# Patient Record
Sex: Female | Born: 1950
Health system: Southern US, Community
[De-identification: ages and names within clinical notes are randomized; demographics above are authoritative.]

## PROBLEM LIST (undated history)

## (undated) DIAGNOSIS — M1990 Unspecified osteoarthritis, unspecified site: Secondary | ICD-10-CM

## (undated) DIAGNOSIS — F32A Depression, unspecified: Secondary | ICD-10-CM

## (undated) DIAGNOSIS — I2510 Atherosclerotic heart disease of native coronary artery without angina pectoris: Secondary | ICD-10-CM

## (undated) DIAGNOSIS — H269 Unspecified cataract: Secondary | ICD-10-CM

## (undated) DIAGNOSIS — E785 Hyperlipidemia, unspecified: Secondary | ICD-10-CM

## (undated) DIAGNOSIS — Z5189 Encounter for other specified aftercare: Secondary | ICD-10-CM

## (undated) DIAGNOSIS — F329 Major depressive disorder, single episode, unspecified: Secondary | ICD-10-CM

## (undated) DIAGNOSIS — D509 Iron deficiency anemia, unspecified: Secondary | ICD-10-CM

## (undated) DIAGNOSIS — I10 Essential (primary) hypertension: Secondary | ICD-10-CM

## (undated) DIAGNOSIS — I341 Nonrheumatic mitral (valve) prolapse: Secondary | ICD-10-CM

## (undated) HISTORY — DX: Nonrheumatic mitral (valve) prolapse: I34.1

## (undated) HISTORY — DX: Iron deficiency anemia, unspecified: D50.9

## (undated) HISTORY — PX: PARTIAL HYSTERECTOMY: SHX80

## (undated) HISTORY — DX: Unspecified osteoarthritis, unspecified site: M19.90

## (undated) HISTORY — DX: Unspecified cataract: H26.9

## (undated) HISTORY — DX: Hyperlipidemia, unspecified: E78.5

## (undated) HISTORY — PX: ABDOMINAL HYSTERECTOMY: SHX81

## (undated) HISTORY — PX: COLONOSCOPY: SHX174

## (undated) HISTORY — DX: Essential (primary) hypertension: I10

## (undated) HISTORY — PX: CARDIAC CATHETERIZATION: SHX172

## (undated) HISTORY — DX: Encounter for other specified aftercare: Z51.89

## (undated) HISTORY — DX: Depression, unspecified: F32.A

## (undated) HISTORY — DX: Major depressive disorder, single episode, unspecified: F32.9

## (undated) HISTORY — PX: CATARACT EXTRACTION, BILATERAL: SHX1313

---

## 1998-03-08 ENCOUNTER — Ambulatory Visit (HOSPITAL_BASED_OUTPATIENT_CLINIC_OR_DEPARTMENT_OTHER): Admission: RE | Admit: 1998-03-08 | Discharge: 1998-03-08 | Payer: Self-pay | Admitting: Ophthalmology

## 1999-08-05 ENCOUNTER — Emergency Department (HOSPITAL_COMMUNITY): Admission: EM | Admit: 1999-08-05 | Discharge: 1999-08-05 | Payer: Self-pay | Admitting: Emergency Medicine

## 1999-08-06 ENCOUNTER — Encounter: Payer: Self-pay | Admitting: Emergency Medicine

## 1999-08-07 ENCOUNTER — Encounter: Admission: RE | Admit: 1999-08-07 | Discharge: 1999-08-07 | Payer: Self-pay | Admitting: Family Medicine

## 1999-10-29 ENCOUNTER — Encounter (INDEPENDENT_AMBULATORY_CARE_PROVIDER_SITE_OTHER): Payer: Self-pay | Admitting: *Deleted

## 1999-10-29 LAB — CONVERTED CEMR LAB

## 1999-11-13 ENCOUNTER — Encounter: Admission: RE | Admit: 1999-11-13 | Discharge: 1999-11-13 | Payer: Self-pay | Admitting: Family Medicine

## 1999-11-26 ENCOUNTER — Encounter: Admission: RE | Admit: 1999-11-26 | Discharge: 1999-11-26 | Payer: Self-pay | Admitting: *Deleted

## 1999-11-26 ENCOUNTER — Encounter: Payer: Self-pay | Admitting: *Deleted

## 2000-01-09 ENCOUNTER — Encounter: Admission: RE | Admit: 2000-01-09 | Discharge: 2000-01-09 | Payer: Self-pay | Admitting: *Deleted

## 2000-01-09 ENCOUNTER — Encounter: Payer: Self-pay | Admitting: *Deleted

## 2000-06-01 ENCOUNTER — Encounter: Admission: RE | Admit: 2000-06-01 | Discharge: 2000-06-01 | Payer: Self-pay | Admitting: Family Medicine

## 2000-06-24 ENCOUNTER — Encounter: Admission: RE | Admit: 2000-06-24 | Discharge: 2000-06-24 | Payer: Self-pay | Admitting: Family Medicine

## 2000-08-06 ENCOUNTER — Encounter: Admission: RE | Admit: 2000-08-06 | Discharge: 2000-08-06 | Payer: Self-pay | Admitting: Family Medicine

## 2002-08-18 ENCOUNTER — Encounter: Admission: RE | Admit: 2002-08-18 | Discharge: 2002-08-18 | Payer: Self-pay | Admitting: Family Medicine

## 2002-11-24 ENCOUNTER — Encounter: Admission: RE | Admit: 2002-11-24 | Discharge: 2002-11-24 | Payer: Self-pay | Admitting: Family Medicine

## 2003-08-08 ENCOUNTER — Encounter: Admission: RE | Admit: 2003-08-08 | Discharge: 2003-08-08 | Payer: Self-pay | Admitting: Family Medicine

## 2003-08-20 ENCOUNTER — Encounter: Admission: RE | Admit: 2003-08-20 | Discharge: 2003-08-20 | Payer: Self-pay | Admitting: Family Medicine

## 2004-04-14 ENCOUNTER — Ambulatory Visit: Payer: Self-pay | Admitting: Family Medicine

## 2004-05-21 ENCOUNTER — Ambulatory Visit: Payer: Self-pay | Admitting: Sports Medicine

## 2004-07-02 ENCOUNTER — Ambulatory Visit: Payer: Self-pay | Admitting: Family Medicine

## 2004-07-14 ENCOUNTER — Ambulatory Visit: Payer: Self-pay | Admitting: Sports Medicine

## 2004-10-02 ENCOUNTER — Ambulatory Visit (HOSPITAL_COMMUNITY): Admission: RE | Admit: 2004-10-02 | Discharge: 2004-10-02 | Payer: Self-pay | Admitting: Family Medicine

## 2004-12-23 ENCOUNTER — Ambulatory Visit (HOSPITAL_COMMUNITY): Admission: RE | Admit: 2004-12-23 | Discharge: 2004-12-23 | Payer: Self-pay | Admitting: Gastroenterology

## 2005-03-05 ENCOUNTER — Emergency Department (HOSPITAL_COMMUNITY): Admission: EM | Admit: 2005-03-05 | Discharge: 2005-03-05 | Payer: Self-pay | Admitting: Emergency Medicine

## 2005-03-09 ENCOUNTER — Ambulatory Visit: Payer: Self-pay | Admitting: Sports Medicine

## 2005-03-13 ENCOUNTER — Encounter: Admission: RE | Admit: 2005-03-13 | Discharge: 2005-06-11 | Payer: Self-pay | Admitting: Sports Medicine

## 2005-03-20 ENCOUNTER — Ambulatory Visit: Payer: Self-pay | Admitting: Sports Medicine

## 2005-04-08 ENCOUNTER — Ambulatory Visit: Payer: Self-pay | Admitting: Family Medicine

## 2005-06-05 ENCOUNTER — Ambulatory Visit: Payer: Self-pay | Admitting: Family Medicine

## 2005-06-06 ENCOUNTER — Ambulatory Visit (HOSPITAL_COMMUNITY): Admission: RE | Admit: 2005-06-06 | Discharge: 2005-06-06 | Payer: Self-pay | Admitting: Sports Medicine

## 2005-06-12 ENCOUNTER — Ambulatory Visit: Payer: Self-pay | Admitting: Sports Medicine

## 2005-08-04 ENCOUNTER — Ambulatory Visit: Payer: Self-pay | Admitting: Family Medicine

## 2005-11-17 ENCOUNTER — Ambulatory Visit: Payer: Self-pay | Admitting: Family Medicine

## 2006-01-14 ENCOUNTER — Ambulatory Visit: Payer: Self-pay | Admitting: Family Medicine

## 2006-01-15 ENCOUNTER — Encounter: Admission: RE | Admit: 2006-01-15 | Discharge: 2006-01-15 | Payer: Self-pay | Admitting: Sports Medicine

## 2006-01-20 ENCOUNTER — Ambulatory Visit: Payer: Self-pay | Admitting: Sports Medicine

## 2006-02-10 ENCOUNTER — Ambulatory Visit: Payer: Self-pay | Admitting: Sports Medicine

## 2006-02-24 ENCOUNTER — Ambulatory Visit: Payer: Self-pay | Admitting: Sports Medicine

## 2006-04-02 ENCOUNTER — Ambulatory Visit: Payer: Self-pay | Admitting: Family Medicine

## 2006-04-13 ENCOUNTER — Ambulatory Visit: Payer: Self-pay | Admitting: Family Medicine

## 2006-04-28 ENCOUNTER — Ambulatory Visit: Payer: Self-pay | Admitting: Family Medicine

## 2006-04-28 ENCOUNTER — Encounter (INDEPENDENT_AMBULATORY_CARE_PROVIDER_SITE_OTHER): Payer: Self-pay | Admitting: Family Medicine

## 2006-04-28 LAB — CONVERTED CEMR LAB
BUN: 14 mg/dL (ref 6–23)
Calcium: 9.5 mg/dL (ref 8.4–10.5)
Chloride: 103 meq/L (ref 96–112)
Creatinine, Ser: 0.95 mg/dL (ref 0.40–1.20)
HDL: 56 mg/dL (ref >39)
Potassium: 4.6 meq/L (ref 3.5–5.3)
Total CHOL/HDL Ratio: 4.4
Total CK: 105 units/L (ref 7–177)
Triglycerides: 122 mg/dL (ref <150)

## 2006-04-30 ENCOUNTER — Ambulatory Visit (HOSPITAL_COMMUNITY): Admission: RE | Admit: 2006-04-30 | Discharge: 2006-04-30 | Payer: Self-pay | Admitting: Sports Medicine

## 2006-05-28 ENCOUNTER — Encounter (INDEPENDENT_AMBULATORY_CARE_PROVIDER_SITE_OTHER): Payer: Self-pay | Admitting: *Deleted

## 2006-06-10 ENCOUNTER — Encounter: Admission: RE | Admit: 2006-06-10 | Discharge: 2006-06-10 | Payer: Self-pay | Admitting: Sports Medicine

## 2006-06-16 ENCOUNTER — Encounter (INDEPENDENT_AMBULATORY_CARE_PROVIDER_SITE_OTHER): Payer: Self-pay | Admitting: Family Medicine

## 2006-06-24 ENCOUNTER — Ambulatory Visit: Payer: Self-pay | Admitting: Sports Medicine

## 2006-06-24 DIAGNOSIS — E7800 Pure hypercholesterolemia, unspecified: Secondary | ICD-10-CM

## 2006-06-24 DIAGNOSIS — I10 Essential (primary) hypertension: Secondary | ICD-10-CM | POA: Insufficient documentation

## 2006-07-13 ENCOUNTER — Telehealth: Payer: Self-pay | Admitting: *Deleted

## 2006-07-15 ENCOUNTER — Encounter (INDEPENDENT_AMBULATORY_CARE_PROVIDER_SITE_OTHER): Payer: Self-pay | Admitting: Family Medicine

## 2006-07-20 ENCOUNTER — Telehealth: Payer: Self-pay | Admitting: *Deleted

## 2006-07-20 ENCOUNTER — Encounter (INDEPENDENT_AMBULATORY_CARE_PROVIDER_SITE_OTHER): Payer: Self-pay | Admitting: Family Medicine

## 2006-07-22 ENCOUNTER — Telehealth (INDEPENDENT_AMBULATORY_CARE_PROVIDER_SITE_OTHER): Payer: Self-pay | Admitting: *Deleted

## 2006-08-04 ENCOUNTER — Encounter (INDEPENDENT_AMBULATORY_CARE_PROVIDER_SITE_OTHER): Payer: Self-pay | Admitting: Family Medicine

## 2006-08-24 ENCOUNTER — Telehealth (INDEPENDENT_AMBULATORY_CARE_PROVIDER_SITE_OTHER): Payer: Self-pay | Admitting: *Deleted

## 2006-08-30 ENCOUNTER — Ambulatory Visit (HOSPITAL_COMMUNITY): Admission: RE | Admit: 2006-08-30 | Discharge: 2006-08-30 | Payer: Self-pay | Admitting: Family Medicine

## 2006-08-30 ENCOUNTER — Encounter (INDEPENDENT_AMBULATORY_CARE_PROVIDER_SITE_OTHER): Payer: Self-pay | Admitting: Family Medicine

## 2006-08-30 ENCOUNTER — Ambulatory Visit: Payer: Self-pay | Admitting: Sports Medicine

## 2006-08-30 DIAGNOSIS — K219 Gastro-esophageal reflux disease without esophagitis: Secondary | ICD-10-CM

## 2006-08-30 LAB — CONVERTED CEMR LAB
Direct LDL: 188 mg/dL — ABNORMAL HIGH
Ferritin: 108 ng/mL (ref 10–291)

## 2006-08-31 ENCOUNTER — Encounter (INDEPENDENT_AMBULATORY_CARE_PROVIDER_SITE_OTHER): Payer: Self-pay | Admitting: Family Medicine

## 2006-09-02 ENCOUNTER — Encounter (INDEPENDENT_AMBULATORY_CARE_PROVIDER_SITE_OTHER): Payer: Self-pay | Admitting: Family Medicine

## 2006-12-27 ENCOUNTER — Encounter (INDEPENDENT_AMBULATORY_CARE_PROVIDER_SITE_OTHER): Payer: Self-pay | Admitting: Family Medicine

## 2006-12-27 ENCOUNTER — Ambulatory Visit: Payer: Self-pay | Admitting: Family Medicine

## 2006-12-27 ENCOUNTER — Telehealth: Payer: Self-pay | Admitting: *Deleted

## 2006-12-27 LAB — CONVERTED CEMR LAB
ALT: 13 units/L (ref 0–35)
AST: 11 units/L (ref 0–37)
Albumin: 4 g/dL (ref 3.5–5.2)
Alkaline Phosphatase: 69 units/L (ref 39–117)
BUN: 16 mg/dL (ref 6–23)
CO2: 23 meq/L (ref 19–32)
Calcium: 9.1 mg/dL (ref 8.4–10.5)
Chloride: 106 meq/L (ref 96–112)
Creatinine, Ser: 0.89 mg/dL (ref 0.40–1.20)
Glucose, Bld: 98 mg/dL (ref 70–99)
HCT: 36.7 % (ref 36.0–46.0)
Hemoglobin: 11.6 g/dL — ABNORMAL LOW (ref 12.0–15.0)
MCHC: 31.6 g/dL (ref 30.0–36.0)
MCV: 69.1 fL — ABNORMAL LOW (ref 78.0–100.0)
Platelets: 444 10*3/uL — ABNORMAL HIGH (ref 150–400)
Potassium: 4.1 meq/L (ref 3.5–5.3)
RBC: 5.31 M/uL — ABNORMAL HIGH (ref 3.87–5.11)
RDW: 16.3 % — ABNORMAL HIGH (ref 11.5–14.0)
Sodium: 140 meq/L (ref 135–145)
Total Bilirubin: 0.4 mg/dL (ref 0.3–1.2)
Total Protein: 6.9 g/dL (ref 6.0–8.3)
WBC: 9.8 10*3/uL (ref 4.0–10.5)

## 2007-01-06 ENCOUNTER — Telehealth: Payer: Self-pay | Admitting: *Deleted

## 2007-01-06 ENCOUNTER — Encounter (INDEPENDENT_AMBULATORY_CARE_PROVIDER_SITE_OTHER): Payer: Self-pay | Admitting: Family Medicine

## 2007-01-17 ENCOUNTER — Telehealth: Payer: Self-pay | Admitting: *Deleted

## 2007-01-18 ENCOUNTER — Ambulatory Visit: Payer: Self-pay | Admitting: Family Medicine

## 2007-01-18 DIAGNOSIS — M549 Dorsalgia, unspecified: Secondary | ICD-10-CM | POA: Insufficient documentation

## 2007-01-20 ENCOUNTER — Telehealth (INDEPENDENT_AMBULATORY_CARE_PROVIDER_SITE_OTHER): Payer: Self-pay | Admitting: Family Medicine

## 2007-02-16 ENCOUNTER — Ambulatory Visit (HOSPITAL_COMMUNITY): Admission: RE | Admit: 2007-02-16 | Discharge: 2007-02-16 | Payer: Self-pay | Admitting: Gastroenterology

## 2007-03-08 ENCOUNTER — Ambulatory Visit: Payer: Self-pay | Admitting: Family Medicine

## 2007-03-08 LAB — CONVERTED CEMR LAB
Glucose, Urine, Semiquant: NEGATIVE
Nitrite: NEGATIVE
Specific Gravity, Urine: 1.025
Urobilinogen, UA: 0.2
pH: 5.5

## 2007-04-06 ENCOUNTER — Encounter (INDEPENDENT_AMBULATORY_CARE_PROVIDER_SITE_OTHER): Payer: Self-pay | Admitting: Family Medicine

## 2007-04-15 ENCOUNTER — Telehealth (INDEPENDENT_AMBULATORY_CARE_PROVIDER_SITE_OTHER): Payer: Self-pay | Admitting: Family Medicine

## 2007-07-26 ENCOUNTER — Ambulatory Visit: Payer: Self-pay | Admitting: Family Medicine

## 2007-08-10 ENCOUNTER — Emergency Department (HOSPITAL_COMMUNITY): Admission: EM | Admit: 2007-08-10 | Discharge: 2007-08-10 | Payer: Self-pay | Admitting: Family Medicine

## 2007-08-10 ENCOUNTER — Telehealth (INDEPENDENT_AMBULATORY_CARE_PROVIDER_SITE_OTHER): Payer: Self-pay | Admitting: *Deleted

## 2007-08-16 ENCOUNTER — Telehealth: Payer: Self-pay | Admitting: *Deleted

## 2007-08-17 ENCOUNTER — Ambulatory Visit: Payer: Self-pay | Admitting: Family Medicine

## 2007-08-24 ENCOUNTER — Encounter: Payer: Self-pay | Admitting: *Deleted

## 2007-08-25 ENCOUNTER — Encounter (INDEPENDENT_AMBULATORY_CARE_PROVIDER_SITE_OTHER): Payer: Self-pay | Admitting: Family Medicine

## 2007-08-25 ENCOUNTER — Ambulatory Visit: Payer: Self-pay | Admitting: Family Medicine

## 2007-08-25 LAB — CONVERTED CEMR LAB
Glucose, Urine, Semiquant: NEGATIVE
Nitrite: NEGATIVE
OCCULT 1: NEGATIVE
OCCULT 3: NEGATIVE
pH: 5

## 2007-10-17 ENCOUNTER — Telehealth: Payer: Self-pay | Admitting: *Deleted

## 2007-10-18 ENCOUNTER — Ambulatory Visit: Payer: Self-pay | Admitting: Family Medicine

## 2007-10-18 ENCOUNTER — Encounter: Payer: Self-pay | Admitting: Family Medicine

## 2007-12-08 ENCOUNTER — Telehealth: Payer: Self-pay | Admitting: *Deleted

## 2007-12-09 ENCOUNTER — Ambulatory Visit: Payer: Self-pay | Admitting: Family Medicine

## 2008-01-04 ENCOUNTER — Encounter: Payer: Self-pay | Admitting: Family Medicine

## 2008-01-04 ENCOUNTER — Ambulatory Visit: Payer: Self-pay | Admitting: Family Medicine

## 2008-01-04 DIAGNOSIS — E669 Obesity, unspecified: Secondary | ICD-10-CM

## 2008-01-04 LAB — CONVERTED CEMR LAB
HDL goal, serum: 40 mg/dL
Pap Smear: NORMAL
VLDL: 19 mg/dL (ref 0–40)

## 2008-01-06 ENCOUNTER — Telehealth: Payer: Self-pay | Admitting: Family Medicine

## 2008-01-17 ENCOUNTER — Telehealth (INDEPENDENT_AMBULATORY_CARE_PROVIDER_SITE_OTHER): Payer: Self-pay | Admitting: *Deleted

## 2008-01-24 ENCOUNTER — Encounter: Admission: RE | Admit: 2008-01-24 | Discharge: 2008-01-24 | Payer: Self-pay | Admitting: Family Medicine

## 2008-01-25 ENCOUNTER — Ambulatory Visit: Payer: Self-pay | Admitting: Family Medicine

## 2008-01-25 DIAGNOSIS — F329 Major depressive disorder, single episode, unspecified: Secondary | ICD-10-CM | POA: Insufficient documentation

## 2008-01-26 ENCOUNTER — Ambulatory Visit: Payer: Self-pay | Admitting: Sports Medicine

## 2008-01-26 DIAGNOSIS — G5600 Carpal tunnel syndrome, unspecified upper limb: Secondary | ICD-10-CM | POA: Insufficient documentation

## 2008-04-23 ENCOUNTER — Telehealth: Payer: Self-pay | Admitting: Family Medicine

## 2008-04-24 ENCOUNTER — Ambulatory Visit (HOSPITAL_COMMUNITY): Admission: RE | Admit: 2008-04-24 | Discharge: 2008-04-24 | Payer: Self-pay | Admitting: Family Medicine

## 2008-04-24 ENCOUNTER — Ambulatory Visit: Payer: Self-pay | Admitting: Family Medicine

## 2008-04-24 ENCOUNTER — Encounter (INDEPENDENT_AMBULATORY_CARE_PROVIDER_SITE_OTHER): Payer: Self-pay | Admitting: Family Medicine

## 2008-04-24 LAB — CONVERTED CEMR LAB
ALT: 15 units/L (ref 0–35)
AST: 12 units/L (ref 0–37)
CO2: 21 meq/L (ref 19–32)
Chloride: 106 meq/L (ref 96–112)
Creatinine, Ser: 0.96 mg/dL (ref 0.40–1.20)
Hemoglobin: 11.9 g/dL — ABNORMAL LOW (ref 12.0–15.0)
MCHC: 30.5 g/dL (ref 30.0–36.0)
Platelets: 423 10*3/uL — ABNORMAL HIGH (ref 150–400)
Potassium: 4.4 meq/L (ref 3.5–5.3)
RBC: 5.6 M/uL — ABNORMAL HIGH (ref 3.87–5.11)
Total Bilirubin: 0.4 mg/dL (ref 0.3–1.2)

## 2008-04-27 ENCOUNTER — Encounter: Payer: Self-pay | Admitting: *Deleted

## 2008-04-30 ENCOUNTER — Encounter (INDEPENDENT_AMBULATORY_CARE_PROVIDER_SITE_OTHER): Payer: Self-pay | Admitting: Family Medicine

## 2008-05-03 ENCOUNTER — Ambulatory Visit (HOSPITAL_COMMUNITY): Admission: RE | Admit: 2008-05-03 | Discharge: 2008-05-03 | Payer: Self-pay | Admitting: Family Medicine

## 2008-05-03 ENCOUNTER — Ambulatory Visit: Payer: Self-pay | Admitting: *Deleted

## 2008-05-03 ENCOUNTER — Encounter (INDEPENDENT_AMBULATORY_CARE_PROVIDER_SITE_OTHER): Payer: Self-pay | Admitting: Family Medicine

## 2008-06-05 ENCOUNTER — Ambulatory Visit: Payer: Self-pay | Admitting: Family Medicine

## 2008-09-12 ENCOUNTER — Ambulatory Visit: Payer: Self-pay | Admitting: Family Medicine

## 2008-09-12 ENCOUNTER — Ambulatory Visit: Payer: Self-pay | Admitting: Sports Medicine

## 2008-09-14 ENCOUNTER — Telehealth: Payer: Self-pay | Admitting: Family Medicine

## 2008-09-19 ENCOUNTER — Ambulatory Visit: Payer: Self-pay | Admitting: Family Medicine

## 2008-11-15 ENCOUNTER — Ambulatory Visit: Payer: Self-pay | Admitting: Family Medicine

## 2008-11-15 ENCOUNTER — Encounter: Payer: Self-pay | Admitting: Family Medicine

## 2009-01-24 ENCOUNTER — Telehealth: Payer: Self-pay | Admitting: Family Medicine

## 2009-01-24 ENCOUNTER — Ambulatory Visit: Payer: Self-pay | Admitting: Family Medicine

## 2009-02-07 ENCOUNTER — Telehealth: Payer: Self-pay | Admitting: Family Medicine

## 2009-02-08 ENCOUNTER — Ambulatory Visit: Payer: Self-pay | Admitting: Family Medicine

## 2009-02-08 ENCOUNTER — Encounter: Payer: Self-pay | Admitting: Family Medicine

## 2009-02-08 LAB — CONVERTED CEMR LAB: Chlamydia, DNA Probe: NEGATIVE

## 2009-02-11 ENCOUNTER — Encounter: Payer: Self-pay | Admitting: Family Medicine

## 2009-02-20 ENCOUNTER — Ambulatory Visit: Payer: Self-pay | Admitting: Family Medicine

## 2009-03-07 ENCOUNTER — Telehealth: Payer: Self-pay | Admitting: Family Medicine

## 2009-03-08 ENCOUNTER — Ambulatory Visit: Payer: Self-pay | Admitting: Family Medicine

## 2009-03-08 ENCOUNTER — Ambulatory Visit (HOSPITAL_COMMUNITY): Admission: RE | Admit: 2009-03-08 | Discharge: 2009-03-08 | Payer: Self-pay | Admitting: Family Medicine

## 2009-03-12 ENCOUNTER — Encounter: Payer: Self-pay | Admitting: Family Medicine

## 2009-03-19 ENCOUNTER — Telehealth (INDEPENDENT_AMBULATORY_CARE_PROVIDER_SITE_OTHER): Payer: Self-pay | Admitting: Family Medicine

## 2009-04-11 ENCOUNTER — Ambulatory Visit (HOSPITAL_COMMUNITY): Admission: RE | Admit: 2009-04-11 | Discharge: 2009-04-11 | Payer: Self-pay | Admitting: Family Medicine

## 2009-04-11 ENCOUNTER — Encounter: Payer: Self-pay | Admitting: Family Medicine

## 2009-04-11 ENCOUNTER — Ambulatory Visit: Payer: Self-pay | Admitting: Family Medicine

## 2009-04-15 ENCOUNTER — Ambulatory Visit: Payer: Self-pay | Admitting: Cardiology

## 2009-04-15 DIAGNOSIS — I059 Rheumatic mitral valve disease, unspecified: Secondary | ICD-10-CM | POA: Insufficient documentation

## 2009-04-25 ENCOUNTER — Ambulatory Visit: Payer: Self-pay | Admitting: Family Medicine

## 2009-05-03 ENCOUNTER — Ambulatory Visit (HOSPITAL_COMMUNITY): Admission: RE | Admit: 2009-05-03 | Discharge: 2009-05-03 | Payer: Self-pay | Admitting: Cardiology

## 2009-05-03 ENCOUNTER — Encounter: Payer: Self-pay | Admitting: Cardiology

## 2009-05-03 ENCOUNTER — Ambulatory Visit: Payer: Self-pay | Admitting: Cardiovascular Disease

## 2009-05-03 ENCOUNTER — Ambulatory Visit: Payer: Self-pay

## 2009-05-08 ENCOUNTER — Encounter: Payer: Self-pay | Admitting: Family Medicine

## 2009-05-24 ENCOUNTER — Telehealth: Payer: Self-pay | Admitting: Family Medicine

## 2009-05-31 ENCOUNTER — Ambulatory Visit: Payer: Self-pay | Admitting: Cardiology

## 2009-05-31 ENCOUNTER — Encounter: Payer: Self-pay | Admitting: Family Medicine

## 2009-05-31 ENCOUNTER — Ambulatory Visit: Payer: Self-pay | Admitting: Family Medicine

## 2009-05-31 DIAGNOSIS — F329 Major depressive disorder, single episode, unspecified: Secondary | ICD-10-CM

## 2009-05-31 DIAGNOSIS — F419 Anxiety disorder, unspecified: Secondary | ICD-10-CM

## 2009-06-20 ENCOUNTER — Encounter: Payer: Self-pay | Admitting: Family Medicine

## 2009-06-26 ENCOUNTER — Telehealth (INDEPENDENT_AMBULATORY_CARE_PROVIDER_SITE_OTHER): Payer: Self-pay | Admitting: Family Medicine

## 2009-07-12 ENCOUNTER — Ambulatory Visit: Payer: Self-pay | Admitting: Family Medicine

## 2009-07-12 ENCOUNTER — Encounter: Payer: Self-pay | Admitting: Family Medicine

## 2009-07-15 LAB — CONVERTED CEMR LAB
ALT: 15 units/L (ref 0–35)
Alkaline Phosphatase: 55 units/L (ref 39–117)
BUN: 17 mg/dL (ref 6–23)
Creatinine, Ser: 1.02 mg/dL (ref 0.40–1.20)
Ferritin: 183 ng/mL (ref 10–291)
Glucose, Bld: 111 mg/dL — ABNORMAL HIGH (ref 70–99)
HCT: 38.6 % (ref 36.0–46.0)
HDL: 57 mg/dL (ref >39)
Hemoglobin: 12.2 g/dL (ref 12.0–15.0)
Iron: 81 ug/dL (ref 42–145)
LDL Cholesterol: 181 mg/dL — ABNORMAL HIGH (ref 0–99)
Platelets: 378 10*3/uL (ref 150–400)
Saturation Ratios: 23 % (ref 20–55)
Total CHOL/HDL Ratio: 4.6
Total Protein: 7.1 g/dL (ref 6.0–8.3)
UIBC: 277 ug/dL
WBC: 7.3 10*3/uL (ref 4.0–10.5)

## 2009-07-23 ENCOUNTER — Encounter: Payer: Self-pay | Admitting: Family Medicine

## 2009-08-14 ENCOUNTER — Ambulatory Visit: Payer: Self-pay | Admitting: Family Medicine

## 2009-09-09 ENCOUNTER — Ambulatory Visit: Payer: Self-pay | Admitting: Family Medicine

## 2009-09-23 ENCOUNTER — Telehealth: Payer: Self-pay | Admitting: *Deleted

## 2009-09-23 ENCOUNTER — Telehealth: Payer: Self-pay | Admitting: Cardiology

## 2009-10-07 ENCOUNTER — Ambulatory Visit: Payer: Self-pay | Admitting: Family Medicine

## 2009-10-07 ENCOUNTER — Encounter: Payer: Self-pay | Admitting: Family Medicine

## 2009-10-07 LAB — CONVERTED CEMR LAB
ALT: 12 units/L (ref 0–35)
AST: 11 units/L (ref 0–37)
Albumin: 4.3 g/dL (ref 3.5–5.2)
Alkaline Phosphatase: 58 units/L (ref 39–117)
Direct LDL: 128 mg/dL — ABNORMAL HIGH
Indirect Bilirubin: 0.5 mg/dL (ref 0.0–0.9)
Total Bilirubin: 0.6 mg/dL (ref 0.3–1.2)
Total Protein: 7.2 g/dL (ref 6.0–8.3)

## 2009-11-05 ENCOUNTER — Encounter: Payer: Self-pay | Admitting: Family Medicine

## 2009-12-03 ENCOUNTER — Telehealth (INDEPENDENT_AMBULATORY_CARE_PROVIDER_SITE_OTHER): Payer: Self-pay | Admitting: *Deleted

## 2010-01-01 ENCOUNTER — Ambulatory Visit: Payer: Self-pay | Admitting: Family Medicine

## 2010-01-01 ENCOUNTER — Encounter: Payer: Self-pay | Admitting: Family Medicine

## 2010-01-02 ENCOUNTER — Encounter: Payer: Self-pay | Admitting: Family Medicine

## 2010-01-02 LAB — CONVERTED CEMR LAB
ALT: 13 units/L (ref 0–35)
Alkaline Phosphatase: 49 units/L (ref 39–117)
Calcium: 9.5 mg/dL (ref 8.4–10.5)
Creatinine, Ser: 0.99 mg/dL (ref 0.40–1.20)

## 2010-01-22 ENCOUNTER — Telehealth: Payer: Self-pay | Admitting: Family Medicine

## 2010-02-05 ENCOUNTER — Ambulatory Visit: Payer: Self-pay | Admitting: Family Medicine

## 2010-02-10 ENCOUNTER — Ambulatory Visit: Payer: Self-pay | Admitting: Sports Medicine

## 2010-02-10 ENCOUNTER — Encounter: Admission: RE | Admit: 2010-02-10 | Discharge: 2010-02-10 | Payer: Self-pay | Admitting: *Deleted

## 2010-02-10 DIAGNOSIS — M79609 Pain in unspecified limb: Secondary | ICD-10-CM

## 2010-02-17 ENCOUNTER — Ambulatory Visit: Payer: Self-pay | Admitting: Family Medicine

## 2010-02-18 ENCOUNTER — Telehealth: Payer: Self-pay | Admitting: Family Medicine

## 2010-02-27 ENCOUNTER — Encounter: Payer: Self-pay | Admitting: Family Medicine

## 2010-03-18 ENCOUNTER — Telehealth: Payer: Self-pay | Admitting: Family Medicine

## 2010-05-01 NOTE — Assessment & Plan Note (Signed)
Summary: ETT at hospital on 04/11/09 /ls   Allergies: No Known Drug Allergies   Complete Medication List: 1)  Ferrous Sulfate 325 (65 Fe) Mg Tabs (Ferrous sulfate) .... Take 1 tablet by mouth bid 2)  Fluticasone Propionate 50 Mcg/act Susp (Fluticasone propionate) .... Spray 1-2 spray into both nostrils once a day 3)  Omeprazole 20 Mg Cpdr (Omeprazole) .... One tab by mouth daily 4)  Ultram 50 Mg Tabs (Tramadol hcl) .... One tab by mouth q4-q6 as needed pain 5)  Simvastatin 80 Mg Tabs (Simvastatin) .[name].. 1 tab by mouth once daily 6)  Sertraline Hcl 100 Mg Tabs (Sertraline hcl) .... One tab by mouth daily 7)  Hydrochlorothiazide 12.5 Mg Caps (Hydrochlorothiazide) .... One half tab by mouth three times a week. 8)  Hydroxyzine Hcl 50 Mg Tabs (Hydroxyzine hcl) .... One half tab to one tab by mouth at bedtime as needed trouble sleeping 9)  Aspirin 81 Mg Tbec (Aspirin) .... One tab by mouth daily 10)  Nitrostat 0.4 Mg Subl (Nitroglycerin) .... Place one tablet under your tongue if you develop chest pain. you can repeat every 5 minutes for a total of 3 tablets.  Other Orders: ETT w/ interpretation and report- [facility] [phone])

## 2010-05-01 NOTE — Progress Notes (Signed)
Summary: c/o pressure in chest dry mouth  Phone Note Call from Patient Call back at Home Phone [phone] Call back at [phone]   Caller: Patient Reason for Call: Talk to Nurse Summary of Call: per pt calling c/o dry mouth, pressure in chest. b/p today 155/93 @ 10a.m. @ 6:40a.m.144/95. pt had about 6 flutter epsidoe since this am. no nitro was not taken. Initial call taken by: Lorne Skeens,  September 23, 2009 12:04 PM  Follow-up for Phone Call        spoke with pt, she had one episode yesterday and has had seven episodes today of a tightness in her chest that goes up into her throat. it happens at rest or with exerction and last for about 10sec. she denies any other symptoms but c/o dry mouth and hot flashes. she is unable to reproduce the pain with movement or palpation. she took a muscle relaxer yesterday  and that provided relief. she is pain free at present. she also states her bp is up during these episodes will discuss with dr Jens Som Deliah Goody, RN  September 23, 2009 12:26 PM   Additional Follow-up for Phone Call Additional follow up Details #1::        pain atypical; if persists, can be seen in ER or F/U with her primary care Ferman Hamming, MD, [facility]  September 23, 2009 1:41 PM  spoke with pt, she has had no further episodes of tightness today. she took her anxiety pill and feels like that has helped. she will go to the ER or primary md if occurs again Deliah Goody, RN  September 23, 2009 3:05 PM

## 2010-05-01 NOTE — Assessment & Plan Note (Signed)
Summary: refill meds,df   Vital Signs:  Patient profile:   60 year old female Weight:      172.9 pounds Temp:     98.3 degrees F oral Pulse rate:   69 / minute Pulse rhythm:   regular BP sitting:   125 / 73  (left arm) Cuff size:   regular  Vitals Entered By: Loralee Pacas CMA (January 01, 2010 2:44 PM) CC: med refills   Primary Care Ellanore Vanhook:  Edd Arbour  CC:  med refills.  History of Present Illness: 60 yo here to follow-up   states she missed appointment with her PCP and is here for med refills until she can see him.  She inquires about getting refilled on clonazepam and flexeril specifically.     Symtpoms she is concerned with today are anxiety, and hot flashes, both chronic issues.  Detailed med rec performed.   Habits & Providers  Alcohol-Tobacco-Diet     Alcohol drinks/day: <1     Alcohol type: beer     Tobacco Status: quit > 6 months     Tobacco Counseling: to remain off tobacco products     Year Quit: 2005  Current Medications (verified): 1)  Ferrous Sulfate 325 (65 Fe) Mg Tabs (Ferrous Sulfate) .... Take 1 Tablet By Mouth Two Times A Day For Anemia/low Blood Count 2)  Fluticasone Propionate 50 Mcg/act Susp (Fluticasone Propionate) .... Spray 1-2 Spray Into Both Nostrils Once A Day For Allergies 3)  Omeprazole 20 Mg Cpdr (Omeprazole) .... One Tab By Mouth Daily For Reflux/heartburn 4)  Simvastatin 40 Mg Tabs (Simvastatin) .... One Q Hs 5)  Citalopram Hydrobromide 40 Mg Tabs (Citalopram Hydrobromide) .... One Half Tablet Daily For 1 Week, Then One Tablet Daily 6)  Aspirin 81 Mg Tbec (Aspirin) .... One Tab By Mouth Daily To Help Protect Your Heart 7)  Hydrochlorothiazide 12.5 Mg  Tabs (Hydrochlorothiazide) .... Take 1 Tab  By Mouth Every Morning 8)  Hydroxyzine Hcl 25 Mg Tabs (Hydroxyzine Hcl) .... Take One To Two Tabs At Bedtime As Needed Sleep  Allergies: No Known Drug Allergies PMH-FH-SH reviewed for relevance  Review of Systems      See  HPI  Physical Exam  General:  alert and well-developed.   Lungs:  normal respiratory effort.  Unlabored breathing. Heart:  Normal rate and regular rhythm. S1 and S2 normal without gallop, murmur, click, rub or other extra sounds. Extremities:  no LE edema   Impression & Recommendations:  Problem # 1:  ESSENTIAL HYPERTENSION (ICD-401.9)  She states she is taking HCTZ daily.  Was not a diagnosis on her problem list but I have added it.  BP good today. COntinue to monitor  Her updated medication list for this problem includes:    Hydrochlorothiazide 12.5 Mg Tabs (Hydrochlorothiazide) .[name]... Take 1 tab  by mouth every morning  BP today: 125/73 Prior BP: 140/75 (10/07/2009)  Prior 10 Yr Risk Heart Disease: 13 % (01/04/2008)  Labs Reviewed: K+: 3.8 (07/12/2009) Creat: : 1.02 (07/12/2009)   Chol: 263 (07/12/2009)   HDL: 57 (07/12/2009)   LDL: 181 (07/12/2009)   TG: 127 (07/12/2009)  Orders: [facility]- Est  Level 4 (29562)  Problem # 2:  HYPERCHOLESTEROLEMIA (ICD-272.0) Patient has been taking 40 mg of simvastatin for 3 months now due to patient concern over 80mg  doseing.  Will recheck Direct LDL today and patient to discuss further changes with PCP.  Her updated medication list for this problem includes:    Simvastatin 40 Mg Tabs (  Simvastatin) ..... One q hs  Orders: Direct LDL-[facility] ([address]41-32440) Comp Met-[facility] (10272-53664) [facility]- Est  Level 4 (40347)  Problem # 3:  ANXIETY (ICD-300.00)  Has previously been taking for paxil and citalop[ram at a previous office visit.  Now nto even taking citalopram.  Advised to restart citalopram as this will help with anxiety and hot flashes.  told her i did not recommend restarting clonazepam.  The following medications were removed from the medication list:    Clonazepam 1 Mg Tabs (Clonazepam) ..... One tab by mouth daily as needed for anxiety Her updated medication list for this problem includes:    Citalopram Hydrobromide 40 Mg Tabs (Citalopram  hydrobromide) ..... One half tablet daily for 1 week, then one tablet daily    Hydroxyzine Hcl 25 Mg Tabs (Hydroxyzine hcl) .[name]... Take one to two tabs at bedtime as needed sleep  Orders: [facility]- Est  Level 4 (42595)  Problem # 4:  ANEMIA, MICROCYTIC (ICD-281.9) normal at last visit.Lovenia Shuck iron once a day intermittantly  Her updated medication list for this problem includes:    Ferrous Sulfate 325 (65 Fe) Mg Tabs (Ferrous sulfate) .[name]... Take 1 tablet by mouth two times a day for anemia/low blood count  Complete Medication List: 1)  Ferrous Sulfate 325 (65 Fe) Mg Tabs (Ferrous sulfate) .... Take 1 tablet by mouth two times a day for anemia/low blood count 2)  Fluticasone Propionate 50 Mcg/act Susp (Fluticasone propionate) .... Spray 1-2 spray into both nostrils once a day for allergies 3)  Omeprazole 20 Mg Cpdr (Omeprazole) .... One tab by mouth daily for reflux/heartburn 4)  Simvastatin 40 Mg Tabs (Simvastatin) .... One q hs 5)  Citalopram Hydrobromide 40 Mg Tabs (Citalopram hydrobromide) .... One half tablet daily for 1 week, then one tablet daily 6)  Aspirin 81 Mg Tbec (Aspirin) .... One tab by mouth daily to help protect your heart 7)  Hydrochlorothiazide 12.5 Mg Tabs (Hydrochlorothiazide) .... Take 1 tab  by mouth every morning 8)  Hydroxyzine Hcl 25 Mg Tabs (Hydroxyzine hcl) .... Take one to two tabs at bedtime as needed sleep  Patient Instructions: 1)  I have refilled your citalopram- do not run out of this medicine- it will help with your anxiety and may have some effect on your hot flashes. 2)  I do not recommend clonazepam or flexeril for regular use. 3)  We will recheck your cholesterol today and you can discuss this with yoru PCP at your visit next month. Prescriptions: CITALOPRAM HYDROBROMIDE 40 MG TABS (CITALOPRAM HYDROBROMIDE) one half tablet daily for 1 week, then one tablet daily  #30 x 3   Entered and Authorized by:   Delbert Harness MD   Signed by:   Delbert Harness MD on  01/01/2010   Method used:   [facility]  [address] [phone]* (retail)       [address]       [place], [place]  [address]       Ph: [phone] or [phone]       Fax: [phone]   RxID:   [id number]

## 2010-05-01 NOTE — Miscellaneous (Signed)
Summary: Exercise Treadmill testing  Clinical Lists Changes Exercise Treadmill Test today at [facility] Assessment: Adequate test       Positive test                           Excessive BP rise with exertion Plan: Recommend CAD confirmatory testing with Cardiology.  Consider cardiology consult. Consider intensification of antihypertensive regiment.  Cyrilla Durkin MD  April 11, 2009 1:29 PM    Orders: Added new Test order of ETT (ETT) - Signed Observations: Added new observation of ETTFINDING: INDICATIONS FOR  EST: Chest discomfort Results: Adequate test ST depression 2 to 2.5 mm in V4, V5, V6 No ischemic symptoms during test ST depression resolved within 1 minute of recovery Excess diastolic BP response to exercise c/w inadequate antihypertensive therapy. (04/11/2009 13:26)      Exercise Stress Test  Procedure date:  04/11/2009  Findings:      INDICATIONS FOR  EST: Chest discomfort Results: Adequate test ST depression 2 to 2.5 mm in V4, V5, V6 No ischemic symptoms during test ST depression resolved within 1 minute of recovery Excess diastolic BP response to exercise c/w inadequate antihypertensive therapy.   Exercise Stress Test  Procedure date:  04/11/2009  Findings:      INDICATIONS FOR  EST: Chest discomfort Results: Adequate test ST depression 2 to 2.5 mm in V4, V5, V6 No ischemic symptoms during test ST depression resolved within 1 minute of recovery Excess diastolic BP response to exercise c/w inadequate antihypertensive therapy.
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
## 2010-05-01 NOTE — Miscellaneous (Signed)
Summary: Hydroxyzine for sleep  Clinical Lists Changes  Medications: Added new medication of HYDROXYZINE HCL 25 MG TABS (HYDROXYZINE HCL) Take one to two tabs at bedtime as needed sleep - Signed Rx of HYDROXYZINE HCL 25 MG TABS (HYDROXYZINE HCL) Take one to two tabs at bedtime as needed sleep;  #60 x 2;  Signed;  Entered by: Zachery Dauer MD;  Authorized by: Zachery Dauer MD;  Method used: [facility]  [address] [phone]*, [address] [place], [place], [place]  [address], Ph: [phone] or [phone], Fax: [phone] Will prescribe, but side effects should be discussed as she gets older   Prescriptions: HYDROXYZINE HCL 25 MG TABS (HYDROXYZINE HCL) Take one to two tabs at bedtime as needed sleep  #60 x 2   Entered and Authorized by:   Zachery Dauer MD   Signed by:   Zachery Dauer MD on 11/05/2009   Method used:   Electronically to        [facility]  [address] [phone]* (retail)       [address]       [place], [place]  [address]       Ph: [phone] or [phone]       Fax: [phone]   RxID:   [id number]

## 2010-05-01 NOTE — Assessment & Plan Note (Signed)
Summary: anxiety attack,tcb   Vital Signs:  Patient profile:   60 year old female Height:      62 inches Weight:      173.6 pounds BMI:     31.87 Temp:     98.5 degrees F oral Pulse rate:   77 / minute BP sitting:   118 / 65  (left arm) Cuff size:   regular  Vitals Entered By: Garen Grams LPN (Aug 14, 2009 2:04 PM) CC: frequent anxiety attacks Is Patient Diabetic? Yes Did you bring your meter with you today? No Pain Assessment Patient in pain? yes     Location: rt elbow   Primary Care Provider:  Lequita Asal  MD  CC:  frequent anxiety attacks.  History of Present Illness: anxiety: patient reports that on day of clinic visit that she woke up with a "funny feeling" that started in chest and moves to her neck.  the feeling was stifling and happened a few times during the day.  the frequency is what concerned the patient.  the symptoms are described as being similar to her normal anxiety attacks.  patient without irritation or feelings of dread on day of presentation.  patient also without suicidal or homicidal ideation.  patient reports feelings of as though she could faint.  also reports a floating sensation as well.  patient felt sluggish and clumsy after episode.  patient denies chest pain or dizziness.  patient reports that she possibly hadn't taken her citalopram.  benzo use has decreased.  Habits & Providers  Alcohol-Tobacco-Diet     Tobacco Status: never  Allergies: No Known Drug Allergies  Social History: Smoking Status:  never  Physical Exam  General:  mildly anxious, well-developed.  vital signs noted and wnl  Psych:  memory intact for recent and remote, normally interactive, not depressed appearing, not suicidal, and not homicidal.     Impression & Recommendations:  Problem # 1:  ANXIETY (ICD-300.00) Assessment Deteriorated  Patient likely experiencing increased anxiety due to confusion over medication as noted in hpi and upon discussing drugs with  patient during encounter.  Wrote clearer patient instructions for patient.  Pt will follow up with PCP.  Refilled Citalopram.  Will fax clonazepam refill to pharmacy. Her updated medication list for this problem includes:    Citalopram Hydrobromide 40 Mg Tabs (Citalopram hydrobromide) ..... One tab by mouth daily for mood swings    Clonazepam 1 Mg Tabs (Clonazepam) ..... One tab by mouth daily as needed for anxiety  Orders: [facility]- Est Level  3 ([address])  Complete Medication List: 1)  Ferrous Sulfate 325 (65 Fe) Mg Tabs (Ferrous sulfate) .... Take 1 tablet by mouth two times a day for anemia/low blood count 2)  Fluticasone Propionate 50 Mcg/act Susp (Fluticasone propionate) .... Spray 1-2 spray into both nostrils once a day for allergies 3)  Omeprazole 20 Mg Cpdr (Omeprazole) .... One tab by mouth daily for reflux/heartburn 4)  Simvastatin 80 Mg Tabs (Simvastatin) .[name].. 1 tab by mouth once daily for cholesterol 5)  Citalopram Hydrobromide 40 Mg Tabs (Citalopram hydrobromide) .... One tab by mouth daily for mood swings 6)  Hydrochlorothiazide 12.5 Mg Caps (Hydrochlorothiazide) .... One half tab by mouth daily for blood pressure 7)  Aspirin 81 Mg Tbec (Aspirin) .... One tab by mouth daily to help protect your heart 8)  Nitrostat 0.4 Mg Subl (Nitroglycerin) .... Place one tablet under your tongue if you develop chest pain. you can repeat every 5 minutes for a total of  3 tablets. 9)  Flexeril 10 Mg Tabs (Cyclobenzaprine hcl) .... One tab by mouth three times a day as needed back pain/spasm 10)  Clonazepam 1 Mg Tabs (Clonazepam) .... One tab by mouth daily as needed for anxiety  Patient Instructions: 1)  Mrs. Viall, it was good to meet you.   2)  I'm sorry that you were having some anxiety this morning.  From your story, it looks like we need to clarify what you need to take. 3)  CITALOPRAM is to be used daily for help with your mood and only to be discontinued if instructed by Dr. Lanier Prude. 4)   CLONAZEPAM is to be used when you need it for any anxiety. 5)  Please come see Dr. Lanier Prude in 2 weeks-1 month to ensure that you are doing well. 6)  Thank you and be blessed! Prescriptions: CITALOPRAM HYDROBROMIDE 40 MG TABS (CITALOPRAM HYDROBROMIDE) one tab by mouth daily for mood swings  #30 x 1   Entered and Authorized by:   Magnus Ivan MD   Signed by:   Magnus Ivan MD on 08/14/2009   Method used:   Electronically to        [facility] [address] (retail)       [address]       [place], [place]  [address]       Ph: [phone]       Fax: [phone]   RxID:   [id number]

## 2010-05-01 NOTE — Progress Notes (Signed)
Summary: triage  Phone Note Call from Patient Call back at [phone]   Summary of Call: Pt having spells. Initial call taken by: Clydell Hakim,  September 23, 2009 10:58 AM  Follow-up for Phone Call        feels heart race & gets SOB. has had 6 episodes this am.  they last a few seconds.  denies presyncope feelings.   bp was 144/95 this am. fluctuated lower & higher.  explaied fluctuations are normal depending on what you are doing..  has cardiologist & has had a stress test. states they told her she had a good heart.  due to these symptoms, I urged her to call her cardiologist now & get in to be seen. she agreed with this & will call Follow-up by: Golden Circle RN,  September 23, 2009 11:21 AM

## 2010-05-01 NOTE — Miscellaneous (Signed)
  Clinical Lists Changes  Medications: Removed medication of HYDROXYZINE HCL 50 MG TABS (HYDROXYZINE HCL) one half tab to one tab by mouth at bedtime as needed trouble sleeping

## 2010-05-01 NOTE — Progress Notes (Signed)
Summary: FYI  Phone Note Call from Patient   Caller: Patient Summary of Call: pt called to say she was going back out to [facility] to do another PPD test before xray is done Initial call taken by: De Nurse,  February 18, 2010 4:39 PM  Follow-up for Phone Call        ok thank you Follow-up by: Edd Arbour,  February 19, 2010 10:50 AM

## 2010-05-01 NOTE — Letter (Signed)
Summary: Treadmill Report  Treadmill Report   Imported By: Bradly Bienenstock 04/12/2009 15:31:43  _____________________________________________________________________  External Attachment:    Type:   Image     Comment:   External Document

## 2010-05-01 NOTE — Assessment & Plan Note (Signed)
Summary: SUMMARY   Vital Signs:  Patient profile:   60 year old female Weight:      171 pounds Temp:     98.5 degrees F oral Pulse rate:   65 / minute Pulse rhythm:   regular BP sitting:   109 / 66  (left arm) Cuff size:   regular  Vitals Entered By: [name] [name] CMA (September 09, 2009 4:18 PM)  Primary Care Provider:  Lequita Asal  MD  CC:  f/u decreased libido.  History of Present Illness: decreased libido- patient seen for same in April. was noted to be taking two SSRIs. was given instructions to stop, but has been inadvertently still taking citalopram and sertraline.   ?HTN- given dx by cards. patient stopped HCTZ which she was taking only twice a week. denies chest pain, shortness of breath, headaches, peripheral edema, blurred vision.   Habits & Providers  Alcohol-Tobacco-Diet     Tobacco Status: quit     Tobacco Counseling: to remain off tobacco products  Current Medications (verified): 1)  Ferrous Sulfate 325 (65 Fe) Mg Tabs (Ferrous Sulfate) .... Take 1 Tablet By Mouth Two Times A Day For Anemia/low Blood Count 2)  Fluticasone Propionate 50 Mcg/act Susp (Fluticasone Propionate) .... Spray 1-2 Spray Into Both Nostrils Once A Day For Allergies 3)  Omeprazole 20 Mg Cpdr (Omeprazole) .... One Tab By Mouth Daily For Reflux/heartburn 4)  Simvastatin 80 Mg Tabs (Simvastatin) .[name].. 1 Tab By Mouth Once Daily For Cholesterol 5)  Citalopram Hydrobromide 40 Mg Tabs (Citalopram Hydrobromide) .... One Tab By Mouth Daily For Mood Swings 6)  Aspirin 81 Mg Tbec (Aspirin) .... One Tab By Mouth Daily To Help Protect Your Heart 7)  Clonazepam 1 Mg Tabs (Clonazepam) .... One Tab By Mouth Daily As Needed For Anxiety  Allergies (verified): No Known Drug Allergies  Social History: Smoking Status:  quit  Physical Exam  General:  NAD. well-developed.  vital signs noted and wnl  Mouth:  MMM Neck:  No deformities, masses, or tenderness noted. Lungs:  Normal respiratory effort, chest  expands symmetrically. Lungs are clear to auscultation, no crackles or wheezes. Heart:  Normal rate and regular rhythm. S1 and S2 normal without gallop, murmur, click, rub or other extra sounds. Extremities:  No clubbing, cyanosis, edema, or deformity noted with normal full range of motion of all joints.     Impression & Recommendations:  Problem # 1:  DECREASED LIBIDO (ICD-799.81) Assessment Unchanged  possible 2/2 to too much SSRI. patient given medication list and told to discard all meds not on list. f/u in 4 weeks. if not improved off sertraline, may need further investigation.   Orders: [facility]- Est Level  3 (99213)  Problem # 2:  ELEVATED BP READING WITHOUT DX HYPERTENSION (ICD-796.2) Assessment: Improved  BP normal without HCTZ. will d/c in med list. on eval patient without sustained elevated BPs. will remove HTN from problem list.   The following medications were removed from the medication list:    Hydrochlorothiazide 12.5 Mg Caps (Hydrochlorothiazide) ..... One half tab by mouth daily for blood pressure  Orders: [facility]- Est Level  3 (99213)  BP today: 109/66 Prior BP: 118/65 (08/14/2009)  Prior 10 Yr Risk Heart Disease: 13 % (01/04/2008)  Labs Reviewed: Creat: 1.02 (07/12/2009) Chol: 263 (07/12/2009)   HDL: 57 (07/12/2009)   LDL: 181 (07/12/2009)   TG: 127 (07/12/2009)  Instructed in low sodium diet (DASH Handout) and behavior modification.    Problem # 3:  ANXIETY (ICD-300.00) Assessment:  Comment Only on clonazepam and SSRI. have discussed weaning clonazepam.   Her updated medication list for this problem includes:    Citalopram Hydrobromide 40 Mg Tabs (Citalopram hydrobromide) ..... One tab by mouth daily for mood swings    Clonazepam 1 Mg Tabs (Clonazepam) ..... One tab by mouth daily as needed for anxiety  Problem # 4:  DEPRESSIVE DISORDER (ICD-311) Assessment: Comment Only improved on citalopram.   Her updated medication list for this problem includes:     Citalopram Hydrobromide 40 Mg Tabs (Citalopram hydrobromide) ..... One tab by mouth daily for mood swings    Clonazepam 1 Mg Tabs (Clonazepam) ..... One tab by mouth daily as needed for anxiety  Problem # 5:  GERD (ICD-530.81) Assessment: Comment Only  on omeprazole.   Her updated medication list for this problem includes:    Omeprazole 20 Mg Cpdr (Omeprazole) ..... One tab by mouth daily for reflux/heartburn  Diagnostics Reviewed:  Discussed lifestyle modifications, diet, antacids/medications, and preventive measures. Handout provided.   Problem # 6:  ANEMIA, MICROCYTIC (ICD-281.9) Assessment: Comment Only takes iron intermittently. last CBC normal.  Her updated medication list for this problem includes:    Ferrous Sulfate 325 (65 Fe) Mg Tabs (Ferrous sulfate) .[name]... Take 1 tablet by mouth two times a day for anemia/low blood count  Problem # 7:  HYPERCHOLESTEROLEMIA (ICD-272.0) Assessment: Comment Only changed from pravastatin to simvastatin when last LDL was still elevated. would recheck at next visit.   Her updated medication list for this problem includes:    Simvastatin 80 Mg Tabs (Simvastatin) .[name]... 1 tab by mouth once daily for cholesterol  Labs Reviewed: SGOT: 12 (07/12/2009)   SGPT: 15 (07/12/2009)  Lipid Goals: Chol Goal: 200 (01/04/2008)   HDL Goal: 40 (01/04/2008)   LDL Goal: 160 (01/04/2008)   TG Goal: 150 (01/04/2008)  Prior 10 Yr Risk Heart Disease: 13 % (01/04/2008)   HDL:57 (07/12/2009), 53 (01/04/2008)  LDL:181 (07/12/2009), 152 (01/04/2008)  Chol:263 (07/12/2009), 224 (01/04/2008)  Trig:127 (07/12/2009), 94 (01/04/2008)  Patient Instructions: 1)  MAKE SURE TO THROW AWAY ANY MEDICINES NOT ON THIS LIST 2)  FOLLOW UP IN A MONTH to see if "issues" any better.

## 2010-05-01 NOTE — Assessment & Plan Note (Signed)
Summary: f/u,df   Vital Signs:  Patient profile:   60 year old female Weight:      173.4 pounds Temp:     98.3 degrees F oral Pulse rate:   69 / minute Pulse rhythm:   regular BP sitting:   115 / 73  (right arm) Cuff size:   large  Vitals Entered By: Loralee Pacas CMA (May 31, 2009 8:36 AM)  Primary Care Provider:  Lequita Asal  MD  CC:  f/u anxiety and depression.  History of Present Illness: 60 y/o female here for f/u stress/anxiety. on sertraline and clonazepam. multiple stressors at home with ill elderly mother and financial issues. has been requiring clonazepam pretty much daily. denies SI/HI. does report some improvement using sertraline 100 mg. patient endorses 2-3 episodes this week which sound like panic attacks (sweats, palpitations).   Habits & Providers  Alcohol-Tobacco-Diet     Tobacco Status: quit > 6 months     Tobacco Counseling: to remain off tobacco products  Current Medications (verified): 1)  Ferrous Sulfate 325 (65 Fe) Mg Tabs (Ferrous Sulfate) .... Take 1 Tablet By Mouth Bid 2)  Fluticasone Propionate 50 Mcg/act Susp (Fluticasone Propionate) .... Spray 1-2 Spray Into Both Nostrils Once A Day 3)  Omeprazole 20 Mg Cpdr (Omeprazole) .... One Tab By Mouth Daily 4)  Ultram 50 Mg  Tabs (Tramadol Hcl) .... One Tab By Mouth Q4-Q6 As Needed Pain 5)  Simvastatin 80 Mg Tabs (Simvastatin) .[name].. 1 Tab By Mouth Once Daily 6)  Citalopram Hydrobromide 40 Mg Tabs (Citalopram Hydrobromide) .... One Tab By Mouth Daily 7)  Hydrochlorothiazide 12.5 Mg Caps (Hydrochlorothiazide) .... One Half Tab By Mouth Three Times A Week. 8)  Aspirin 81 Mg Tbec (Aspirin) .... One Tab By Mouth Daily 9)  Nitrostat 0.4 Mg Subl (Nitroglycerin) .... Place One Tablet Under Your Tongue If You Develop Chest Pain. You Can Repeat Every 5 Minutes For A Total of 3 Tablets. 10)  Flexeril 10 Mg Tabs (Cyclobenzaprine Hcl) .... One Tab By Mouth Three Times A Day As Needed Back Pain/spasm 11)   Clonazepam 1 Mg Tabs (Clonazepam) .... One Tab By Mouth Daily As Needed For Anxiety  Allergies (verified): No Known Drug Allergies  Social History: Smoking Status:  quit > 6 months  Physical Exam  General:  alert, well-developed, NAD, vitals reviewed.  Psych:  Oriented X3, memory intact for recent and remote, normally interactive, not anxious appearing, and not depressed appearing.     Impression & Recommendations:  Problem # 1:  DEPRESSIVE DISORDER (ICD-311) Assessment Unchanged  will change to citalopram to see if patient can get some additional benefit.   Her updated medication list for this problem includes:    Citalopram Hydrobromide 40 Mg Tabs (Citalopram hydrobromide) ..... One tab by mouth daily    Clonazepam 1 Mg Tabs (Clonazepam) ..... One tab by mouth daily as needed for anxiety  Orders: [facility]- Est Level  3 (78295)  Problem # 2:  ANXIETY (ICD-300.00) Assessment: New  will refill clonazepam.   Her updated medication list for this problem includes:    Citalopram Hydrobromide 40 Mg Tabs (Citalopram hydrobromide) ..... One tab by mouth daily    Clonazepam 1 Mg Tabs (Clonazepam) ..... One tab by mouth daily as needed for anxiety  Orders: [facility]- Est Level  3 (62130)  Complete Medication List: 1)  Ferrous Sulfate 325 (65 Fe) Mg Tabs (Ferrous sulfate) .... Take 1 tablet by mouth bid 2)  Fluticasone Propionate 50 Mcg/act Susp (Fluticasone  propionate) .... Spray 1-2 spray into both nostrils once a day 3)  Omeprazole 20 Mg Cpdr (Omeprazole) .... One tab by mouth daily 4)  Ultram 50 Mg Tabs (Tramadol hcl) .... One tab by mouth q4-q6 as needed pain 5)  Simvastatin 80 Mg Tabs (Simvastatin) .[name].. 1 tab by mouth once daily 6)  Citalopram Hydrobromide 40 Mg Tabs (Citalopram hydrobromide) .... One tab by mouth daily 7)  Hydrochlorothiazide 12.5 Mg Caps (Hydrochlorothiazide) .... One half tab by mouth three times a week. 8)  Aspirin 81 Mg Tbec (Aspirin) .... One tab by mouth  daily 9)  Nitrostat 0.4 Mg Subl (Nitroglycerin) .... Place one tablet under your tongue if you develop chest pain. you can repeat every 5 minutes for a total of 3 tablets. 10)  Flexeril 10 Mg Tabs (Cyclobenzaprine hcl) .... One tab by mouth three times a day as needed back pain/spasm 11)  Clonazepam 1 Mg Tabs (Clonazepam) .... One tab by mouth daily as needed for anxiety  Patient Instructions: 1)  Follow up in 4 months with Dr. Lanier Prude  Prescriptions: CITALOPRAM HYDROBROMIDE 40 MG TABS (CITALOPRAM HYDROBROMIDE) one tab by mouth daily  #90 x 1   Entered and Authorized by:   Lequita Asal  MD   Signed by:   Lequita Asal  MD on 05/31/2009   Method used:   Electronically to        [facility] (retail)       [address]       [place], [place]  [address]       Ph: [phone]       Fax: [phone]   RxID:   [id number]

## 2010-05-01 NOTE — Assessment & Plan Note (Signed)
Summary: 6wk f/u sl   Primary Provider:  Lequita Asal  MD  CC:  follow up echo pt also complains of sob.  History of Present Illness: Pleasant female I saw recently for evaluation of abnormal stress test. The patient states that she has had a history of mitral valve prolapse since the 80s. She apparently has had occasional chest pain for many years.  She had a treadmill recently for the above symptoms and apparently had ST depression laterally. We scheduled an echocardiogram which was performed in February of 2011. This showed normal LV function and no mitral valve prolapse. A stress echocardiogram showed electrocardiographic changes but there were no wall motion abnormalities. There was reduced sensitivity as the stress images were obtained at lower heart rates.  Note a previous MRA in February of 2010 was normal. Since I saw her previously she has dyspnea with more extreme activities but not with routine activities. It is relieved with rest. There is no associated chest pain. Note she has not had exertional chest pain. There is no orthopnea, PND or pedal edema. She's had 2 episodes of feeling a smothering sensation for approximately 1 minute. There is no associated chest pain, palpitations or syncope. It resolved spontaneously.  Current Medications (verified): 1)  Ferrous Sulfate 325 (65 Fe) Mg Tabs (Ferrous Sulfate) .... Take 1 Tablet By Mouth Bid 2)  Fluticasone Propionate 50 Mcg/act Susp (Fluticasone Propionate) .... Spray 1-2 Spray Into Both Nostrils Once A Day 3)  Omeprazole 20 Mg Cpdr (Omeprazole) .... One Tab By Mouth Daily 4)  Ultram 50 Mg  Tabs (Tramadol Hcl) .... One Tab By Mouth Q4-Q6 As Needed Pain 5)  Simvastatin 80 Mg Tabs (Simvastatin) .[name].. 1 Tab By Mouth Once Daily 6)  Citalopram Hydrobromide 40 Mg Tabs (Citalopram Hydrobromide) .... One Tab By Mouth Daily 7)  Hydrochlorothiazide 12.5 Mg Caps (Hydrochlorothiazide) .... One Half Tab By Mouth Three Times A Week. 8)  Aspirin 81  Mg Tbec (Aspirin) .... One Tab By Mouth Daily 9)  Nitrostat 0.4 Mg Subl (Nitroglycerin) .... Place One Tablet Under Your Tongue If You Develop Chest Pain. You Can Repeat Every 5 Minutes For A Total of 3 Tablets. 10)  Flexeril 10 Mg Tabs (Cyclobenzaprine Hcl) .... One Tab By Mouth Three Times A Day As Needed Back Pain/spasm 11)  Clonazepam 1 Mg Tabs (Clonazepam) .... One Tab By Mouth Daily As Needed For Anxiety  Allergies: No Known Drug Allergies  Past History:  Past Medical History: Reviewed history from 04/15/2009 and no changes required. Hypertension H/O Mitral valve prolapse gout POSTMENOPAUSAL STATUS (ICD-V49.81) CARPAL TUNNEL SYNDROME (ICD-354.0) DEPRESSIVE DISORDER (ICD-311) OBESITY (ICD-278.00) BACK PAIN, CHRONIC (ICD-724.5) GERD (ICD-530.81) ANEMIA, MICROCYTIC (ICD-281.9) HYPERCHOLESTEROLEMIA (ICD-27  Past Surgical History: Reviewed history from 04/15/2009 and no changes required. hysterectomy - 11/13/1999  Tubal ligation - 11/13/1999  Social History: Reviewed history from 09/19/2008 and no changes required. Married. Lives with husband, daughter, niece in [place]. Does in home health care. Smoking > 20 pack years. Quit 2005.  2-3beers/wk. No drugs.  Another daughter lives out of home.  Niece has fetal alcohol syndrome. 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 Remaining systems are negative.   Vital Signs:  Patient profile:   60 year old female Height:      62 inches Weight:      173 pounds BMI:  31.76 Pulse rate:   67 / minute Resp:     14 per minute BP sitting:   117 / 73  (left arm)  Vitals Entered By: Kem Parkinson (May 31, 2009 9:10 AM)  Physical Exam  General:  Well-developed well-nourished in no acute distress.    Skin is warm and dry.  HEENT is normal.  Neck is supple. No thyromegaly.  Chest is clear to auscultation with normal expansion.  Cardiovascular exam is regular rate and rhythm.  Abdominal exam nontender or distended. No masses palpated. Extremities show no edema. neuro grossly intact    Impression & Recommendations:  Problem # 1:  CHEST DISCOMFORT (ICD-786.59) No further symptoms. Stress echocardiogram unremarkable. No further workup at this time. Her updated medication list for this problem includes:    Aspirin 81 Mg Tbec (Aspirin) ..... One tab by mouth daily    Nitrostat 0.4 Mg Subl (Nitroglycerin) .[name]... Place one tablet under your tongue if you develop chest pain. you can repeat every 5 minutes for a total of 3 tablets.  Problem # 2:  ESSENTIAL HYPERTENSION, BENIGN (ICD-401.1) Blood pressure controlled on present medications. Will continue. Her updated medication list for this problem includes:    Hydrochlorothiazide 12.5 Mg Caps (Hydrochlorothiazide) ..... One half tab by mouth three times a week.    Aspirin 81 Mg Tbec (Aspirin) ..... One tab by mouth daily  Problem # 3:  MITRAL VALVE PROLAPSE (ICD-424.0) Recent echocardiogram showed no evidence of this. Her updated medication list for this problem includes:    Hydrochlorothiazide 12.5 Mg Caps (Hydrochlorothiazide) ..... One half tab by mouth three times a week.    Nitrostat 0.4 Mg Subl (Nitroglycerin) .[name]... Place one tablet under your tongue if you develop chest pain. you can repeat every 5 minutes for a total of 3 tablets.  Problem # 4:  GERD (ICD-530.81)  Her updated medication list for this problem includes:    Omeprazole 20 Mg Cpdr (Omeprazole) ..... One tab by mouth daily  Problem # 5:  HYPERCHOLESTEROLEMIA (ICD-272.0) Continue statin. Lipids and liver followed by primary care. Her updated medication list for this problem includes:    Simvastatin 80 Mg Tabs (Simvastatin) .[name]... 1 tab by mouth once daily  Patient  Instructions: 1)  Your physician recommends that you schedule a follow-up appointment in: as needed

## 2010-05-01 NOTE — Miscellaneous (Signed)
Summary: [facility] Cont[facility]stance Contract  [facility] Cont[facility]stance [facility]   Imported By: Clydell Hakim 05/31/2009 16:06:33  _____________________________________________________________________  External Attachment:    Type:   Image     Comment:   External Document

## 2010-05-01 NOTE — Assessment & Plan Note (Signed)
Summary: pressure in back,df   Vital Signs:  Patient profile:   60 year old female Weight:      168 pounds Pulse rate:   70 / minute BP sitting:   140 / 75  (left arm) Cuff size:   regular  Vitals Entered By: Renato Battles slade,cma CC: left shoulder pain x 2 weeks. burning sensation radiates into left arm x 1 week Is Patient Diabetic? No Pain Assessment Patient in pain? no        Primary Care Provider:  Lequita Asal  MD  CC:  left shoulder pain x 2 weeks. burning sensation radiates into left arm x 1 week.  History of Present Illness: Pain under left scapula, for several days.  Has been exercising 3 times per week and does upper body as well.  Worries about every little ache and pain and that it may be a sign of something serious with her health.  She recently saw cardiology for some atypical chest pain.  She is concerned that she is on sinvastatin 80 mg after receiving the 'alert' from her pharmacy.  She had been changed from pravochol 20 to simvastatin 80 this past April.   Habits & Providers  Alcohol-Tobacco-Diet     Tobacco Status: quit > 6 months  Current Medications (verified): 1)  Ferrous Sulfate 325 (65 Fe) Mg Tabs (Ferrous Sulfate) .... Take 1 Tablet By Mouth Two Times A Day For Anemia/low Blood Count 2)  Fluticasone Propionate 50 Mcg/act Susp (Fluticasone Propionate) .... Spray 1-2 Spray Into Both Nostrils Once A Day For Allergies 3)  Omeprazole 20 Mg Cpdr (Omeprazole) .... One Tab By Mouth Daily For Reflux/heartburn 4)  Simvastatin 40 Mg Tabs (Simvastatin) .... One Q Hs 5)  Citalopram Hydrobromide 40 Mg Tabs (Citalopram Hydrobromide) .... One Tab By Mouth Daily For Mood Swings 6)  Aspirin 81 Mg Tbec (Aspirin) .... One Tab By Mouth Daily To Help Protect Your Heart 7)  Clonazepam 1 Mg Tabs (Clonazepam) .... One Tab By Mouth Daily As Needed For Anxiety 8)  Hydrochlorothiazide 12.5 Mg  Tabs (Hydrochlorothiazide) .... Take 1 Tab  By Mouth Every Morning  Allergies  (verified): No Known Drug Allergies  Social History: Smoking Status:  quit > 6 months  Review of Systems General:  Denies fatigue and malaise. CV:  Denies chest pain or discomfort. MS:  Complains of mid back pain.  Physical Exam  General:  alert and well-developed.   Msk:  Tenderness over left Rhomboid muscle group and under left scapula.  Excellent ROM and muscle strenth of UE   Impression & Recommendations:  Problem # 1:  MUSCLE STRAIN (ICD-848.9)  involving rhomboid muscle group; taught and demonstrated exercises, ICE, massage, reasurrance  Orders: [facility]- Est Level  3 (40981)  Problem # 2:  HYPERCHOLESTEROLEMIA (ICD-272.0)  reduced statin to 40 mg, check lipids today and hepatic.  Patient very concerned about the warning around simvastatin 80 mg and since she was on a weaker statin at low dose prior to this change went ahead and reduced her to 40 mg daily. Her updated medication list for this problem includes:    Simvastatin 40 Mg Tabs (Simvastatin) ..... One q hs  Orders: Direct LDL-[facility] ([address]-82956) Hepatic-[facility] ([address]08-65784) [facility]- Est Level  3 (69629)  Problem # 3:  ANXIETY (ICD-300.00)  Worries about bad health coming about Her updated medication list for this problem includes:    Citalopram Hydrobromide 40 Mg Tabs (Citalopram hydrobromide) ..... One tab by mouth daily for mood swings  Clonazepam 1 Mg Tabs (Clonazepam) ..... One tab by mouth daily as needed for anxiety  Orders: [facility]- Est Level  3 ([address])  Complete Medication List: 1)  Ferrous Sulfate 325 (65 Fe) Mg Tabs (Ferrous sulfate) .... Take 1 tablet by mouth two times a day for anemia/low blood count 2)  Fluticasone Propionate 50 Mcg/act Susp (Fluticasone propionate) .... Spray 1-2 spray into both nostrils once a day for allergies 3)  Omeprazole 20 Mg Cpdr (Omeprazole) .... One tab by mouth daily for reflux/heartburn 4)  Simvastatin 40 Mg Tabs (Simvastatin) .... One q hs 5)  Citalopram Hydrobromide 40 Mg  Tabs (Citalopram hydrobromide) .... One tab by mouth daily for mood swings 6)  Aspirin 81 Mg Tbec (Aspirin) .... One tab by mouth daily to help protect your heart 7)  Clonazepam 1 Mg Tabs (Clonazepam) .... One tab by mouth daily as needed for anxiety 8)  Hydrochlorothiazide 12.5 Mg Tabs (Hydrochlorothiazide) .... Take 1 tab  by mouth every morning  Patient Instructions: 1)  You have Rhomboid muscle strain-please do exercises 2)  Cut Simvastain 80 mg in 1/2 and then new script will be for 40 mg 3)  Apt to see primary MD in 2 months. Prescriptions: HYDROCHLOROTHIAZIDE 12.5 MG  TABS (HYDROCHLOROTHIAZIDE) Take 1 tab  by mouth every morning  #90 x 3   Entered and Authorized by:   Luretha Murphy NP   Signed by:   Luretha Murphy NP on 10/07/2009   Method used:   Historical   RxID:   [id number] SIMVASTATIN 40 MG TABS (SIMVASTATIN) one q hs  #30 x 6   Entered and Authorized by:   Luretha Murphy NP   Signed by:   Luretha Murphy NP on 10/07/2009   Method used:   Electronically to        [facility] (retail)       [address]       [place], [place]  [address]       Ph: [phone]       Fax: [phone]   RxID:   [id number]

## 2010-05-01 NOTE — Miscellaneous (Signed)
  Clinical Lists Changes  Medications: Removed medication of ULTRAM 50 MG  TABS (TRAMADOL HCL) one tab by mouth Q4-Q6 as needed pain

## 2010-05-01 NOTE — Assessment & Plan Note (Signed)
Summary: THUMB BONE SPUR/MJD   Vital Signs:  Patient profile:   60 year old female Height:      62 inches Weight:      176 pounds Pulse rate:   59 / minute BP sitting:   121 / 75  (right arm)  Vitals Entered By: Rochele Pages RN (February 10, 2010 8:36 AM) CC: rt thumb pain    Primary Provider:  Edd Arbour  CC:  rt thumb pain .  History of Present Illness: Pt returns to clinic for f/u of rt thumb thumb pain that continues intermittently.  Injected thumb June 2010. U/s found calcific change and nodule. Denies locking of thumb, swelling of hand or wrist.  since injection has not triggered again no longer has any carpal tunnel sxs wore wrist brace several years back  comes for advice  Preventive Screening-Counseling & Management  Alcohol-Tobacco     Smoking Status: quit > 6 months     Year Quit: 2006  Allergies: No Known Drug Allergies  Physical Exam  General:  Well-developed,well-nourished,in no acute distress; alert,appropriate and cooperative throughout examination Msk:  Negative tenel's and phalen's Nodule at base of rt thumb at MCP. Has full ROM of rt wrist No DJD at MCPs or CMC joint, no swelling noted.    Impression & Recommendations:  Problem # 1:  THUMB PAIN, RIGHT (ICD-729.5) This is definitely better than last year since injection no longer has trigger thumb  will try on topical ketoprofen gel 20% three times a day to qid when pain is less cut down frequency  would save reinjection until/ unless locking or more severe pain  Complete Medication List: 1)  Ferrous Sulfate 325 (65 Fe) Mg Tabs (Ferrous sulfate) .... Take 1 tablet by mouth two times a day for anemia/low blood count 2)  Fluticasone Propionate 50 Mcg/act Susp (Fluticasone propionate) .... Spray 1-2 spray into both nostrils once a day for allergies 3)  Omeprazole 20 Mg Cpdr (Omeprazole) .... One tab by mouth daily for reflux/heartburn 4)  Simvastatin 40 Mg Tabs (Simvastatin) .... One q  hs 5)  Citalopram Hydrobromide 40 Mg Tabs (Citalopram hydrobromide) .... Take two tablets daily 6)  Aspirin 81 Mg Tbec (Aspirin) .... One tab by mouth daily to help protect your heart 7)  Hydrochlorothiazide 12.5 Mg Tabs (Hydrochlorothiazide) .... Take 1 tab  by mouth every morning 8)  Flexeril 10 Mg Tabs (Cyclobenzaprine hcl) .... Take one tablet at night for back pain and as a sleep aid 9)  Ketoprofen Gel 20%  .... Apply to affected area four times per day Prescriptions: KETOPROFEN GEL 20% Apply to affected area four times per day  #60 grams x 2   Entered by:   Rochele Pages RN   Authorized by:   Enid Baas MD   Signed by:   Rochele Pages RN on 02/10/2010   Method used:   Print then Give to Patient   RxID:   [id number]    Orders Added: 1)  Est. Patient Level III [56213]

## 2010-05-01 NOTE — Progress Notes (Signed)
Summary: Rx Req  Phone Note Call from Patient Call back at [phone]   Caller: Patient Summary of Call: Pt asking for her anxiety medication to be filled due to having so much stress in her life.  Initial call taken by: Clydell Hakim,  May 24, 2009 11:34 AM  Follow-up for Phone Call        the clonazepam was for acute episode. if she is having chronic problem, alternative medication would be better. needs appt to discuss.  Follow-up by: Lequita Asal  MD,  May 24, 2009 11:40 AM  Additional Follow-up for Phone Call Additional follow up Details #1::        Pt notified, will call back to set up apt. Additional Follow-up by: Gladstone Pih,  May 24, 2009 12:00 PM

## 2010-05-01 NOTE — Letter (Signed)
Summary: [facility]: TB results  [facility]: TB results   Imported By: Knox Royalty 02/27/2010 09:41:41  _____________________________________________________________________  External Attachment:    Type:   Image     Comment:   External Document

## 2010-05-01 NOTE — Progress Notes (Signed)
Summary: Rx Req  Phone Note Refill Request Call back at [phone] Message from:  Patient  Refills Requested: Medication #1:  HYDROCHLOROTHIAZIDE 12.5 MG  TABS Take 1 tab  by mouth every morning [facility] [address]  Initial call taken by: Clydell Hakim,  December 03, 2009 2:04 PM  Follow-up for Phone Call        patient notified that Rx had been sent in 10/07/2009 by Luretha Murphy to [facility] [address]. she will call and have [facility]. Follow-up by: Theresia Lo RN,  December 03, 2009 2:35 PM

## 2010-05-01 NOTE — Assessment & Plan Note (Signed)
Summary: np3/abn stress echo   Primary Provider:  Lequita Asal  MD  CC:  chest pains under breast.  History of Present Illness: 60 yo female for evaluation of abnormal stress test. The patient states that she has had a history of mitral valve prolapse since the 80s. She apparently has had occasional chest pain for many years. This can occur with exertion but also occurs at rest. It can be a tightness or a sharp pain under the left breast. There is no associated symptoms. She had a treadmill recently for the above symptoms and apparently had ST depression laterally. I do not have the tracings available. She did not have chest pain during the study. Because of  the above we were asked to further evaluate. Note she does have some dyspnea on exertion as well but there is no orthopnea or PND. She's also had some recent dizzy spells that are unrelated to her chest pain. There's been no syncope. Note a previous MRA in February of 2010 was normal.  Current Medications (verified): 1)  Ferrous Sulfate 325 (65 Fe) Mg Tabs (Ferrous Sulfate) .... Take 1 Tablet By Mouth Bid 2)  Fluticasone Propionate 50 Mcg/act Susp (Fluticasone Propionate) .... Spray 1-2 Spray Into Both Nostrils Once A Day 3)  Omeprazole 20 Mg Cpdr (Omeprazole) .... One Tab By Mouth Daily 4)  Ultram 50 Mg  Tabs (Tramadol Hcl) .... One Tab By Mouth Q4-Q6 As Needed Pain 5)  Simvastatin 80 Mg Tabs (Simvastatin) .[name].. 1 Tab By Mouth Once Daily 6)  Sertraline Hcl 100 Mg Tabs (Sertraline Hcl) .... One Tab By Mouth Daily 7)  Hydrochlorothiazide 12.5 Mg Caps (Hydrochlorothiazide) .... One Half Tab By Mouth Three Times A Week. 8)  Hydroxyzine Hcl 50 Mg Tabs (Hydroxyzine Hcl) .... One Half Tab To One Tab By Mouth At Bedtime As Needed Trouble Sleeping 9)  Aspirin 81 Mg Tbec (Aspirin) .... One Tab By Mouth Daily 10)  Nitrostat 0.4 Mg Subl (Nitroglycerin) .... Place One Tablet Under Your Tongue If You Develop Chest Pain. You Can Repeat Every 5 Minutes  For A Total of 3 Tablets.  Allergies: No Known Drug Allergies  Past History:  Past Medical History: Hypertension H/O Mitral valve prolapse gout POSTMENOPAUSAL STATUS (ICD-V49.81) CARPAL TUNNEL SYNDROME (ICD-354.0) DEPRESSIVE DISORDER (ICD-311) OBESITY (ICD-278.00) BACK PAIN, CHRONIC (ICD-724.5) GERD (ICD-530.81) ANEMIA, MICROCYTIC (ICD-281.9) HYPERCHOLESTEROLEMIA (ICD-27  Past Surgical History: hysterectomy - 11/13/1999  Tubal ligation - 11/13/1999  Family History: Reviewed history from 05/27/2006 and no changes required. Brother--sz d/o, Father--lung CA, HTN, Maternal aunt--breast CA, DM, Mother--DM, CAD, MI, HTN  Social History: Reviewed history from 09/19/2008 and no changes required. Married. Lives with husband, daughter, niece in [place]. Does in home health care. Smoking > 20 pack years. Quit 2005.  2-3beers/wk. No drugs.  Another daughter lives out of home.  Niece has fetal alcohol syndrome. 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 Remaining systems are negative.   Vital Signs:  Patient profile:   60 year old female Height:      62 inches Weight:      173 pounds BMI:     31.76 Pulse rate:   65 / minute Resp:     14 per minute BP sitting:   136 / 85  (left arm)  Vitals Entered By: Kem Parkinson (  April 15, 2009 10:40 AM)  Physical Exam  General:  Well developed/well nourished in NAD Skin warm/dry Patient not depressed No peripheral clubbing Back-normal HEENT-normal/normal eyelids Neck supple/normal carotid upstroke bilaterally; left carotid bruit; no JVD; no thyromegaly chest - CTA/ normal expansion CV - RRR/normal S1 and S2; 1/6 systolic murmur at the apex; PMI nondisplaced Abdomen -NT/ND, no  HSM, no mass, + bowel sounds, no bruit 2+ femoral pulses, no bruits Ext-no edema, chords, 2+ DP Neuro-grossly nonfocal     EKG  Procedure date:  04/15/2009  Findings:      Sinus rhythm at a rate of 66. Nonspecific ST changes.  Impression & Recommendations:  Problem # 1:  ABNORMAL STRESS ELECTROCARDIOGRAM (ICD-794.31) Patient's symptoms are somewhat atypical and apparently chronic based on her description. I will schedule a stress echocardiogram to more fully assess. Proceed with cardiac catheterization if abnormal. Her updated medication list for this problem includes:    Aspirin 81 Mg Tbec (Aspirin) ..... One tab by mouth daily    Nitrostat 0.4 Mg Subl (Nitroglycerin) .[name]... Place one tablet under your tongue if you develop chest pain. you can repeat every 5 minutes for a total of 3 tablets.  Orders: Echocardiogram (Echo)  Problem # 2:  CAROTID BRUIT, LEFT (ICD-785.9) Previous MRA normal. No further evaluation.  Problem # 3:  CHEST DISCOMFORT (ICD-786.59) Schedule stress echocardiogram as described above. Her updated medication list for this problem includes:    Aspirin 81 Mg Tbec (Aspirin) ..... One tab by mouth daily    Nitrostat 0.4 Mg Subl (Nitroglycerin) .[name]... Place one tablet under your tongue if you develop chest pain. you can repeat every 5 minutes for a total of 3 tablets.  Orders: Stress Echo (Stress Echo)  Problem # 4:  HYPERCHOLESTEROLEMIA (ICD-272.0) Continue statin. Lipids and liver monitored by primary care. Her updated medication list for this problem includes:    Simvastatin 80 Mg Tabs (Simvastatin) .[name]... 1 tab by mouth once daily  Problem # 5:  ESSENTIAL HYPERTENSION, BENIGN (ICD-401.1) Blood pressure controlled. Continue present medications. Her updated medication list for this problem includes:    Hydrochlorothiazide 12.5 Mg Caps (Hydrochlorothiazide) ..... One half tab by mouth three times a week.    Aspirin 81 Mg Tbec (Aspirin) ..... One tab by  mouth daily  Problem # 6:  MITRAL VALVE PROLAPSE (ICD-424.0) Schedule echocardiogram. Her updated medication list for this problem includes:    Hydrochlorothiazide 12.5 Mg Caps (Hydrochlorothiazide) ..... One half tab by mouth three times a week.    Nitrostat 0.4 Mg Subl (Nitroglycerin) .[name]... Place one tablet under your tongue if you develop chest pain. you can repeat every 5 minutes for a total of 3 tablets.  Patient Instructions: 1)  Your physician recommends that you schedule a follow-up appointment in: 6 WEEKS 2)  Your physician has requested that you have a carotid duplex. This test is an ultrasound of the carotid arteries in your neck. It looks at blood flow through these arteries that supply the brain with blood. Allow one hour for this exam. There are no restrictions or special instructions. 3)  Your physician has requested that you have a stress echocardiogram. For further information please visit [email or web address]  Please follow instruction sheet as given. 4)  Your physician has requested that you have an echocardiogram.  Echocardiography is a painless test that uses sound waves to create images of your heart. It provides your doctor with information about the size and shape of your heart and how well your heart's  chambers and valves are working.  This procedure takes approximately one hour. There are no restrictions for this procedure.

## 2010-05-01 NOTE — Miscellaneous (Signed)
Summary: clonazepam refill requested  Clinical Lists Changes refill request rec'd this am for her clonazepam. to pcp.Golden Circle RN  January 02, 2010 9:21 AM

## 2010-05-01 NOTE — Progress Notes (Signed)
Summary: phn msg  Phone Note From Other Clinic Call back at [phone]   Caller: [facility] Summary of Call: needs to speak with someone about drug interaction with Cytalapram and Omeprosole Ref# [id number] Initial call taken by: De Nurse,  March 18, 2010 9:35 AM  Follow-up for Phone Call        Pt has been on these meds since March/April.  Will not change anything now.  Dr. Rivka Safer will be back from vacation next Tuesday. Follow-up by: Sarah Swaziland MD,  March 19, 2010 11:01 AM

## 2010-05-01 NOTE — Assessment & Plan Note (Signed)
Summary: pos PPD,df   Vital Signs:  Patient profile:   60 year old female Weight:      174.8 pounds Temp:     99 degrees F oral Pulse rate:   70 / minute Pulse rhythm:   regular BP sitting:   117 / 72  (left arm) Cuff size:   regular  Vitals Entered By: Loralee Pacas CMA (February 17, 2010 2:16 PM) CC: positive ppd Is Patient Diabetic? No Comments pt stated that she tested positive from a TB test that was done at her job(pt brought paperwork). nurse wanted to know if a CXR can be ordered.  pt has hx of brother testing positive appx 12 years ago with TB, tx and precautions given to family.   Primary Provider:  Edd Arbour  CC:  positive ppd.  History of Present Illness: 1. 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 she has no history of contact with people from other countries. She has no fevers, chills, night sweats, no cough, no hemoptysis, no weight loss, no lymphadenopathy, no viral syndrome, no rashes, no diarrhea, no SOB.   Habits & Providers  Alcohol-Tobacco-Diet     Alcohol drinks/day: <1     Alcohol type: beer     Tobacco Status: quit > 6 months     Tobacco Counseling: to remain off tobacco products     Year Quit: 2006  Exercise-Depression-Behavior     Have you felt down or hopeless? no     Have you felt little pleasure in things? no     Depression Counseling: not indicated; screening negative for depression  Allergies: No Known Drug Allergies  Review of Systems       reviewed, negative or as above.  Physical Exam  General:  Well-developed,well-nourished,in no acute distress; alert,appropriate and cooperative throughout examination Head:  Normocephalic and atraumatic without obvious abnormalities. No apparent alopecia or balding. Lungs:  Normal respiratory effort, chest expands symmetrically. Lungs are clear to auscultation, no crackles or wheezes. Heart:  Normal rate and regular rhythm. S1 and S2 normal without gallop, murmur,  click, rub or other extra sounds. Skin:  Intact without suspicious lesions or rashes Cervical Nodes:  No lymphadenopathy noted Axillary Nodes:  No palpable lymphadenopathy Inguinal Nodes:  No significant adenopathy   Impression & Recommendations:  Problem # 1:  CONTACT WITH OR EXPOSURE TO TUBERCULOSIS (ICD-V01.1) Referred patient to the Health Department active disease unlikely. Most likely false positive result.  Orders: [facility]- Est Level  3 (28413)  Complete Medication List: 1)  Ferrous Sulfate 325 (65 Fe) Mg Tabs (Ferrous sulfate) .... Take 1 tablet by mouth two times a day for anemia/low blood count 2)  Fluticasone Propionate 50 Mcg/act Susp (Fluticasone propionate) .... Spray 1-2 spray into both nostrils once a day for allergies 3)  Omeprazole 20 Mg Cpdr (Omeprazole) .... One tab by mouth daily for reflux/heartburn 4)  Simvastatin 40 Mg Tabs (Simvastatin) .... One q hs 5)  Citalopram Hydrobromide 40 Mg Tabs (Citalopram hydrobromide) .... Take two tablets daily 6)  Aspirin 81 Mg Tbec (Aspirin) .... One tab by mouth daily to help protect your heart 7)  Hydrochlorothiazide 12.5 Mg Tabs (Hydrochlorothiazide) .... Take 1 tab  by mouth every morning 8)  Flexeril 10 Mg Tabs (Cyclobenzaprine hcl) .... Take one tablet at night for back pain and as a sleep aid 9)  Ketoprofen Gel 20%  .... Apply to affected area four times per day  Patient Instructions: 1)  please go  to the health department immediately. 2)  Please schedule a follow-up appointment as needed.   Orders Added: 1)  [facility]- Est Level  3 [16109]

## 2010-05-01 NOTE — Miscellaneous (Signed)
Summary: cards referral  Clinical Lists Changes  Problems: Added new problem of ABNORMAL STRESS ELECTROCARDIOGRAM (ICD-794.31) Added new problem of TOBACCO USE, QUIT (ICD-V15.82) Orders: Added new Referral order of Cardiology Referral (Cardiology) - Signed

## 2010-05-01 NOTE — Progress Notes (Signed)
Summary: meds prob  Phone Note Call from Patient Call back at [phone]   Caller: Patient Summary of Call: states that pharmacy is telling her that she should not be taking Citolapram & [facility]epraz[facility] the [facility] Initial call taken by: De Nurse,  January 22, 2010 1:43 PM  Follow-up for Phone Call        This doseage is fine.  You can tell her I know she is taking both medications and i feel this is safe for her.  Please have her reschedule her cancelled appt to meet her PCP and she can disucss treatment further with him. Follow-up by: Delbert Harness MD,  January 22, 2010 2:24 PM  Additional Follow-up for Phone Call Additional follow up Details #1::        patient notified and pharmacy notified. Additional Follow-up by: Theresia Lo RN,  January 24, 2010 10:20 AM

## 2010-05-01 NOTE — Assessment & Plan Note (Signed)
Summary: f/up,tcb   Vital Signs:  Patient profile:   60 year old female Weight:      172.5 pounds Temp:     98.7 degrees F oral Pulse rate:   73 / minute BP sitting:   126 / 76  (left arm) Cuff size:   large  Vitals Entered By: Loralee Pacas CMA (July 12, 2009 8:42 AM) CC: f/u HTN, HLD, decreased libido Comments bloodwork, sex drive is low   Primary Care Provider:  Lequita Asal  MD  CC:  f/u HTN, HLD, and decreased libido.  History of Present Illness: 60 y/o female here for f/u  HTN- on 6.25 of HCTZ daily. no issues. no shortness of breath, peripheral edema, headaches, blurred vision.   Family h/o DM- mother with DM. patient concerned. denies polyuria, weight loss. some increased thirst. dryness of skin around mouth.   HLD- due for lipid panel. no simvastatin. no issues  decreased libido- problem over last several weeks. no interest in sex and during intercourse unable to climax. has been taking citalopram but never stopped sertraline as previously directed.   Habits & Providers  Alcohol-Tobacco-Diet     Tobacco Status: quit     Tobacco Counseling: to remain off tobacco products  Current Medications (verified): 1)  Ferrous Sulfate 325 (65 Fe) Mg Tabs (Ferrous Sulfate) .... Take 1 Tablet By Mouth Two Times A Day For Anemia/low Blood Count 2)  Fluticasone Propionate 50 Mcg/act Susp (Fluticasone Propionate) .... Spray 1-2 Spray Into Both Nostrils Once A Day For Allergies 3)  Omeprazole 20 Mg Cpdr (Omeprazole) .... One Tab By Mouth Daily For Reflux/heartburn 4)  Simvastatin 80 Mg Tabs (Simvastatin) .[name].. 1 Tab By Mouth Once Daily For Cholesterol 5)  Citalopram Hydrobromide 40 Mg Tabs (Citalopram Hydrobromide) .... One Tab By Mouth Daily For Mood Swings 6)  Hydrochlorothiazide 12.5 Mg Caps (Hydrochlorothiazide) .... One Half Tab By Mouth Daily For Blood Pressure 7)  Aspirin 81 Mg Tbec (Aspirin) .... One Tab By Mouth Daily To Help Protect Your Heart 8)  Nitrostat 0.4 Mg  Subl (Nitroglycerin) .... Place One Tablet Under Your Tongue If You Develop Chest Pain. You Can Repeat Every 5 Minutes For A Total of 3 Tablets. 9)  Flexeril 10 Mg Tabs (Cyclobenzaprine Hcl) .... One Tab By Mouth Three Times A Day As Needed Back Pain/spasm 10)  Clonazepam 1 Mg Tabs (Clonazepam) .... One Tab By Mouth Daily As Needed For Anxiety  Allergies (verified): No Known Drug Allergies  Social History: Smoking Status:  quit  Physical Exam  General:  alert, well-developed, NAD, vitals reviewed.  Mouth:  Oral mucosa and oropharynx without lesions or exudates.  Teeth in good repair. Lungs:  Normal respiratory effort, chest expands symmetrically. Lungs are clear to auscultation, no crackles or wheezes. Heart:  Normal rate and regular rhythm. S1 and S2 normal without gallop, murmur, click, rub or other extra sounds. Abdomen:  Bowel sounds positive,abdomen soft and non-tender without masses, organomegaly or hernias noted. Extremities:  No clubbing, cyanosis, edema, or deformity noted with normal full range of motion of all joints.     Impression & Recommendations:  Problem # 1:  ESSENTIAL HYPERTENSION, BENIGN (ICD-401.1) Assessment Unchanged no changes.   Her updated medication list for this problem includes:    Hydrochlorothiazide 12.5 Mg Caps (Hydrochlorothiazide) ..... One half tab by mouth daily for blood pressure  Orders: Comp Met-[facility] ([address]-60454) [facility]- Est  Level 4 (09811)  Problem # 2:  HYPERCHOLESTEROLEMIA (ICD-272.0) Assessment: Unchanged check FLP.  Her updated  medication list for this problem includes:    Simvastatin 80 Mg Tabs (Simvastatin) .[name]... 1 tab by mouth once daily for cholesterol  Orders: Lipid-[facility] ([address]-60454) [facility]- Est  Level 4 (09811)  Problem # 3:  DECREASED LIBIDO (ICD-799.81) Assessment: New  possibly 2/2 medications given use of two SSRIs. reinforced to patient to stop sertraline and observe. if persists, will adjust citalopram.    Orders: [facility]- Est  Level 4 (91478)  Problem # 4:  DIABETES MELLITUS, TYPE II, FAMILY HX (ICD-V18.0) Assessment: New check fasting CBG on CMP  Other Orders: CBC-[facility] (29562) Iron -[facility] [phone]) Iron Binding Cap (TIBC)-[facility] ([address]) Ferritin-[facility] [phone])  Patient Instructions: 1)  Follow up Dr. Lanier Prude in mid-June.    Prevention & Chronic Care Immunizations   Influenza vaccine: given  (01/04/2008)   Influenza vaccine deferral: Not available  (11/15/2008)   Influenza vaccine due: 01/03/2009    Tetanus booster: 06/28/2004: Done.   Tetanus booster due: 06/29/2014    Pneumococcal vaccine: Not documented    H. zoster vaccine: Not documented  Colorectal Screening   Hemoccult: negative x 3  (08/26/2007)   Hemoccult action/deferral: Not indicated  (11/15/2008)   Hemoccult due: 08/25/2008    Colonoscopy: Done.  (11/28/2004)   Colonoscopy due: 11/29/2014  Other Screening   Pap smear: normal  (01/04/2008)   Pap smear action/deferral: Not indicated S/P hysterectomy  (11/15/2008)   Pap smear due: Not Indicated    Mammogram: normal  (01/24/2008)   Mammogram due: 01/23/2009    DXA bone density scan: Not documented   Smoking status: quit  (07/12/2009)  Lipids   Total Cholesterol: 224  (01/04/2008)   LDL: 152  (01/04/2008)   LDL Direct: 140  (04/24/2008)   HDL: 53  (01/04/2008)   Triglycerides: 94  (01/04/2008)    SGOT (AST): 12  (04/24/2008)   SGPT (ALT): 15  (04/24/2008) CMP ordered    Alkaline phosphatase: 63  (04/24/2008)   Total bilirubin: 0.4  (04/24/2008)    Lipid flowsheet reviewed?: Yes   Progress toward LDL goal: Improved  Hypertension   Last Blood Pressure: 126 / 76  (07/12/2009)   Serum creatinine: 0.96  (04/24/2008)   Serum potassium 4.4  (04/24/2008) CMP ordered     Hypertension flowsheet reviewed?: Yes   Progress toward BP goal: At goal  Self-Management Support :   Personal Goals (by the next clinic visit) :      Personal blood  pressure goal: 140/90  (07/12/2009)     Personal LDL goal: 160  (11/15/2008)    Hypertension self-management support: Not documented    Hypertension self-management support not done because: Good outcomes  (07/12/2009)    Lipid self-management support: Written self-care plan  (07/12/2009)   Lipid self-care plan printed.

## 2010-05-01 NOTE — Letter (Signed)
Summary: Generic Letter  [facility]  [address]   [place], [place] [address]   Phone: [phone]  Fax: [phone]    07/23/2009  [facility] [place] [address] [place], [place]  [address]  Dear Ms. Torosyan,  It has been my pleasure to have been your physician over the last 3 years. I truly have enjoyed getting to know you and helping you with your healthcare needs. Over the next several weeks, you will be assigned a new provider at [facility]. He/She will have access to all of your records and continue to provide you with excellent care. If you have any questions, please feel free to contact our office. Sincerely,   Lequita Asal  MD  Appended Document: Generic Letter mailed

## 2010-05-01 NOTE — Progress Notes (Signed)
Summary: phn msg  Phone Note Call from Patient Call back at [phone]   Caller: Patient Summary of Call: Pt would like to be checked for diabetes.  Does Dr. Lanier Prude need to see her for this? Initial call taken by: Clydell Hakim,  June 26, 2009 11:45 AM  Follow-up for Phone Call        per Dr Lanier Prude, pt notified to sched apt with PCP, voiced understanding Follow-up by: Gladstone Pih,  June 26, 2009 12:06 PM

## 2010-07-30 ENCOUNTER — Ambulatory Visit: Payer: Self-pay | Admitting: Family Medicine

## 2010-07-31 ENCOUNTER — Encounter: Payer: Self-pay | Admitting: Family Medicine

## 2010-07-31 ENCOUNTER — Ambulatory Visit (INDEPENDENT_AMBULATORY_CARE_PROVIDER_SITE_OTHER): Payer: 59 | Admitting: Family Medicine

## 2010-07-31 VITALS — BP 118/70 | HR 81 | Temp 98.1°F | Ht 62.5 in | Wt 177.4 lb

## 2010-07-31 DIAGNOSIS — K219 Gastro-esophageal reflux disease without esophagitis: Secondary | ICD-10-CM

## 2010-07-31 DIAGNOSIS — J029 Acute pharyngitis, unspecified: Secondary | ICD-10-CM

## 2010-07-31 DIAGNOSIS — J309 Allergic rhinitis, unspecified: Secondary | ICD-10-CM | POA: Insufficient documentation

## 2010-07-31 NOTE — Progress Notes (Signed)
  Subjective:     [name] [name] is a 60 y.o. female who presents for evaluation of sore throat. Associated symptoms include dry cough, itching in eyes, nasal blockage, post nasal drip, sinus and nasal congestion and sore throat. Onset of symptoms was 3 day ago, and have been stable since that time. She is drinking plenty of fluids. She has not had a recent close exposure to someone with proven streptococcal pharyngitis. Symptoms are worse at night and worse with dry air. Reports history of GERD - takes Prilosec occasionally - and reports some heartburn symptoms, worse with lying flat. Also has history of allergic rhinitis - has been using her Flonase, just started Claritin OTC yesterday - this has helped somewhat   The following portions of the patient's history were reviewed and updated as appropriate: allergies, current medications, past family history, past medical history, past social history, past surgical history and problem list.  Review of Systems Pertinent items are noted in HPI.    Objective:    BP 118/70  Pulse 81  Temp(Src) 98.1 F (36.7 C) (Oral)  Ht 5' 2.5" (1.588 m)  Wt 177 lb 6.4 oz (80.468 kg)  BMI 31.93 kg/m2 General:  alert, cooperative and no distress  Mouth:  lips, mucosa, and tongue normal; teeth and gums normal  Neck: no adenopathy, no carotid bruit, no JVD, supple, symmetrical, trachea midline and thyroid not enlarged, symmetric, no tenderness/mass/nodules.   Laboratory Strep test done. Results:negative    Assessment:

## 2010-07-31 NOTE — Assessment & Plan Note (Addendum)
Likely contributing to symptoms of pharyngitis. Advised regarding restarting Prilosec OTC and observing for symptom improvement. Follow up with PCP as needed. Advised to avoid triggers.

## 2010-07-31 NOTE — Assessment & Plan Note (Signed)
Likely main cause of symptoms (with contribution from GERD as well). Advised to continue Flonase, continue Claritin. Follow up with PCP as needed. Advised to avoid triggers.

## 2010-08-12 NOTE — Op Note (Signed)
NAMEBRONWYN, [name] [name]            ACCOUNT NO.:  [id number]   MEDICAL RECORD NO.:  [id number]          PATIENT TYPE:  AMB   LOCATION:  ENDO                         FACILITY:  [facility]   PHYSICIAN:  Petra Kuba, M.D.    DATE OF BIRTH:  1951-03-13   DATE OF PROCEDURE:  02/16/2007  DATE OF DISCHARGE:                               OPERATIVE REPORT   PROCEDURE:  Esophagogastroduodenoscopy.   INDICATION:  Microcytic anemia, Guaiac positive activity, nondiagnostic  colon 2 years ago.  I want to make sure there was no upper tract issue.  Consent was signed after risks, benefits, methods, options thoroughly  discussed multiple times in the past and before any sedation.   MEDICINES USED:  Fentanyl 75 mcg, Versed 7 mg.   PROCEDURE:  The video endoscope was inserted by direct vision.  The  esophagus was normal.  She did have a tiny hiatal hernia.  The scope was  passed into the stomach and advanced through a normal antrum, normal  pylorus and into an normal duodenal bulb and around the celiac to a  normal second portion of the duodenum.  The scope was withdrawn back to  the bulb and a good look there ruled out abnormalities in that location.  The scope was withdrawn back into the stomach and retroflexed.  Angularis, cardia, fundus, lesser and greater curve were normal on  retroflexed visualization.  Straight visualization of the stomach did  not reveal any additional findings.  The air was suctioned.  The scope  was slowly withdrawn.  Again, a good look at the esophagus was normal.  A quick look at the vocal cords were normal.  The scope was removed.  The patient tolerated the procedure well.  There was no obvious  immediate complication.   ENDOSCOPIC DIAGNOSES:  1. Tiny hiatal hernia.  2. Otherwise normal esophagogastroduodenoscopy.   PLAN:  Followup p.r.n. or in 1 month to recheck guaiac, CBCs and decide  any further workup and plans needed.           ______________________________  Petra Kuba, M.D.     MEM/MEDQ  D:  02/16/2007  T:  02/16/2007  Job:  [id number]   cc:   Petra Kuba, M.D.  Alanson Puls, M.D.

## 2010-08-15 NOTE — Op Note (Signed)
NAMECHRISTIN, MOLINE            ACCOUNT NO.:  [id number]   MEDICAL RECORD NO.:  [id number]          PATIENT TYPE:  AMB   LOCATION:  ENDO                         FACILITY:  [facility]   PHYSICIAN:  Petra Kuba, M.D.    DATE OF BIRTH:  11/11/1950   DATE OF PROCEDURE:  12/23/2004  DATE OF DISCHARGE:                                 OPERATIVE REPORT   PROCEDURE:  Colonoscopy.   ENDOSCOPIST:  Petra Kuba, M.D.   INDICATION:  Episodic constipation, patient due for colonic screening.   Consent was signed after risks, benefits, methods and options were  thoroughly discussed in the office.   MEDICATIONS USED:  Demerol 80, Versed ___________.   PROCEDURE:  Rectal inspection was pertinent for external hemorrhoids, small.  Digital exam was slightly stenotic, but no masses.  Pediatric video  colonoscope was inserted.  She had a very tortuous __________ sigmoid turn  and to get around that required __________ back __________ abdominal  pressure in the __________ quadrant.  Once past this area __________ cecum  __________ insertion.  Cecum was identified by the appendiceal orifice and  ileocecal valve.  The scope was slowly withdrawn through the colon.  Prep  was adequate.  ___________.  Scope was withdrawn back to the rectum.  There  was no abnormality seen, specifically no polyps __________.  Once back in  the rectum, __________ hemorrhoids.  Scope was straightened __________.  The  patient tolerated the adequately.  There were no immediate procedure  complications.   ENDOSCOPIC DIAGNOSES:  1.  Small internal hemorrhoids.  2.  __________ the cecum.   PLAN:  Recheck colon screening in 5-10 years, waiting for virtual  colonoscopy if accepted by insurance at that junction.  Happy to see back  p.r.n.[name]  Return care to the medical clinic for the customary healthcare  maintenance.           ______________________________  Petra Kuba, M.D.     MEM/MEDQ  D:  12/23/2004  T:  12/24/2004   Job:  [id number]   cc:   Nani Gasser, M.D.  [phone]  [place], [place] [address]

## 2010-09-08 ENCOUNTER — Ambulatory Visit: Payer: 59 | Admitting: Family Medicine

## 2010-09-23 ENCOUNTER — Ambulatory Visit: Payer: 59 | Admitting: Family Medicine

## 2010-11-10 ENCOUNTER — Ambulatory Visit (INDEPENDENT_AMBULATORY_CARE_PROVIDER_SITE_OTHER): Payer: 59 | Admitting: Family Medicine

## 2010-11-10 ENCOUNTER — Encounter: Payer: Self-pay | Admitting: Family Medicine

## 2010-11-10 VITALS — BP 130/80 | HR 80 | Wt 178.0 lb

## 2010-11-10 DIAGNOSIS — F319 Bipolar disorder, unspecified: Secondary | ICD-10-CM

## 2010-11-10 LAB — TSH: TSH: 0.613 u[IU]/mL (ref 0.350–4.500)

## 2010-11-10 MED ORDER — LAMOTRIGINE 25 MG PO TABS: 25.0000 mg | ORAL_TABLET | Freq: Every day | ORAL | Status: DC

## 2010-11-10 NOTE — Patient Instructions (Signed)
Manic Depressive Illness (Bipolar Disorder) Manic depressive illness (manic depression) is called a bipolar disorder because patients with this illness have both ends of the range of feelings. They may feel as though they are in a deep hole during the depression phase and feel unable to get out of what they believe is a hopeless situation. During the manic phase they feel as though they are full of energy and can accomplish anything with their boundless energy. Many lives are ruined by this disease; and without effective treatment, the illness is connected with an increased risk of suicide. Bipolar disorder is a serious brain disease that causes extreme shifts in mood, energy, and functioning. It affects about 2.3 million adult Americans. This is about 1.2 percent of the population. Men and women are equally likely to develop this illness. The disorder usually starts in adolescence or early adulthood, but can start in childhood. This illness may be passed from your parents but the gene causing this illness has not been found. Cycles, or episodes, of depression, mania, or "mixed" manic and depressive symptoms often recur (come back) and may become more frequent. This illness can disrupt work, school, family, and social life. It is important to give your caregiver a complete picture of what has been happening. Help is often looked for during the depression phase. If treatment for depression is started, some of the antidepressant medications can actually make things worse. Antidepressants can trigger mania with a worsening of the illness. There are a number of medications which work well with this disease and your caregiver can help you find the medication or combination of medications which will work best for you.  SYMPTOMS MANIA Abnormally and persistently elevated (high) mood or irritability and aggressiveness, accompanied by at least three of the following symptoms:  Overly-inflated self-esteem (You think a  lot of yourself like a show-off)   Decreased need for sleep (You feel so full of energy that it seems as if you do not need sleep)   Increased talkativeness   Racing thoughts (Your ideas and thoughts may jump from one to the other in an endless stream)   Distractibility (It is difficult to keep your mind on one subject.)   Increased goal-directed activity such as shopping   Physical agitation (You may find it difficult to sit still)   Excessive involvement in risky or reckless behaviors or activities, such as spending sprees, poor business decisions, and sexual indiscretions   Poor judgment and decision making. (Your decisions are not normal or sensible)   Impulsiveness (You react quickly in an instant without thinking things through)  DEPRESSION Symptoms include:  Loss of interest or pleasure in activities that were once enjoyed   Significant change in appetite or body weight   Difficulty sleeping, or oversleeping   Physical slowing or agitation   Loss of energy   Feelings of worthlessness or inappropriate guilt   Difficulty thinking or concentrating; poor decision making abilities   Feelings of inadequacy and low self esteem (You may feel as though you are worth nothing)   Prolonged periods of sadness without apparent cause   Unexplained crying spells   Withdrawal from usual friends and family (You may spend more time alone)   Recurrent thoughts of death and suicide  MIXED STATE Symptoms of mania and depression are present at the same time. The symptom picture frequently includes:  Agitation   Trouble sleeping   Significant change in appetite   Psychosis   Suicidal thinking  BIPOLAR DISORDER  WITH RAPID RECYCLING Especially early in the course of illness, the episodes may be separated by periods of wellness during which a person suffers few to no symptoms. When four or more episodes of illness occur within a 15-month period, the person is said to have  bipolar disorder with rapid cycling. Bipolar disorder is often complicated by co-occurring alcohol or substance abuse. PSYCHOSIS Severe depression or mania may be accompanied by symptoms of psychosis. These symptoms include: hallucinations (hearing, seeing, or otherwise sensing the presence of stimuli that are not there) and delusions (false personal beliefs that are not subject to reason or contradictory evidence, and are not explained by a person's cultural concepts). Psychotic symptoms associated with bipolar typically reflect the extreme mood state at the time. TREATMENT Many of the above problems sound awful. The good news is that if you work with your caregivers and let them know what is wrong, they can usually help you.  A variety of medications are used to treat bipolar disorder, but even with the best medication treatment, many people with the illness have some residual (left over) symptoms. Certain types of psychotherapy or psychosocial interventions, in combination with medication, often can provide additional benefits. These include cognitive-behavioral therapy, interpersonal and social rhythm therapy, family therapy, and psychoeducation. Your caregiver can explain these therapies to you.   Lithium has long been used as a first-line treatment for bipolar disorder. It has been an effective mood-stabilizing medication for many people with bipolar disorder.   Some anticonvulsant medications have been used as alternatives to lithium in many cases. Some research suggests that different combinations of lithium and anticonvulsants may be helpful.   According to studies conducted in [place] in patients with epilepsy, valproate may increase testosterone levels in teenage girls and produce polycystic ovary syndrome in women who began taking the medication before age 69. Increased testosterone can lead to polycystic ovary syndrome with irregular or absent menses (menstrual periods), obesity (being very  overweight), and abnormal growth of hair. Therefore, young female patients taking valproate should be watched carefully by a physician.   During a depressive episode, people with bipolar disorder commonly require additional treatment with antidepressant medication. Typically, lithium or anticonvulsant mood stabilizers are prescribed along with an antidepressant to protect against a switch into mania or rapid cycling. In some cases, the newer, atypical antipsychotic drugs may help relieve severe symptoms of bipolar disorder and prevent the return of mania. More research is needed to establish the safety and efficacy of atypical antipsychotics as long-term treatments for this disorder.  PROGNOSIS Medications reduce the symptoms of this disorder but usually the disease is life long. Some of the complications are substance abuse, including alcoholism and suicide. RESEARCH IS BEING DONE More than two-thirds of people with bipolar disorder have at least one close relative with the disorder or with unipolar major depression. This suggests that the disease has an inherited (genetic, passed from parents) component. Studies that try to identify the genetic basis of bipolar disorder show that being open to getting this illness (susceptibility) comes from multiple genes. Scientists are continuing their search for these genes using advanced genetic methods and large samples of families affected by the illness. The researchers are hopeful that these studies and continued efforts will make it possible to develop better treatments and maybe prevention. Better understanding of how the brain works will lead to the development of new and better treatments. Document Released: 06/12/2003 Document Re-Released: 09/03/2009 [facility] Patient Information 2011 [place], [place].

## 2010-11-11 LAB — CBC
HCT: 37.8 % (ref 36.0–46.0)
Hemoglobin: 12.2 g/dL (ref 12.0–15.0)
MCH: 22.1 pg — ABNORMAL LOW (ref 26.0–34.0)
MCHC: 32.3 g/dL (ref 30.0–36.0)
MCV: 68.6 fL — ABNORMAL LOW (ref 78.0–100.0)
RDW: 15.9 % — ABNORMAL HIGH (ref 11.5–15.5)
WBC: 8.2 10*3/uL (ref 4.0–10.5)

## 2010-11-12 DIAGNOSIS — F319 Bipolar disorder, unspecified: Secondary | ICD-10-CM | POA: Insufficient documentation

## 2010-11-12 NOTE — Assessment & Plan Note (Signed)
Overall sxs consistent and concerning for sub-clinic bipolar d/o that has been unmasked from life stressors. Will place pt on trial of lamictal. CBC drawn for baseline. Discussed rash red flags. Will also set up for grief counseling. Will follow up in 2 weeks.

## 2010-11-12 NOTE — Progress Notes (Signed)
Subjective:    Patient ID: [name] [name], female    DOB: Sep 05, 1950, 60 y.o.   MRN: [id number]  HPI Pt states that 2 family members recently died and has noticed severe insomnia, tearfulness, depressed mood. Pt states that these have been longstanding issues for her in the past. However, recently deaths in the family have seemed to have made them worse.  Most prominent feature has been insomnia. This has been present since prior to recent deaths in the family. Pt also reports intermittent racing thoughts and pressured speech. Pt currently sleeping <1-2 hours nightly. No HI/SI. Pt does feel like life is worth living. Pt also reports intermittent impulsive behavior including spending majority of paycheck on frivilous materials and regretting later. No documented hx/o bipolar disorder in the family per pt.  Pt with noted hx/o anxiety and depression in the past. Pt states that she has been on mood medications in the past, but is unsure how these affected her.There have been some family members with possible depression. Pt has a daughter with similar sxs who has been informally called bipolar.    Review of Systems See HPI     Objective:   Physical Exam Gen: up in chair, NAD HEENT: NCAT, EOMI, TMs clear bilaterally CV: RRR, no murmurs auscultated PULM: CTAB, no wheezes, rales, rhoncii ABD: S/NT/+ bowel sounds  EXT: 2+ peripheral pulses     Assessment & Plan:

## 2010-11-24 ENCOUNTER — Ambulatory Visit (INDEPENDENT_AMBULATORY_CARE_PROVIDER_SITE_OTHER): Payer: 59 | Admitting: Family Medicine

## 2010-11-24 ENCOUNTER — Encounter: Payer: Self-pay | Admitting: Family Medicine

## 2010-11-24 ENCOUNTER — Other Ambulatory Visit: Payer: Self-pay | Admitting: Family Medicine

## 2010-11-24 VITALS — BP 145/81 | HR 69 | Ht 62.0 in | Wt 177.0 lb

## 2010-11-24 DIAGNOSIS — I10 Essential (primary) hypertension: Secondary | ICD-10-CM

## 2010-11-24 DIAGNOSIS — F319 Bipolar disorder, unspecified: Secondary | ICD-10-CM

## 2010-11-24 LAB — BASIC METABOLIC PANEL
CO2: 25 mEq/L (ref 19–32)
Calcium: 9.7 mg/dL (ref 8.4–10.5)
Creat: 0.95 mg/dL (ref 0.50–1.10)

## 2010-11-24 NOTE — Patient Instructions (Signed)
It was good to see you today  I am glad that your mood is doing better  I am increasing your lamictal to 50mg . Take this at night STOP the citalopram  Be sure to decrease the amount of sodium you are eating I will check some blood work today  Come back to see me in 2 weeks,  Call with any questions,  God Bless,  Doree Albee MD

## 2010-11-24 NOTE — Progress Notes (Signed)
Subjective:    Patient ID: [name] [name], female    DOB: [birth year], 60 y.o.   MRN: [id number]  HPI This is a mood follow up.Pt was recently started on lamictal out of concern for bipolar disorder. Pt states that her mood has been much improved since last clinical visit. Pt states that her agitation and focus has improved. Pt states that her conversations are also much better. Pt is still having issues with insomnia, sometimes going to bed at 2-3 am. Racing thoughts have improved.  Pt states that she has intermittently been on celexa in the past. Pt states that she has resumed taking this since her last clinical visit. Pt states that she takes this at night.  Pt is taking lamictal in the morning. Pt does report some drowsiness with taking.   HTN: Currently on HCTZ 12.5 daily. Pt is not checking BPs daily. No HA, CP, SOB. Pt does report high sodium intake including eating ham, sausage biscuits, bacon on regular basis. Pt does have a BP cuff at home that she is not using.    Review of Systems See HPI    Objective:   Physical Exam Gen: up in chair, NAD  HEENT: NCAT, EOMI CV: RRR, no murmurs auscultated  PULM: CTAB, no wheezes, rales, rhoncii  ABD: S/NT/+ bowel sounds  EXT: 2+ peripheral pulses SKIN: No rash    Assessment & Plan:

## 2010-11-24 NOTE — Assessment & Plan Note (Signed)
Elevated today in setting of high sodium intake. Discussed low salt diet. Will follow up in 2 weeks to reassess. Will consider medication change at that time if BP still elevated. (increase HCTZ)

## 2010-11-24 NOTE — Assessment & Plan Note (Addendum)
Improved with lamictal. Will increase to 50mg  nightly. No rash noted.  Instructed to d/c celexa as this may be exacerbating insomnia.

## 2010-11-25 ENCOUNTER — Ambulatory Visit: Payer: 59 | Admitting: Sports Medicine

## 2010-11-25 NOTE — Telephone Encounter (Signed)
This is Dr. [place] Blas patient.

## 2010-11-25 NOTE — Telephone Encounter (Signed)
Refill request

## 2010-11-26 ENCOUNTER — Telehealth: Payer: Self-pay | Admitting: Family Medicine

## 2010-11-26 NOTE — Telephone Encounter (Signed)
[name] [name] went to pick up rx for the LamoTrigine and the rx was never sent. Please call in med asap.  Let pt know when sent

## 2010-12-02 NOTE — Telephone Encounter (Signed)
Is having terrible nightmares when she takes the medicine Lamictal.  Is there something else she can try or does she need to be seen again.  She is still feeling the same way.  Her next appt is set for 9/10.  If she needs to come in sooner, she will be off tomorrow and Thursday.

## 2010-12-03 NOTE — Telephone Encounter (Signed)
Ms. [name] [name] is calling back because she took the medicine this morning and she is still getting that funny feeling in her face, kind of a tingling sensation around her mouth.  She isn't sure what she needs to do.  She knows she has an appt on Monday, should she continue taking the med as you instructed until the appt?

## 2010-12-03 NOTE — Telephone Encounter (Signed)
Called back to discuss meds with pt. Pt states that she has been having significant nightmares since being increased to 50mg  of lamictal. Pt was taking this at night. Pt took herself back to down to 25 mg and feels better. Pt has noticed that her mood seems to "spike" in the afternoon with increasing agitation with the 25 mg dosing. Has taken an additional 25 mg in the afternoon with some relief. Pt states that her insomnia has also improved since stopping celexa.   Instructed pt to take 50mg  during the day instead of before bedtime; instructed that this should help with nightmares and agitation. Told pt we would further discuss at appt on 9/10. Pt agreeable.

## 2010-12-04 ENCOUNTER — Telehealth: Payer: Self-pay | Admitting: Family Medicine

## 2010-12-04 MED ORDER — LAMOTRIGINE 25 MG PO TABS: 50.0000 mg | ORAL_TABLET | Freq: Every day | ORAL | Status: DC

## 2010-12-04 NOTE — Telephone Encounter (Signed)
Pt's Lamotrigine was changed by provider from 25 mg to 50 mg on 8/27 visit.  Rx sent to pharmacy was for 25mg  only.  Pharmacy need to order sent for 50mg .

## 2010-12-05 ENCOUNTER — Other Ambulatory Visit: Payer: Self-pay | Admitting: Family Medicine

## 2010-12-05 ENCOUNTER — Ambulatory Visit (INDEPENDENT_AMBULATORY_CARE_PROVIDER_SITE_OTHER): Payer: 59 | Admitting: Sports Medicine

## 2010-12-05 VITALS — BP 115/75

## 2010-12-05 DIAGNOSIS — M25519 Pain in unspecified shoulder: Secondary | ICD-10-CM

## 2010-12-05 DIAGNOSIS — M75100 Unspecified rotator cuff tear or rupture of unspecified shoulder, not specified as traumatic: Secondary | ICD-10-CM

## 2010-12-05 DIAGNOSIS — M25512 Pain in left shoulder: Secondary | ICD-10-CM | POA: Insufficient documentation

## 2010-12-05 DIAGNOSIS — IMO0002 Reserved for concepts with insufficient information to code with codable children: Secondary | ICD-10-CM

## 2010-12-05 DIAGNOSIS — M7550 Bursitis of unspecified shoulder: Secondary | ICD-10-CM | POA: Insufficient documentation

## 2010-12-05 MED ORDER — TRAMADOL HCL 50 MG PO TABS: 50.0000 mg | ORAL_TABLET | Freq: Four times a day (QID) | ORAL | Status: DC | PRN

## 2010-12-05 MED ORDER — LAMOTRIGINE 25 MG PO TABS: 50.0000 mg | ORAL_TABLET | Freq: Every day | ORAL | Status: DC

## 2010-12-05 NOTE — Telephone Encounter (Signed)
Called and spoke to pt about sxs. Pt states that she has had some intermittent dizziness with medication. No hemiparesis, blurred vision, confusion. Face tingling and nightmares have resolved.  Pt states that mood is better with medication, however medication seems to "wear off" around 6pm if she has a very stressful day.  Told pt to continue medication until follow up visit on 12/08/10 where we can discuss mood and medications. Pt agreeable. Instructed pt that if dizziness worsens, to hold on using medication. Pt agreeable to plan.

## 2010-12-05 NOTE — Assessment & Plan Note (Signed)
Consent obtained and verified. Sterile  prep. Furthur cleansed with alcohol. Topical analgesic spray: Ethyl chloride. Joint: left shoulder Approached in typical fashion with:post approach toward coracoid Completed without difficulty Meds:depomedrol 40 + lidocain 5cc Needle:25 g and 1.5 in Aftercare instructions and Red flags advised.

## 2010-12-05 NOTE — Assessment & Plan Note (Signed)
We will add some tramadol for her to use to relieve pain every 6 hours as needed  Shoulder motion exercises were given  Recheck in one month

## 2010-12-05 NOTE — Progress Notes (Signed)
Subjective:    Patient ID: [name] [name], female    DOB: 17-Sep-1950, 60 y.o.   MRN: [id number]  HPI  Pt presents to clinic for evaluation of left shoulder pain x 3 weeks.  Does not recall an injury.  Woke up with pain in the morning, unable to use left arm to push herself up from bed.  Difficulty reaching behind back to fasten bra strap, also has pain with raising arm, and radiates down arm into lt wrist at times.  When shoulder pain is severe left side of the neck is painful also.  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     Review of Systems     Objective:   Physical Exam  Normal neck ROM Has painful ARC sign on rt at 70-130 deg ER and IR strong and not painful on lt Neg push off test Painful with back scratch on lt Neg speeds and yergason's  Neg empty can on lt Painful hawkins on lt     Assessment & Plan:  Likely subdeltoid bursitis -pt will benefit from CSI which we will do today -Rx medications for pain:

## 2010-12-08 ENCOUNTER — Encounter: Payer: Self-pay | Admitting: Family Medicine

## 2010-12-08 ENCOUNTER — Ambulatory Visit (INDEPENDENT_AMBULATORY_CARE_PROVIDER_SITE_OTHER): Payer: 59 | Admitting: Family Medicine

## 2010-12-08 ENCOUNTER — Telehealth: Payer: Self-pay | Admitting: *Deleted

## 2010-12-08 ENCOUNTER — Ambulatory Visit (HOSPITAL_COMMUNITY)
Admission: RE | Admit: 2010-12-08 | Discharge: 2010-12-08 | Disposition: A | Discharge status: Home / Self Care | Payer: 59 | Source: Ambulatory Visit | Attending: Family Medicine | Admitting: Family Medicine

## 2010-12-08 ENCOUNTER — Other Ambulatory Visit: Payer: Self-pay

## 2010-12-08 VITALS — BP 127/80 | HR 61 | Temp 98.4°F | Wt 176.0 lb

## 2010-12-08 DIAGNOSIS — I10 Essential (primary) hypertension: Secondary | ICD-10-CM

## 2010-12-08 DIAGNOSIS — R42 Dizziness and giddiness: Secondary | ICD-10-CM | POA: Insufficient documentation

## 2010-12-08 DIAGNOSIS — F319 Bipolar disorder, unspecified: Secondary | ICD-10-CM

## 2010-12-08 MED ORDER — QUETIAPINE FUMARATE 50 MG PO TABS: 50.0000 mg | ORAL_TABLET | Freq: Two times a day (BID) | ORAL | Status: AC

## 2010-12-08 NOTE — Telephone Encounter (Signed)
PA required for Quetiapine fumarate . Form placed in MD box.

## 2010-12-08 NOTE — Progress Notes (Signed)
Subjective:    Patient ID: [name] [name], female    DOB: 1950/12/15, 60 y.o.   MRN: [id number]  HPI Pt is here for follow up on bipolar disorder. Pt was recently placed on lamictal for this. Pt states that her mood is much improved since taking the medication. Pt feels that her "highs" are much improved. Sleep has alos improved since being on this medication. No Hi/SI.  However pt reports significant dizziness with medication use. Pt states that onset of dizziness is usually immediately after medication is taken. Dizziness is generalized in nature, unassociated with position. No vertiginous type sxs per pt. Pt denies any falls or LOC while on medication. Pt denies any hjx/o dizziness prior to medication.   HTN: Not checking BPs daily. No HA, CO, SOB. Is trying to avoid high salt foods.  Review of Systems See HPI     Objective:   Physical Exam Gen: up in chair, NAD  HEENT: NCAT, EOMI  CV: RRR, no murmurs auscultated  PULM: CTAB, no wheezes, rales, rhoncii  ABD: S/NT/+ bowel sounds  EXT: 2+ peripheral pulses  SKIN: No rash Neuro: No focal neurological deficits, no nystagmus, dix-hallpike negative.    Assessment & Plan:

## 2010-12-08 NOTE — Assessment & Plan Note (Signed)
Likely secondary to lamictal is this is one of its major side effects. No nystagmus, dix hallpike negative.Orthostatics negative. EKG w/ sinus brady @ 56, but no Qt prolongation.  Will follow up in 2 weeks to reassess once off of lamictal.

## 2010-12-08 NOTE — Patient Instructions (Addendum)
It was good to see you today  For your mood: STOP the lamictal  I am starting you on seroquel instead.  I am also checking some lab work Be sure to take you iron daily  Come back to see me in 2 weeks,  I would like to refer you to our mood disorder clinic in the near future; Call with any questions,  Doree Albee MD

## 2010-12-08 NOTE — Assessment & Plan Note (Signed)
Will transition pt to seroquel as pt cannot tolerate SEs of lamictal. Will also check CBC to rule out agranulocytosis. Will follow up in 2 weeks. Once pt is on stable dose of mood stabilizer with pt tolertating side effect profile, would like to refer to [facility] to help with further management.

## 2010-12-08 NOTE — Assessment & Plan Note (Signed)
Stable on current regimen. Will continue to follow.

## 2010-12-09 ENCOUNTER — Emergency Department (HOSPITAL_COMMUNITY): Payer: 59

## 2010-12-09 ENCOUNTER — Encounter: Payer: Self-pay | Admitting: Family Medicine

## 2010-12-09 ENCOUNTER — Emergency Department (HOSPITAL_COMMUNITY)
Admission: EM | Admit: 2010-12-09 | Discharge: 2010-12-09 | Disposition: A | Discharge status: Home / Self Care | Payer: 59 | Attending: Emergency Medicine | Admitting: Emergency Medicine

## 2010-12-09 DIAGNOSIS — F319 Bipolar disorder, unspecified: Secondary | ICD-10-CM | POA: Insufficient documentation

## 2010-12-09 DIAGNOSIS — R0602 Shortness of breath: Secondary | ICD-10-CM | POA: Insufficient documentation

## 2010-12-09 DIAGNOSIS — R0789 Other chest pain: Secondary | ICD-10-CM | POA: Insufficient documentation

## 2010-12-09 DIAGNOSIS — I10 Essential (primary) hypertension: Secondary | ICD-10-CM | POA: Insufficient documentation

## 2010-12-09 DIAGNOSIS — R5381 Other malaise: Secondary | ICD-10-CM | POA: Insufficient documentation

## 2010-12-09 DIAGNOSIS — R42 Dizziness and giddiness: Secondary | ICD-10-CM | POA: Insufficient documentation

## 2010-12-09 LAB — CBC
HCT: 39.3 % (ref 36.0–46.0)
Hemoglobin: 12.5 g/dL (ref 12.0–15.0)
Hemoglobin: 13.1 g/dL (ref 12.0–15.0)
MCHC: 30.9 g/dL (ref 30.0–36.0)
MCHC: 33.3 g/dL (ref 30.0–36.0)
MCV: 68.2 fL — ABNORMAL LOW (ref 78.0–100.0)
RDW: 16.4 % — ABNORMAL HIGH (ref 11.5–15.5)
RDW: 15.4 % (ref 11.5–15.5)

## 2010-12-09 LAB — DIFFERENTIAL
Basophils Relative: 1 % (ref 0–1)
Eosinophils Absolute: 0.2 10*3/uL (ref 0.0–0.7)
Monocytes Absolute: 0.6 10*3/uL (ref 0.1–1.0)
Neutro Abs: 4.8 10*3/uL (ref 1.7–7.7)

## 2010-12-09 LAB — URINALYSIS, ROUTINE W REFLEX MICROSCOPIC
Glucose, UA: NEGATIVE mg/dL
Ketones, ur: NEGATIVE mg/dL
Leukocytes, UA: NEGATIVE
Nitrite: NEGATIVE
Specific Gravity, Urine: 1.012 (ref 1.005–1.030)
pH: 6.5 (ref 5.0–8.0)

## 2010-12-09 LAB — BASIC METABOLIC PANEL
BUN: 16 mg/dL (ref 6–23)
Chloride: 102 mEq/L (ref 96–112)
Creatinine, Ser: 0.98 mg/dL (ref 0.50–1.10)
GFR calc Af Amer: >60 mL/min (ref >60)
Glucose, Bld: 83 mg/dL (ref 70–99)

## 2010-12-09 NOTE — H&P (Signed)
Family Medicine Teaching [facility] Consult note   Patient name: [name] [name] Medical record number: [id number] Date of birth: October 13, 1950 Age: 60 y.o. Gender: female  Primary Care Provider: Edd Arbour, MD  Chief Complaint: Lightheadedness  History of Present Illness: [name] [name] is a 60 y.o. year old female presenting with lightheadednesss and feeling of being off-balance for past 2 weeks. These symptoms started about 1 week after initiation of lamictal for new diagnosis of bipolar disorder. She has a general feeling of "walking on air" but also has some more episodic occurences of dizzyness, shortness of breath, facial numbness, chest tightness, diaphoresis. She was evaluated at [facility] 2 days ago for these symptoms and decision was made to discontinue lamictal. AT that time, labs and EKG were negative. Today, the patient continues to feel similar sensations. Today she was walking across a parking lot and had another episode. She sat down until the feeling passed and symptoms had resolved. She drove herself home. At home she continued to not feel like herself and was worried enough that she wanted to be checked out again. She drove herself to the ED. No symptoms currently.  In the ED, the patient was asymptomatic. All lab testing, CXR, EKG were normal. Stable except for some mild hypertension, slightly above baseline.   ROS: Negative for syncope, seizure activity, LOC, vertigo, fevers, visual changes, HA, falls, one sided weakness, numbness, abdominal pain, n/v, pressured speech, overwhelming depression.   Patient Active Problem List  Diagnoses  . HYPERCHOLESTEROLEMIA  . OBESITY  . ANXIETY  . DEPRESSIVE DISORDER  . CARPAL TUNNEL SYNDROME  . ESSENTIAL HYPERTENSION  . MITRAL VALVE PROLAPSE  . GERD  . BACK PAIN, CHRONIC  . THUMB PAIN, RIGHT  . Allergic rhinitis  . Bipolar disorder  . Shoulder pain, left  . Subdeltoid bursitis  . Dizziness     Social  History: History   Social History  . Marital Status: Married    Spouse Name: N/A    Number of Children: N/A  . Years of Education: N/A   Social History Main Topics  . Smoking status: Former Smoker    Quit date: 07/30/2004  . Smokeless tobacco: Never Used  . Denies alcohol Denies drugs    .    [name]     Family History: Mother with HTN, DM, CAD  Allergies: No Known Allergies  Current Outpatient Prescriptions  Medication Sig Dispense Refill  . aspirin 81 MG EC tablet Take 81 mg by mouth daily. To help protect your heart       . cyclobenzaprine (FLEXERIL) 10 MG tablet Take 10 mg by mouth at bedtime. Take 1 tab at night for back pain and as a sleep aid       . ferrous sulfate 325 (65 FE) MG tablet Take 325 mg by mouth 2 (two) times daily. Take 1 tab 2x a day for anemia/low blood count       . fluticasone (FLONASE) 50 MCG/ACT nasal spray 1 spray by Nasal route daily. spray 1-2 sprays into both nostrils once a day for allergies       . hydrochlorothiazide 12.5 MG capsule Take 12.5 mg by mouth every morning.        . lamoTRIgine (LAMICTAL) 25 MG tablet Take 2 tablets (50 mg total) by mouth daily.  60 tablet  11  . omeprazole (PRILOSEC) 20 MG capsule Take 20 mg by mouth daily. For reflux/heartburn       . QUEtiapine (SEROQUEL) 50 MG tablet Take  1 tablet (50 mg total) by mouth 2 (two) times daily.  200 tablet  3  . simvastatin (ZOCOR) 40 MG tablet Take 40 mg by mouth at bedtime.        . traMADol (ULTRAM) 50 MG tablet Take 1 tablet (50 mg total) by mouth every 6 (six) hours as needed for pain.  90 tablet  0   Review Of Systems: Per HPI Otherwise 12 point review of systems was performed and was unremarkable.  Physical Exam: Pulse: 72  Blood Pressure: 152/82  18    O2: 100 on RA Temp: 98.3  General: alert, cooperative, appears stated age and no distress HEENT: PERRLA, extra ocular movement intact, sclera clear, anicteric, oropharynx clear, no lesions and neck supple with midline  trachea Heart: S1, S2 normal, no murmur, rub or gallop, regular rate and rhythm Lungs: clear to auscultation, no wheezes or rales and unlabored breathing Abdomen: abdomen is soft without significant tenderness, masses, organomegaly or guarding Extremities: extremities normal, atraumatic, no cyanosis or edema Skin:no rashes Neurology: normal without focal findings, mental status, speech normal, alert and oriented x3, PERLA, cranial nerves 2-12 intact, muscle tone and strength normal and symmetric, sensation grossly normal, gait and station normal, finger to nose and cerebellar exam normal and negative romberg Psych: thought content wnl. No pressured speech.  Labs and Imaging: Lab Results     Social History:  reports that she quit smoking about 6 years ago. She has never used smokeless tobacco. Her alcohol and drug histories not on file.  Allergies: No Known Allergies    Results for orders placed during the hospital encounter of 12/09/10 (from the past 48 hour(s))  DIFFERENTIAL     Status: Abnormal   Collection Time   12/09/10  3:21 PM      Component Value Range Comment   Neutrophils Relative 46  43 - 77 (%)    Lymphocytes Relative 45  12 - 46 (%)    Monocytes Relative 6  3 - 12 (%)    Eosinophils Relative 2  0 - 5 (%)    Basophils Relative 1  0 - 1 (%)    Neutro Abs 4.8  1.7 - 7.7 (K/uL)    Lymphs Abs 4.7 (*) 0.7 - 4.0 (K/uL)    Monocytes Absolute 0.6  0.1 - 1.0 (K/uL)    Eosinophils Absolute 0.2  0.0 - 0.7 (K/uL)    Basophils Absolute 0.1  0.0 - 0.1 (K/uL)    RBC Morphology POLYCHROMASIA PRESENT      Smear Review LARGE PLATELETS PRESENT     CBC     Status: Abnormal   Collection Time   12/09/10  3:21 PM      Component Value Range Comment   WBC 10.4  4.0 - 10.5 (K/uL)    RBC 5.76 (*) 3.87 - 5.11 (MIL/uL)    Hemoglobin 13.1  12.0 - 15.0 (g/dL)    HCT 40.9  81.1 - 91.4 (%)    MCV 68.2 (*) 78.0 - 100.0 (fL)    MCH 22.7 (*) 26.0 - 34.0 (pg)    MCHC 33.3  30.0 - 36.0 (g/dL)     RDW 78.2  95.6 - 21.3 (%)    Platelets 437 (*) 150 - 400 (K/uL)   BASIC METABOLIC PANEL     Status: Abnormal   Collection Time   12/09/10  3:21 PM      Component Value Range Comment   Sodium 143  135 - 145 (mEq/L)  Potassium 3.8  3.5 - 5.1 (mEq/L)    Chloride 102  96 - 112 (mEq/L)    CO2 31  19 - 32 (mEq/L)    Glucose, Bld 83  70 - 99 (mg/dL)    BUN 16  6 - 23 (mg/dL)    Creatinine, Ser 9.14  0.50 - 1.10 (mg/dL)    Calcium 78.2  8.4 - 10.5 (mg/dL)    GFR calc non Af Amer 58 (*) >60 (mL/min)    GFR calc Af Amer >60  >60 (mL/min)   CK TOTAL AND CKMB     Status: Normal   Collection Time   12/09/10  3:21 PM      Component Value Range Comment   Total CK 106  7 - 177 (U/L)    CK, MB 2.1  0.3 - 4.0 (ng/mL)    Relative Index 2.0  0.0 - 2.5    POCT I-STAT TROPONIN I     Status: Normal   Collection Time   12/09/10  3:27 PM      Component Value Range Comment   Troponin i, poc 0.00  0.00 - 0.08 (ng/mL)    Comment 3             Dg Chest 2 View  12/09/2010  *RADIOLOGY REPORT*  Clinical Data: Chest pain  CHEST - 2 VIEW  Comparison: None.  Findings: Lungs are clear. No pleural effusion or pneumothorax.  Cardiomediastinal silhouette is within normal limits.  Mild degenerative changes of the visualized thoracolumbar spine.  IMPRESSION: Normal chest radiographs.  Original Report Authenticated By: Charline Bills, M.D.   EKG: NSR, normal qtc   ROS  . Physical Exam    Assessment and Plan: [name] [name] is a 60 y.o. year old female with history of hypertension and newly diagnosed bipolar symptoms presenting with presyncope/lightheadedness likely iatrogenic secondary to lamictal. 1. Lightheadedness/presyncope. Symptoms temporally related to initiation of lamictal 3 weeks ago. They are persistent most likely due to long half life of this med. EKG, lab studies, cardiac enzymes, orthostatics, CXR have all been negative for alternative etiologies. No signs of dehydration. Less likely related to  anxiety or panic attacks. Will continue the discontinuation of lamictal. Reassured patient regarding long half life and liklihood that her symptoms will continue to improve as drug clears system. Advised plenty of fluids. Will follow up in clinic in next 1-2 days to monitor. Also discussed red flags including continued chest pain, shortness of breath, palpitations for which to return to care.  2. Bipolar. New diagnosis. No signs or symptoms of acute mania or depression currently. Will hold on initiating seroquel as the patient hasn't begun med yet and do not want to cloud the clinical picture.  3. Hypertension. Slightly above goal. Likely due to anxiety. Patient states she has recently decreased to half dose of HCTZ. This may be titrated as oupatient if necessary.  4. Dispo: Patient medically stable to go home. Patient will follow-up in [facility] in next 1-2 days to follow-up for her dizziness.  Case discussed with Attending Dr. Tawanna Cooler McDiarmid.  Dictation #:[id number]  Rabecca Birge 12/09/2010, 5:42 PM

## 2010-12-16 ENCOUNTER — Encounter: Payer: Self-pay | Admitting: Family Medicine

## 2010-12-16 ENCOUNTER — Ambulatory Visit (INDEPENDENT_AMBULATORY_CARE_PROVIDER_SITE_OTHER): Payer: 59 | Admitting: Family Medicine

## 2010-12-16 VITALS — BP 129/72 | HR 72 | Temp 98.3°F

## 2010-12-16 DIAGNOSIS — F4321 Adjustment disorder with depressed mood: Secondary | ICD-10-CM

## 2010-12-16 DIAGNOSIS — T887XXA Unspecified adverse effect of drug or medicament, initial encounter: Secondary | ICD-10-CM

## 2010-12-16 DIAGNOSIS — J309 Allergic rhinitis, unspecified: Secondary | ICD-10-CM

## 2010-12-16 NOTE — Progress Notes (Signed)
Subjective:    Patient ID: [name] [name], female    DOB: [birth year], 60 y.o.   MRN: [id number]  HPI 1. Medication reaction Was started on Lamictal for presumed mood disorder. She developed dizziness and headache. She stopped taking the Lamictal. She has restarted the citalopram with good results.  2. Allergic Sinusitis Patient suffers from allergies. She has no fever or discharge. She has no headache or neck tenderness. She is using Flonase for this with success.  3. Grief reaction In last month had death of two family members. She is in the acceptance phase, but is still tearful at times and had a panic attack recently which led her to the ER. She has no suicidal thoughts. She has family support.   Review of Systems  Constitutional: Negative for fever, chills and diaphoresis.  HENT: Positive for sinus pressure. Negative for congestion, sore throat, rhinorrhea, sneezing and postnasal drip.   Respiratory: Negative for cough and shortness of breath.   Cardiovascular: Negative for chest pain.  Gastrointestinal: Negative for nausea and vomiting.  Neurological: Positive for dizziness and headaches.  Psychiatric/Behavioral: Positive for agitation. Negative for suicidal ideas, sleep disturbance and self-injury. The patient is nervous/anxious.        Objective:   Physical Exam  Nursing note and vitals reviewed. Constitutional: She appears well-developed and well-nourished. No distress.  HENT:  Head: Normocephalic and atraumatic.  Mouth/Throat: Oropharynx is clear and moist. No oropharyngeal exudate.  Neck: Neck supple.  Cardiovascular: Normal rate, regular rhythm and normal heart sounds.   No murmur heard. Pulmonary/Chest: Effort normal and breath sounds normal. No respiratory distress. She has no wheezes.  Lymphadenopathy:    She has no cervical adenopathy.  Skin: She is not diaphoretic.       Assessment & Plan:  1. Medication reaction Stop lamictal. Restart  citalopram  2. Allergic Sinusitis C/w flonase  3. Grief reaction Valium prn Support network in place

## 2010-12-16 NOTE — Telephone Encounter (Signed)
Approval received from insurance.  Pharmacy notified. 

## 2010-12-17 ENCOUNTER — Ambulatory Visit: Payer: 59 | Admitting: Family Medicine

## 2010-12-19 NOTE — Consult Note (Signed)
[name] [name], [name] [name] NO.:  [id number]  MEDICAL RECORD NO.:  [id number]  LOCATION:  [facility]                         FACILITY:  [facility]  PHYSICIAN:  Leighton Roach McDiarmid, M.D.DATE OF BIRTH:  [birth year]  DATE OF CONSULTATION: DATE OF DISCHARGE:  12/09/2010                                CONSULTATION   This is a Family Medicine Teaching [facility] consult note.  PRIMARY CARE PROVIDER:  Edd Arbour, MD at [facility].  CHIEF COMPLAINT:  Lightheadedness.  HISTORY OF PRESENT ILLNESS:  The patient is a 60 year old female presenting with lightheadedness and feeling of being "off balance" for the last 2 weeks.  These symptoms started about 1 week after the initiation of Lamictal for new a diagnosis of bipolar disorder.  She has a generalized feeling of "walking on air," but she also has some more episodic recurrence of the dizziness, shortness of breath, facial numbness, chest tightness, and diaphoresis.  The patient was evaluated at the [facility] two days ago for these symptoms and the decision was made to discontinue Lamictal.  At that time, labs and EKG were negative.  Today, the patient continues to feel similar sensations.  She said she was walking across the parking lot and had another episode today. She drove herself home and at home, she continued to not feel like herself.  She was worried enough at that time, she decided she wanted to be checked out again. She again drove herself to the emergency department.  She has no symptoms currently.  In the emergency department, the patient was asymptomatic.  Her lab testing, chest x-ray, and EKG were within normal limits.  The patient is stable except for some mild hypertension, which is slightly above her baseline.  REVIEW OF SYSTEMS:  Negative for syncope, seizure activity, loss of consciousness, vertigo, fevers, visual changes, headache, falls, one- sided weakness, numbness, abdominal pain, nausea and  vomiting.  SOCIAL HISTORY:  The patient is married.  She is employed.  She is a former smoker, but quit in 2006.  She has not had any alcoholic beverages in the last 3 weeks.  FAMILY HISTORY:  Mother with hypertension, diabetes, and coronary artery disease.  ALLERGIES:  No known drug allergies.  MEDICATIONS AT HOME: 1. Aspirin 81 mg daily. 2. Flexeril 10 mg as needed for back pain. 3. Ferrous sulfate 325 mg p.o. b.i.d. 4. Flonase 50 mcg one to two sprays both nostrils daily. 5. Hydrochlorothiazide 12.5 mg capsule p.o. daily. 6. Lamictal 25 mg tablets daily, which was discontinued recently. 7. Prilosec 20 mg p.o. daily. 8. Seroquel 50 mg tablet p.o. b.i.d., which has not been started yet. 9. Zocor 40 mg p.o. b.i.d. 10.Ultram 50 mg by mouth every 6 h. p.r.n. for pain.  PHYSICAL EXAMINATION:  VITAL SIGNS:  Pulse 72, blood pressure 152/82, respiration rate 18, O2 sat 100% on room air, temperature 98.3. GENERAL:  The patient is alert, cooperative, and in no acute distress. HEENT:  Pupils are equal, round and reactive to light.  Extraocular movements intact.  Sclerae clear.  No lesions of mucous membranes. Oropharynx clear. NECK:  Supple. HEART:  S1-S2 normal.  No murmur, rub, or gallop.  Regular rate and rhythm.  LUNGS:  Clear to auscultation bilaterally.  No wheezes or rales. ABDOMEN:  Abdomen is soft and nontender.  No masses or guarding. EXTREMITIES:  Normal, atraumatic.  No cyanosis, no edema.  Gait normal. SKIN:  No rashes. NEUROLOGY:  Normal without focal findings.  Mental status and speech are normal.  She is alert and oriented x3.  Cranial nerves II through XII are grossly intact.  Muscle tone and strength normal and symmetric. Sensation grossly normal.  Gait, sensation normal.  Finger-to-nose and cerebellar exam normal.  Negative Romberg. PSYCHIATRY:  Thought content within normal limits.  No pressured speech.  LABORATORY DATA:  CBC showed WBC 10.4, hemoglobin 13.1,  hematocrit 39.3, platelets 437.  BMP; sodium 143, potassium 3.8, chloride 102, bicarb 31, BUN 16, creatinine 0.98, glucose 83.  Cardiac enzymes; CK-MB 2.1, total CK 103, troponin point-of-care test in 0.0.  Chest x-ray showed clear lung fields.  No pleural effusion or pneumothorax.  Cardiomediastinal silhouette is within normal limits. Mild degenerative changes of visualized thoracolumbar spine.  EKG; normal sinus rhythm, normal QTc.  ASSESSMENT AND PLAN:  [name] [name] is a 60 year old female with a history of hypertension and newly diagnosed bipolar symptoms presenting with presyncope/lightheadedness likely iatrogenic secondary to Lamictal. 1. Lightheadedness, symptoms temporarily related to initiation of     Lamictal 3 weeks ago.  They are persistent most likely due to long     half-life of this medications.  EKG, lab studies, cardiac enzymes,     orthostatics and chest x-ray have all been negative for alternative     etiologies.  No signs of dehydration.  Symptoms were less likely     related to anxiety or panic attacks.  We will continue the     discontinuation of Lamictal.  Reassure the patient regarding long     half-life and likelihood that her symptoms will continue to improve     as the drug clear system.  I advised the patient to continue plenty     of fluids and her hydrochlorothiazide.  The patient will follow up     in clinic in the next 1-2 days to monitor her symptoms.  I also     discussed reflux including continue chest pain, shortness of     breath, and palpitations, 2. Bipolar, this is a new diagnosis.  No signs or symptoms of acute     mania or depression currently.  Hold on initiating Seroquel as the     patient has not begun this medication yet and we do not want to     cause the clinical picture. 3. Hypertension, slightly above goal likely due to anxiety.  The     patient states she has been recently decreased to half a dose of     hydrochlorothiazide  12.5 mg, this may be titrated as outpatient as     necessary, although her     outpatient blood pressure recordings seem good. 4. Disposition.  The patient medically stable to go home.  The patient     will follow up in [facility] in the next 1-2 days to follow up her     dizziness.    ______________________________ Rodman Pickle, MD   ______________________________ Leighton Roach McDiarmid, M.D.    AH/MEDQ  D:  12/09/2010  T:  12/10/2010  Job:  [id number]  Electronically Signed by Rodman Pickle  on 12/15/2010 12:56:47 AM Electronically Signed by Acquanetta Belling M.D. on 12/19/2010 03:17:44 PM

## 2010-12-22 ENCOUNTER — Ambulatory Visit: Payer: 59 | Admitting: Family Medicine

## 2010-12-25 ENCOUNTER — Ambulatory Visit: Payer: 59 | Admitting: Sports Medicine

## 2011-02-04 ENCOUNTER — Ambulatory Visit (INDEPENDENT_AMBULATORY_CARE_PROVIDER_SITE_OTHER): Payer: 59 | Admitting: *Deleted

## 2011-02-04 DIAGNOSIS — Z23 Encounter for immunization: Secondary | ICD-10-CM

## 2011-02-06 ENCOUNTER — Ambulatory Visit: Payer: 59 | Admitting: Sports Medicine

## 2011-02-13 ENCOUNTER — Other Ambulatory Visit: Payer: Self-pay | Admitting: Family Medicine

## 2011-02-13 DIAGNOSIS — Z1231 Encounter for screening mammogram for malignant neoplasm of breast: Secondary | ICD-10-CM

## 2011-02-23 ENCOUNTER — Ambulatory Visit: Payer: 59 | Admitting: Sports Medicine

## 2011-02-23 ENCOUNTER — Telehealth: Payer: Self-pay | Admitting: Family Medicine

## 2011-02-23 NOTE — Telephone Encounter (Signed)
Spoke with patient . She has a productive cough now. , no fever today she states but still has chills from time to time.  Never took her temp to verify fever last week. but she felt she had fever. Appointment scheduled tomorrow .

## 2011-02-23 NOTE — Telephone Encounter (Signed)
[name] [name] is wanting to talk to a nurse about her symptoms to help her determine whether she needs to be seen.  On Thanksgiving it started with cough which caused her to lose her voice.  She had a fever on Friday and Saturday, but not since then.  She has had chills.  She feels like she might be at the end of it but doesn't want to come out unless the nurse feels like she absolutely needs to.

## 2011-02-24 ENCOUNTER — Other Ambulatory Visit: Payer: Self-pay | Admitting: Family Medicine

## 2011-02-24 ENCOUNTER — Other Ambulatory Visit: Payer: Self-pay | Admitting: Sports Medicine

## 2011-02-24 ENCOUNTER — Ambulatory Visit (INDEPENDENT_AMBULATORY_CARE_PROVIDER_SITE_OTHER): Payer: 59 | Admitting: Family Medicine

## 2011-02-24 ENCOUNTER — Encounter: Payer: Self-pay | Admitting: Family Medicine

## 2011-02-24 VITALS — BP 125/74 | HR 89 | Temp 98.5°F | Ht 62.0 in | Wt 174.0 lb

## 2011-02-24 DIAGNOSIS — J069 Acute upper respiratory infection, unspecified: Secondary | ICD-10-CM

## 2011-02-24 NOTE — Progress Notes (Signed)
  Subjective:     [name] [name] is a 60 y.o. female who presents for evaluation of symptoms of a URI. Symptoms include achiness, congestion, cough described as productive of clear sputum, purulent nasal discharge and hoarseness. Onset of symptoms was 5 days ago, and has been gradually improving since that time. Denies dyspnea, chest pain, fevers. Treatment to date: decongestants and dayquil, lemon/onion teas. 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 Overall feels her symptoms have improved but hoarseness and cough have persisted.   The following portions of the patient's history were reviewed and updated as appropriate: allergies, current medications, past family history, past medical history, past social history and problem list.  Sick contact exposure at work. Pt had flu shot this year. Does not smoke.  Review of Systems Pertinent items are noted in HPI.   Objective:    BP 125/74  Pulse 89  Temp(Src) 98.5 F (36.9 C) (Oral)  Ht 5\' 2"  (1.575 m)  Wt 174 lb (78.926 kg)  BMI 31.83 kg/m2 General appearance: alert, cooperative and no distress Head: Normocephalic, without obvious abnormality, atraumatic Eyes: negative Nose: Nares normal. Septum midline. Mucosa normal. No drainage or sinus tenderness., clear discharge Throat: lips, mucosa, and tongue normal; teeth and gums normal Lungs: clear to auscultation bilaterally Heart: regular rate and rhythm, S1, S2 normal, no murmur, click, rub or gallop   Assessment:    viral upper respiratory illness   Plan:    Discussed diagnosis and treatment of URI. Discussed the importance of avoiding unnecessary antibiotic therapy. Suggested symptomatic OTC remedies. Nasal saline spray for congestion. Follow up as needed. Call in 3 days if symptoms aren't resolving.

## 2011-02-24 NOTE — Patient Instructions (Addendum)
Your symptoms are most likely caused by a virus. You may take motrin or acetaminophen (tylenol) for your aches. If you have fevers, chest pain or shortness of breath, call clinic or go to ER.  Viral Syndrome You or your child has Viral Syndrome. It is the most common infection causing "colds" and infections in the nose, throat, sinuses, and breathing tubes. Sometimes the infection causes nausea, vomiting, or diarrhea. The germ that causes the infection is a virus. No antibiotic or other medicine will kill it. There are medicines that you can take to make you or your child more comfortable.  HOME CARE INSTRUCTIONS   Rest in bed until you start to feel better.   If you have diarrhea or vomiting, eat small amounts of crackers and toast. Soup is helpful.   Do not give aspirin or medicine that contains aspirin to children.   Only take over-the-counter or prescription medicines for pain, discomfort, or fever as directed by your caregiver.  SEEK IMMEDIATE MEDICAL CARE IF:   You or your child has not improved within one week.   You or your child has pain that is not at least partially relieved by over-the-counter medicine.   Thick, colored mucus or blood is coughed up.   Discharge from the nose becomes thick yellow or green.   Diarrhea or vomiting gets worse.   There is any major change in your or your child's condition.   You or your child develops a skin rash, stiff neck, severe headache, or are unable to hold down food or fluid.   You or your child has an oral temperature above 102 F (38.9 C), not controlled by medicine.   Your baby is older than 3 months with a rectal temperature of 102 F (38.9 C) or higher.   Your baby is 70 months old or younger with a rectal temperature of 100.4 F (38 C) or higher.  Document Released: 03/01/2006 Document Revised: 11/26/2010 Document Reviewed: 03/02/2007 [facility] Patient Information 2012 [place], [place].

## 2011-02-25 NOTE — Telephone Encounter (Signed)
Refill request

## 2011-03-03 ENCOUNTER — Encounter: Payer: Self-pay | Admitting: Sports Medicine

## 2011-03-03 ENCOUNTER — Ambulatory Visit (INDEPENDENT_AMBULATORY_CARE_PROVIDER_SITE_OTHER): Payer: 59 | Admitting: Sports Medicine

## 2011-03-03 VITALS — BP 110/60

## 2011-03-03 DIAGNOSIS — M75100 Unspecified rotator cuff tear or rupture of unspecified shoulder, not specified as traumatic: Secondary | ICD-10-CM

## 2011-03-03 DIAGNOSIS — IMO0002 Reserved for concepts with insufficient information to code with codable children: Secondary | ICD-10-CM

## 2011-03-03 DIAGNOSIS — M7550 Bursitis of unspecified shoulder: Secondary | ICD-10-CM

## 2011-03-03 MED ORDER — AMITRIPTYLINE HCL 150 MG PO TABS: 75.0000 mg | ORAL_TABLET | Freq: Every day | ORAL | Status: DC

## 2011-03-03 NOTE — Patient Instructions (Signed)
Schedule an appointment for Ultrasound of your shoulder.  Take tramadol twice a day.  Take amitriptyline at bedtime. Side effects explained.

## 2011-03-03 NOTE — Assessment & Plan Note (Signed)
Still has pain and locking of the left shoulder especially at night. She does not have a frozen shoulder or evidence of Rotator cuff injury. Her ROM has significantly improved. She will continue taking tramadol. Will start amitriptyline at night. F/u for U/S of the left shoulder. C/w with wall exercises.

## 2011-03-03 NOTE — Progress Notes (Addendum)
Subjective:    Patient ID: [name] [name], female    DOB: [birth year], 60 y.o.   MRN: [id number]  HPI Last eval. 9/7. Injection performed at that time. Patient still has pain and limited range of motion. She says that her shoulder freezes up and she has to slowly move it to open it up. She was not able to perform exercises.  Does not recall an injury.  Woke up with pain in the morning, unable to use left arm to push herself up from bed.  Difficulty reaching behind back to fasten bra strap, also has pain with raising arm, and radiates down arm into lt wrist at times.  When shoulder pain is severe left side of the neck is painful also.  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     Review of Systems No fevers, chills, weight loss.    Objective:   Physical Exam  Normal neck ROM Pain on left lateral arm raise. Pain on palpation of left deltoid. Full range of motion with passive movement of left shoulder in all directions.  Strength improved on left side with abduction and adduction of left arm.    Assessment & Plan:  Subdeltoid bursitis.

## 2011-03-11 ENCOUNTER — Ambulatory Visit: Payer: 59

## 2011-03-17 ENCOUNTER — Other Ambulatory Visit: Payer: 59 | Admitting: Sports Medicine

## 2011-03-31 ENCOUNTER — Other Ambulatory Visit: Payer: Self-pay | Admitting: Family Medicine

## 2011-04-01 ENCOUNTER — Ambulatory Visit: Payer: 59

## 2011-04-01 NOTE — Telephone Encounter (Signed)
Refill request

## 2011-04-07 ENCOUNTER — Ambulatory Visit (INDEPENDENT_AMBULATORY_CARE_PROVIDER_SITE_OTHER): Payer: 59 | Admitting: Sports Medicine

## 2011-04-07 VITALS — BP 130/64

## 2011-04-07 DIAGNOSIS — M25512 Pain in left shoulder: Secondary | ICD-10-CM

## 2011-04-07 DIAGNOSIS — M25519 Pain in unspecified shoulder: Secondary | ICD-10-CM

## 2011-04-07 NOTE — Patient Instructions (Signed)
Great to see you [name] [name], Injected your shoulder. Do the rehabilitation exercises, but start next week. Come back to see me in 3 weeks.   [name] [name]. Benjamin Stain, M.D. [facility] [address] [place] [address] [phone]

## 2011-04-07 NOTE — Progress Notes (Addendum)
   Subjective:    Patient ID: [name] [name], female    DOB: 07/27/1950, 61 y.o.   MRN: [id number]  HPI Several months history of left shoulder pain, localized over the deltoid, worse with abduction. She's been using some amitriptyline and doing if he rehabilitation exercises, but is really not a whole a better. She did have an injection in September of last year, and this helped a good amount.   Review of Systems    no fevers, chills, night sweats, weight loss. Objective:   Physical Exam Left Shoulder: Inspection reveals no abnormalities, atrophy or asymmetry. Palpation is normal with no tenderness over AC joint or bicipital groove. ROM is full in all planes. Rotator cuff strength weak to abduction. Positive Neer's, Hawkins, empty can. Speeds and Yergason's tests normal. No labral pathology noted with negative Obrien's, negative clunk and good stability. Normal scapular function observed. No painful arc and no drop arm sign. No apprehension sign  MSK US performed of: L shoulder This study was ordered, performed, and interpreted by Ihor Austin. Benjamin Stain, M.D.  Left Shoulder:   Supraspinatus:  There is significant calcific tendinopathy, and a single calcific deposit at the distal aspect of the anterior supraspinatus. There are no discrete tears noted. Infraspinatus:  Appears normal on long and transverse views. Subscapularis:  Appears normal on long and transverse views. Teres Minor:  Appears normal on long and transverse views. AC joint:  Capsule undistended, no geyser sign. Glenohumeral Joint:  Appears normal without effusion. Biceps Tendon:  Appears normal on long and transverse views, no fraying of tendon, tendon located in intertubercular groove, no subluxation with shoulder internal or external rotation. No increased power doppler signal.  IMPRESSION:  CALCIFIC TENDONITIS OF THE SUPRASPINATUS.   Real-time Ultrasound Guided Injection of: Left subacromial bursa Consent  obtained. Time-out conducted. Noted no overlying erythema, induration, or other signs of local infection. Skin prepped in a sterile fashion. Local anesthesia: Topical ethyl chloride spray. With sterile technique and under real time ultrasound guidance: The needle inserted into the subacromial bursa. Injection performed and distention of the bursa seen. Completed without difficulty Meds: 1 cc Depo-Medrol 40, 3 cc lidocaine without epinephrine . 25-gauge needle Pain immediately resolved suggesting accurate placement of the medication. Advised to call if fevers/chills, erythema, induration, drainage, or persistent bleeding. Images saved.     Assessment & Plan:   1: Shoulder pain: Symptoms suggestive of impingement syndrome, ultrasound shows calcific tendinitis in the supraspinatus. Ultrasound guided injection as above. She we'll start the rehabilitation exercises in one week. I would like to see her back in 3 weeks. She may use her nonsteroidal anti-inflammatory drug of choice.

## 2011-04-28 ENCOUNTER — Ambulatory Visit: Payer: 59 | Admitting: Sports Medicine

## 2011-05-19 ENCOUNTER — Encounter: Payer: Self-pay | Admitting: Sports Medicine

## 2011-05-19 ENCOUNTER — Ambulatory Visit (INDEPENDENT_AMBULATORY_CARE_PROVIDER_SITE_OTHER): Payer: 59 | Admitting: Sports Medicine

## 2011-05-19 VITALS — BP 118/77 | HR 75

## 2011-05-19 DIAGNOSIS — M25512 Pain in left shoulder: Secondary | ICD-10-CM

## 2011-05-19 DIAGNOSIS — M25519 Pain in unspecified shoulder: Secondary | ICD-10-CM

## 2011-05-19 NOTE — Patient Instructions (Signed)
Great to see you [name] [name],  See you on an as needed basis.   [name] [name]. [name] [name], M.D. [facility] [address], [place] [address] [phone]

## 2011-05-19 NOTE — Assessment & Plan Note (Signed)
100% resolved. RTC prn. Cont HEP on both sides.

## 2011-05-19 NOTE — Progress Notes (Signed)
Subjective:    Patient ID: [name] [name], female    DOB: 1950-12-02, 61 y.o.   MRN: [id number]  HPI  This patient comes back for followup of her right shoulder pain. I did an ultrasound guided injection into her subacromial bursa at the last visit. Today her pain is 100% resolved, she continues to do with her home exercise program on both sides. She is very happy with her results.  Review of Systems    No fevers, chills, night sweats, weight loss, chest pain, or shortness of breath.  Social History: Non-smoker. Objective:   Physical Exam General:  Well developed, well nourished, and in no acute distress. Neuro:  Alert and oriented x3, extra-ocular muscles intact. Skin: Warm and dry, no rashes noted. Respiratory:  Not using accessory muscles, speaking in full sentences. Musculoskeletal: Shoulder: Inspection reveals no abnormalities, atrophy or asymmetry. Palpation is normal with no tenderness over AC joint or bicipital groove. ROM is full in all planes. Rotator cuff strength normal throughout. No signs of impingement with negative Neer and Hawkin's tests, empty can sign. Speeds and Yergason's tests normal. No labral pathology noted with negative Obrien's, negative clunk and good stability. Normal scapular function observed. No painful arc and no drop arm sign. No apprehension sign      Assessment & Plan:

## 2011-06-09 ENCOUNTER — Ambulatory Visit (INDEPENDENT_AMBULATORY_CARE_PROVIDER_SITE_OTHER): Payer: 59 | Admitting: Sports Medicine

## 2011-06-09 VITALS — BP 120/60

## 2011-06-09 DIAGNOSIS — M25519 Pain in unspecified shoulder: Secondary | ICD-10-CM

## 2011-06-09 DIAGNOSIS — M7550 Bursitis of unspecified shoulder: Secondary | ICD-10-CM

## 2011-06-09 DIAGNOSIS — M75100 Unspecified rotator cuff tear or rupture of unspecified shoulder, not specified as traumatic: Secondary | ICD-10-CM

## 2011-06-09 DIAGNOSIS — M25512 Pain in left shoulder: Secondary | ICD-10-CM

## 2011-06-09 DIAGNOSIS — S43429A Sprain of unspecified rotator cuff capsule, initial encounter: Secondary | ICD-10-CM

## 2011-06-09 DIAGNOSIS — M75102 Unspecified rotator cuff tear or rupture of left shoulder, not specified as traumatic: Secondary | ICD-10-CM

## 2011-06-09 MED ORDER — TRAMADOL HCL 50 MG PO TABS: 50.0000 mg | ORAL_TABLET | Freq: Four times a day (QID) | ORAL | Status: DC | PRN

## 2011-06-09 MED ORDER — HYDROCODONE-ACETAMINOPHEN 5-500 MG PO TABS: 1.0000 | ORAL_TABLET | Freq: Four times a day (QID) | ORAL | Status: AC | PRN

## 2011-06-09 MED ORDER — AMITRIPTYLINE HCL 150 MG PO TABS: 150.0000 mg | ORAL_TABLET | Freq: Every day | ORAL | Status: DC

## 2011-06-09 NOTE — Assessment & Plan Note (Signed)
I would like to have her rest and do motion exercises only If improving after 2 weeks we may start more rehab  Consider NTG and I will have nurses teach her protocol in a few days if we can see if pain control improved with vicodin  Full dose of amitruptyline at night

## 2011-06-09 NOTE — Progress Notes (Signed)
Subjective:    Patient ID: [name] [name] is a 61 y.o. female.  Chief Complaint: HPI  [name] [name] is a 61yo female with a previous hx of left shoulder bursitis who has received multiple steroid injections in the past(Sept 2012, Jan 2013) who presents with a 1 day history of shoulder pain. She was previously seen on Feb 13th and was asymptomatic at that time. Yesterday, her pain returned. She can not cite any trauma or activity that might have caused her discomfort. She has significantly impaired function of her left arm(unable to hold objects, discomfort driving). She tried to relieve her pain with an amytriptyline yesterday evening with minimal success. She describes that her shoulder appears slightly swollen. She describes the pain as shooting and notes that it occaisonally will radiate down her arm.   ROS: denies fevers, SOB, chest pain; endorses some HA but only with movement of her right arm.    Social History   Occupational History  . Not on file.   Social History Main Topics  . Smoking status: Former Smoker    Quit date: 07/30/2004  . Smokeless tobacco: Never Used  . Alcohol Use: 0.0 oz/week  . Drug Use: No  . Sexually Active: Yes -- Female partner(s)    ROS  Objective:   Right Shoulder Exam  Right shoulder exam is normal.   Left Shoulder Exam   Tenderness  Left shoulder tenderness location: Tenderness with palpation of the entire anterior surface of the joint.    ROM: able to fully extend the arm, was unable to fully flex arm, unable to abduct past 30 degrees. Good pronation/suppination + empty can test, inability to externally rotate without pain, unable to perform Speed's/Yergason's tests  MSK U/S Left should with demonstratable infraspinatus tear Evidence of edema/calcifications around the supraspinatus Some additional swelling in bursa Note [place] is difficult as she cannot position her arm      Assessment:     1. Subdeltoid bursitis   2. Rotator  cuff tear        Plan:     Sling Pain medication  Motion exercises

## 2011-06-09 NOTE — Assessment & Plan Note (Signed)
This had improved a lot after last injection I suspect she had a spontaneous RC tear In reviewing her old [place] she has a lot of calcific change in most RC tendons and the infraspinatus was somewhat atrophied  Will use a sling   vicodin for pain

## 2011-06-11 ENCOUNTER — Other Ambulatory Visit: Payer: Self-pay | Admitting: *Deleted

## 2011-06-11 MED ORDER — NITROGLYCERIN 0.4 MG/HR TD PT24: MEDICATED_PATCH | TRANSDERMAL | Status: DC

## 2011-06-12 ENCOUNTER — Other Ambulatory Visit: Payer: Self-pay | Admitting: *Deleted

## 2011-06-12 MED ORDER — NITROGLYCERIN 0.4 MG/HR TD PT24: MEDICATED_PATCH | TRANSDERMAL | Status: DC

## 2011-06-23 ENCOUNTER — Ambulatory Visit: Payer: 59 | Admitting: Sports Medicine

## 2011-07-01 ENCOUNTER — Other Ambulatory Visit: Payer: Self-pay | Admitting: Family Medicine

## 2011-07-01 MED ORDER — CITALOPRAM HYDROBROMIDE 40 MG PO TABS: 40.0000 mg | ORAL_TABLET | Freq: Every day | ORAL | Status: DC

## 2011-07-07 ENCOUNTER — Other Ambulatory Visit: Payer: Self-pay | Admitting: Family Medicine

## 2011-07-07 MED ORDER — CITALOPRAM HYDROBROMIDE 40 MG PO TABS: 40.0000 mg | ORAL_TABLET | Freq: Every day | ORAL | Status: DC

## 2011-07-10 ENCOUNTER — Encounter: Payer: Self-pay | Admitting: Family Medicine

## 2011-07-10 ENCOUNTER — Ambulatory Visit (INDEPENDENT_AMBULATORY_CARE_PROVIDER_SITE_OTHER): Payer: 59 | Admitting: Family Medicine

## 2011-07-10 VITALS — BP 122/64 | HR 80 | Temp 98.0°F | Ht 62.0 in | Wt 176.0 lb

## 2011-07-10 DIAGNOSIS — E785 Hyperlipidemia, unspecified: Secondary | ICD-10-CM

## 2011-07-10 DIAGNOSIS — M75102 Unspecified rotator cuff tear or rupture of left shoulder, not specified as traumatic: Secondary | ICD-10-CM

## 2011-07-10 DIAGNOSIS — Z23 Encounter for immunization: Secondary | ICD-10-CM

## 2011-07-10 DIAGNOSIS — S43429A Sprain of unspecified rotator cuff capsule, initial encounter: Secondary | ICD-10-CM

## 2011-07-10 MED ORDER — DICLOFENAC SODIUM 75 MG PO TBEC: 75.0000 mg | DELAYED_RELEASE_TABLET | Freq: Two times a day (BID) | ORAL | Status: DC

## 2011-07-10 MED ORDER — ZOSTER VACCINE LIVE 19400 UNT/0.65ML ~~LOC~~ SOLR: 0.6500 mL | Freq: Once | SUBCUTANEOUS | Status: AC

## 2011-07-10 MED ORDER — HYDROCODONE-ACETAMINOPHEN 5-500 MG PO TABS: 1.0000 | ORAL_TABLET | Freq: Four times a day (QID) | ORAL | Status: AC | PRN

## 2011-07-10 NOTE — Progress Notes (Deleted)
Subjective:    Patient ID: [name] [name], female    DOB: [birth year], 61 y.o.   MRN: [id number]  HPI    Review of Systems     Objective:   Physical Exam        Assessment & Plan:

## 2011-07-10 NOTE — Assessment & Plan Note (Signed)
I have prescribed a short course of Vicodin for this patient, she knows not to abuse it. I have prescribed diclofenac BID  I have started a physical therapy referral to avoid frozen shoulder. She should follow up with sports medicine.

## 2011-07-10 NOTE — Progress Notes (Signed)
Subjective:    Patient ID: [name] [name], female    DOB: Jan 20, 1951, 61 y.o.   MRN: [id number]  HPI 1. Left shoulder Rotator Cuff tear Patient has been suffering with this tear for a few months. She has been seen several times by Dr. Darrick Penna and Dr. Karie Schwalbe at this sports medicine clinic. She has an ultrasound idetified rotator cuff tear. She has been using nitro patches without much improvement. She is taking tramadol and flexeril prn.  She has noticed decreased ability to move her arm above 90 degrees. She says she does exercises at home. She does not ice or take Nsaids.   Tobacco use: Patient is a non-smoker.   Review of Systems Pertinent items are noted in HPI.     Objective:   Physical Exam Filed Vitals:   07/10/11 1135  BP: 122/64  Pulse: 80  Temp: 98 F (36.7 C)  TempSrc: Oral  Height: 5\' 2"  (1.575 m)  Weight: 176 lb (79.833 kg)   Left Shoulder: TTP of shoulder generally. AROM limited at 90 degrees secondary to pain. No swelling, or warmth.     Assessment & Plan:

## 2011-07-10 NOTE — Patient Instructions (Signed)
It was great to see you today!  Schedule an appointment to see me as needed.  I have referred you to physical therapy to start rehabilitating your left shoulder.  You need Rest, ice, Pain medication, Exercise.   Meds ordered this encounter  Medications  . HYDROcodone-acetaminophen (VICODIN) 5-500 MG per tablet    Sig: Take 1 tablet by mouth every 6 (six) hours as needed for pain.    Dispense:  90 tablet    Refill:  0  . diclofenac (VOLTAREN) 75 MG EC tablet    Sig: Take 1 tablet (75 mg total) by mouth 2 (two) times daily.    Dispense:  30 tablet    Refill:  0

## 2011-08-18 ENCOUNTER — Ambulatory Visit (INDEPENDENT_AMBULATORY_CARE_PROVIDER_SITE_OTHER): Payer: 59 | Admitting: Family Medicine

## 2011-08-18 ENCOUNTER — Encounter: Payer: Self-pay | Admitting: Family Medicine

## 2011-08-18 ENCOUNTER — Ambulatory Visit (HOSPITAL_COMMUNITY)
Admission: RE | Admit: 2011-08-18 | Discharge: 2011-08-18 | Disposition: A | Discharge status: Home / Self Care | Payer: 59 | Source: Ambulatory Visit | Attending: Family Medicine | Admitting: Family Medicine

## 2011-08-18 ENCOUNTER — Telehealth: Payer: Self-pay | Admitting: *Deleted

## 2011-08-18 DIAGNOSIS — R079 Chest pain, unspecified: Secondary | ICD-10-CM | POA: Insufficient documentation

## 2011-08-18 LAB — CBC
MCHC: 31.5 g/dL (ref 30.0–36.0)
Platelets: 416 10*3/uL — ABNORMAL HIGH (ref 150–400)
RDW: 16.9 % — ABNORMAL HIGH (ref 11.5–15.5)
WBC: 7.9 10*3/uL (ref 4.0–10.5)

## 2011-08-18 LAB — COMPREHENSIVE METABOLIC PANEL
ALT: 16 U/L (ref 0–35)
AST: 12 U/L (ref 0–37)
Albumin: 4.2 g/dL (ref 3.5–5.2)
CO2: 25 mEq/L (ref 19–32)
Calcium: 9.5 mg/dL (ref 8.4–10.5)
Chloride: 108 mEq/L (ref 96–112)
Creat: 0.97 mg/dL (ref 0.50–1.10)
Potassium: 4.3 mEq/L (ref 3.5–5.3)
Sodium: 141 mEq/L (ref 135–145)
Total Protein: 6.8 g/dL (ref 6.0–8.3)

## 2011-08-18 LAB — TSH: TSH: 0.541 u[IU]/mL (ref 0.350–4.500)

## 2011-08-18 NOTE — Telephone Encounter (Signed)
Message copied by Farrell Ours on Tue Aug 18, 2011  2:40 PM ------      Message from: Macy Mis      Created: Tue Aug 18, 2011  2:16 PM       Ms. Farve wanted me to ask you about your referral for phsycial therapy- she said she never heard from them about appt.  i have flagged [facility] team to address.

## 2011-08-18 NOTE — Progress Notes (Signed)
Subjective:    Patient ID: [name] [name], female    DOB: Dec 17, 1950, 61 y.o.   MRN: [id number]  HPI 4 days of chest pain  Right sided chest, describes as dull and sharp. Last for several seconds.  Can be at rest or when walking.  No radiation, but also has left shoulder pain from rotator cuff tear (currenlty being treated with nitroglycerin patch).  No dyspnea associated with events, but has been more fatigues and tired when walking lately.    No triggering or relieving factors- goes away quickly.  Of note- has been starting Herbalife supplements, shakes, and colon cleanse.  One of the episodes was after taking a supplement.  Last episode yesterday.  Denies symptoms of reflux:  No sour taste, epigastric burning, triggered after foods or laying flat.  Is taking omeprazole.  I have reviewed patient's  PMH, FH, and Social history and Medications as related to this visit. No early cardiac disease.  Past smoker.  + HTN and HLD  Review of Systems See HPI    Objective:   Physical Exam GEN: Alert & Oriented, No acute distress CV:  Regular Rate & Rhythm, no murmur, cannot reproduce pain on palpation. Respiratory:  Normal work of breathing, CTAB Abd:  + BS, soft, no tenderness to palpation Ext: no pre-tibial edema  EKG: NSR      Assessment & Plan:

## 2011-08-18 NOTE — Patient Instructions (Signed)
I think it is low likelihood that your chest pain is due to your heart  I will check thyroid, anemia, electrolytes today  If you continue to have pain, or it worsens, let's get your heart evaluated with a stress test

## 2011-08-18 NOTE — Telephone Encounter (Signed)
LMOM for patient to call back. PT [facility]. They attempted to call patient, but no answer. Patient needs to give [place] a number that she will be able to be reached on (hopefully) this is the number I left a message on, so that an appointment can be made.

## 2011-08-18 NOTE — Assessment & Plan Note (Signed)
Chest pain after starting herbalife products.  Atypical- for cardiac chest pain.  Interestingly of note, started low dose nitroglycerin patches for tendinopathy in the past 2 months.  EKG normal.  I suspect dyspepsia or MSK spasm as likely cause.  Given possibility for electrolyte abn with cleanses, will check BMET as well as TSH, CBC given fatigue.  I advised her to discontinue herbalife products and continue to monitor symptoms closely.  If symptoms persist or worsen, advised her to let me know, would refer for further cardiac evaluation.

## 2011-08-20 ENCOUNTER — Encounter: Payer: Self-pay | Admitting: Family Medicine

## 2011-08-20 NOTE — Telephone Encounter (Signed)
LMOM for patient to call back.

## 2011-08-21 NOTE — Telephone Encounter (Signed)
lvm for patinet to call back. After numerous tries to get in touch with patient will close phone encounter

## 2011-09-03 ENCOUNTER — Ambulatory Visit: Payer: 59 | Attending: Family Medicine | Admitting: Physical Therapy

## 2011-09-03 DIAGNOSIS — M25619 Stiffness of unspecified shoulder, not elsewhere classified: Secondary | ICD-10-CM | POA: Insufficient documentation

## 2011-09-03 DIAGNOSIS — M25519 Pain in unspecified shoulder: Secondary | ICD-10-CM | POA: Insufficient documentation

## 2011-09-03 DIAGNOSIS — IMO0001 Reserved for inherently not codable concepts without codable children: Secondary | ICD-10-CM | POA: Insufficient documentation

## 2011-09-05 ENCOUNTER — Other Ambulatory Visit: Payer: Self-pay | Admitting: Family Medicine

## 2011-09-08 ENCOUNTER — Ambulatory Visit: Payer: 59

## 2011-09-09 ENCOUNTER — Ambulatory Visit: Payer: 59 | Admitting: Rehabilitative and Restorative Service Providers"

## 2011-09-15 ENCOUNTER — Ambulatory Visit: Payer: 59 | Admitting: Rehabilitative and Restorative Service Providers"

## 2011-09-17 ENCOUNTER — Ambulatory Visit: Payer: 59 | Admitting: Physical Therapy

## 2011-09-22 ENCOUNTER — Encounter: Payer: 59 | Admitting: Physical Therapy

## 2011-09-24 ENCOUNTER — Ambulatory Visit: Payer: 59 | Admitting: Physical Therapy

## 2011-09-29 ENCOUNTER — Encounter: Payer: 59 | Admitting: Physical Therapy

## 2011-09-30 ENCOUNTER — Encounter: Payer: 59 | Admitting: Physical Therapy

## 2011-10-02 ENCOUNTER — Encounter: Payer: 59 | Admitting: Physical Therapy

## 2011-10-05 ENCOUNTER — Ambulatory Visit: Payer: 59 | Attending: Family Medicine | Admitting: Physical Therapy

## 2011-10-05 DIAGNOSIS — M25619 Stiffness of unspecified shoulder, not elsewhere classified: Secondary | ICD-10-CM | POA: Insufficient documentation

## 2011-10-05 DIAGNOSIS — IMO0001 Reserved for inherently not codable concepts without codable children: Secondary | ICD-10-CM | POA: Insufficient documentation

## 2011-10-05 DIAGNOSIS — M25519 Pain in unspecified shoulder: Secondary | ICD-10-CM | POA: Insufficient documentation

## 2011-10-08 ENCOUNTER — Ambulatory Visit: Payer: 59 | Admitting: Physical Therapy

## 2011-10-12 ENCOUNTER — Encounter: Payer: 59 | Admitting: Physical Therapy

## 2011-10-15 ENCOUNTER — Encounter: Payer: 59 | Admitting: Physical Therapy

## 2011-10-19 ENCOUNTER — Encounter: Payer: 59 | Admitting: Physical Therapy

## 2011-10-21 ENCOUNTER — Ambulatory Visit: Payer: 59 | Admitting: Physical Therapy

## 2011-10-22 ENCOUNTER — Encounter: Payer: 59 | Admitting: Physical Therapy

## 2011-10-22 ENCOUNTER — Ambulatory Visit: Payer: 59

## 2011-10-28 ENCOUNTER — Ambulatory Visit: Payer: 59 | Admitting: Family Medicine

## 2011-11-02 ENCOUNTER — Encounter: Payer: Self-pay | Admitting: Family Medicine

## 2011-11-02 ENCOUNTER — Ambulatory Visit (INDEPENDENT_AMBULATORY_CARE_PROVIDER_SITE_OTHER): Payer: 59 | Admitting: Family Medicine

## 2011-11-02 VITALS — BP 132/77 | HR 76 | Temp 97.6°F | Ht 62.0 in | Wt <= 1120 oz

## 2011-11-02 DIAGNOSIS — M75100 Unspecified rotator cuff tear or rupture of unspecified shoulder, not specified as traumatic: Secondary | ICD-10-CM

## 2011-11-02 DIAGNOSIS — M75102 Unspecified rotator cuff tear or rupture of left shoulder, not specified as traumatic: Secondary | ICD-10-CM

## 2011-11-02 DIAGNOSIS — M75120 Complete rotator cuff tear or rupture of unspecified shoulder, not specified as traumatic: Secondary | ICD-10-CM

## 2011-11-02 DIAGNOSIS — F411 Generalized anxiety disorder: Secondary | ICD-10-CM

## 2011-11-02 MED ORDER — DICLOFENAC SODIUM 1 % TD GEL: 1.0000 "application " | Freq: Four times a day (QID) | TRANSDERMAL | Status: DC

## 2011-11-02 MED ORDER — DULOXETINE HCL 20 MG PO CPEP: 20.0000 mg | ORAL_CAPSULE | Freq: Every day | ORAL | Status: DC

## 2011-11-02 NOTE — Assessment & Plan Note (Signed)
Patient complains of increased anxiety last 2 weeks. Will discontinue the Celexa and start Cymbalta 20 mg daily. Also discontinued amitriptyline and tramadol to avoid drug interactions. Patient to follow up with me in 4-6 weeks.

## 2011-11-02 NOTE — Progress Notes (Signed)
Subjective:    Patient ID: [name] [name], female    DOB: 1950-09-01, 61 y.o.   MRN: [id number]  HPI  Patient presents to clinic to meet new physician.  Shoulder pain, left: Patient has been seen by previous PCP and sports medicine clinic for rotator cuff tear infraspinatus. She completed 4 weeks of physical therapy which seemed to help at the time. However, patient says her left shoulder is starting to bother her again. She has been out of tramadol and diclofenac for several weeks. She takes amitriptyline 2-3 times per week as needed for pain, but says it does not relieve pain.  She has not had a history of diabetes.  Complains of decreased ROM of left shoulder and shoulder pain with movement.  Denies any numbness or tingling of upper extremities.  Denies any associated neck pain or tension.  Anxiety/depression: Patient complains of recurrent anxiety attacks. She has had a history of anxiety/depression intermittently for 2 years. Patient was taking citalopram for about one year, but she does not think this is working anymore. In the last 2 weeks, patient has had 3 anxiety attacks. She describes them as "feeling overwhelmed, angry, feeling palpitations" and "I just need to be by myself at times." Patient's home situation has been more stressful lately - she takes care of a child with special needs and her other daughter has been arguing with her lately.  Patient denies any homicidal or suicidal ideations.  Review of Systems  Per HPI    Objective:   Physical Exam  Constitutional: No distress.  Neck: Normal range of motion. Neck supple.  Musculoskeletal:       Left shoulder: limited ROM, unable to lift left arm fully due to pain - abduction limited 90 degrees; 4/5 strength of left upper extremity; normal sensation; no warmth or swelling; no tenderness on palpation  Psychiatric: She has a normal mood and affect. Her speech is normal and behavior is normal. Thought content normal. She expresses  no homicidal and no suicidal ideation.          Assessment & Plan:

## 2011-11-02 NOTE — Patient Instructions (Addendum)
It was nice to meet you today. Stop taking Amitryptiline and Tramadol and START Cymbalta daily. You may apply Voltaren gel to shoulder. I have referred you to Physical Therapy again. If you are interested in a steroid injection - you can schedule appointment with Sports Medicine. Follow up with PCP in 4-6 weeks.

## 2011-11-02 NOTE — Assessment & Plan Note (Signed)
Patient completed 4 weeks of physical therapy, however patient thinks shoulder pain and range of motion are getting worse. Will refer to physical therapy for prolonged course for possible adhesive capsulitis. Gave prescription for Voltaren gel. Will discontinue tramadol and amitriptyline to avoid drug interactions with Cymbalta. Patient says she will follow up with sports medicine clinic.

## 2011-11-09 ENCOUNTER — Other Ambulatory Visit: Payer: Self-pay | Admitting: Family Medicine

## 2011-11-18 ENCOUNTER — Ambulatory Visit: Payer: 59 | Attending: Family Medicine | Admitting: Physical Therapy

## 2011-11-18 DIAGNOSIS — M25619 Stiffness of unspecified shoulder, not elsewhere classified: Secondary | ICD-10-CM | POA: Insufficient documentation

## 2011-11-18 DIAGNOSIS — M25519 Pain in unspecified shoulder: Secondary | ICD-10-CM | POA: Insufficient documentation

## 2011-11-18 DIAGNOSIS — IMO0001 Reserved for inherently not codable concepts without codable children: Secondary | ICD-10-CM | POA: Insufficient documentation

## 2011-11-23 ENCOUNTER — Encounter: Payer: 59 | Admitting: Rehabilitation

## 2011-11-24 ENCOUNTER — Ambulatory Visit: Payer: 59 | Admitting: Rehabilitation

## 2011-11-26 ENCOUNTER — Ambulatory Visit: Payer: 59 | Admitting: Rehabilitation

## 2011-12-01 ENCOUNTER — Ambulatory Visit: Payer: 59 | Admitting: Sports Medicine

## 2011-12-02 ENCOUNTER — Encounter: Payer: 59 | Admitting: Rehabilitation

## 2011-12-03 ENCOUNTER — Encounter: Payer: 59 | Admitting: Rehabilitative and Restorative Service Providers"

## 2011-12-08 ENCOUNTER — Telehealth: Payer: Self-pay | Admitting: Family Medicine

## 2011-12-08 ENCOUNTER — Ambulatory Visit: Payer: 59 | Attending: Family Medicine | Admitting: Rehabilitative and Restorative Service Providers"

## 2011-12-08 DIAGNOSIS — IMO0001 Reserved for inherently not codable concepts without codable children: Secondary | ICD-10-CM | POA: Insufficient documentation

## 2011-12-08 DIAGNOSIS — M25619 Stiffness of unspecified shoulder, not elsewhere classified: Secondary | ICD-10-CM | POA: Insufficient documentation

## 2011-12-08 DIAGNOSIS — M25519 Pain in unspecified shoulder: Secondary | ICD-10-CM | POA: Insufficient documentation

## 2011-12-08 NOTE — Telephone Encounter (Signed)
[name] [name] reported that the medicine prescribed for her depression is having negative effect on her.  Feels really wrung out and not able to sleep. Coming off all the new med for now until she see you next week.  If you need her to come in sooner, please call her.  She's available in the am.

## 2011-12-09 ENCOUNTER — Telehealth: Payer: Self-pay | Admitting: Family Medicine

## 2011-12-09 NOTE — Telephone Encounter (Signed)
I will see her next week. Thanks

## 2011-12-09 NOTE — Telephone Encounter (Signed)
Patient is calling back because she needs to know what to do about her nausea because she needs to eat.

## 2011-12-10 ENCOUNTER — Ambulatory Visit: Payer: 59 | Admitting: Rehabilitative and Restorative Service Providers"

## 2011-12-10 MED ORDER — ONDANSETRON HCL 4 MG PO TABS: 4.0000 mg | ORAL_TABLET | Freq: Three times a day (TID) | ORAL | Status: DC | PRN

## 2011-12-10 NOTE — Telephone Encounter (Signed)
Per [place] Dec, CMA--Dr. Tye Savoy aware of issue.  Patient has an appt here on 12/16/11.  Will route phone note to Dr. Tye Savoy and call patient back.  Gaylene Brooks, RN

## 2011-12-10 NOTE — Telephone Encounter (Signed)
Spoke to [place], [place] who informed me that patient does not want to be seen by a physician because she has to work.  I will send Zofran for nausea to her pharmacy.  Please let patient know she can pick up medication today.

## 2011-12-10 NOTE — Telephone Encounter (Signed)
Spoke with [name] this am and she is still having the problem with the nausea.  She has been unable to eat since Tuesday.  Was on some meds that made her feel very ill and unable to sleep,  Now experiencing the nausea and inability to eat.

## 2011-12-15 ENCOUNTER — Ambulatory Visit: Payer: 59 | Admitting: Family Medicine

## 2011-12-15 ENCOUNTER — Encounter: Payer: Self-pay | Admitting: Family Medicine

## 2011-12-15 ENCOUNTER — Ambulatory Visit (INDEPENDENT_AMBULATORY_CARE_PROVIDER_SITE_OTHER): Payer: 59 | Admitting: Family Medicine

## 2011-12-15 VITALS — BP 130/81 | HR 82 | Temp 99.0°F | Ht 62.0 in | Wt 177.8 lb

## 2011-12-15 DIAGNOSIS — Z23 Encounter for immunization: Secondary | ICD-10-CM

## 2011-12-15 DIAGNOSIS — F319 Bipolar disorder, unspecified: Secondary | ICD-10-CM

## 2011-12-15 MED ORDER — OLANZAPINE 5 MG PO TABS: 5.0000 mg | ORAL_TABLET | Freq: Every day | ORAL | Status: DC

## 2011-12-15 MED ORDER — FLUOXETINE HCL 20 MG PO TABS: 20.0000 mg | ORAL_TABLET | Freq: Every day | ORAL | Status: DC

## 2011-12-15 NOTE — Assessment & Plan Note (Signed)
Will start Zyprexa 5 mg and Fluoxetine 20 mg daily. Gave patient Dr. Carola Rhine number.  She seems like a good candidate for MDC. Also gave her handout for [facility]/Crisis Line. Follow up with me in 2 weeks.

## 2011-12-15 NOTE — Progress Notes (Signed)
Subjective:    Patient ID: [name] [name], female    DOB: Nov 16, 1950, 61 y.o.   MRN: [id number]  HPI  Patient presents to clinic for follow up major depression disorder, last visit was 11/02/11.  At that time, patient told me she had a history of anxiety/depression intermittently for 2 years. Patient was taking citalopram for about one year, but did not think this was working anymore.  She complained of multiple anxiety attacks - described them as "feeling overwhelmed, angry, feeling palpitations." Because Celexa was not controlling symptoms, I changed therapy to Cymbalta.  Patient took this for about 3 weeks, but developed nausea.  She stopped taking the new medication, but she still felt nauseated.   Since last visit, patient complains of "anger and temper problems."  She argues with her daughter daily and says "I have to go my bedroom to calm down."  She wants help with her anger management, because these episodes are becoming more frequent.  She does endorse racing thoughts, rapid/pressured speech.  Patient denies any homicidal or suicidal ideations.   Patient completed GAD-7, PHQ-9, and MDS today.  Results below in Problem List Overview.   Review of Systems  Per HPI.  Denies any suicidal or homicidal thoughts.    Objective:   Physical Exam  Constitutional: No distress.  Cardiovascular: Normal rate and regular rhythm.   No murmur heard. Pulmonary/Chest: Effort normal and breath sounds normal.  Psychiatric: She has a normal mood and affect. Her speech is normal and behavior is normal. She expresses no homicidal and no suicidal ideation.       Assessment & Plan:

## 2011-12-15 NOTE — Patient Instructions (Addendum)
Thanks for coming back to see me today. For your mood disorder, start taking Zyprexa 5 mg at bedtime and Fluoxetine 20 mg in the morning. Call Dr. Carola Rhine direct line if you would like to schedule an appointment. Schedule follow up appointment with me in 2 weeks. If you need more immediate counseling, you may call the Crisis Line at [facility] [place].

## 2011-12-16 ENCOUNTER — Ambulatory Visit: Payer: 59 | Admitting: Sports Medicine

## 2011-12-19 ENCOUNTER — Encounter: Payer: Self-pay | Admitting: Family Medicine

## 2011-12-31 ENCOUNTER — Ambulatory Visit (INDEPENDENT_AMBULATORY_CARE_PROVIDER_SITE_OTHER): Payer: 59 | Admitting: Sports Medicine

## 2011-12-31 ENCOUNTER — Encounter: Payer: Self-pay | Admitting: Sports Medicine

## 2011-12-31 VITALS — BP 114/75 | HR 73 | Ht 62.0 in | Wt 177.0 lb

## 2011-12-31 DIAGNOSIS — S43429A Sprain of unspecified rotator cuff capsule, initial encounter: Secondary | ICD-10-CM

## 2011-12-31 DIAGNOSIS — M75102 Unspecified rotator cuff tear or rupture of left shoulder, not specified as traumatic: Secondary | ICD-10-CM

## 2011-12-31 MED ORDER — CYCLOBENZAPRINE HCL 10 MG PO TABS: 10.0000 mg | ORAL_TABLET | Freq: Every day | ORAL | Status: DC

## 2011-12-31 MED ORDER — TRAMADOL HCL 50 MG PO TABS: 50.0000 mg | ORAL_TABLET | Freq: Three times a day (TID) | ORAL | Status: DC | PRN

## 2011-12-31 NOTE — Patient Instructions (Addendum)
Keep doing your shoulder exercises. The neck pain is likely caused by muscle spasm in your neck - do gentle neck stretches. You can use the 1/4 nitro patch over that sore muscle in your neck. Stop amitriptyline. Continue flexeril as needed. Start tramadol 1 tablet every 8hr as needed. Follow up in 4-6 weeks.

## 2011-12-31 NOTE — Progress Notes (Signed)
Subjective:    Patient ID: [name] [name], female    DOB: 01-Aug-1950, 61 y.o.   MRN: [id number]  HPI Ms. Abrahams is here today for follow up of left shoulder pain.  Reports doing much better (80% better).  Pain is located in her anterior shoulder and also has a new pulling sensation that goes into her neck with overhead movements.  Reports some "catching" in the shoulder with overhead movements.  Using 1/4 nitro patch 2-3 times per week.  Using amitriptyline and flexeril on prn basis.  Used a lidoderm patch the other night with significant improvement the next morning.    Review of Systems     Objective:   Physical Exam Gen: alert, cooperative, NAD Left Shoulder: Inspection reveals no abnormalities, atrophy or asymmetry. Palpation is normal with no tenderness over AC joint.  + tenderness over coracoid. ROM is full in flexion and extension.  Mildly limited in abduction. Rotator cuff strength normal throughout; pain with subscapularis testing. No signs of impingement with negative Neers, Hawkin's tests, empty can sign. No labral pathology noted with negative Obrien's, negative clunk and good stability. No painful arc and no drop arm sign.  Neck: Inspection normal Full range of motion in all axes     Assessment & Plan:  Left shoulder pain: Much improved. Continue home exercises.  Will continue flexeril for muscle spasms and start tramadol for pain.  Possible interactions include serotonin syndrome and seizures.  However with low dose and relatively infrequent dosing this should not be a problem.  Will have patient stop amitriptyline as that is another serotonergic medication.  Neck pain: Likely SCM muscle spasm.  Okay to use 1/4 nitro patch over muscle to help with healing.  Gentle neck stretching.  Flexeril and tramadol as above.   Reviewed drug interactions with olanzapine.  May cause some CNS sedation or psychomotor impairment in combination with flexeril and tramadol.  This will  be mitigated by prn dosing of flexeril and tramadol.

## 2011-12-31 NOTE — Assessment & Plan Note (Signed)
Clinically she seems to have responded very well to physical therapy  There is no sign of a deficit to suggest that her rotator cuff tear has not healed  Functionally she seems to be able to use the shoulder for any of her activities of daily living

## 2012-01-11 ENCOUNTER — Other Ambulatory Visit: Payer: Self-pay | Admitting: Family Medicine

## 2012-02-04 ENCOUNTER — Ambulatory Visit: Payer: 59 | Admitting: Sports Medicine

## 2012-02-18 ENCOUNTER — Other Ambulatory Visit: Payer: Self-pay | Admitting: Family Medicine

## 2012-02-18 ENCOUNTER — Ambulatory Visit (INDEPENDENT_AMBULATORY_CARE_PROVIDER_SITE_OTHER): Payer: 59 | Admitting: Sports Medicine

## 2012-02-18 ENCOUNTER — Encounter: Payer: Self-pay | Admitting: Sports Medicine

## 2012-02-18 VITALS — BP 124/74 | HR 76 | Ht 62.0 in

## 2012-02-18 DIAGNOSIS — M75102 Unspecified rotator cuff tear or rupture of left shoulder, not specified as traumatic: Secondary | ICD-10-CM

## 2012-02-18 DIAGNOSIS — S43429A Sprain of unspecified rotator cuff capsule, initial encounter: Secondary | ICD-10-CM

## 2012-02-18 MED ORDER — DICLOFENAC SODIUM 75 MG PO TBEC: 75.0000 mg | DELAYED_RELEASE_TABLET | Freq: Two times a day (BID) | ORAL | Status: DC | PRN

## 2012-02-18 NOTE — Assessment & Plan Note (Addendum)
Left shoulder pain thought related to infraspinatus partial tear (previously unable to fully visualize supraspinatus): Continues to slowly improve and patient states overall 90% improved.  Continue home exercises after completed course PT. No longer on amitriptyline or nitroglycerin patches. Can continue tramadol and flexeril on prn basis. Also gave patient diclofenac to use prn as she states seems to help more than tramadol and flexeril.

## 2012-02-18 NOTE — Progress Notes (Signed)
Subjective:    Patient ID: [name] [name], female    DOB: 1950/04/17, 61 y.o.   MRN: [id number]  HPI [name] [name] is here today for follow up of left shoulder pain.  Patients pain continues to improve now 90% better. She finished physical therapy and continues to do home exercises. She walks 2-3x a week and states that helps pain as well.  Still located anterior shoulder. Very infrequently now has a pulling sensation or catching feeling when putting arms overhead. No longer using nitroglycerin patches (used 2-3 months). For pain control, rarely using tramadol (1-2x a week). Flexeril 1x every 2 weeks. Has been out of diclofenac for 3 weeks, requests refill. When she had this, she didn't have to take tramadol or flexeril.  No longer taking amitriptyline.   Neck pain essentially resolved with occasional use of nitro patch and nitroglycerin.   Review of Systems-see HPI     Objective:   Physical Exam  BP 124/74  Pulse 76  Ht 5\' 2"  (1.575 m) Gen: alert, cooperative, NAD Left Shoulder: Inspection reveals no abnormalities, atrophy or asymmetry. Palpation is normal with no tenderness over AC joint.  + tenderness over coracoid. ROM is full in flexion and extension.  No longer limited in abduction.  Rotator cuff strength normal throughout No signs of impingement with negative Neers, empty can sign, Hawkin's.  No labral pathology noted with negative Obrien's and good stability. No painful arc and no drop arm sign.  Neck: Inspection normal Full range of motion in all axes     Assessment & Plan:  For shoulder pain, see problem oriented charting.   Neck pain: improved with stretching and NTG. Patient can continue gentle stretching, flexeril, and tramadol.

## 2012-03-01 ENCOUNTER — Ambulatory Visit (INDEPENDENT_AMBULATORY_CARE_PROVIDER_SITE_OTHER): Payer: 59 | Admitting: Family Medicine

## 2012-03-01 ENCOUNTER — Encounter: Payer: Self-pay | Admitting: Family Medicine

## 2012-03-01 VITALS — BP 132/71 | HR 97 | Temp 98.3°F | Ht 62.0 in | Wt 188.0 lb

## 2012-03-01 DIAGNOSIS — E119 Type 2 diabetes mellitus without complications: Secondary | ICD-10-CM | POA: Insufficient documentation

## 2012-03-01 DIAGNOSIS — R7303 Prediabetes: Secondary | ICD-10-CM | POA: Insufficient documentation

## 2012-03-01 DIAGNOSIS — R3129 Other microscopic hematuria: Secondary | ICD-10-CM | POA: Insufficient documentation

## 2012-03-01 DIAGNOSIS — R350 Frequency of micturition: Secondary | ICD-10-CM | POA: Insufficient documentation

## 2012-03-01 DIAGNOSIS — R300 Dysuria: Secondary | ICD-10-CM

## 2012-03-01 LAB — POCT URINALYSIS DIPSTICK
Glucose, UA: 250
Leukocytes, UA: NEGATIVE
Nitrite, UA: NEGATIVE
Urobilinogen, UA: 1

## 2012-03-01 LAB — POCT GLYCOSYLATED HEMOGLOBIN (HGB A1C): Hemoglobin A1C: 6.5

## 2012-03-01 MED ORDER — METFORMIN HCL ER 500 MG PO TB24: 500.0000 mg | ORAL_TABLET | Freq: Every day | ORAL | Status: DC

## 2012-03-01 MED ORDER — FLUTICASONE PROPIONATE 50 MCG/ACT NA SUSP: 2.0000 | Freq: Every day | NASAL | Status: DC

## 2012-03-01 NOTE — Assessment & Plan Note (Signed)
New diagnosis with a1c 6.5%.  Will start metformin 500 xr daily.   Patient agreeable to referral to diabetes education.  May be able to be controlled with low dose metformin and lifestyle modification.  Advised ok to not check home cbg at this time.

## 2012-03-01 NOTE — Assessment & Plan Note (Signed)
Trace today- no microscopic exam available in lab today.  Recommend repeating this at patient's follow-up appointment with PCP next week.

## 2012-03-01 NOTE — Patient Instructions (Addendum)
Your have diabetes- That is the reason who are urinating more frequently  Start a low dose medicine called metformin to help with this  Diet and exercise will go a long way in helping control your sugar  Keep follow-up with your primary doctor  Will refer you to diabetes education class

## 2012-03-01 NOTE — Assessment & Plan Note (Signed)
U/a shows no evidence of infection, glucose present.  a1c elevated, new diagnosis diabetes as most likley cause.  Will start treatment for hypoerglycemia.

## 2012-03-01 NOTE — Progress Notes (Signed)
Subjective:    Patient ID: [name] [name], female    DOB: [birth year], 61 y.o.   MRN: [id number]  HPI 2 days of urinary frequency.  No dysuria.  Has had some stress incontinence in the past , but now more pronounced.  Reports going to urinate 2-3 times per hour with large volume each.  No urinary hesitancy.  No abdominal pain.  Some low back pain. No increased thirst.  NO hematuria  No new medications except diclofenac for pain.  Reports being on olanzapine for 1-2 months.  No antibiotics in the past month.  Has had some constipation recently.  BM every other day.  Hard at times.    I have reviewed patient's  PMH, FH, and Social history and Medications as related to this visit. Dad with diabetes Review of SystemsNo dyspnea, LE edema, no fever.     Objective:   Physical Exam GEN: Alert & Oriented, No acute distress CV:  Regular Rate & Rhythm, no murmur Respiratory:  Normal work of breathing, CTAB Abd:  + BS, soft, no tenderness to palpation Ext: no pre-tibial edema No flank pain      Assessment & Plan:

## 2012-03-04 ENCOUNTER — Ambulatory Visit (INDEPENDENT_AMBULATORY_CARE_PROVIDER_SITE_OTHER): Payer: 59 | Admitting: Family Medicine

## 2012-03-04 ENCOUNTER — Ambulatory Visit (HOSPITAL_COMMUNITY)
Admission: RE | Admit: 2012-03-04 | Discharge: 2012-03-04 | Disposition: A | Discharge status: Home / Self Care | Payer: 59 | Source: Ambulatory Visit | Attending: Family Medicine | Admitting: Family Medicine

## 2012-03-04 ENCOUNTER — Telehealth: Payer: Self-pay | Admitting: Family Medicine

## 2012-03-04 ENCOUNTER — Encounter: Payer: Self-pay | Admitting: Family Medicine

## 2012-03-04 VITALS — BP 144/75 | HR 77 | Ht 62.0 in | Wt 190.9 lb

## 2012-03-04 DIAGNOSIS — R079 Chest pain, unspecified: Secondary | ICD-10-CM

## 2012-03-04 LAB — BASIC METABOLIC PANEL
BUN: 13 mg/dL (ref 6–23)
Calcium: 9.8 mg/dL (ref 8.4–10.5)
Glucose, Bld: 78 mg/dL (ref 70–99)
Sodium: 141 mEq/L (ref 135–145)

## 2012-03-04 NOTE — Telephone Encounter (Signed)
Patient would like to speak to the nurse about some symptoms from Metformin that she is having.

## 2012-03-04 NOTE — Telephone Encounter (Signed)
Low likelihood metformin at this low dose causing significant symptoms.  I would advise stopping medicine for now until seen.  I would advise that her symptoms be evaluated in the office ASAP, if worsening chest pain or other symptoms, to go to ER.

## 2012-03-04 NOTE — Patient Instructions (Signed)
It was nice to meet you today.  I do not think your chest pain is from your heart, but we are checking some labs.  If there is anything abnormal we will call you.    We may consider sending you to the cardiologist with your history of mitral valve prolapse to make sure this is not the cause of your chest pain, but I will discuss it with Dr. Tye Savoy.  If your chest pain gets worse or becomes more frequent, or if you start having shortness of breath, nausea, or sweating, please go to the ER over the weekend.  Otherwise, follow up with Dr. Tye Savoy on Tuesday as previously scheduled.

## 2012-03-04 NOTE — Telephone Encounter (Signed)
Patient was started on Metformin on 12/3.  She took her first dose on Wednesday and about an hour afterwards experienced a "heavy sensation" in her chest.  She says that it did eventually go away.  She had the same experience today about an hour after taking it.  Described it this time as "chest pain" but denies SOB, diaphoresis or radiating pain.  She has also been coughing a lot due to a cold last week and did not know if the chest pain was coming from sore chest wall muscles or the medication.  Told her I would discuss with Dr. Earnest Bailey who prescribed the medication and would call her back.  She has an appointment with her PCP next Tuesday.

## 2012-03-04 NOTE — Assessment & Plan Note (Addendum)
Unlikely to be cardiac in nature, TIMI score of 1. EKG completely normal. Red flags for ER return discussed with patient. Has f/u with PCP next week.  Could consider cards consult to make sure pain is not coming from her MV prolapse if patient still having pain next week.  Will have pt hold metformin until f/u so as to not cloud the picture- if CP has resolved, could restart metformin as it is likely not the actual cause of her pain.  Is already on PPI.  BP was acceptable today. Check BMET and troponin.

## 2012-03-04 NOTE — Progress Notes (Signed)
S: Pt comes in today for SDA for chest pain.  Patient with known obesity, HTN, HLD,mitral valve prolapse, anxiety and depression, as well as newly diagnosed DM. No history of cardiac surgeries.  Patient reports starting metformin 2 days ago with sensation of chest heaviness and chest pain after taking the medication.  Sharp pains, occasional, last for a few seconds, happened a few times throughout the day yesterday, in the middle of her chest.  Pain did not radiate.  No sweating, a little nausea- went away on its own.  No associated with exertion or position.  Was feeling well this morning but then started having chest pressure again this morning after taking the metformin.  Having a little SOB with the chest heaviness.  + dizziness today with movement- room spinning not pre-syncopal.  No nausea today.  Heaviness was coming and going and has now resolved completely- has not felt it in 2-3 hours.  +cough recently 2/2 cold (x1 week).  Nothing makes the pain or heaviness better or worse. No worsening with deep breaths.  No fevers/chills.   Has been taking blood pressure medicines without issue.  Is taking PPI.   TIMI score: 1   ROS: Per HPI  History  Smoking status  . Former Smoker  . Quit date: 07/30/2004  Smokeless tobacco  . Never Used    O:  Filed Vitals:   03/04/12 1329  BP: 144/75  Pulse: 77    Gen: NAD CV: RRR, soft 2/6 murmur, no reporducible chest wall pain, no JVD Pulm: CTA bilat, no wheezes or crackles Ext: Warm,  no edema  EKG: NSR, no ST changes    A/P: 61 y.o. female p/w chest pain -See problem list -f/u in next week

## 2012-03-08 ENCOUNTER — Encounter: Payer: Self-pay | Admitting: Family Medicine

## 2012-03-08 ENCOUNTER — Ambulatory Visit (INDEPENDENT_AMBULATORY_CARE_PROVIDER_SITE_OTHER): Payer: 59 | Admitting: Family Medicine

## 2012-03-08 VITALS — BP 138/62 | HR 85 | Temp 98.7°F | Wt 194.8 lb

## 2012-03-08 DIAGNOSIS — R600 Localized edema: Secondary | ICD-10-CM | POA: Insufficient documentation

## 2012-03-08 DIAGNOSIS — R350 Frequency of micturition: Secondary | ICD-10-CM

## 2012-03-08 DIAGNOSIS — R3129 Other microscopic hematuria: Secondary | ICD-10-CM

## 2012-03-08 DIAGNOSIS — R609 Edema, unspecified: Secondary | ICD-10-CM

## 2012-03-08 DIAGNOSIS — R319 Hematuria, unspecified: Secondary | ICD-10-CM

## 2012-03-08 LAB — POCT URINALYSIS DIPSTICK
Bilirubin, UA: NEGATIVE
Ketones, UA: NEGATIVE
Leukocytes, UA: NEGATIVE
Protein, UA: NEGATIVE
Spec Grav, UA: 1.02
pH, UA: 6.5

## 2012-03-08 LAB — POCT UA - MICROSCOPIC ONLY

## 2012-03-08 NOTE — Patient Instructions (Addendum)
Return to clinic soon for a pelvic exam. Practice Kegel exercises daily. We will review your lab results at next visit. For leg swelling, elevate legs over your heart and wear compression hose during the day. Schedule next appointment later this week or early next week.  Peripheral Edema You have swelling in your legs (peripheral edema). This swelling is due to excess accumulation of salt and water in your body. Edema may be a sign of heart, kidney or liver disease, or a side effect of a medication. It may also be due to problems in the leg veins. Elevating your legs and using special support stockings may be very helpful, if the cause of the swelling is due to poor venous circulation. Avoid long periods of standing, whatever the cause. Treatment of edema depends on identifying the cause. Chips, pretzels, pickles and other salty foods should be avoided. Restricting salt in your diet is almost always needed. Water pills (diuretics) are often used to remove the excess salt and water from your body via urine. These medicines prevent the kidney from reabsorbing sodium. This increases urine flow. Diuretic treatment may also result in lowering of potassium levels in your body. Potassium supplements may be needed if you have to use diuretics daily. Daily weights can help you keep track of your progress in clearing your edema. You should call your caregiver for follow up care as recommended. SEEK IMMEDIATE MEDICAL CARE IF:   You have increased swelling, pain, redness, or heat in your legs.  You develop shortness of breath, especially when lying down.  You develop chest or abdominal pain, weakness, or fainting.  You have a fever. Document Released: 04/23/2004 Document Revised: 06/08/2011 Document Reviewed: 04/03/2009 [facility] Patient Information 2013 [place], [place].

## 2012-03-08 NOTE — Assessment & Plan Note (Signed)
Trace intact hematuria found on UA again today.  Will refer to Uro-Gyn for both trace hematuria and hx of stress incontinence.  Will also send urine for culture.

## 2012-03-08 NOTE — Assessment & Plan Note (Signed)
Likely secondary to obesity vs. Chronic venous stasis.  Last BMET was normal.  No signs suggesting CHF.  Does not appear to be malnourished. - Will treat with leg elevation at night and compression hose during the day - continue HCTZ daily for Hypertension - Recheck legs in 4 weeks or sooner as needed

## 2012-03-08 NOTE — Assessment & Plan Note (Addendum)
Urinary incontinence likely secondary to stress or mixed incontinence vs. UTI vs. New dx DM. - Will send urine for culture today - Continue to treat DM with Metformin - Gave patient handout with info about stress incontinence, Kegel exercises - Patient will return soon for pelvic exam to examine pelvic floor anatomy - If no improvement in 4 weeks, will refer to Uro or Gyn

## 2012-03-08 NOTE — Progress Notes (Signed)
Subjective:    Patient ID: [name] [name], female    DOB: 1950/07/01, 61 y.o.   MRN: [id number]  HPI  Urinary incontinence: Patient complains of urinary incontinence, mostly during the daytime.  Onset 1-2 months ago.  Progressively worsening.  A few weeks ago, she states that she had a URI with cough which caused her to soak her underwear every time she had a coughing spell.  Patient recently diagnosed with DM (A1c 6.5) and started taking Metformin.  Denies any dysuria or burning with urination.  Peripheral Edema: Patient has been retaining fluid in bilateral ankles/feet x 2-3 weeks.  Recently started on Metformin for newly dx DM.  Patient has not tried elevating legs at night.  She takes HCTZ daily for HTN.  Patient denies decrease in urinary frequency, actually she has been complaining of urinary frequency and incontinence.  Denies chronic use of NSAIDs.  Hematuria: trace hematuria found on last UA.  Patient here for repeat UA and micro.  Patient denies any dysuria or gross hematuria.  Denies any pelvic pain.  Review of Systems  Per HPI    Objective:   Physical Exam  Constitutional: She appears well-nourished. No distress.  Abdominal: Soft. Bowel sounds are normal. She exhibits no distension. There is no tenderness. There is no rebound.  Genitourinary:       Pelvic exam: deferred today due to patient preference, she plans to return later this week  Musculoskeletal:       2+ pitting pedal edema mostly around ankles bilaterally; no open wounds, ulcers, or skin weeping  Skin: Skin is dry.      Assessment & Plan:

## 2012-03-14 ENCOUNTER — Ambulatory Visit (INDEPENDENT_AMBULATORY_CARE_PROVIDER_SITE_OTHER): Payer: 59 | Admitting: Family Medicine

## 2012-03-14 ENCOUNTER — Encounter: Payer: Self-pay | Admitting: Family Medicine

## 2012-03-14 ENCOUNTER — Other Ambulatory Visit (HOSPITAL_COMMUNITY)
Admission: RE | Admit: 2012-03-14 | Discharge: 2012-03-14 | Disposition: A | Discharge status: Home / Self Care | Payer: 59 | Source: Ambulatory Visit | Attending: Family Medicine | Admitting: Family Medicine

## 2012-03-14 VITALS — BP 137/75 | HR 85 | Temp 98.5°F | Ht 61.5 in | Wt 192.9 lb

## 2012-03-14 DIAGNOSIS — Z1151 Encounter for screening for human papillomavirus (HPV): Secondary | ICD-10-CM | POA: Insufficient documentation

## 2012-03-14 DIAGNOSIS — Z124 Encounter for screening for malignant neoplasm of cervix: Secondary | ICD-10-CM

## 2012-03-14 DIAGNOSIS — N898 Other specified noninflammatory disorders of vagina: Secondary | ICD-10-CM

## 2012-03-14 DIAGNOSIS — Z01419 Encounter for gynecological examination (general) (routine) without abnormal findings: Secondary | ICD-10-CM | POA: Insufficient documentation

## 2012-03-14 DIAGNOSIS — R350 Frequency of micturition: Secondary | ICD-10-CM

## 2012-03-14 DIAGNOSIS — Z0000 Encounter for general adult medical examination without abnormal findings: Secondary | ICD-10-CM

## 2012-03-14 DIAGNOSIS — N393 Stress incontinence (female) (male): Secondary | ICD-10-CM

## 2012-03-14 LAB — POCT WET PREP (WET MOUNT): Clue Cells Wet Prep Whiff POC: POSITIVE

## 2012-03-14 NOTE — Assessment & Plan Note (Signed)
Will perform wet prep today.  Patient also requesting pap smear done even though she has had a partial hysterectomy.  Will notify of results.

## 2012-03-14 NOTE — Progress Notes (Signed)
Subjective:    Patient ID: [name] [name], female    DOB: 11-22-1950, 61 y.o.   MRN: [id number]  HPI  Vaginal Discharge: started 1-2 weeks ago.  Describes as yellow mucous, thick with foul odor.  Staining her underwear.  Denies any vaginal bleeding, dysuria, burning with urination.  Denies any vaginal itching.  She is sexually active with one partner (husband).  She has had a partial hysterectomy but requesting to have pap done anyway.    Of note, continues to complain of urinary incontinence with coughing.  She has been doing pelvic exercises which seems to be helping some.  Denies any fever, chills, nausea or vomiting.   Review of Systems  Per HPI    Objective:   Physical Exam  Constitutional: No distress.  Abdominal: Soft. She exhibits no distension. There is no tenderness.  Genitourinary: There is no rash, tenderness, lesion or injury on the right labia. There is no rash, tenderness, lesion or injury on the left labia. No erythema, tenderness or bleeding around the vagina. Vaginal discharge found.       Assessment & Plan:

## 2012-03-14 NOTE — Patient Instructions (Addendum)
It was nice to see you today, [name] [name]. Please refer to handout below regarding common causes of vaginitis. It typically takes about 1-2 days for results to return. If today's lab results are ABNORMAL, we will call you and send medication to your pharmacy.   If you develop worsening symptoms, fever (temperature > 101.5 degrees), nausea/vomiting, or pelvic pain, please return to clinic.  What is vaginitis? Vaginitis is an inflammation of the vagina. Vulvovaginitis refers to inflammation of both the vagina and vulva (the external female genitals).   What are the most common types of vaginitis? The six most common types of vaginitis are:  Candida or "yeast" vaginitis  Bacterial vaginosis  Trichomoniasis vaginitis (a sexually transmitted infection)   What are candida or "yeast" infections? Yeast infections of the vagina are what most women think of when they hear the term "vaginitis." Yeast infections are caused by one of the many species of fungus called candida. Candida normally live in small numbers in the vagina, as well as in the mouth and digestive tract of both men and women.  Yeast infections produce a thick, white vaginal discharge with the consistency of cottage cheese. Although the discharge can be somewhat watery, it is odorless. Yeast infections usually cause the vagina and the vulva to be very itchy and red, even before the onset of discharge.  If yeast is normal in a woman's vagina, what makes it cause an infection? Usually, infection occurs when a change in the delicate balance in a woman's system takes place.   What is bacterial vaginosis? Although "yeast" is the name most women know, bacterial vaginosis (BV) actually is the most common vaginal infection in women of reproductive age. Bacterial vaginosis often will cause a vaginal discharge. The discharge usually is thin and milky, and is described as having a "fishy" odor. This odor may become more noticeable after intercourse.   Redness or itching of the vagina are not common symptoms of bacterial vaginosis. Some women with BV have no symptoms at all, and the vaginitis is only discovered during a routine gynecologic exam. Bacterial vaginosis is caused by a combination of several bacteria. These bacteria seem to overgrow in much the same way as do candida when the vaginal pH balance is upset.  Because bacterial vaginosis is caused by bacteria and not by yeast, medicine that is appropriate for yeast is not effective against the bacteria that cause bacterial vaginosis.  BV is treated in asymptomatic females of child bearing age to decrease their risk of preterm delivery.  Risk factors for BV include:  New or multiple sexual partners  Douching  Cigarette smoking  What is trichomoniasis? Trichomoniasis - Trichomoniasis is caused by a tiny single-celled organism known as a "protozoa." When this organism infects the vagina, it can cause a frothy, greenish-yellow discharge. Often this discharge will have a foul smell. Women with trichomonal vaginitis may complain of itching and soreness of the vagina and vulva, as well as burning during urination. In addition, there can be discomfort in the lower abdomen and vaginal pain with intercourse. These symptoms may be worse after the menstrual period. Many women, however, do not develop any symptoms. It is important to understand that this type of vaginitis can be transmitted through sexual intercourse. For treatment to be effective, the sexual partner must be treated at the same time as the patient.

## 2012-03-14 NOTE — Assessment & Plan Note (Signed)
Will refer to [facility] to discuss options for stress incontinence.

## 2012-03-17 MED ORDER — METRONIDAZOLE 500 MG PO TABS: 500.0000 mg | ORAL_TABLET | Freq: Two times a day (BID) | ORAL | Status: DC

## 2012-03-17 NOTE — Addendum Note (Signed)
Addended by: Tye Savoy, Atthew Coutant on: 03/17/2012 01:45 PM   Modules accepted: Orders

## 2012-03-24 ENCOUNTER — Encounter: Payer: Self-pay | Admitting: Family Medicine

## 2012-05-01 ENCOUNTER — Other Ambulatory Visit: Payer: Self-pay | Admitting: Family Medicine

## 2012-05-16 ENCOUNTER — Other Ambulatory Visit: Payer: Self-pay | Admitting: Family Medicine

## 2012-05-23 ENCOUNTER — Ambulatory Visit: Payer: 59 | Admitting: Family Medicine

## 2012-05-24 ENCOUNTER — Ambulatory Visit: Payer: 59 | Admitting: Family Medicine

## 2012-06-27 ENCOUNTER — Other Ambulatory Visit: Payer: Self-pay | Admitting: Family Medicine

## 2012-07-01 ENCOUNTER — Encounter: Payer: Self-pay | Admitting: Family Medicine

## 2012-07-01 ENCOUNTER — Ambulatory Visit (INDEPENDENT_AMBULATORY_CARE_PROVIDER_SITE_OTHER): Payer: 59 | Admitting: Family Medicine

## 2012-07-01 VITALS — BP 116/72 | HR 69 | Ht 62.0 in | Wt 192.0 lb

## 2012-07-01 DIAGNOSIS — M25519 Pain in unspecified shoulder: Secondary | ICD-10-CM

## 2012-07-01 DIAGNOSIS — M25511 Pain in right shoulder: Secondary | ICD-10-CM

## 2012-07-01 NOTE — Progress Notes (Signed)
Subjective:    Patient ID: [name] [name], female    DOB: [birth year], 62 y.o.   MRN: [id number]  HPI Right shoulder pain for the last month. No specific injury. She is right-hand dominant. Reaching forward or picking up something heavy causes her the most pain. Pain is fairly constant throughout the day and occasionally keeps her from going to sleep at night. 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 Has noted no numbness in her hand.   Review of Systems Denies unusual weight change. No weakness in her arm.    Objective:   Physical Exam  Vital signs are reviewed GENERAL: Well-developed female no acute distress SHOULDER: Right. Palpation over the a.c. joint. Crossover test positive. Prostate is positive. Supraspinatus testing is painful but I think her strength is intact although it's somewhat limited by her pain level. On the left side she can put her hand at T12 on the right side she can barely get it to L2 for a check on internal rotation. Strength is intact otherwise in all planes of the rotator cuff. ; A.c. joint shows quite a bit of arthritic change and some small amount of fluid the shoulder reveals several areas of calcification in the subscapularis and supraspinatus. I see no overt rotator cuff tear. She does have some muscular atrophy. The biceps tendon appears normal in placement and anatomy.  INJECTION: Patient was given informed consent, signed copy in the chart. Appropriate time out was taken. Area prepped and draped in usual sterile fashion. One cc of methylprednisolone 40 mg/ml plus  4 cc of 1% lidocaine without epinephrine was injected into the right subacromial bursa using a(n) posterior approach. The patient tolerated the procedure well. There were no complications. Post procedure instructions were given.\ INJECTION: Patient was given informed consent, signed copy in the chart. Appropriate time out was taken. Area prepped and draped in  usual sterile fashion. One cc of methylprednisolone 40 mg/ml plus  one cc of 1% lidocaine without epinephrine was injected into the right a.c. joint using a(n) perpendicular approach. The patient tolerated the procedure well. There were no complications. Post procedure instructions were given.       Assessment & Plan:

## 2012-08-11 ENCOUNTER — Other Ambulatory Visit: Payer: Self-pay | Admitting: Sports Medicine

## 2012-08-12 ENCOUNTER — Encounter: Payer: Self-pay | Admitting: Family Medicine

## 2012-08-12 ENCOUNTER — Ambulatory Visit (INDEPENDENT_AMBULATORY_CARE_PROVIDER_SITE_OTHER): Payer: 59 | Admitting: Family Medicine

## 2012-08-12 ENCOUNTER — Other Ambulatory Visit: Payer: Self-pay | Admitting: Family Medicine

## 2012-08-12 VITALS — BP 132/65 | HR 77 | Temp 98.2°F | Ht 62.0 in | Wt 192.0 lb

## 2012-08-12 DIAGNOSIS — L738 Other specified follicular disorders: Secondary | ICD-10-CM

## 2012-08-12 DIAGNOSIS — R609 Edema, unspecified: Secondary | ICD-10-CM

## 2012-08-12 DIAGNOSIS — L853 Xerosis cutis: Secondary | ICD-10-CM

## 2012-08-12 MED ORDER — MUPIROCIN CALCIUM 2 % EX CREA: TOPICAL_CREAM | Freq: Three times a day (TID) | CUTANEOUS | Status: DC

## 2012-08-12 MED ORDER — HYDROCHLOROTHIAZIDE 25 MG PO TABS: 25.0000 mg | ORAL_TABLET | Freq: Every day | ORAL | Status: DC

## 2012-08-12 NOTE — Assessment & Plan Note (Signed)
Chronic issue likely due to chronic venous stasis, obesity, and standing at work for a long time.  Kidney function normal.  No other signs of CHF.  BP well controlled. - Will increase HCTZ to 25 mg daily - Encouraged leg elevation at bedtime - Patient going to the beach soon and does not want to wear compression hose - Follow up in one month or sooner as needed

## 2012-08-12 NOTE — Progress Notes (Signed)
Subjective:    Patient ID: [name] [name], female    DOB: June 20, 1950, 62 y.o.   MRN: [id number]  HPI  Feet swelling, bilaterally: this has been a chronic issue (1-2 months), but worsened in the last few days.  Swelling occurs during day and night, but is worse at night.  Patient elevated feet at bedtime for the first time last night.  Patient says swelling is getting better.  Denies any chest pain or difficulty breathing.  Denies any leg pain.  She says left is usually more swollen, but denies redness or calf pain.  She is going to the beach next week and does not want to wear compression hose yet.  Dry skin on hands/legs: Dry skin started within the last month.  Dry patches on right hand, specifically thumb.  She has tried Vaseline lotion, but lesion keeps coming back.  She recalls rubbing thumb on seatbelt which caused irritation.  She constantly picks at scabs.  Denies any associated fevers, chills, N/V.  Review of Systems Per HPI    Objective:   Physical Exam  Constitutional: She appears well-nourished. No distress.  Cardiovascular: Normal rate.   Pulmonary/Chest: Effort normal and breath sounds normal.  Skin:  Right thumb: healing wound on palmar aspect, no bleeding or drainage; no erythema  Dry skin patches on RT hand and wrist MSK: 2+ pitting edema bilateral mostly at ankles and shins; no skin breakdown; strong pulses; no calf tenderness, no Homan's sign    Assessment & Plan:

## 2012-08-12 NOTE — Assessment & Plan Note (Signed)
Recommended lotion and Vaseline BID.  Sent Bactroban for lesion on thumb that looks it was an open wound that is healing.  Follow up as needed.

## 2012-08-12 NOTE — Patient Instructions (Addendum)
Elevate both feet over your heart EVERY night. Start taking HCTZ 25 mg everyday.   Try to restrict sodium and follow the DASH diet. Return to clinic in ONE month to discuss mood medications. In the meantime...have fun at the beach next week!!  Peripheral Edema You have swelling in your legs (peripheral edema). This swelling is due to excess accumulation of salt and water in your body. Edema may be a sign of heart, kidney or liver disease, or a side effect of a medication. It may also be due to problems in the leg veins. Elevating your legs and using special support stockings may be very helpful, if the cause of the swelling is due to poor venous circulation. Avoid long periods of standing, whatever the cause. Treatment of edema depends on identifying the cause. Chips, pretzels, pickles and other salty foods should be avoided. Restricting salt in your diet is almost always needed. Water pills (diuretics) are often used to remove the excess salt and water from your body via urine. These medicines prevent the kidney from reabsorbing sodium. This increases urine flow. Diuretic treatment may also result in lowering of potassium levels in your body. Potassium supplements may be needed if you have to use diuretics daily. Daily weights can help you keep track of your progress in clearing your edema. You should call your caregiver for follow up care as recommended. SEEK IMMEDIATE MEDICAL CARE IF:   You have increased swelling, pain, redness, or heat in your legs.  You develop shortness of breath, especially when lying down.  You develop chest or abdominal pain, weakness, or fainting.  You have a fever. Document Released: 04/23/2004 Document Revised: 06/08/2011 Document Reviewed: 04/03/2009 [facility] Patient Information 2013 [place], [place].

## 2012-08-23 ENCOUNTER — Other Ambulatory Visit: Payer: Self-pay | Admitting: Family Medicine

## 2012-08-24 ENCOUNTER — Other Ambulatory Visit: Payer: Self-pay | Admitting: Family Medicine

## 2012-09-01 ENCOUNTER — Other Ambulatory Visit: Payer: Self-pay | Admitting: Sports Medicine

## 2012-09-01 NOTE — Telephone Encounter (Signed)
Approved per Dr. Darrick Penna.

## 2012-10-18 ENCOUNTER — Encounter: Payer: Self-pay | Admitting: Family Medicine

## 2012-10-18 ENCOUNTER — Ambulatory Visit (INDEPENDENT_AMBULATORY_CARE_PROVIDER_SITE_OTHER): Payer: 59 | Admitting: Family Medicine

## 2012-10-18 VITALS — BP 124/62 | HR 78 | Ht 62.0 in | Wt 186.0 lb

## 2012-10-18 DIAGNOSIS — E119 Type 2 diabetes mellitus without complications: Secondary | ICD-10-CM

## 2012-10-18 DIAGNOSIS — I10 Essential (primary) hypertension: Secondary | ICD-10-CM

## 2012-10-18 DIAGNOSIS — F410 Panic disorder [episodic paroxysmal anxiety] without agoraphobia: Secondary | ICD-10-CM

## 2012-10-18 MED ORDER — FLUOXETINE HCL 20 MG PO CAPS: 40.0000 mg | ORAL_CAPSULE | Freq: Every day | ORAL | Status: DC

## 2012-10-18 NOTE — Assessment & Plan Note (Signed)
HgbA1C: 6.1, Currently has lost weight and maintaining a good diet. No change in her medication. She is not having any side effects from her medication. She is compliant and takes it everyday.

## 2012-10-18 NOTE — Patient Instructions (Addendum)
Nice to meet you. I want you to keep taking your blood pressure (124/62) and diabetes (HgbA1C 6.1) medication. I think you have been doing a great job with that. I want you to keep up wit the diet and I think it is showing with your weight loss and your management of diabetes.    We are going to increase your Fluoxetine to see if this helps with your panic attacks. If you think you are going to lose consciousness please try to sit down. It might help to keep a journal of when these occur so you may try to pinpoint a trigger.   You can try to different relaxation techniques in order to alleviate these episodes such as deep breathing as you have been doing and resting.   Thank you and nice to meet your!

## 2012-10-18 NOTE — Progress Notes (Signed)
Patient ID: [name] [name], female   DOB: [birth year], 62 y.o.   MRN: [id number] [facility] [name] Gandy, MD Phone: [phone]  Subjective:   Panic Attacks  These started 2 weeks ago. She usually has about 1 to 2 episodes per day. They last roughly one minute. She has a sense of an overwhelming sensation coming down on her. She has palpitation, sweat profusely, her hands shake and has an unsteady gait. She uses deep breathing to help alleviate these symptoms and it seems to work. She rarely drink caffeine and has no fear of going into open spaces. The last time if occurred was two days ago and none yesterday. It first occurred when she was going to her mother's house. Was not depressed per the depression screening.  She hasn't any recent changes in her life. No new job but her sister is in a nursing home. She visits with her sister often.   ROS--See HPI   Reviewed problem list.  Medications- reviewed and updated   Objective: BP 124/62  Pulse 78  Ht 5\' 2"  (1.575 m)  Wt 84.369 kg (186 lb)  BMI 34.01 kg/m2 Gen: NAD, resting comfortably CV: RRR no murmurs rubs or gallops Lungs: CTAB no crackles, wheeze, rhonchi Skin: warm, dry MSK: grossly normal, moves all extremities, 5/5 strength in upper and lower extremities. Neuro: Alert   Assessment/Plan:  Panic attacks: Come and go  - Flexeril increased 20 mg --> 40 mg  - Practice deep breathing and relaxation techniques.  - Keep a journal of when these attacks occur so we can pinpoint a trigger  - F/u in 1-2 months, if still occuring then consider a cardiac monitor for any EKG changes  - consider TSH for any thyroid issues.  - consider if she is willing to speak/meet with a therapist.    Told to quit using Tramadol bc of interaction with Amitriptyline. She takes 1/2 a tablet of Amitriptyline at night to help sleep.

## 2012-10-18 NOTE — Assessment & Plan Note (Signed)
BP 124/62 today. Keep the current medication regimine. She is compliant and has no side effects.

## 2012-11-23 ENCOUNTER — Telehealth: Payer: Self-pay | Admitting: Family Medicine

## 2012-11-23 DIAGNOSIS — F410 Panic disorder [episodic paroxysmal anxiety] without agoraphobia: Secondary | ICD-10-CM

## 2012-11-23 NOTE — Telephone Encounter (Signed)
Pt went to get her refill yesterday of fluoxetine. She was given 30 pills of 20 mg and told to take 2 per day. This only last for 15 days.  Is she to take 20mg  twice a day (should have received 60 pills) or 30 40 mg pills? Please advise

## 2012-11-24 NOTE — Telephone Encounter (Signed)
I recently changed Ms. Trautman's Fluoxetine from 20 mg to 40 mg. The script is written for 40 mg BID, this is incorrect. The script should be wrote for 40 mg daily. Please inform the patient of the proper dosing. Thank you.

## 2012-11-25 MED ORDER — FLUOXETINE HCL 40 MG PO CAPS: 40.0000 mg | ORAL_CAPSULE | Freq: Every day | ORAL | Status: DC

## 2012-11-25 NOTE — Telephone Encounter (Signed)
Sent in rx for patient for the 40mg  pills

## 2012-12-01 ENCOUNTER — Other Ambulatory Visit: Payer: Self-pay

## 2012-12-01 DIAGNOSIS — Z1231 Encounter for screening mammogram for malignant neoplasm of breast: Secondary | ICD-10-CM

## 2012-12-02 ENCOUNTER — Ambulatory Visit
Admission: RE | Admit: 2012-12-02 | Discharge: 2012-12-02 | Disposition: A | Discharge status: Home / Self Care | Payer: 59 | Source: Ambulatory Visit

## 2012-12-02 DIAGNOSIS — Z1231 Encounter for screening mammogram for malignant neoplasm of breast: Secondary | ICD-10-CM

## 2012-12-19 ENCOUNTER — Other Ambulatory Visit: Payer: Self-pay | Admitting: Family Medicine

## 2012-12-26 ENCOUNTER — Ambulatory Visit: Payer: 59

## 2013-01-18 ENCOUNTER — Other Ambulatory Visit: Payer: Self-pay | Admitting: Family Medicine

## 2013-01-18 DIAGNOSIS — I10 Essential (primary) hypertension: Secondary | ICD-10-CM

## 2013-01-18 DIAGNOSIS — J309 Allergic rhinitis, unspecified: Secondary | ICD-10-CM

## 2013-01-18 DIAGNOSIS — F329 Major depressive disorder, single episode, unspecified: Secondary | ICD-10-CM

## 2013-01-18 MED ORDER — HYDROCHLOROTHIAZIDE 25 MG PO TABS: 25.0000 mg | ORAL_TABLET | Freq: Every day | ORAL | Status: DC

## 2013-01-18 MED ORDER — FLUTICASONE PROPIONATE 50 MCG/ACT NA SUSP: 2.0000 | Freq: Every day | NASAL | Status: DC

## 2013-01-18 MED ORDER — FLUOXETINE HCL 40 MG PO CAPS: 40.0000 mg | ORAL_CAPSULE | Freq: Every day | ORAL | Status: DC

## 2013-05-09 ENCOUNTER — Encounter: Payer: Self-pay | Admitting: Family Medicine

## 2013-05-09 ENCOUNTER — Ambulatory Visit (HOSPITAL_COMMUNITY)
Admission: RE | Admit: 2013-05-09 | Discharge: 2013-05-09 | Disposition: A | Discharge status: Home / Self Care | Payer: 59 | Source: Ambulatory Visit | Attending: Family Medicine | Admitting: Family Medicine

## 2013-05-09 ENCOUNTER — Ambulatory Visit (INDEPENDENT_AMBULATORY_CARE_PROVIDER_SITE_OTHER): Payer: 59 | Admitting: Family Medicine

## 2013-05-09 VITALS — BP 146/78 | HR 67 | Temp 98.9°F | Wt 170.0 lb

## 2013-05-09 DIAGNOSIS — R079 Chest pain, unspecified: Secondary | ICD-10-CM

## 2013-05-09 LAB — BASIC METABOLIC PANEL
BUN: 14 mg/dL (ref 6–23)
CO2: 28 mEq/L (ref 19–32)
Calcium: 9.3 mg/dL (ref 8.4–10.5)
Chloride: 103 mEq/L (ref 96–112)
Creat: 0.84 mg/dL (ref 0.50–1.10)
Glucose, Bld: 108 mg/dL — ABNORMAL HIGH (ref 70–99)
POTASSIUM: 3.8 meq/L (ref 3.5–5.3)
Sodium: 140 mEq/L (ref 135–145)

## 2013-05-09 NOTE — Progress Notes (Signed)
Subjective:     Patient ID: [name] [name], female   DOB: 01-07-1951, 63 y.o.   MRN: [id number]  HPI [name] [name] is here in Same day appt for chest pain.  Patient with a PMH of HTN, DM2, mitral valve prolapse, anxiety and depression. No history of cardiac surgeries.   She describes chest pain that radiates to her shoulder blade and sharp in nature. She had one episode on Saturday and one episode on Sunday. She hasn't had an episode toady.  The pain was not related to eating.  She was doing house work and the pain presented.  The pain resolved by itself.  She had some nausea but denies any vomiting. She took some milk of magnesia on Sunday and that seemed to help. She is not currently take any medications for reflux. She describes as being more tired recently. 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 She states her husband is getting frustrated with the house work pilling up.    TIMI score: 1  Daily ASA 81 mg  Risk factors for CAD: DM, HTN, former smoker    Current Outpatient Prescriptions on File Prior to Visit  Medication Sig Dispense Refill  . aspirin 81 MG EC tablet Take 81 mg by mouth daily. To help protect your heart       . cyclobenzaprine (FLEXERIL) 10 MG tablet TAKE 1 TABLET BY MOUTH AT BEDTIME FOR BACK PAIN AND AS SLEEP AID  30 tablet  1  . FLUoxetine (PROZAC) 40 MG capsule Take 1 capsule (40 mg total) by mouth daily.  90 capsule  1  . fluticasone (FLONASE) 50 MCG/ACT nasal spray Place 2 sprays into the nose daily. spray 1-2 sprays into both nostrils once a day for allergies  16 g  3  . hydrochlorothiazide (HYDRODIURIL) 25 MG tablet Take 1 tablet (25 mg total) by mouth daily.  90 tablet  3  . amitriptyline (ELAVIL) 150 MG tablet TAKE 1 TABLET AT BEDTIME  90 tablet  0  . diclofenac (VOLTAREN) 75 MG EC tablet Take 1 tablet (75 mg total) by mouth 2 (two) times daily as needed.  60 tablet  2  . ferrous sulfate 325 (65 FE) MG tablet  Take 325 mg by mouth 2 (two) times daily. Take 1 tab 2x a day for anemia/low blood count       . metFORMIN (GLUCOPHAGE XR) 500 MG 24 hr tablet Take 1 tablet (500 mg total) by mouth daily with breakfast.  30 tablet  5  . metroNIDAZOLE (FLAGYL) 500 MG tablet Take 1 tablet (500 mg total) by mouth 2 (two) times daily.  14 tablet  0  . mupirocin cream (BACTROBAN) 2 % Apply topically 3 (three) times daily.  15 g  0  . OLANZapine (ZYPREXA) 5 MG tablet TAKE 1 TABLET (5 MG TOTAL) BY MOUTH AT BEDTIME.  30 tablet  1  . omeprazole (PRILOSEC) 20 MG capsule Take 20 mg by mouth daily. For reflux/heartburn       . ondansetron (ZOFRAN) 4 MG tablet TAKE 1 TABLET (4 MG TOTAL) BY MOUTH EVERY 8 (EIGHT) HOURS AS NEEDED FOR NAUSEA.  20 tablet  0  . traMADol (ULTRAM) 50 MG tablet Take 1 tablet (50 mg total) by mouth every 8 (eight) hours as needed for pain.  30 tablet  0   No current facility-administered medications on file prior to visit.    I have reviewed and updated the  following as appropriate: allergies, current medications, past family history, past medical history, past social history, past surgical history and problem list  Review of Systems All other systems reviewed and otherwise normal.      Objective:   Physical Exam BP 146/78  Pulse 67  Temp(Src) 98.9 F (37.2 C) (Oral)  Wt 170 lb (77.111 kg) Gen: NAD, alert, cooperative with exam, well-appearing, African [place] female.  CV: RRR, good S1/S2, no murmur, no edema, capillary refill brisk  Resp: CTABL, no wheezes, non-labored  Psych: good insight, alert and oriented     Assessment:         Plan:

## 2013-05-09 NOTE — Assessment & Plan Note (Signed)
TIMI score of 1. EKG NSR. BMP with normal electrolytes. Unlikely to be associated with her heart. Possibility to be associated with MVP. She is not currently taking her metformin as it was causing her to have odd discomfort (warm sensation throughout her body).   - Troponin: pending.  - follow up in 1 week if symptoms persists.  - could have Cardiology referral

## 2013-05-09 NOTE — Patient Instructions (Addendum)
Thank you for coming in,   It is not likely to be your heart. We will get labs to make sure. I will call you with the results.   It sounds like you are having a lot to handle right now with school and housework. Try to make time for yourself and relax.   If you are having these symptoms and they don't go away, please seek immediate help.    Please feel free to call with any questions or concerns at any time, at [phone]. --Dr. Raeford Razor Chest Pain (Nonspecific) It is often hard to give a specific diagnosis for the cause of chest pain. There is always a chance that your pain could be related to something serious, such as a heart attack or a blood clot in the lungs. You need to follow up with your caregiver for further evaluation. CAUSES   Heartburn.  Pneumonia or bronchitis.  Anxiety or stress.  Inflammation around your heart (pericarditis) or lung (pleuritis or pleurisy).  A blood clot in the lung.  A collapsed lung (pneumothorax). It can develop suddenly on its own (spontaneous pneumothorax) or from injury (trauma) to the chest.  Shingles infection (herpes zoster virus). The chest wall is composed of bones, muscles, and cartilage. Any of these can be the source of the pain.  The bones can be bruised by injury.  The muscles or cartilage can be strained by coughing or overwork.  The cartilage can be affected by inflammation and become sore (costochondritis). DIAGNOSIS  Lab tests or other studies, such as X-rays, electrocardiography, stress testing, or cardiac imaging, may be needed to find the cause of your pain.  TREATMENT   Treatment depends on what may be causing your chest pain. Treatment may include:  Acid blockers for heartburn.  Anti-inflammatory medicine.  Pain medicine for inflammatory conditions.  Antibiotics if an infection is present.  You may be advised to change lifestyle habits. This includes stopping smoking and avoiding alcohol, caffeine, and  chocolate.  You may be advised to keep your head raised (elevated) when sleeping. This reduces the chance of acid going backward from your stomach into your esophagus.  Most of the time, nonspecific chest pain will improve within 2 to 3 days with rest and mild pain medicine. HOME CARE INSTRUCTIONS   If antibiotics were prescribed, take your antibiotics as directed. Finish them even if you start to feel better.  For the next few days, avoid physical activities that bring on chest pain. Continue physical activities as directed.  Do not smoke.  Avoid drinking alcohol.  Only take over-the-counter or prescription medicine for pain, discomfort, or fever as directed by your caregiver.  Follow your caregiver's suggestions for further testing if your chest pain does not go away.  Keep any follow-up appointments you made. If you do not go to an appointment, you could develop lasting (chronic) problems with pain. If there is any problem keeping an appointment, you must call to reschedule. SEEK MEDICAL CARE IF:   You think you are having problems from the medicine you are taking. Read your medicine instructions carefully.  Your chest pain does not go away, even after treatment.  You develop a rash with blisters on your chest. SEEK IMMEDIATE MEDICAL CARE IF:   You have increased chest pain or pain that spreads to your arm, neck, jaw, back, or abdomen.  You develop shortness of breath, an increasing cough, or you are coughing up blood.  You have severe back or abdominal pain, feel nauseous,  or vomit.  You develop severe weakness, fainting, or chills.  You have a fever. THIS IS AN EMERGENCY. Do not wait to see if the pain will go away. Get medical help at once. Call your local emergency services (911 in [place] Do not drive yourself to the hospital. MAKE SURE YOU:   Understand these instructions.  Will watch your condition.  Will get help right away if you are not doing well or get  worse. Document Released: 12/24/2004 Document Revised: 06/08/2011 Document Reviewed: 10/20/2007 [facility] Patient Information 2014 [place].

## 2013-05-10 ENCOUNTER — Telehealth: Payer: Self-pay | Admitting: *Deleted

## 2013-05-10 LAB — TROPONIN I: TROPONIN I: 0.01 ng/mL (ref <0.06)

## 2013-05-10 NOTE — Telephone Encounter (Signed)
Message copied by Johny Shears on Wed May 10, 2013 11:45 AM ------      Message from: Clearance Coots E      Created: Wed May 10, 2013 11:08 AM       Please call patient and inform her that her labs were normal. Thank you. ------

## 2013-05-10 NOTE — Telephone Encounter (Signed)
LVM for patient to call back. ?

## 2013-05-22 ENCOUNTER — Ambulatory Visit: Payer: 59 | Admitting: Family Medicine

## 2013-05-23 ENCOUNTER — Ambulatory Visit: Payer: 59 | Admitting: Family Medicine

## 2013-06-01 ENCOUNTER — Ambulatory Visit (INDEPENDENT_AMBULATORY_CARE_PROVIDER_SITE_OTHER): Payer: 59 | Admitting: Family Medicine

## 2013-06-01 ENCOUNTER — Encounter: Payer: Self-pay | Admitting: Family Medicine

## 2013-06-01 VITALS — BP 138/72 | HR 70 | Temp 98.1°F | Ht 62.0 in | Wt 172.5 lb

## 2013-06-01 DIAGNOSIS — E119 Type 2 diabetes mellitus without complications: Secondary | ICD-10-CM

## 2013-06-01 DIAGNOSIS — B352 Tinea manuum: Secondary | ICD-10-CM

## 2013-06-01 DIAGNOSIS — I10 Essential (primary) hypertension: Secondary | ICD-10-CM

## 2013-06-01 LAB — POCT GLYCOSYLATED HEMOGLOBIN (HGB A1C): HEMOGLOBIN A1C: 6

## 2013-06-01 MED ORDER — TERBINAFINE HCL 1 % EX CREA: 1.0000 "application " | TOPICAL_CREAM | Freq: Two times a day (BID) | CUTANEOUS | Status: DC

## 2013-06-01 NOTE — Assessment & Plan Note (Signed)
Currently only on right thumb and right first finger on palmer side. She has used Vaseline and other remedies with no improvement.  - Lamisil BID for one month - f/u after if still present or sooner if worse.  - discussed with Dr. Erin Hearing.

## 2013-06-01 NOTE — Assessment & Plan Note (Signed)
Well controlled. Patient currently taking HCTZ. BP was initially high during exam but re-check showed to be 138/72.  - no change  - if elevated in future, then consider adding an ACEi for renal protection.  - Discussed with Dr. Erin Hearing.

## 2013-06-01 NOTE — Progress Notes (Signed)
Subjective:     Patient ID: [name] [name], female   DOB: 21-Jul-1950, 63 y.o.   MRN: [id number]  HPI [name] [name] is here for HTN, DM and .  HTN Disease Monitoring:Checks it home with her home kit.  Home BP Monitoring yes  Chest pain- one time, history of reflux. Non exertional. Radiation to shoulder blade. Once her bowels started working again the pain went away.    Dyspnea- no Medications:HCTZ Compliance-  Yes . Lightheadedness-  No Edema- no     CHRONIC DIABETES  Disease Monitoring  Blood Sugar Ranges: usually runs to 120-130's   Polyuria: no, drinks a lot of water  Visual problems: no   Medication Compliance: no, quit taking metformin about a week ago. Would feel paresthesia on her face.   Medication Side Effects  Hypoglycemia: no   Decreased appetite. Hasn't eaten anything today. Some days she eats a lot while others she may have one meal. Feels like she does have some reflux. She has felt like her pills get stuck in her throat. Has taken omeprazole and took it helped her symptoms.   Preventitive Health Care  Eye Exam: has prescription glasses and has not been examined in two years   Foot Exam: evaluated today   Diet pattern: Eats about one meal and seldom has a snack.   Exercise: Videos at home that she performs. P90X.    Dry skin on her thumb and pointer. Has tried vaseline and eucerin. Get sore when they peel. Doesn't extend anywhere else on the hand. The cold may affect it, keeping it dry. The ointment helped it but never got rid of it. Nothing else seems to make it worse.   Current Outpatient Prescriptions on File Prior to Visit  Medication Sig Dispense Refill  . amitriptyline (ELAVIL) 150 MG tablet TAKE 1 TABLET AT BEDTIME  90 tablet  0  . aspirin 81 MG EC tablet Take 81 mg by mouth daily. To help protect your heart       . cyclobenzaprine (FLEXERIL) 10 MG tablet TAKE 1 TABLET BY MOUTH AT BEDTIME FOR BACK PAIN AND AS SLEEP AID  30 tablet  1  .  diclofenac (VOLTAREN) 75 MG EC tablet Take 1 tablet (75 mg total) by mouth 2 (two) times daily as needed.  60 tablet  2  . ferrous sulfate 325 (65 FE) MG tablet Take 325 mg by mouth 2 (two) times daily. Take 1 tab 2x a day for anemia/low blood count       . FLUoxetine (PROZAC) 40 MG capsule Take 1 capsule (40 mg total) by mouth daily.  90 capsule  1  . fluticasone (FLONASE) 50 MCG/ACT nasal spray Place 2 sprays into the nose daily. spray 1-2 sprays into both nostrils once a day for allergies  16 g  3  . hydrochlorothiazide (HYDRODIURIL) 25 MG tablet Take 1 tablet (25 mg total) by mouth daily.  90 tablet  3  . metFORMIN (GLUCOPHAGE XR) 500 MG 24 hr tablet Take 1 tablet (500 mg total) by mouth daily with breakfast.  30 tablet  5  . metroNIDAZOLE (FLAGYL) 500 MG tablet Take 1 tablet (500 mg total) by mouth 2 (two) times daily.  14 tablet  0  . mupirocin cream (BACTROBAN) 2 % Apply topically 3 (three) times daily.  15 g  0  . OLANZapine (ZYPREXA) 5 MG tablet TAKE 1 TABLET (5 MG TOTAL) BY MOUTH AT BEDTIME.  30 tablet  1  .  omeprazole (PRILOSEC) 20 MG capsule Take 20 mg by mouth daily. For reflux/heartburn       . ondansetron (ZOFRAN) 4 MG tablet TAKE 1 TABLET (4 MG TOTAL) BY MOUTH EVERY 8 (EIGHT) HOURS AS NEEDED FOR NAUSEA.  20 tablet  0  . traMADol (ULTRAM) 50 MG tablet Take 1 tablet (50 mg total) by mouth every 8 (eight) hours as needed for pain.  30 tablet  0   No current facility-administered medications on file prior to visit.    Review of Systems See HPI      Objective:   Physical Exam BP 161/73  Pulse 70  Temp(Src) 98.1 F (36.7 C) (Oral)  Ht 5\' 2"  (1.575 m)  Wt 172 lb 8 oz (78.245 kg)  BMI 31.54 kg/m2 Gen: NAD, alert, cooperative with exam, well-appearing, African American female  CV: RRR, good S1/S2, no murmur, no edema, capillary refill brisk, +2 BP and TP pulses in b/l feet.  Resp: CTABL, no wheezes, non-labored Skin: right hand thumb and 1st finger have dry skin on the palmer  aspect. No dry skin skin located on left hand or feet.  Neuro: sensation intact b/l feet.  Psych: good insight, alert and oriented     Assessment:         Plan:

## 2013-06-01 NOTE — Patient Instructions (Addendum)
Thank you for coming in,   I think your diabetes and blood pressure are well controlled.   I think trying different relaxation techniques will help you go to sleep.   I want you to try the lamisil for twice a day for a month.   Please follow up with me if you are having any problems.    Please feel free to call with any questions or concerns at any time, at [phone]. --Dr. Raeford Razor  Diet Recommendations for Diabetes   Starchy (carb) foods include: Bread, rice, pasta, potatoes, corn, crackers, bagels, muffins, all baked goods.   Protein foods include: Meat, fish, poultry, eggs, dairy foods, and beans such as pinto and kidney beans (beans also provide carbohydrate).   1. Eat at least 3 meals and 1-2 snacks per day. Never go more than 4-5 hours while awake without eating.  2. Limit starchy foods to TWO per meal and ONE per snack. ONE portion of a starchy  food is equal to the following:   - ONE slice of bread (or its equivalent, such as half of a hamburger bun).   - 1/2 cup of a "scoopable" starchy food such as potatoes or rice.   - 1 OUNCE (28 grams) of starchy snack foods such as crackers or pretzels (look on label).   - 15 grams of carbohydrate as shown on food label.  3. Both lunch and dinner should include a protein food, a carb food, and vegetables.   - Obtain twice as many veg's as protein or carbohydrate foods for both lunch and dinner.   - Try to keep frozen veg's on hand for a quick vegetable serving.     - Fresh or frozen veg's are best.  4. Breakfast should always include protein.

## 2013-06-01 NOTE — Assessment & Plan Note (Signed)
Well controlled. She quit taking metformin roughly one month ago due to intolerance. Seems to be controlled with diet and exercise  - Hgb A1c 6.0 - will hold off initiating metformin again. Will check A1c in three months to determine if needs to be restarted  - She was having dysphagia which could be related to her diabetes. History of reflux as well, may need to restart a PPI if still present.  - Discussed with Dr. Erin Hearing.

## 2013-09-08 ENCOUNTER — Encounter: Payer: Self-pay | Admitting: Family Medicine

## 2013-09-08 ENCOUNTER — Ambulatory Visit (INDEPENDENT_AMBULATORY_CARE_PROVIDER_SITE_OTHER): Payer: 59 | Admitting: Family Medicine

## 2013-09-08 VITALS — BP 118/69 | HR 66 | Temp 97.3°F | Resp 18 | Wt 176.0 lb

## 2013-09-08 DIAGNOSIS — L659 Nonscarring hair loss, unspecified: Secondary | ICD-10-CM

## 2013-09-08 DIAGNOSIS — F411 Generalized anxiety disorder: Secondary | ICD-10-CM

## 2013-09-08 DIAGNOSIS — I10 Essential (primary) hypertension: Secondary | ICD-10-CM

## 2013-09-08 DIAGNOSIS — L658 Other specified nonscarring hair loss: Secondary | ICD-10-CM

## 2013-09-08 MED ORDER — MINOXIDIL 5 % EX FOAM: CUTANEOUS | Status: DC

## 2013-09-08 NOTE — Progress Notes (Signed)
Subjective:    Patient ID: [name] [name], female    DOB: 10-09-1950, 63 y.o.   MRN: [id number]  HPI [name] [name] is here for hair loss, HTN f/u and depression f/u.   She started noticing hair loss 3 months ago. The her losses and the top of her scalp and centralized. Her mother has hair loss but is also 49 years old. Her sister has no hair loss. She denies any different shampoo or infection. She colored her hair about a month and a half ago and received a treatment about 3 months ago. She read that hydrochlorothiazide can cause hair loss to stop taking it May 20th.  HTN Disease Monitoring:none Home BP Monitoring none Chest pain- no    Dyspnea- no Medications: HCTZ  Compliance-  no. Lightheadedness-  no  Edema- no Stopped taking hydrochlorothiazide on May 20th as described above.   She ran out of fluoxetine for about a week and a half ago. At that time the medication was not helping. She did not want to be dependent on the medication as to why she stopped it. She was taking it for agitation and anxiety. She has had an increase in her interests which are riding motorcycles. And she seems to be able to go to sleep without the help of the medication.    Current Outpatient Prescriptions on File Prior to Visit  Medication Sig Dispense Refill  . aspirin 81 MG EC tablet Take 81 mg by mouth daily. To help protect your heart       . metFORMIN (GLUCOPHAGE XR) 500 MG 24 hr tablet Take 1 tablet (500 mg total) by mouth daily with breakfast.  30 tablet  5  . cyclobenzaprine (FLEXERIL) 10 MG tablet TAKE 1 TABLET BY MOUTH AT BEDTIME FOR BACK PAIN AND AS SLEEP AID  30 tablet  1  . FLUoxetine (PROZAC) 40 MG capsule Take 1 capsule (40 mg total) by mouth daily.  90 capsule  1  . fluticasone (FLONASE) 50 MCG/ACT nasal spray Place 2 sprays into the nose daily. spray 1-2 sprays into both nostrils once a day for allergies  16 g  3  . hydrochlorothiazide (HYDRODIURIL) 25 MG tablet Take 1 tablet (25 mg  total) by mouth daily.  90 tablet  3  . terbinafine (LAMISIL AT) 1 % cream Apply 1 application topically 2 (two) times daily.  30 g  0   No current facility-administered medications on file prior to visit.    Review of Systems See HPI    Objective:   Physical Exam BP 118/69  Pulse 66  Temp(Src) 97.3 F (36.3 C) (Oral)  Resp 18  Wt 176 lb (79.833 kg)  SpO2 99% Gen: NAD, alert, cooperative with exam, well-appearing, African American female HEENT: NCAT, central aspect hair loss from frontal to posterior on top of scalp, no tinea, no scarring      Assessment & Plan:

## 2013-09-08 NOTE — Assessment & Plan Note (Signed)
Patient has stopped taking fluoxetine as she feels like it's not helping and doesn't want to be reliant on a medication. She feels much better and has gained interest in old activities. She is able to fall sleep without the help of medication. - Will continue to monitor - Given the option that if she would like to try another medication in the future then it is there

## 2013-09-08 NOTE — Assessment & Plan Note (Signed)
Controlled. Stop taking hydrochlorothiazide due to suggestive hair loss - Will continue to monitor without medication

## 2013-09-08 NOTE — Patient Instructions (Signed)
Thank you for coming in,   I have sent in the topical foam minoxidil. Apply 1/2 capful to your scalp daily. Lets try this as long as you tolerate it. If you feel that it isn't working then I can send a dermatology referral.   We will continue to monitor your blood pressure without the medication. Let me know if you want to start another medication since stopping the fluoxetine.    Please feel free to call with any questions or concerns at any time, at [phone]. --Dr. Raeford Razor

## 2013-09-08 NOTE — Assessment & Plan Note (Signed)
She is postmenopausal, no signs of hirsutism, and no sign of a tinea infection. We'll have a trial period of minoxidil 5% foam  - if she tolerates and sees results then will continue  - If she doesn't tolerate and doesn't see any signs then refer to dermatology - Discussed with Dr. Ree Kida

## 2013-09-18 ENCOUNTER — Telehealth: Payer: Self-pay | Admitting: Family Medicine

## 2013-09-18 DIAGNOSIS — F419 Anxiety disorder, unspecified: Secondary | ICD-10-CM

## 2013-09-18 NOTE — Telephone Encounter (Signed)
Patient was taken off FLUoxetine a few weeks ago. She states that this past weekend she stayed agitated and snappy. She states that she will need to restart medicine or something similar. Please advise.

## 2013-09-20 MED ORDER — SERTRALINE HCL 25 MG PO TABS: 25.0000 mg | ORAL_TABLET | Freq: Every day | ORAL | Status: DC

## 2013-09-20 NOTE — Telephone Encounter (Signed)
Will start sertraline.  Reports having increasing anxiety/agitation. We have discussed about the possibility of starting something if she feels like she cannot control her anxiety.  Patient has taken fluoxetine in the past but hasn't taken any since a week prior 6/12. Patient has a history of anxiety and agitation.   Rosemarie Ax, MD PGY-1, [facility] 09/20/2013, 2:04 PM

## 2013-09-20 NOTE — Telephone Encounter (Signed)
Relayed message,patient voiced understanding. Proposito, Giovanna S  

## 2013-10-13 ENCOUNTER — Telehealth: Payer: Self-pay | Admitting: Family Medicine

## 2013-10-13 NOTE — Telephone Encounter (Signed)
[name] [name] need to get something for her bp.  Was having dizziness and took one of her HCTZ tabs.  At the time her bp reading was 151/83.  After taking med it went down to 144/86. Will come in Monday for BP check.  Please call her after seeing vitals to inform what's next step

## 2013-10-16 ENCOUNTER — Ambulatory Visit (INDEPENDENT_AMBULATORY_CARE_PROVIDER_SITE_OTHER): Payer: 59 | Admitting: *Deleted

## 2013-10-16 VITALS — BP 140/82

## 2013-10-16 DIAGNOSIS — I10 Essential (primary) hypertension: Secondary | ICD-10-CM

## 2013-10-16 NOTE — Progress Notes (Signed)
   Pt in nurse clinic for blood pressure check.  Blood pressure 140/82 manually.  Pt home blood pressure readings 7/19/2015L 134/72 at 9 PM; 130/87 4:30 PM and 155/90 4:30 PM 10/14/2013.  Pt denies any concerns today.  Pt stated back with HCTZ 25 mg once daily Thursday of last week (10/12/2013).  Will forward PCP.  Derl Barrow, RN

## 2013-10-18 NOTE — Telephone Encounter (Signed)
Most recent blood pressure in clinic was within normal limits. No need to add another agent. Dizziness is more suggestive of lower blood pressure. If still symptomatic then needs to be evaluated in clinic.   Rosemarie Ax, MD PGY-2, [facility] 10/18/2013, 12:25 PM

## 2013-11-07 ENCOUNTER — Telehealth: Payer: Self-pay | Admitting: Family Medicine

## 2013-11-07 DIAGNOSIS — F419 Anxiety disorder, unspecified: Secondary | ICD-10-CM

## 2013-11-07 NOTE — Telephone Encounter (Signed)
Pt is out of Zoloft.  She is scheduled for an appt on Monday with Raeford Razor.  She did not feel the dosage was effective.   She doesn't know whether to get it refilled or just wait until Monday  Please advise

## 2013-11-08 MED ORDER — SERTRALINE HCL 50 MG PO TABS: 50.0000 mg | ORAL_TABLET | Freq: Every day | ORAL | Status: DC

## 2013-11-08 NOTE — Telephone Encounter (Signed)
Poke with patient and informed her of below

## 2013-11-08 NOTE — Telephone Encounter (Signed)
Will increase Zoloft to 50 mg daily with f/u on Monday.   Rosemarie Ax, MD PGY-2, [facility] 11/08/2013, 8:47 AM

## 2013-11-13 ENCOUNTER — Ambulatory Visit (INDEPENDENT_AMBULATORY_CARE_PROVIDER_SITE_OTHER): Payer: 59 | Admitting: Family Medicine

## 2013-11-13 ENCOUNTER — Encounter: Payer: Self-pay | Admitting: Family Medicine

## 2013-11-13 VITALS — BP 127/76 | HR 67 | Ht 62.0 in | Wt 180.0 lb

## 2013-11-13 DIAGNOSIS — I10 Essential (primary) hypertension: Secondary | ICD-10-CM

## 2013-11-13 DIAGNOSIS — F411 Generalized anxiety disorder: Secondary | ICD-10-CM

## 2013-11-13 DIAGNOSIS — E559 Vitamin D deficiency, unspecified: Secondary | ICD-10-CM

## 2013-11-13 DIAGNOSIS — R252 Cramp and spasm: Secondary | ICD-10-CM

## 2013-11-13 MED ORDER — CYCLOBENZAPRINE HCL 10 MG PO TABS: ORAL_TABLET | ORAL | Status: DC

## 2013-11-13 NOTE — Progress Notes (Signed)
Subjective:    Patient ID: [name] [name], female    DOB: 01-Nov-1950, 63 y.o.   MRN: [id number]  HPI  [name] [name] is here for f/u HTN, depression and new cramps.  Depression. She was feeling worse last week and ran out of her medication. She has tried the increase in her medication and has felt better. She felt like she wasx snppy but she feel muct beter. She has talked to her daughrter. She felt like everyone was coming at her.   HTN: BP has been controlled prior to this but she started taking her HCTZ  Disease Monitoring:150/90 Home BP Monitoring yes Chest pain- no    Dyspnea- no Medications:HCTZ  Compliance-  Yes . Lightheadedness-  no  Edema- no   Leg cramps have been going for three weeks. She was sitting still at work and the cramps started. The cramps are in her toes and lower legs. The cramps went away on there own. Drinks a lot of water in a day. Doesn't drink a lot of sweet tea. She was a smoker and quit 7 years ago. She smoked for roughly 20 years.   Current Outpatient Prescriptions on File Prior to Visit  Medication Sig Dispense Refill  . aspirin 81 MG EC tablet Take 81 mg by mouth daily. To help protect your heart       . cyclobenzaprine (FLEXERIL) 10 MG tablet TAKE 1 TABLET BY MOUTH AT BEDTIME FOR BACK PAIN AND AS SLEEP AID  30 tablet  1  . fluticasone (FLONASE) 50 MCG/ACT nasal spray Place 2 sprays into the nose daily. spray 1-2 sprays into both nostrils once a day for allergies  16 g  3  . hydrochlorothiazide (HYDRODIURIL) 25 MG tablet Take 1 tablet (25 mg total) by mouth daily.  90 tablet  3  . metFORMIN (GLUCOPHAGE XR) 500 MG 24 hr tablet Take 1 tablet (500 mg total) by mouth daily with breakfast.  30 tablet  5  . Minoxidil 5 % FOAM 5% foam solution. Apply 1/2 capful to scalp daily.  60 g  0  . sertraline (ZOLOFT) 50 MG tablet Take 1 tablet (50 mg total) by mouth daily.  30 tablet  3  . terbinafine (LAMISIL AT) 1 % cream Apply 1 application topically 2 (two)  times daily.  30 g  0   No current facility-administered medications on file prior to visit.     Review of Systems See HPI     Objective:   Physical Exam BP 127/76  Pulse 67  Ht 5\' 2"  (1.575 m)  Wt 180 lb (81.647 kg)  BMI 32.91 kg/m2 Gen: NAD, alert, cooperative with exam, well-appearing CV: RRR, good S1/S2, no murmur, no edema, capillary refill brisk  Resp: CTABL, no wheezes, non-labored MSK: +2 DTR's b/l, 5/5 strength in LE b/l, sensation intact b/l in LE.   >greater than 50% of the time was spent counseling.      Assessment & Plan:

## 2013-11-13 NOTE — Patient Instructions (Signed)
Thank you for coming in,   Follow up with me in two weeks.    Please feel free to call with any questions or concerns at any time, at [phone]. --Dr. Raeford Razor

## 2013-11-14 LAB — BASIC METABOLIC PANEL
BUN: 19 mg/dL (ref 6–23)
CALCIUM: 9.6 mg/dL (ref 8.4–10.5)
CO2: 24 meq/L (ref 19–32)
CREATININE: 0.9 mg/dL (ref 0.50–1.10)
Chloride: 103 mEq/L (ref 96–112)
GLUCOSE: 131 mg/dL — AB (ref 70–99)
Potassium: 3.7 mEq/L (ref 3.5–5.3)
Sodium: 140 mEq/L (ref 135–145)

## 2013-11-14 LAB — VITAMIN D 25 HYDROXY (VIT D DEFICIENCY, FRACTURES): Vit D, 25-Hydroxy: 15 ng/mL — ABNORMAL LOW (ref 30–89)

## 2013-11-14 MED ORDER — VITAMIN D (ERGOCALCIFEROL) 1.25 MG (50000 UNIT) PO CAPS: 50000.0000 [IU] | ORAL_CAPSULE | ORAL | Status: DC

## 2013-11-14 NOTE — Assessment & Plan Note (Signed)
Controlled. Patient started the medication on her own after home monitoring and elevated - continue current regimen

## 2013-11-15 DIAGNOSIS — E559 Vitamin D deficiency, unspecified: Secondary | ICD-10-CM | POA: Insufficient documentation

## 2013-11-15 DIAGNOSIS — R252 Cramp and spasm: Secondary | ICD-10-CM | POA: Insufficient documentation

## 2013-11-15 NOTE — Assessment & Plan Note (Signed)
Patient reports that when she was off her medication she was feeling frustrated with her family members and not acting herself. Medication ws increased and she feels much better and in good spirits.  - continue current regimen. zoloft 50 mg daily

## 2013-11-15 NOTE — Assessment & Plan Note (Signed)
Found to be deficient with symptoms of leg cramps.  - 50,000 U once weekly for 8 weeks.  - re-check vit d level  - 2000 U daily after that.

## 2013-11-15 NOTE — Assessment & Plan Note (Addendum)
Unsure of what could be causing cramps.  Could be related to poor hydration. Possible for infrequent exercise and deconditioning. Possible for claudication. Could be associated with Vit d deficiency  - BMP: around baseline  - vit d deficient, will supplement

## 2013-12-19 ENCOUNTER — Ambulatory Visit: Payer: 59 | Admitting: Family Medicine

## 2014-01-17 ENCOUNTER — Ambulatory Visit (INDEPENDENT_AMBULATORY_CARE_PROVIDER_SITE_OTHER): Payer: 59 | Admitting: Family Medicine

## 2014-01-17 ENCOUNTER — Encounter: Payer: Self-pay | Admitting: Family Medicine

## 2014-01-17 VITALS — BP 159/82 | HR 69 | Ht 62.0 in | Wt 180.0 lb

## 2014-01-17 DIAGNOSIS — M25511 Pain in right shoulder: Secondary | ICD-10-CM

## 2014-01-17 MED ORDER — METHYLPREDNISOLONE ACETATE 40 MG/ML IJ SUSP
40.0000 mg | Freq: Once | INTRAMUSCULAR | Status: AC
  Administered 2014-01-17: 40 mg via INTRA_ARTICULAR

## 2014-01-17 MED ORDER — HYDROCODONE-ACETAMINOPHEN 5-325 MG PO TABS: 1.0000 | ORAL_TABLET | Freq: Four times a day (QID) | ORAL | Status: DC | PRN

## 2014-01-17 NOTE — Patient Instructions (Signed)
You have rotator cuff impingement, possible frozen shoulder. Try to avoid painful activities (overhead activities, lifting with extended arm) as much as possible. Aleve 2 tabs twice a day with food OR ibuprofen 3 tabs three times a day with food for pain and inflammation. Norco as needed for severe pain (no driving on this) - generally you take this when you get home from work and at bedtime. Cortisone injection may be beneficial to help with pain and to decrease inflammation - you were given this today. Do arm circles, arm swings, and table slides 3 sets of 10 once or twice a day. Call me in 1-2 weeks to let me know how you're doing.

## 2014-01-22 ENCOUNTER — Other Ambulatory Visit: Payer: Self-pay | Admitting: Family Medicine

## 2014-01-22 ENCOUNTER — Encounter: Payer: Self-pay | Admitting: Family Medicine

## 2014-01-22 DIAGNOSIS — M25511 Pain in right shoulder: Secondary | ICD-10-CM | POA: Insufficient documentation

## 2014-01-22 NOTE — Assessment & Plan Note (Signed)
2/2 rotator cuff impingement, possible frozen shoulder.  Start with nsaids, combination intraarticular and subacromial injection, norco as needed.  Codman exercises reviewed.  Call me in 1-2 weeks to give [place] an update on her status.  Consider adding physical therapy if improving, advanced imaging if not improving.  After informed written consent, patient was seated on exam table. Right shoulder was prepped with alcohol swab and utilizing posterior approach, patient's right shoulder was injected with 6:2 marcaine:depomedrol with half in the subacromial space and half in glenohumeral space.  Patient tolerated the procedure well without immediate complications.

## 2014-01-22 NOTE — Progress Notes (Signed)
Patient ID: [name] [name], female   DOB: [birth year], 63 y.o.   MRN: [id number]  PCP: Myra Rude, MD  Subjective:   HPI: Patient is a 63 y.o. female here for right shoulder pain.  Patient reports for past couple weeks has had worsening pain and decreased motion of right shoulder. Pain radiates down her right arm. Works as a [employer]. Not taking anything for pain. No pain at rest - worse with any motions of this shoulder. + night pain. Had problems with this shoulder a year and a half ago.  Past Medical History  Diagnosis Date  . Hypertension   . Depression     Current Outpatient Prescriptions on File Prior to Visit  Medication Sig Dispense Refill  . aspirin 81 MG EC tablet Take 81 mg by mouth daily. To help protect your heart       . cyclobenzaprine (FLEXERIL) 10 MG tablet TAKE 1 TABLET BY MOUTH AT BEDTIME FOR BACK PAIN AND AS SLEEP AID  30 tablet  1  . fluticasone (FLONASE) 50 MCG/ACT nasal spray Place 2 sprays into the nose daily. spray 1-2 sprays into both nostrils once a day for allergies  16 g  3  . hydrochlorothiazide (HYDRODIURIL) 25 MG tablet Take 1 tablet (25 mg total) by mouth daily.  90 tablet  3  . Minoxidil 5 % FOAM 5% foam solution. Apply 1/2 capful to scalp daily.  60 g  0  . sertraline (ZOLOFT) 50 MG tablet Take 1 tablet (50 mg total) by mouth daily.  30 tablet  3  . terbinafine (LAMISIL AT) 1 % cream Apply 1 application topically 2 (two) times daily.  30 g  0  . Vitamin D, Ergocalciferol, (DRISDOL) 50000 UNITS CAPS capsule Take 1 capsule (50,000 Units total) by mouth every 7 (seven) days.  8 capsule  0   No current facility-administered medications on file prior to visit.    History reviewed. No pertinent past surgical history.  No Known Allergies  History   Social History  . Marital Status: Married    Spouse Name: N/A    Number of Children: N/A  . Years of Education: N/A   Occupational History  . Not on file.   Social History  Main Topics  . Smoking status: Former Smoker    Quit date: 07/30/2004  . Smokeless tobacco: Never Used  . Alcohol Use: 0.0 oz/week  . Drug Use: No  . Sexual Activity: Yes    Partners: Male   Other Topics Concern  . Not on file   Social History Narrative  . No narrative on file    No family history on file.  BP 159/82  Pulse 69  Ht 5\' 2"  (1.575 m)  Wt 180 lb (81.647 kg)  BMI 32.91 kg/m2  Review of Systems: See HPI above.    Objective:  Physical Exam:  Gen: NAD  Right shoulder: No swelling, ecchymoses.  No gross deformity. Diffuse TTP. Ext rotation 40 degrees, abduction and flexion 90 degrees, limited by pain. Positive Hawkins, Neers. Negative Yergasons. Strength 4/5 with empty can, painful and 5/5 resisted internal/external rotation. NV intact distally.    Assessment & Plan:  1. Right shoulder pain - 2/2 rotator cuff impingement, possible frozen shoulder.  Start with nsaids, combination intraarticular and subacromial injection, norco as needed.  Codman exercises reviewed.  Call me in 1-2 weeks to give [place] an update on her status.  Consider adding physical therapy if improving, advanced imaging if  not improving.  After informed written consent, patient was seated on exam table. Right shoulder was prepped with alcohol swab and utilizing posterior approach, patient's right shoulder was injected with 6:2 marcaine:depomedrol with half in the subacromial space and half in glenohumeral space.  Patient tolerated the procedure well without immediate complications.

## 2014-01-24 NOTE — Telephone Encounter (Signed)
LVM for patient to call back to inform her that Dr. Raeford Razor received refill request for Vitamin D and it had been refused

## 2014-01-26 ENCOUNTER — Ambulatory Visit (INDEPENDENT_AMBULATORY_CARE_PROVIDER_SITE_OTHER): Payer: 59 | Admitting: Family Medicine

## 2014-01-26 VITALS — BP 138/73 | HR 73 | Temp 97.9°F | Ht 62.0 in | Wt 182.0 lb

## 2014-01-26 DIAGNOSIS — E559 Vitamin D deficiency, unspecified: Secondary | ICD-10-CM

## 2014-01-26 DIAGNOSIS — F411 Generalized anxiety disorder: Secondary | ICD-10-CM

## 2014-01-26 DIAGNOSIS — Z23 Encounter for immunization: Secondary | ICD-10-CM

## 2014-01-26 NOTE — Patient Instructions (Addendum)
Thank you for coming in,   If your vitamin d level is normal then we won't give you a call. We will start vitamin D supplement daily.   Follow up in three months.    Please feel free to call with any questions or concerns at any time, at [phone]. --Dr. Raeford Razor

## 2014-01-27 LAB — VITAMIN D 25 HYDROXY (VIT D DEFICIENCY, FRACTURES): VIT D 25 HYDROXY: 48 ng/mL (ref 30–89)

## 2014-01-28 ENCOUNTER — Encounter: Payer: Self-pay | Admitting: Family Medicine

## 2014-01-28 NOTE — Assessment & Plan Note (Signed)
No changes to current regimen. She is tolerating the medication and anxiety under control.  - continue current regimen

## 2014-01-28 NOTE — Progress Notes (Signed)
Subjective:    Patient ID: [name] [name], female    DOB: 1951/02/13, 63 y.o.   MRN: [id number]  HPI  [name] [name] is here for f/u of anxiety and vit d def.   Anxiety: states it is under control. She hasn't had any changes in her mood. Tolerating the medication well. She only has problems when she doesn't take her medication.   Vit D def: she finished the course of supplement. She states no leg or muscle cramps. She tolerated it well with no problems.    Current Outpatient Prescriptions on File Prior to Visit  Medication Sig Dispense Refill  . aspirin 81 MG EC tablet Take 81 mg by mouth daily. To help protect your heart     . cyclobenzaprine (FLEXERIL) 10 MG tablet TAKE 1 TABLET BY MOUTH AT BEDTIME FOR BACK PAIN AND AS SLEEP AID 30 tablet 1  . fluticasone (FLONASE) 50 MCG/ACT nasal spray Place 2 sprays into the nose daily. spray 1-2 sprays into both nostrils once a day for allergies 16 g 3  . hydrochlorothiazide (HYDRODIURIL) 25 MG tablet Take 1 tablet (25 mg total) by mouth daily. 90 tablet 3  . HYDROcodone-acetaminophen (NORCO) 5-325 MG per tablet Take 1 tablet by mouth every 6 (six) hours as needed for moderate pain. 60 tablet 0  . Minoxidil 5 % FOAM 5% foam solution. Apply 1/2 capful to scalp daily. 60 g 0  . sertraline (ZOLOFT) 50 MG tablet Take 1 tablet (50 mg total) by mouth daily. 30 tablet 3  . terbinafine (LAMISIL AT) 1 % cream Apply 1 application topically 2 (two) times daily. 30 g 0   No current facility-administered medications on file prior to visit.    SHx: quit taking the college course for now   Health Maintenance: received flu vaccine    Review of Systems See HPI     Objective:   Physical Exam BP 138/73 mmHg  Pulse 73  Temp(Src) 97.9 F (36.6 C) (Oral)  Ht 5\' 2"  (1.575 m)  Wt 182 lb (82.555 kg)  BMI 33.28 kg/m2 Gen: NAD, alert, cooperative with exam, well-appearing Skin: no rashes, normal turgor  Neuro: no gross deficits.  Psych: good  insight, alert and oriented        Assessment & Plan:

## 2014-01-28 NOTE — Assessment & Plan Note (Signed)
Completed 8 weeks of supplementation. Vit D level within normal range.  - Vit d 2000 IU daily. OTC

## 2014-01-29 ENCOUNTER — Other Ambulatory Visit: Payer: Self-pay

## 2014-01-29 DIAGNOSIS — Z1231 Encounter for screening mammogram for malignant neoplasm of breast: Secondary | ICD-10-CM

## 2014-02-08 ENCOUNTER — Other Ambulatory Visit: Payer: Self-pay | Admitting: Family Medicine

## 2014-02-08 DIAGNOSIS — I10 Essential (primary) hypertension: Secondary | ICD-10-CM

## 2014-02-08 NOTE — Telephone Encounter (Signed)
Pt called and needs a refill on her Hydrochlorothiazide. She said that pharmacy has faxed [place] several times. jw

## 2014-02-09 MED ORDER — HYDROCHLOROTHIAZIDE 25 MG PO TABS: 25.0000 mg | ORAL_TABLET | Freq: Every day | ORAL | Status: DC

## 2014-02-09 NOTE — Telephone Encounter (Signed)
LVM for patient to call back to inform her that Dr. Raeford Razor has sent in requested rx

## 2014-02-12 NOTE — Telephone Encounter (Signed)
LVM for patient to call back to inform her of below rx sent in

## 2014-02-13 ENCOUNTER — Ambulatory Visit
Admission: RE | Admit: 2014-02-13 | Discharge: 2014-02-13 | Disposition: A | Discharge status: Home / Self Care | Payer: 59 | Source: Ambulatory Visit

## 2014-02-13 DIAGNOSIS — Z1231 Encounter for screening mammogram for malignant neoplasm of breast: Secondary | ICD-10-CM

## 2014-02-27 ENCOUNTER — Encounter: Payer: Self-pay | Admitting: Family Medicine

## 2014-02-27 ENCOUNTER — Ambulatory Visit (INDEPENDENT_AMBULATORY_CARE_PROVIDER_SITE_OTHER): Payer: 59 | Admitting: Family Medicine

## 2014-02-27 VITALS — BP 134/67 | HR 93 | Temp 99.5°F | Ht 62.0 in | Wt 178.2 lb

## 2014-02-27 DIAGNOSIS — J329 Chronic sinusitis, unspecified: Secondary | ICD-10-CM

## 2014-02-27 MED ORDER — AZITHROMYCIN 250 MG PO TABS: ORAL_TABLET | ORAL | Status: DC

## 2014-02-27 NOTE — Patient Instructions (Signed)
You have either a sinus infection, the flu, or both.    If it's a virus like the flu, you should start feeling better in a couple days.  Take the Azithromycin for the next 5 days, 2 pills today and 1 pill daily after that.  You should start feeling better by Thurs or Friday of this week.  If not, come back and see [place].  Have a good holiday

## 2014-02-27 NOTE — Assessment & Plan Note (Signed)
Vs flu, possibly combination based on symptoms. Treat with Z-pak due to lack of symptom resolution. Discussed if she has flu on top of this, will have to clear on its own. Anti-pyretic and analgesic use discussed FU in 3-4 days if no improvement, sooner if worsneing.   Wrote out of work for rest today and tomorrow.

## 2014-02-27 NOTE — Progress Notes (Signed)
SUBJECTIVE: URI symptoms:  [name] is a 63 y.o. female who complains of URI symptoms present for past 8 days.  STarted as "scratchy throat" and progressed to sinus pressure, pain, drainage.  Still feels very congested, less sinus drainage now.  Alternating fever and chills at home.  Mild couhg.  Has tried nothing for relief.  Sick contacts are none.   Some nausea without vomiting.  Endorses body aches which started 2 days ago.  Spent Sunday in bed.  Went to work yesterday but feels this was a bad idea, more sore afterwards.  Soreness somewhat better today but not congestion.   OBJECTIVE: BP 134/67 mmHg  Pulse 93  Temp(Src) 99.5 F (37.5 C) (Oral)  Ht 5\' 2"  (1.575 m)  Wt 178 lb 3.2 oz (80.831 kg)  BMI 32.58 kg/m2 Gen:  Patient sitting on exam table, appears stated age in no acute distress Head: Normocephalic atraumatic Eyes: EOMI, PERRL, sclera and conjunctiva non-erythematous Ears:  Canals clear bilaterally.  TMs pearly gray bilaterally without erythema or bulging.   Nose:  Nasal turbinates grossly enlarged bilaterally. Some exudates noted. Tender to palpation of maxillary sinus  Mouth: Mucosa membranes moist. Tonsils +2, nonenlarged, but erythematous appearing. Neck: No cervical lymphadenopathy noted Heart:  RRR, no murmurs auscultated. Pulm:  Clear to auscultation bilaterally with good air movement.  No wheezes or rales noted.

## 2014-04-19 ENCOUNTER — Other Ambulatory Visit: Payer: Self-pay | Admitting: *Deleted

## 2014-04-19 DIAGNOSIS — F419 Anxiety disorder, unspecified: Secondary | ICD-10-CM

## 2014-04-19 NOTE — Telephone Encounter (Signed)
Patient calling requesting refill on zoloft.

## 2014-04-23 MED ORDER — SERTRALINE HCL 50 MG PO TABS: 50.0000 mg | ORAL_TABLET | Freq: Every day | ORAL | Status: DC

## 2014-05-02 ENCOUNTER — Ambulatory Visit: Payer: Self-pay | Admitting: Family Medicine

## 2014-05-25 ENCOUNTER — Encounter: Payer: Self-pay | Admitting: Family Medicine

## 2014-05-25 ENCOUNTER — Ambulatory Visit (INDEPENDENT_AMBULATORY_CARE_PROVIDER_SITE_OTHER): Payer: Medicare Other | Admitting: Family Medicine

## 2014-05-25 VITALS — BP 136/76 | HR 67 | Temp 98.4°F | Wt 178.0 lb

## 2014-05-25 DIAGNOSIS — F411 Generalized anxiety disorder: Secondary | ICD-10-CM | POA: Diagnosis not present

## 2014-05-25 DIAGNOSIS — F419 Anxiety disorder, unspecified: Secondary | ICD-10-CM

## 2014-05-25 DIAGNOSIS — E139 Other specified diabetes mellitus without complications: Secondary | ICD-10-CM

## 2014-05-25 DIAGNOSIS — I10 Essential (primary) hypertension: Secondary | ICD-10-CM

## 2014-05-25 DIAGNOSIS — G4700 Insomnia, unspecified: Secondary | ICD-10-CM

## 2014-05-25 DIAGNOSIS — R21 Rash and other nonspecific skin eruption: Secondary | ICD-10-CM

## 2014-05-25 DIAGNOSIS — E7800 Pure hypercholesterolemia, unspecified: Secondary | ICD-10-CM

## 2014-05-25 DIAGNOSIS — R7303 Prediabetes: Secondary | ICD-10-CM

## 2014-05-25 DIAGNOSIS — R7309 Other abnormal glucose: Secondary | ICD-10-CM

## 2014-05-25 LAB — LIPID PANEL
CHOLESTEROL: 196 mg/dL (ref 0–200)
HDL: 53 mg/dL (ref >=46)
LDL Cholesterol: 124 mg/dL — ABNORMAL HIGH (ref 0–99)
TRIGLYCERIDES: 96 mg/dL (ref <150)
Total CHOL/HDL Ratio: 3.7 Ratio
VLDL: 19 mg/dL (ref 0–40)

## 2014-05-25 LAB — POCT GLYCOSYLATED HEMOGLOBIN (HGB A1C): HEMOGLOBIN A1C: 5.7

## 2014-05-25 MED ORDER — TRAZODONE HCL 50 MG PO TABS: 25.0000 mg | ORAL_TABLET | Freq: Every evening | ORAL | Status: DC | PRN

## 2014-05-25 MED ORDER — SERTRALINE HCL 50 MG PO TABS: 75.0000 mg | ORAL_TABLET | Freq: Every day | ORAL | Status: DC

## 2014-05-25 MED ORDER — TRIAMCINOLONE ACETONIDE 0.1 % EX CREA: 1.0000 "application " | TOPICAL_CREAM | Freq: Two times a day (BID) | CUTANEOUS | Status: DC

## 2014-05-25 NOTE — Patient Instructions (Addendum)
Thank you for coming in,   We increased the zoloft to 75 mg.   I will call you with the lab results.   Please let me know how you tolerate the trazodone.    Please feel free to call with any questions or concerns at any time, at [phone]. --Dr. Raeford Razor

## 2014-05-25 NOTE — Assessment & Plan Note (Signed)
Currently diet controlled.

## 2014-05-25 NOTE — Assessment & Plan Note (Signed)
Hyperpigmented spots on her back. No other rashes anywhere else. No arthralgias or problems voiding. No changes in soaps or detergents, no new medications. No exposure to pets. Could be related to dry skin.  - continue with emollients  - triamcinolone PRN .  - f/u if no improvement.

## 2014-05-25 NOTE — Progress Notes (Signed)
Subjective:    Patient ID: [name] [name], female    DOB: 06-Aug-1950, 64 y.o.   MRN: [id number]  HPI  [name] [name] is here for anxiety, rash and HTN.  ANXIETY Disease Monitoring Current symptoms include having occurrences that she feels that she is out of her body.              Symptoms have been stable      Is Exercising trying to exercise     Medication Monitoring Compliance: taking but has been taking more and less than prescribed   Decreased Libido no        Lightheadedness no      Insomnia off and on.    GI symptoms no  PMH Previous treatment includes: medication fluoxetine   HTN Disease Monitoring: Home BP Monitoring yes Chest pain- no    Dyspnea- no Medications:HCTZ  Compliance-  yes. Lightheadedness-  no Edema- no.   Rash: not really itchy and only occuring on her back. Noticed that she had some spots on her back. Has been using the oatmeal soap. Noticed it about a month to 1.5 ago. No allergies. No hx of eczema. No change in soap or detergents. Has little dogs at home but no fleas. No one else in the house with similar symptoms. No new medications or joint pain out of the ordinary. No problems with voiding.     Disease Monitoring: Diagnosis of pre-diabetes. Hx of taking metformin but intolerant due to GI .   Blood Sugar Ranges: n/a   Polyuria: no   Visual problems: no   Medication Compliance: n/a  Medication Side Effects  Hypoglycemia: n/a   Preventitive Health Care  Exercise: trying to exercise more     Current Outpatient Prescriptions on File Prior to Visit  Medication Sig Dispense Refill  . aspirin 81 MG EC tablet Take 81 mg by mouth daily. To help protect your heart     . fluticasone (FLONASE) 50 MCG/ACT nasal spray Place 2 sprays into the nose daily. spray 1-2 sprays into both nostrils once a day for allergies 16 g 3  . hydrochlorothiazide (HYDRODIURIL) 25 MG tablet Take 1 tablet (25 mg total) by mouth daily. 90 tablet 3  . sertraline  (ZOLOFT) 50 MG tablet Take 1 tablet (50 mg total) by mouth daily. 30 tablet 3  . cyclobenzaprine (FLEXERIL) 10 MG tablet TAKE 1 TABLET BY MOUTH AT BEDTIME FOR BACK PAIN AND AS SLEEP AID (Patient not taking: Reported on 05/25/2014) 30 tablet 1  . Minoxidil 5 % FOAM 5% foam solution. Apply 1/2 capful to scalp daily. (Patient not taking: Reported on 05/25/2014) 60 g 0  . terbinafine (LAMISIL AT) 1 % cream Apply 1 application topically 2 (two) times daily. (Patient not taking: Reported on 05/25/2014) 30 g 0   No current facility-administered medications on file prior to visit.   Health Maintenance: Hgb A1c today   Review of Systems See HPI     Objective:   Physical Exam BP 136/76 mmHg  Pulse 67  Temp(Src) 98.4 F (36.9 C) (Oral)  Wt 178 lb (80.74 kg) Gen: NAD, alert, cooperative with exam, well-appearing CV: RRR, good S1/S2, no murmur, no edema Resp: CTABL, no wheezes, non-labored Skin: hyperpigmented spots on back and no where else.  Neuro: no gross deficits.       Assessment & Plan:

## 2014-05-25 NOTE — Assessment & Plan Note (Signed)
Lipid profile today 

## 2014-05-25 NOTE — Assessment & Plan Note (Signed)
Has episodes to where she feels "out of body." Also has moments when she wants to be alone but this has improved since being on medication.  - increase zoloft 75 mg daily  - added trazodone 25 mg PRN to help with sleep.  - f/u in 1.5 months.

## 2014-05-25 NOTE — Assessment & Plan Note (Signed)
Well controlled. Continue medication.

## 2014-05-29 ENCOUNTER — Encounter: Payer: Self-pay | Admitting: Family Medicine

## 2014-05-29 ENCOUNTER — Telehealth: Payer: Self-pay | Admitting: Family Medicine

## 2014-05-29 NOTE — Telephone Encounter (Signed)
Unable to leave voicemail as mailbox is full. Was going to discuss lab results. Indicating mod to high intensity statin.   Rosemarie Ax, MD PGY-2, [facility] 05/29/2014, 1:34 PM

## 2014-08-29 ENCOUNTER — Telehealth: Payer: Self-pay | Admitting: Family Medicine

## 2014-08-29 DIAGNOSIS — F419 Anxiety disorder, unspecified: Secondary | ICD-10-CM

## 2014-08-29 DIAGNOSIS — R252 Cramp and spasm: Secondary | ICD-10-CM

## 2014-08-29 NOTE — Telephone Encounter (Signed)
Needs refill on flexeril  [facility] on [address] cetraline-dosage is suppose to be upped to 75.  Needs refill on this also

## 2014-08-30 MED ORDER — SERTRALINE HCL 50 MG PO TABS: 75.0000 mg | ORAL_TABLET | Freq: Every day | ORAL | Status: DC

## 2014-08-30 MED ORDER — CYCLOBENZAPRINE HCL 10 MG PO TABS: ORAL_TABLET | ORAL | Status: DC

## 2014-08-30 NOTE — Telephone Encounter (Signed)
Refilled flexeril and zoloft at 75 mg QD.   Rosemarie Ax, MD PGY-2, [facility] 08/30/2014, 8:44 AM

## 2014-09-27 ENCOUNTER — Ambulatory Visit: Payer: Medicare Other | Admitting: Sports Medicine

## 2014-09-28 ENCOUNTER — Ambulatory Visit (INDEPENDENT_AMBULATORY_CARE_PROVIDER_SITE_OTHER): Payer: Medicare Other | Admitting: Family Medicine

## 2014-09-28 ENCOUNTER — Ambulatory Visit: Payer: Medicare Other | Admitting: Family Medicine

## 2014-09-28 ENCOUNTER — Encounter: Payer: Self-pay | Admitting: Family Medicine

## 2014-09-28 VITALS — BP 136/64 | HR 71 | Temp 98.2°F | Ht 62.0 in | Wt 171.9 lb

## 2014-09-28 DIAGNOSIS — M545 Low back pain, unspecified: Secondary | ICD-10-CM

## 2014-09-28 DIAGNOSIS — M549 Dorsalgia, unspecified: Secondary | ICD-10-CM | POA: Insufficient documentation

## 2014-09-28 DIAGNOSIS — L0292 Furuncle, unspecified: Secondary | ICD-10-CM

## 2014-09-28 MED ORDER — CLINDAMYCIN HCL 300 MG PO CAPS: 300.0000 mg | ORAL_CAPSULE | Freq: Three times a day (TID) | ORAL | Status: DC

## 2014-09-28 MED ORDER — NAPROXEN 500 MG PO TABS: 500.0000 mg | ORAL_TABLET | Freq: Two times a day (BID) | ORAL | Status: DC

## 2014-09-28 NOTE — Patient Instructions (Signed)
Thank you for coming in,   Please follow up with me if your boil doesn't get better with the antibiotics.   Please follow up with me in 4 weeks if there is no improvement in your back pain.   You can take the flexeril three times a day.   Please bring all of your medications with you to each visit.    Please feel free to call with any questions or concerns at any time, at [phone]. --Dr. Raeford Razor

## 2014-09-28 NOTE — Progress Notes (Signed)
Subjective:    Patient ID: [name] [name], female    DOB: [birth year], 64 y.o.   MRN: [id number]  HPI  [name] [name] is here for back spasms.   Back spasms: started last weekend and lasted for about a week. Occuring in the right lower back. worse with movement. Denies any injury. Thinks that is related to sleeping on a memory foam mattress. She has since removed it from her bed.  Pain doesn't radiate. Denies any weakness, paresthesia, urinary or bowel incontinence or fever or night sweats. She take flexeril at night but not taking any other medication.  Seems to be uncharged since it started.   Boil: occurring in right inguinal area. Started two days ago and she "popped" it.  Seems to be getting better since she "popped" it.  She has a hx of boils with similar presentations. Tenderness in the right groin has improved.  Denies any fever or chills, dysuria, or vaginal discharge.     Current Outpatient Prescriptions on File Prior to Visit  Medication Sig Dispense Refill  . aspirin 81 MG EC tablet Take 81 mg by mouth daily. To help protect your heart     . cyclobenzaprine (FLEXERIL) 10 MG tablet TAKE 1 TABLET BY MOUTH AT BEDTIME FOR BACK PAIN AND AS SLEEP AID 30 tablet 1  . fluticasone (FLONASE) 50 MCG/ACT nasal spray Place 2 sprays into the nose daily. spray 1-2 sprays into both nostrils once a day for allergies 16 g 3  . hydrochlorothiazide (HYDRODIURIL) 25 MG tablet Take 1 tablet (25 mg total) by mouth daily. 90 tablet 3  . Minoxidil 5 % FOAM 5% foam solution. Apply 1/2 capful to scalp daily. (Patient not taking: Reported on 05/25/2014) 60 g 0  . sertraline (ZOLOFT) 50 MG tablet Take 1.5 tablets (75 mg total) by mouth daily. 60 tablet 3  . terbinafine (LAMISIL AT) 1 % cream Apply 1 application topically 2 (two) times daily. (Patient not taking: Reported on 05/25/2014) 30 g 0  . traZODone (DESYREL) 50 MG tablet Take 0.5 tablets (25 mg total) by mouth at bedtime as needed for sleep. 30  tablet 3  . triamcinolone cream (KENALOG) 0.1 % Apply 1 application topically 2 (two) times daily. 30 g 0   No current facility-administered medications on file prior to visit.    Review of Systems See HPI     Objective:   Physical Exam BP 136/64 mmHg  Pulse 71  Temp(Src) 98.2 F (36.8 C) (Oral)  Ht 5\' 2"  (1.575 m)  Wt 171 lb 14.4 oz (77.973 kg)  BMI 31.43 kg/m2 Gen: NAD, alert, cooperative with exam, well-appearing Skin: induration and erythema in right inguinal fold. No active draining and no fluctuance noted. Two vesicle. Two lesions with moist base on right buttock near gluteal fold and perineum  MSK no erythema or ecchymosis on her back  ttp on right lower lumbar area, no spinal tenderness or step offs  Normal hip flexion Normal internal and external hip rotation  FABER on right pos  5/5 strength in LE B/L  Normal sensation in LE B/L  Normal plantar and dorsal flexion  Normal gait  Neurovascularly intact         Assessment & Plan:

## 2014-09-28 NOTE — Assessment & Plan Note (Signed)
Occuring in right inguinal fold. Reports that it has popped two days ago.  Currently indurated and no fluctuance palpated to I&D. Some tenderness and erythema  Two other lesions appreciated on her right buttock but appear to be vesicles that have popped  - clindamycin 300 TID for 7 days.  - f/u after ABX if no improvement or worsening of symptoms.

## 2014-09-28 NOTE — Assessment & Plan Note (Signed)
Most likely a muscle strain. No radiculopathy and no urinary or bowel incontinence. No night sweats or fatigue  - advised to increase flexeril to 10 mg TID  - naproxen for 14 days. Then ibuprofen or tylenol PRN  - given home modalities  - f/u in 4 weeks, if no improvement then consider trial of PT

## 2014-10-31 ENCOUNTER — Ambulatory Visit: Payer: Medicare Other | Admitting: Family Medicine

## 2014-11-07 ENCOUNTER — Ambulatory Visit (INDEPENDENT_AMBULATORY_CARE_PROVIDER_SITE_OTHER): Payer: Medicare Other | Admitting: Family Medicine

## 2014-11-07 ENCOUNTER — Encounter: Payer: Self-pay | Admitting: Family Medicine

## 2014-11-07 VITALS — BP 115/43 | HR 68 | Temp 98.2°F | Ht 62.0 in | Wt 175.6 lb

## 2014-11-07 DIAGNOSIS — Z1159 Encounter for screening for other viral diseases: Secondary | ICD-10-CM

## 2014-11-07 DIAGNOSIS — Z114 Encounter for screening for human immunodeficiency virus [HIV]: Secondary | ICD-10-CM

## 2014-11-07 DIAGNOSIS — R21 Rash and other nonspecific skin eruption: Secondary | ICD-10-CM

## 2014-11-07 DIAGNOSIS — R55 Syncope and collapse: Secondary | ICD-10-CM

## 2014-11-07 DIAGNOSIS — I10 Essential (primary) hypertension: Secondary | ICD-10-CM | POA: Diagnosis not present

## 2014-11-07 LAB — BASIC METABOLIC PANEL WITH GFR
BUN: 16 mg/dL (ref 7–25)
CHLORIDE: 102 mmol/L (ref 98–110)
CO2: 27 mmol/L (ref 20–31)
Calcium: 9.5 mg/dL (ref 8.6–10.4)
Creat: 0.99 mg/dL (ref 0.50–0.99)
GFR, Est African American: 69 mL/min (ref >=60)
GFR, Est Non African American: 60 mL/min (ref >=60)
Glucose, Bld: 102 mg/dL — ABNORMAL HIGH (ref 65–99)
Potassium: 3.9 mmol/L (ref 3.5–5.3)
Sodium: 139 mmol/L (ref 135–146)

## 2014-11-07 MED ORDER — TRIAMCINOLONE ACETONIDE 0.1 % EX CREA: 1.0000 "application " | TOPICAL_CREAM | Freq: Two times a day (BID) | CUTANEOUS | Status: DC

## 2014-11-07 NOTE — Patient Instructions (Signed)
Thank you for coming in,   I placed the referral to Cardiology.   I will either call or send a letter with the results from your lab work.   Please seek immediate care if you pass out completely.   Please bring all of your medications with you to each visit.    Please feel free to call with any questions or concerns at any time, at [phone]. --Dr. Raeford Razor

## 2014-11-08 ENCOUNTER — Encounter: Payer: Self-pay | Admitting: Family Medicine

## 2014-11-08 LAB — HEPATITIS C ANTIBODY: HCV AB: NEGATIVE

## 2014-11-08 LAB — HIV ANTIBODY (ROUTINE TESTING W REFLEX): HIV 1&2 Ab, 4th Generation: NONREACTIVE

## 2014-11-09 DIAGNOSIS — R55 Syncope and collapse: Secondary | ICD-10-CM | POA: Insufficient documentation

## 2014-11-09 NOTE — Assessment & Plan Note (Signed)
Most likely vasovagal vs orthostatic.  - advised to stay well hydrated in outside.  - if re-occurs and has complete syncope then should seek immediate care  - due to her concern, hx of MVP and abnormal stress test will refer back to Dr. Stanford Breed.

## 2014-11-09 NOTE — Progress Notes (Signed)
Subjective:    [name] [name] - 64 y.o. female MRN [id number]  Date of birth: 05-11-1950  HPI  [name] [name] is here for near syncope episode.   This has happened 2-3 times in the past two months. She was riding on her motorcycle and when she went to stand she had this sensation come all over her body. She had tunnel vision and began to sweat. She was able to sit down and her symptoms resolved. She had this same thing happen to her when she was about to stand from a seated position.  She denies any chest pain or SOB.  She only takes trazodone and flexeril as needed and not on a daily basis. She has history of MVP reported and no aortic stenosis on ECHO in 2011.  She has been seen by Cardiology (Dr. Jens Som) before in 2011.   Health Maintenance:  - NOT DIABETIC  - HEP C today and HIV today  Health Maintenance Due  Topic Date Due  . FOOT EXAM  04/24/1959  . OPHTHALMOLOGY EXAM  04/24/1959  . URINE MICROALBUMIN  04/24/1959  . DEXA SCAN  04/23/2014  . PNA vac Low Risk Adult (1 of 2 - PCV13) 04/23/2014  . TETANUS/TDAP  06/29/2014  . INFLUENZA VACCINE  10/29/2014    -  reports that she quit smoking about 10 years ago. She has never used smokeless tobacco. - Review of Systems: Per HPI. All other systems reviewed and are negative. - Past Medical History: Patient Active Problem List   Diagnosis Date Noted  . Back pain 09/28/2014  . Boil 09/28/2014  . Vitamin D deficiency 11/15/2013  . Prediabetes 03/01/2012  . Rotator cuff tear, left 06/09/2011  . Bipolar disorder 11/12/2010  . Anxiety state 05/31/2009  . MITRAL VALVE PROLAPSE 04/15/2009  . CARPAL TUNNEL SYNDROME 01/26/2008  . DEPRESSIVE DISORDER 01/25/2008  . OBESITY 01/04/2008  . HYPERCHOLESTEROLEMIA 06/24/2006  . Essential hypertension 06/24/2006   - Medications: reviewed and updated Current Outpatient Prescriptions  Medication Sig Dispense Refill  . aspirin 81 MG EC tablet Take 81 mg by mouth daily. To help protect  your heart     . clindamycin (CLEOCIN) 300 MG capsule Take 1 capsule (300 mg total) by mouth 3 (three) times daily. 21 capsule 0  . cyclobenzaprine (FLEXERIL) 10 MG tablet TAKE 1 TABLET BY MOUTH AT BEDTIME FOR BACK PAIN AND AS SLEEP AID 30 tablet 1  . fluticasone (FLONASE) 50 MCG/ACT nasal spray Place 2 sprays into the nose daily. spray 1-2 sprays into both nostrils once a day for allergies 16 g 3  . hydrochlorothiazide (HYDRODIURIL) 25 MG tablet Take 1 tablet (25 mg total) by mouth daily. 90 tablet 3  . Minoxidil 5 % FOAM 5% foam solution. Apply 1/2 capful to scalp daily. (Patient not taking: Reported on 05/25/2014) 60 g 0  . naproxen (NAPROSYN) 500 MG tablet Take 1 tablet (500 mg total) by mouth 2 (two) times daily with a meal. 28 tablet 0  . sertraline (ZOLOFT) 50 MG tablet Take 1.5 tablets (75 mg total) by mouth daily. 60 tablet 3  . terbinafine (LAMISIL AT) 1 % cream Apply 1 application topically 2 (two) times daily. (Patient not taking: Reported on 05/25/2014) 30 g 0  . traZODone (DESYREL) 50 MG tablet Take 0.5 tablets (25 mg total) by mouth at bedtime as needed for sleep. 30 tablet 3  . triamcinolone cream (KENALOG) 0.1 % Apply 1 application topically 2 (two) times daily. 30 g 0  No current facility-administered medications for this visit.     Review of Systems See HPI     Objective:   Physical Exam BP 115/43 mmHg  Pulse 68  Temp(Src) 98.2 F (36.8 C) (Oral)  Ht 5\' 2"  (1.575 m)  Wt 175 lb 9.6 oz (79.652 kg)  BMI 32.11 kg/m2 Gen: NAD, alert, cooperative with exam, well-appearing CV: RRR, good S1/S2, no murmur, no edema, capillary refill brisk  Resp: CTABL, no wheezes, non-labored Neuro: no gross deficits.    Orthostatic vital signs:  Lying 115/43  HR 68 Sitting 123/86 HR 71 Standing 128/68 HR 68      Assessment & Plan:

## 2014-12-18 NOTE — Progress Notes (Signed)
HPI: 64 year old female for evaluation of near syncope. H/O MVP. Carotid Dopplers 2010 showed no significant stenosis.  Stress echocardiogram February 2011 normal. Echocardiogram 2011 showed normal LV function. Patient states that for the past several months she has had instances of dizziness immediately after standing. These typically last approximately 1 minute and resolved. She otherwise denies dyspnea on exertion, orthopnea, PND, pedal edema, palpitations, syncope or exertional chest pain. She states she does maintain fluid intake.  Current Outpatient Prescriptions  Medication Sig Dispense Refill  . clindamycin (CLEOCIN) 300 MG capsule Take 1 capsule (300 mg total) by mouth 3 (three) times daily. 21 capsule 0  . cyclobenzaprine (FLEXERIL) 10 MG tablet TAKE 1 TABLET BY MOUTH AT BEDTIME FOR BACK PAIN AND AS SLEEP AID 30 tablet 1  . fluticasone (FLONASE) 50 MCG/ACT nasal spray Place 2 sprays into the nose daily. spray 1-2 sprays into both nostrils once a day for allergies 16 g 3  . hydrochlorothiazide (HYDRODIURIL) 25 MG tablet Take 1 tablet (25 mg total) by mouth daily. 90 tablet 3  . sertraline (ZOLOFT) 50 MG tablet Take 1.5 tablets (75 mg total) by mouth daily. 60 tablet 3  . terbinafine (LAMISIL AT) 1 % cream Apply 1 application topically 2 (two) times daily. 30 g 0  . traZODone (DESYREL) 50 MG tablet Take 0.5 tablets (25 mg total) by mouth at bedtime as needed for sleep. 30 tablet 3  . triamcinolone cream (KENALOG) 0.1 % Apply 1 application topically 2 (two) times daily. 30 g 0   No current facility-administered medications for this visit.    No Known Allergies   Past Medical History  Diagnosis Date  . Hypertension   . Depression   . Hyperlipidemia   . Mitral valve prolapse     Past Surgical History  Procedure Laterality Date  . Partial hysterectomy      Social History   Social History  . Marital Status: Married    Spouse Name: N/A  . Number of Children: 3  . Years  of Education: N/A   Occupational History  .      Retired   Social History Main Topics  . Smoking status: Former Smoker    Quit date: 07/30/2004  . Smokeless tobacco: Never Used  . Alcohol Use: 0.0 oz/week    0 Standard drinks or equivalent per week     Comment: Occasional  . Drug Use: No  . Sexual Activity:    Partners: Male   Other Topics Concern  . Not on file   Social History Narrative    Family History  Problem Relation Age of Onset  . Heart disease Mother     CHF    ROS: no fevers or chills, productive cough, hemoptysis, dysphasia, odynophagia, melena, hematochezia, dysuria, hematuria, rash, seizure activity, orthopnea, PND, pedal edema, claudication. Remaining systems are negative.  Physical Exam:   Blood pressure 131/75, pulse 61, height 5\' 2"  (1.575 m), weight 76.567 kg (168 lb 12.8 oz).  General:  Well developed/well nourished in NAD Skin warm/dry Patient not depressed No peripheral clubbing Back-normal HEENT-normal/normal eyelids Neck supple/normal carotid upstroke bilaterally; no bruits; no JVD; no thyromegaly chest - CTA/ normal expansion CV - RRR/normal S1 and S2; no rubs or gallops;  PMI nondisplaced, 1/6 systolic murmur apex. Abdomen -NT/ND, no HSM, no mass, + bowel sounds, no bruit 2+ femoral pulses, no bruits Ext-no edema, chords, 2+ DP Neuro-grossly nonfocal  ECG sinus rhythm at a rate of 61. No ST changes.

## 2014-12-19 ENCOUNTER — Ambulatory Visit (INDEPENDENT_AMBULATORY_CARE_PROVIDER_SITE_OTHER): Payer: Medicare Other | Admitting: Cardiology

## 2014-12-19 ENCOUNTER — Encounter: Payer: Self-pay | Admitting: Cardiology

## 2014-12-19 VITALS — BP 131/75 | HR 61 | Ht 62.0 in | Wt 168.8 lb

## 2014-12-19 DIAGNOSIS — I10 Essential (primary) hypertension: Secondary | ICD-10-CM | POA: Diagnosis not present

## 2014-12-19 DIAGNOSIS — I059 Rheumatic mitral valve disease, unspecified: Secondary | ICD-10-CM

## 2014-12-19 DIAGNOSIS — E7800 Pure hypercholesterolemia, unspecified: Secondary | ICD-10-CM

## 2014-12-19 DIAGNOSIS — R55 Syncope and collapse: Secondary | ICD-10-CM

## 2014-12-19 NOTE — Patient Instructions (Signed)
Your physician recommends that you schedule a follow-up appointment in: [place] [facility]  Your physician has requested that you have an echocardiogram. Echocardiography is a painless test that uses sound waves to create images of your heart. It provides your doctor with information about the size and shape of your heart and how well your heart's chambers and valves are working. This procedure takes approximately one hour. There are no restrictions for this procedure.   TRACK BLOOD PRESSURE AT HOME AND CALL IF >140 OR >86

## 2014-12-19 NOTE — Assessment & Plan Note (Addendum)
Patient symptoms seen most consistent with orthostatic hypotension although not orthostatic in the office today. We will discontinue her hydrochlorothiazide as outlined under hypertension. She will follow her blood pressure and we will add Norvasc if it increases. I have asked her to increase her fluid intake and sodium intake. Hopefully her symptoms will resolve with above measures.

## 2014-12-19 NOTE — Assessment & Plan Note (Signed)
Management per primary care. 

## 2014-12-19 NOTE — Assessment & Plan Note (Signed)
Patient is having problems with orthostatic symptoms. I will discontinue her hydrochlorothiazide. She will follow her blood pressure at home and if it increases we will add low-dose calcium blocker. Hopefully discontinuing her diuretics will improve her orthostatic symptoms.

## 2014-12-19 NOTE — Assessment & Plan Note (Signed)
Repeat echocardiogram. 

## 2014-12-26 ENCOUNTER — Ambulatory Visit (HOSPITAL_BASED_OUTPATIENT_CLINIC_OR_DEPARTMENT_OTHER)
Admission: RE | Admit: 2014-12-26 | Discharge: 2014-12-26 | Disposition: A | Discharge status: Home / Self Care | Payer: Medicare Other | Source: Ambulatory Visit | Attending: Cardiology | Admitting: Cardiology

## 2014-12-26 DIAGNOSIS — I059 Rheumatic mitral valve disease, unspecified: Secondary | ICD-10-CM | POA: Diagnosis not present

## 2014-12-26 DIAGNOSIS — R55 Syncope and collapse: Secondary | ICD-10-CM

## 2014-12-26 DIAGNOSIS — I071 Rheumatic tricuspid insufficiency: Secondary | ICD-10-CM | POA: Diagnosis not present

## 2014-12-26 DIAGNOSIS — I10 Essential (primary) hypertension: Secondary | ICD-10-CM | POA: Diagnosis not present

## 2014-12-26 NOTE — Progress Notes (Signed)
*  PRELIMINARY RESULTS* Echocardiogram 2D Echocardiogram has been performed.  Leavy Cella 12/26/2014, 12:54 PM

## 2015-01-04 ENCOUNTER — Telehealth: Payer: Self-pay | Admitting: Cardiology

## 2015-01-04 MED ORDER — AMLODIPINE BESYLATE 5 MG PO TABS: 5.0000 mg | ORAL_TABLET | Freq: Every day | ORAL | Status: DC

## 2015-01-04 NOTE — Telephone Encounter (Signed)
Spoke with [name] [name], her bp after the HCTZ was stopped is running 160/83, 174/91, 181/98,157/86 and 149/75. She took the HCTZ  Wednesday night and her bp Thursday am was 138/72. She wants to know if she should restart the HCTZ or start something else. Will forward for dr Stanford Breed review

## 2015-01-04 NOTE — Telephone Encounter (Signed)
Would not resume HCTZ; add norvasc 5 mg daily and follow BP. [name] [name]

## 2015-01-04 NOTE — Telephone Encounter (Signed)
I called and spoke to [name] [name] and scheduled her to come in and see Dr. Stanford Breed because her BP was elevated. She stated that at her last visit Dr. Stanford Breed discontinued her BP medication but since her levels have been elevated, she has been taking one pill a day and it seems to be working but she would like to speak to a nurse for further advise. Please call  Thanks

## 2015-01-04 NOTE — Telephone Encounter (Signed)
Spoke with pt, Aware of dr Jacalyn Lefevre recommendations.  New script sent to the pharmacy. She will bring her bp log to her appt.

## 2015-01-11 ENCOUNTER — Ambulatory Visit: Payer: Medicare Other | Admitting: Cardiology

## 2015-01-22 NOTE — Progress Notes (Signed)
      HPI: fu near syncope. H/O MVP. Carotid Dopplers 2010 showed no significant stenosis. Stress echocardiogram February 2011 normal. Echocardiogram 2011 showed normal LV function. Echocardiogram repeated September 2016 and showed normal LV function. At last office visit hydrochlorothiazide discontinued because of orthostatic symptoms. Since then, She denies any further dizziness. No chest pain, dyspnea or syncope.  Current Outpatient Prescriptions  Medication Sig Dispense Refill  . amLODipine (NORVASC) 5 MG tablet Take 1 tablet (5 mg total) by mouth daily. 30 tablet 6  . aspirin 81 MG tablet Take 81 mg by mouth daily.    . cyclobenzaprine (FLEXERIL) 10 MG tablet TAKE 1 TABLET BY MOUTH AT BEDTIME FOR BACK PAIN AND AS SLEEP AID 30 tablet 1  . fluticasone (FLONASE) 50 MCG/ACT nasal spray Place 2 sprays into the nose daily. spray 1-2 sprays into both nostrils once a day for allergies 16 g 3  . sertraline (ZOLOFT) 50 MG tablet Take 1.5 tablets (75 mg total) by mouth daily. 60 tablet 3  . traZODone (DESYREL) 50 MG tablet Take 0.5 tablets (25 mg total) by mouth at bedtime as needed for sleep. 30 tablet 3  . triamcinolone cream (KENALOG) 0.1 % Apply 1 application topically 2 (two) times daily. 30 g 0   No current facility-administered medications for this visit.     Past Medical History  Diagnosis Date  . Hypertension   . Depression   . Hyperlipidemia   . Mitral valve prolapse     Past Surgical History  Procedure Laterality Date  . Partial hysterectomy      Social History   Social History  . Marital Status: Married    Spouse Name: N/A  . Number of Children: 3  . Years of Education: N/A   Occupational History  .      Retired   Social History Main Topics  . Smoking status: Former Smoker    Quit date: 07/30/2004  . Smokeless tobacco: Never Used  . Alcohol Use: 0.0 oz/week    0 Standard drinks or equivalent per week     Comment: Occasional  . Drug Use: No  . Sexual  Activity:    Partners: Male   Other Topics Concern  . Not on file   Social History Narrative    ROS: no fevers or chills, productive cough, hemoptysis, dysphasia, odynophagia, melena, hematochezia, dysuria, hematuria, rash, seizure activity, orthopnea, PND, pedal edema, claudication. Remaining systems are negative.  Physical Exam: Well-developed well-nourished in no acute distress.  Skin is warm and dry.  HEENT is normal.  Neck is supple.  Chest is clear to auscultation with normal expansion.  Cardiovascular exam is regular rate and rhythm.  Abdominal exam nontender or distended. No masses palpated. Extremities show no edema. neuro grossly intact

## 2015-01-23 ENCOUNTER — Encounter: Payer: Self-pay | Admitting: Cardiology

## 2015-01-23 ENCOUNTER — Ambulatory Visit (INDEPENDENT_AMBULATORY_CARE_PROVIDER_SITE_OTHER): Payer: Medicare Other | Admitting: Cardiology

## 2015-01-23 VITALS — BP 134/67 | HR 61 | Ht 62.0 in | Wt 173.0 lb

## 2015-01-23 DIAGNOSIS — I059 Rheumatic mitral valve disease, unspecified: Secondary | ICD-10-CM | POA: Diagnosis not present

## 2015-01-23 DIAGNOSIS — I10 Essential (primary) hypertension: Secondary | ICD-10-CM

## 2015-01-23 DIAGNOSIS — R55 Syncope and collapse: Secondary | ICD-10-CM | POA: Diagnosis not present

## 2015-01-23 DIAGNOSIS — E7800 Pure hypercholesterolemia, unspecified: Secondary | ICD-10-CM | POA: Diagnosis not present

## 2015-01-23 NOTE — Assessment & Plan Note (Signed)
Management per primary care. 

## 2015-01-23 NOTE — Assessment & Plan Note (Signed)
Previous episodes felt secondary to orthostasis. Her symptoms have resolved off of diuretics. I have encouraged her to continue increased po fluid intake.

## 2015-01-23 NOTE — Assessment & Plan Note (Signed)
Most recent echocardiogram did not show significant mitral regurgitation.

## 2015-01-23 NOTE — Assessment & Plan Note (Signed)
Blood pressure controlled. Continue present medications. 

## 2015-01-23 NOTE — Patient Instructions (Signed)
Your physician wants you to follow-up in: ONE YEAR WITH DR CRENSHAW You will receive a reminder letter in the mail two months in advance. If you don't receive a letter, please call our office to schedule the follow-up appointment.  

## 2015-01-28 ENCOUNTER — Encounter: Payer: Self-pay | Admitting: Family Medicine

## 2015-01-28 ENCOUNTER — Ambulatory Visit (INDEPENDENT_AMBULATORY_CARE_PROVIDER_SITE_OTHER): Payer: Medicare Other | Admitting: Family Medicine

## 2015-01-28 VITALS — BP 139/78 | HR 66 | Temp 98.5°F | Ht 62.0 in | Wt 170.8 lb

## 2015-01-28 DIAGNOSIS — E7800 Pure hypercholesterolemia, unspecified: Secondary | ICD-10-CM

## 2015-01-28 DIAGNOSIS — F329 Major depressive disorder, single episode, unspecified: Secondary | ICD-10-CM

## 2015-01-28 DIAGNOSIS — Z8639 Personal history of other endocrine, nutritional and metabolic disease: Secondary | ICD-10-CM

## 2015-01-28 DIAGNOSIS — I10 Essential (primary) hypertension: Secondary | ICD-10-CM | POA: Diagnosis not present

## 2015-01-28 DIAGNOSIS — Z23 Encounter for immunization: Secondary | ICD-10-CM

## 2015-01-28 DIAGNOSIS — F419 Anxiety disorder, unspecified: Principal | ICD-10-CM

## 2015-01-28 DIAGNOSIS — K219 Gastro-esophageal reflux disease without esophagitis: Secondary | ICD-10-CM | POA: Insufficient documentation

## 2015-01-28 DIAGNOSIS — F418 Other specified anxiety disorders: Secondary | ICD-10-CM

## 2015-01-28 DIAGNOSIS — D509 Iron deficiency anemia, unspecified: Secondary | ICD-10-CM

## 2015-01-28 LAB — FERRITIN: FERRITIN: 50 ng/mL (ref 10–291)

## 2015-01-28 LAB — CBC WITH DIFFERENTIAL/PLATELET
BASOS ABS: 0.1 10*3/uL (ref 0.0–0.1)
Basophils Relative: 1 % (ref 0–1)
Eosinophils Absolute: 0.3 10*3/uL (ref 0.0–0.7)
Eosinophils Relative: 4 % (ref 0–5)
HEMATOCRIT: 37 % (ref 36.0–46.0)
HEMOGLOBIN: 11.8 g/dL — AB (ref 12.0–15.0)
LYMPHS PCT: 43 % (ref 12–46)
Lymphs Abs: 3.7 10*3/uL (ref 0.7–4.0)
MCH: 20.8 pg — ABNORMAL LOW (ref 26.0–34.0)
MCHC: 31.9 g/dL (ref 30.0–36.0)
MCV: 65.3 fL — ABNORMAL LOW (ref 78.0–100.0)
MPV: 10.3 fL (ref 8.6–12.4)
Monocytes Absolute: 0.4 10*3/uL (ref 0.1–1.0)
Monocytes Relative: 5 % (ref 3–12)
NEUTROS ABS: 4 10*3/uL (ref 1.7–7.7)
NEUTROS PCT: 47 % (ref 43–77)
Platelets: 483 10*3/uL — ABNORMAL HIGH (ref 150–400)
RBC: 5.67 MIL/uL — AB (ref 3.87–5.11)
RDW: 17 % — ABNORMAL HIGH (ref 11.5–15.5)
WBC: 8.5 10*3/uL (ref 4.0–10.5)

## 2015-01-28 LAB — POCT GLYCOSYLATED HEMOGLOBIN (HGB A1C): Hemoglobin A1C: 5.7

## 2015-01-28 LAB — TSH: TSH: 0.418 u[IU]/mL (ref 0.350–4.500)

## 2015-01-28 MED ORDER — SERTRALINE HCL 100 MG PO TABS: 100.0000 mg | ORAL_TABLET | Freq: Every day | ORAL | Status: DC

## 2015-01-28 MED ORDER — OMEPRAZOLE 20 MG PO CPDR: 20.0000 mg | DELAYED_RELEASE_CAPSULE | Freq: Every day | ORAL | Status: DC

## 2015-01-28 NOTE — Assessment & Plan Note (Addendum)
Present on chart review since 2008. Thought to be related to iron deficiency Hx of normal colonoscopy and due for one again.  Denies any melena or hematochezia  Partial hysterectomy that was done in 1989 - CBC with differential and ferritin today - referral placed to GI

## 2015-01-28 NOTE — Assessment & Plan Note (Signed)
Hydrochlorothiazide is been stopped due to orthostatic symptoms Controlled with amlodipine - Continue

## 2015-01-28 NOTE — Progress Notes (Signed)
Subjective:    [name] [name] - 64 y.o. female MRN [id number]  Date of birth: 112/04/1950  HPI  [name] [name] is here for multiple issues.  HTN Disease Monitoring: Home BP Monitoring:  Medications:amlodipine  Chest pain- no     Dyspnea- no Compliance-  yes.  Lightheadedness-  No. Switched from HCTZ as [name] [name] was having orthostasis    Edema- no  Presenting Issue: depression   Report of symptoms: having mood swings. [name] [name] lost her brother and is cognicent of having to hold her tongue. [name] [name] doesn't like having to do this.   Duration of CURRENT symptoms: at least 4 years.  Age of onset of first mood disturbance: ~64 yo  Impact on function: feels like [name] [name] is not living for herself.   Psychiatric History - Diagnoses: MDD, questionable dx of Bipolar  - Hospitalizations:  none - Pharmacotherapy: zoloft, hx of celexa, elavil, and prozac and lamictal and olanzapine  - Outpatient therapy: [facility] in her 59's   Family history of psychiatric issues: mother with depression, brother with depression    Current and history of substance use: none   HLD:  Hasn't been on a statin  Lipid panel indicates a mod to high intensity  Reports a well rounded diet  Has lost some weight  [name] [name] does 2 mile walks and exercise videos.   Microcytic anemia Partial hysterectomy in 1989  Microcytic anemia that's been persistent since 2008 History of taking iron pills but is no longer taking them History of normal colonoscopy    GERD:  [name] [name] is not taking a PPI  Was previously on a PPI but taken off Only occurs after [name] [name] eats.    Health Maintenance: Health Maintenance Due  Topic Date Due  . FOOT EXAM  04/24/1959  . OPHTHALMOLOGY EXAM  04/24/1959  . URINE MICROALBUMIN  04/24/1959  . DEXA SCAN  04/23/2014  . PNA vac Low Risk Adult (1 of 2 - PCV13) 04/23/2014  . TETANUS/TDAP  06/29/2014  . INFLUENZA VACCINE  10/29/2014  . HEMOGLOBIN A1C  11/23/2014    -  reports that [name] [name] quit smoking  about 10 years ago. [name] [name] has never used smokeless tobacco. - Review of Systems: Per HPI. - Past Medical History: Patient Active Problem List   Diagnosis Date Noted  . GERD (gastroesophageal reflux disease) 01/28/2015  . Microcytic anemia 01/28/2015  . Near syncope 11/09/2014  . Vitamin D deficiency 11/15/2013  . Prediabetes 03/01/2012  . Anxiety and depression 05/31/2009  . Mitral valve disorder 04/15/2009  . CARPAL TUNNEL SYNDROME 01/26/2008  . OBESITY 01/04/2008  . HYPERCHOLESTEROLEMIA 06/24/2006  . Essential hypertension 06/24/2006   - Medications: reviewed and updated Current Outpatient Prescriptions  Medication Sig Dispense Refill  . amLODipine (NORVASC) 5 MG tablet Take 1 tablet (5 mg total) by mouth daily. 30 tablet 6  . aspirin 81 MG tablet Take 81 mg by mouth daily.    . cyclobenzaprine (FLEXERIL) 10 MG tablet TAKE 1 TABLET BY MOUTH AT BEDTIME FOR BACK PAIN AND AS SLEEP AID 30 tablet 1  . fluticasone (FLONASE) 50 MCG/ACT nasal spray Place 2 sprays into the nose daily. spray 1-2 sprays into both nostrils once a day for allergies 16 g 3  . omeprazole (PRILOSEC) 20 MG capsule Take 1 capsule (20 mg total) by mouth daily. 30 capsule 3  . sertraline (ZOLOFT) 100 MG tablet Take 1 tablet (100 mg total) by mouth daily. 30 tablet 3  . traZODone (DESYREL) 50 MG tablet Take 0.5 tablets (  Subjective:    [name] [name] - 64 y.o. female MRN [id number]  Date of birth: 102/02/1951  HPI  [name] [name] is here for multiple issues.  HTN Disease Monitoring: Home BP Monitoring:  Medications:amlodipine  Chest pain- no     Dyspnea- no Compliance-  yes.  Lightheadedness-  No. Switched from HCTZ as [name] [name] was having orthostasis    Edema- no  Presenting Issue: depression   Report of symptoms: having mood swings. [name] [name] lost her brother and is cognicent of having to hold her tongue. [name] [name] doesn't like having to do this.   Duration of CURRENT symptoms: at least 4 years.  Age of onset of first mood disturbance: ~64 yo  Impact on function: feels like [name] [name] is not living for herself.   Psychiatric History - Diagnoses: MDD, questionable dx of Bipolar  - Hospitalizations:  none - Pharmacotherapy: zoloft, hx of celexa, elavil, and prozac and lamictal and olanzapine  - Outpatient therapy: [facility] in her 59's   Family history of psychiatric issues: mother with depression, brother with depression    Current and history of substance use: none   HLD:  Hasn't been on a statin  Lipid panel indicates a mod to high intensity  Reports a well rounded diet  Has lost some weight  [name] [name] does 2 mile walks and exercise videos.   Microcytic anemia Partial hysterectomy in 1989  Microcytic anemia that's been persistent since 2008 History of taking iron pills but is no longer taking them History of normal colonoscopy    GERD:  [name] [name] is not taking a PPI  Was previously on a PPI but taken off Only occurs after [name] [name] eats.    Health Maintenance: Health Maintenance Due  Topic Date Due  . FOOT EXAM  04/24/1959  . OPHTHALMOLOGY EXAM  04/24/1959  . URINE MICROALBUMIN  04/24/1959  . DEXA SCAN  04/23/2014  . PNA vac Low Risk Adult (1 of 2 - PCV13) 04/23/2014  . TETANUS/TDAP  06/29/2014  . INFLUENZA VACCINE  10/29/2014  . HEMOGLOBIN A1C  11/23/2014    -  reports that [name] [name] quit smoking  about 10 years ago. [name] [name] has never used smokeless tobacco. - Review of Systems: Per HPI. - Past Medical History: Patient Active Problem List   Diagnosis Date Noted  . GERD (gastroesophageal reflux disease) 01/28/2015  . Microcytic anemia 01/28/2015  . Near syncope 11/09/2014  . Vitamin D deficiency 11/15/2013  . Prediabetes 03/01/2012  . Anxiety and depression 05/31/2009  . Mitral valve disorder 04/15/2009  . CARPAL TUNNEL SYNDROME 01/26/2008  . OBESITY 01/04/2008  . HYPERCHOLESTEROLEMIA 06/24/2006  . Essential hypertension 06/24/2006   - Medications: reviewed and updated Current Outpatient Prescriptions  Medication Sig Dispense Refill  . amLODipine (NORVASC) 5 MG tablet Take 1 tablet (5 mg total) by mouth daily. 30 tablet 6  . aspirin 81 MG tablet Take 81 mg by mouth daily.    . cyclobenzaprine (FLEXERIL) 10 MG tablet TAKE 1 TABLET BY MOUTH AT BEDTIME FOR BACK PAIN AND AS SLEEP AID 30 tablet 1  . fluticasone (FLONASE) 50 MCG/ACT nasal spray Place 2 sprays into the nose daily. spray 1-2 sprays into both nostrils once a day for allergies 16 g 3  . omeprazole (PRILOSEC) 20 MG capsule Take 1 capsule (20 mg total) by mouth daily. 30 capsule 3  . sertraline (ZOLOFT) 100 MG tablet Take 1 tablet (100 mg total) by mouth daily. 30 tablet 3  . traZODone (DESYREL) 50 MG tablet Take 0.5 tablets (

## 2015-01-28 NOTE — Assessment & Plan Note (Signed)
Moderate high intensity statin indicated She will try to decrease with diet and exercise and retest in 6 months

## 2015-01-28 NOTE — Assessment & Plan Note (Signed)
Symptoms are recurring since she has been off omeprazole - Restarted omeprazole 20 mg daily

## 2015-01-28 NOTE — Assessment & Plan Note (Signed)
Symptoms seem to be exacerbated by the stressors of losing loved ones.  She recently lost her brother Had a visit from an in-home nurse from [facility] that said that she needed to increase or change her medication She's been on multiple medications in the past and would elect not to change - Increase Zoloft 100 mg daily - She will follow-up in 4 weeks - Will need a GAD-7 and PHQ-9 at Follow-Up - Offered integrative behavioral healthcare which she may take up in the future. Hospice grievance counseling should be calling her about the loss of her brother

## 2015-01-28 NOTE — Patient Instructions (Signed)
Thank you for coming in,   I will call you or send a letter with the results from today.    Please bring all of your medications with you to each visit.   Sign up for My Chart to have easy access to your labs results, and communication with your Primary care physician   Please feel free to call with any questions or concerns at any time, at [phone]. --Dr. Raeford Razor

## 2015-01-28 NOTE — Addendum Note (Signed)
Addended by: Katharina Caper, Tyreak Reagle D on: 01/28/2015 05:11 PM   Modules accepted: Orders

## 2015-02-01 ENCOUNTER — Encounter: Payer: Self-pay | Admitting: Internal Medicine

## 2015-02-01 ENCOUNTER — Telehealth: Payer: Self-pay | Admitting: Family Medicine

## 2015-02-01 ENCOUNTER — Encounter: Payer: Self-pay | Admitting: Family Medicine

## 2015-02-01 NOTE — Telephone Encounter (Signed)
Pt returned call but I was unable to speak with her at the time.  Returned called to cell in chart and [phone].  No answer, did LMOVM of cell phone for callback. [name] [name], Salome Spotted

## 2015-02-01 NOTE — Telephone Encounter (Signed)
Left VM for patient. If she calls back please have her speak with a nurse/CMA and inform that I have referred to GI since she is having anemia. I will want to check to see if she needs an EGD or colonoscopy based on her anemia. She will need a screening colonoscopy for colon cancer anway.   If any questions then please take the best time and phone number to call and I will try to call her back.   Rosemarie Ax, MD PGY-3, [facility] 02/01/2015, 10:03 AM

## 2015-02-04 NOTE — Telephone Encounter (Signed)
Pt returned call. I informed her of the information below. Pt has an appt on 04/05/2015 with [facility]. Ottis Stain, CMA

## 2015-02-05 ENCOUNTER — Telehealth: Payer: Self-pay | Admitting: Family Medicine

## 2015-02-05 ENCOUNTER — Other Ambulatory Visit: Payer: Self-pay | Admitting: *Deleted

## 2015-02-05 DIAGNOSIS — R252 Cramp and spasm: Secondary | ICD-10-CM

## 2015-02-05 NOTE — Telephone Encounter (Signed)
Pt called and said that she needs a refill on her Flexeril. jw

## 2015-02-06 MED ORDER — CYCLOBENZAPRINE HCL 10 MG PO TABS: ORAL_TABLET | ORAL | Status: DC

## 2015-03-05 NOTE — Progress Notes (Signed)
      HPI: fu MVP. Carotid Dopplers 2010 showed no significant stenosis. Stress echocardiogram February 2011 normal. Echocardiogram repeated September 2016 and showed normal LV function. Since last seen,   Current Outpatient Prescriptions  Medication Sig Dispense Refill  . amLODipine (NORVASC) 5 MG tablet Take 1 tablet (5 mg total) by mouth daily. 30 tablet 6  . aspirin 81 MG tablet Take 81 mg by mouth daily.    . cyclobenzaprine (FLEXERIL) 10 MG tablet TAKE 1 TABLET BY MOUTH AT BEDTIME FOR BACK PAIN AND AS SLEEP AID 30 tablet 1  . fluticasone (FLONASE) 50 MCG/ACT nasal spray Place 2 sprays into the nose daily. spray 1-2 sprays into both nostrils once a day for allergies 16 g 3  . omeprazole (PRILOSEC) 20 MG capsule Take 1 capsule (20 mg total) by mouth daily. 30 capsule 3  . sertraline (ZOLOFT) 100 MG tablet Take 1 tablet (100 mg total) by mouth daily. 30 tablet 3  . traZODone (DESYREL) 50 MG tablet Take 0.5 tablets (25 mg total) by mouth at bedtime as needed for sleep. 30 tablet 3  . triamcinolone cream (KENALOG) 0.1 % Apply 1 application topically 2 (two) times daily. 30 g 0   No current facility-administered medications for this visit.     Past Medical History  Diagnosis Date  . Hypertension   . Depression   . Hyperlipidemia   . Mitral valve prolapse     Past Surgical History  Procedure Laterality Date  . Partial hysterectomy      Social History   Social History  . Marital Status: Married    Spouse Name: N/A  . Number of Children: 3  . Years of Education: N/A   Occupational History  .      Retired   Social History Main Topics  . Smoking status: Former Smoker    Quit date: 07/30/2004  . Smokeless tobacco: Never Used  . Alcohol Use: 0.0 oz/week    0 Standard drinks or equivalent per week     Comment: Occasional  . Drug Use: No  . Sexual Activity:    Partners: Male   Other Topics Concern  . Not on file   Social History Narrative    ROS: no fevers or  chills, productive cough, hemoptysis, dysphasia, odynophagia, melena, hematochezia, dysuria, hematuria, rash, seizure activity, orthopnea, PND, pedal edema, claudication. Remaining systems are negative.  Physical Exam: Well-developed well-nourished in no acute distress.  Skin is warm and dry.  HEENT is normal.  Neck is supple.  Chest is clear to auscultation with normal expansion.  Cardiovascular exam is regular rate and rhythm.  Abdominal exam nontender or distended. No masses palpated. Extremities show no edema. neuro grossly intact  ECG     This encounter was created in error - please disregard.

## 2015-03-06 ENCOUNTER — Encounter: Payer: Medicare Other | Admitting: Cardiology

## 2015-03-06 DIAGNOSIS — R0989 Other specified symptoms and signs involving the circulatory and respiratory systems: Secondary | ICD-10-CM

## 2015-04-05 ENCOUNTER — Ambulatory Visit: Payer: Medicare Other | Admitting: Internal Medicine

## 2015-04-22 ENCOUNTER — Other Ambulatory Visit: Payer: Self-pay | Admitting: *Deleted

## 2015-04-22 DIAGNOSIS — R252 Cramp and spasm: Secondary | ICD-10-CM

## 2015-04-22 MED ORDER — CYCLOBENZAPRINE HCL 10 MG PO TABS: ORAL_TABLET | ORAL | Status: DC

## 2015-04-24 ENCOUNTER — Ambulatory Visit (INDEPENDENT_AMBULATORY_CARE_PROVIDER_SITE_OTHER): Payer: Medicare Other | Admitting: Family Medicine

## 2015-04-24 ENCOUNTER — Encounter: Payer: Self-pay | Admitting: Family Medicine

## 2015-04-24 VITALS — BP 128/72 | HR 69 | Ht 62.0 in | Wt 165.0 lb

## 2015-04-24 DIAGNOSIS — M25511 Pain in right shoulder: Secondary | ICD-10-CM | POA: Diagnosis not present

## 2015-04-24 MED ORDER — METHYLPREDNISOLONE ACETATE 40 MG/ML IJ SUSP
40.0000 mg | Freq: Once | INTRAMUSCULAR | Status: AC
  Administered 2015-04-24: 40 mg via INTRA_ARTICULAR

## 2015-04-24 NOTE — Patient Instructions (Signed)
You have rotator cuff impingement Try to avoid painful activities (overhead activities, lifting with extended arm) as much as possible. Aleve 2 tabs twice a day with food OR ibuprofen 3 tabs three times a day with food for pain and inflammation. Can take tylenol in addition to this. Subacromial injection may be beneficial to help with pain and to decrease inflammation - you were given this today. Consider physical therapy with transition to home exercise program. Do home exercise program with theraband and scapular stabilization exercises daily - these are very important for long term relief even if an injection was given.  3 sets of 10 once a day. If not improving at follow-up we will consider imaging, physical therapy, and/or nitro patches. Follow up with me in 5-6 weeks.

## 2015-04-25 DIAGNOSIS — M25511 Pain in right shoulder: Secondary | ICD-10-CM | POA: Insufficient documentation

## 2015-04-25 NOTE — Progress Notes (Signed)
PCP: Clearance Coots, MD  Subjective:   HPI: Patient is a 65 y.o. female here for right shoulder pain.  Patient reports for the past week she has had lateral right shoulder pain. No known injury or trauma. States pain is achy. Upper arm feels tight. Pain level 4/10. Some radiation into fingers. Worse with overhead motions. No skin changes, fever, other complaints.  Past Medical History  Diagnosis Date  . Hypertension   . Depression   . Hyperlipidemia   . Mitral valve prolapse   . Microcytic anemia     Current Outpatient Prescriptions on File Prior to Visit  Medication Sig Dispense Refill  . amLODipine (NORVASC) 5 MG tablet Take 1 tablet (5 mg total) by mouth daily. 30 tablet 6  . aspirin 81 MG tablet Take 81 mg by mouth daily.    . cyclobenzaprine (FLEXERIL) 10 MG tablet TAKE 1 TABLET BY MOUTH AT BEDTIME FOR BACK PAIN AND AS SLEEP AID 30 tablet 1  . fluticasone (FLONASE) 50 MCG/ACT nasal spray Place 2 sprays into the nose daily. spray 1-2 sprays into both nostrils once a day for allergies 16 g 3  . omeprazole (PRILOSEC) 20 MG capsule Take 1 capsule (20 mg total) by mouth daily. 30 capsule 3  . sertraline (ZOLOFT) 100 MG tablet Take 1 tablet (100 mg total) by mouth daily. 30 tablet 3  . traZODone (DESYREL) 50 MG tablet Take 0.5 tablets (25 mg total) by mouth at bedtime as needed for sleep. 30 tablet 3  . triamcinolone cream (KENALOG) 0.1 % Apply 1 application topically 2 (two) times daily. 30 g 0   No current facility-administered medications on file prior to visit.    Past Surgical History  Procedure Laterality Date  . Partial hysterectomy      No Known Allergies  Social History   Social History  . Marital Status: Married    Spouse Name: N/A  . Number of Children: 3  . Years of Education: N/A   Occupational History  .      Retired   Social History Main Topics  . Smoking status: Former Smoker    Quit date: 07/30/2004  . Smokeless tobacco: Never Used  .  Alcohol Use: 0.0 oz/week    0 Standard drinks or equivalent per week     Comment: Occasional  . Drug Use: No  . Sexual Activity:    Partners: Male   Other Topics Concern  . Not on file   Social History Narrative    Family History  Problem Relation Age of Onset  . Heart disease Mother     CHF    BP 128/72 mmHg  Pulse 69  Ht 5\' 2"  (1.575 m)  Wt 165 lb (74.844 kg)  BMI 30.17 kg/m2  Review of Systems: See HPI above.    Objective:  Physical Exam:  Gen: NAD  Right shoulder: No swelling, ecchymoses.  No gross deformity. No TTP. FROM with painful arc. Positive Hawkins, Neers. Negative Speeds, Yergasons. Strength 5/5 with empty can and resisted internal/external rotation.  Pain empty can. Negative apprehension. NV intact distally.  Left shoulder: FROM without pain.    Assessment & Plan:  1. Right shoulder pain - 2/2 rotator cuff impingement.  Shown home exercises to do daily. NSAIDs as needed.  Avoid overhead motions, reaching.  Subacromial injection given today.  F/u in 5-6 weeks for reevaluation.  Consider imaging, PT, nitro patches if not improving.  After informed written consent, patient was seated on exam table. Right  shoulder was prepped with alcohol swab and utilizing posterior approach, patient's right subacromial space was injected with 3:1 marcaine: depomedrol. Patient tolerated the procedure well without immediate complications.

## 2015-04-25 NOTE — Assessment & Plan Note (Signed)
2/2 rotator cuff impingement.  Shown home exercises to do daily. NSAIDs as needed.  Avoid overhead motions, reaching.  Subacromial injection given today.  F/u in 5-6 weeks for reevaluation.  Consider imaging, PT, nitro patches if not improving.  After informed written consent, patient was seated on exam table. Right shoulder was prepped with alcohol swab and utilizing posterior approach, patient's right subacromial space was injected with 3:1 marcaine: depomedrol. Patient tolerated the procedure well without immediate complications.

## 2015-05-08 ENCOUNTER — Other Ambulatory Visit: Payer: Self-pay | Admitting: *Deleted

## 2015-05-08 DIAGNOSIS — G4700 Insomnia, unspecified: Secondary | ICD-10-CM

## 2015-05-08 MED ORDER — TRAZODONE HCL 50 MG PO TABS: 25.0000 mg | ORAL_TABLET | Freq: Every evening | ORAL | Status: DC | PRN

## 2015-05-29 ENCOUNTER — Ambulatory Visit: Payer: Medicare Other | Admitting: Family Medicine

## 2015-05-29 ENCOUNTER — Ambulatory Visit (INDEPENDENT_AMBULATORY_CARE_PROVIDER_SITE_OTHER): Payer: Medicare Other | Admitting: Family Medicine

## 2015-05-29 ENCOUNTER — Encounter: Payer: Self-pay | Admitting: Family Medicine

## 2015-05-29 VITALS — BP 120/66 | HR 64 | Temp 98.5°F | Ht 62.0 in | Wt 168.6 lb

## 2015-05-29 DIAGNOSIS — J069 Acute upper respiratory infection, unspecified: Secondary | ICD-10-CM | POA: Insufficient documentation

## 2015-05-29 DIAGNOSIS — E785 Hyperlipidemia, unspecified: Secondary | ICD-10-CM | POA: Diagnosis not present

## 2015-05-29 DIAGNOSIS — E7800 Pure hypercholesterolemia, unspecified: Secondary | ICD-10-CM

## 2015-05-29 MED ORDER — DEXTROMETHORPHAN HBR 15 MG PO CAPS: 15.0000 mg | ORAL_CAPSULE | ORAL | Status: DC

## 2015-05-29 NOTE — Assessment & Plan Note (Signed)
Symptoms most consistent with URI.  Possible for flu  - advised supportive care at this point  - sent in dextromethorphan

## 2015-05-29 NOTE — Patient Instructions (Signed)
Thank you for coming in,   I have sent in medication for your cough.   Please keep using honey and tea.   You can try chloraseptic spray for you throat.   If you symptoms don't improve after 14 days then please return.   Please bring all of your medications with you to each visit.   Sign up for My Chart to have easy access to your labs results, and communication with your Primary care physician   Please feel free to call with any questions or concerns at any time, at [phone]. --Dr. Raeford Razor

## 2015-05-29 NOTE — Assessment & Plan Note (Signed)
Not taking medication - lipid panel today  - she previously tried diet and exercise  - most likely will need to go ahead with statin therapy,.

## 2015-05-29 NOTE — Progress Notes (Signed)
Subjective:    [name] [name] - 65 y.o. female MRN [id number]  Date of birth: [birth year]  CC uri   HPI  [name] [name] is here for uri.   URI  Has been sick for 5 days. Starting getting worse last night.  Having coughing.  Having some congestion and no runny nose.  Medications tried: zicam Seems like it is getting worse.  Sick contacts: yes  Tried hot onion tea and was sweating really bad Monday night.   Symptoms Fever: no Headache or face pain: yes  Tooth pain: no Sneezing: no Scratchy throat: yes Allergies: season Muscle aches: yes Severe fatigue: no Stiff neck: no Shortness of breath: no Rash: no Sore throat or swollen glands: no  HYPERLIPIDEMIA Symptoms Chest pain on exertion:  no   . Leg claudication:   no Medications:none  Compliance- yes  Right upper quadrant pain- no   Muscle aches- no     Component Value Date/Time   CHOL 196 05/25/2014 0922   TRIG 96 05/25/2014 0922   HDL 53 05/25/2014 0922   LDLDIRECT 129* 01/01/2010 2219   VLDL 19 05/25/2014 0922   CHOLHDL 3.7 05/25/2014 6283    SH: former smoker  PMH: HTN, HLD, obesity   Health Maintenance:  Health Maintenance Due  Topic Date Due  . FOOT EXAM  04/24/1959  . OPHTHALMOLOGY EXAM  04/24/1959  . URINE MICROALBUMIN  04/24/1959  . DEXA SCAN  04/23/2014    Review of Systems See HPI     Objective:   Physical Exam BP 120/66 mmHg  Pulse 64  Temp(Src) 98.5 F (36.9 C) (Oral)  Ht 5\' 2"  (1.575 m)  Wt 168 lb 9.6 oz (76.476 kg)  BMI 30.83 kg/m2 Gen: NAD, alert, cooperative with exam, well-appearing HEENT: boggy and dull turbinates bilaterally, TM's clear and intact b/l, uvula midline, no tonsillar exudates, no cervical LAD, MMM  CV: RRR, good S1/S2, no murmur,   Resp: CTABL, no wheezes, non-labored, no crackles.  Skin: no rashes, normal turgor  Neuro: no gross deficits.      Assessment & Plan:   URI (upper respiratory infection) Symptoms most consistent with URI.    Possible for flu  - advised supportive care at this point  - sent in dextromethorphan  HYPERCHOLESTEROLEMIA Not taking medication - lipid panel today  - she previously tried diet and exercise  - most likely will need to go ahead with statin therapy,.

## 2015-05-30 ENCOUNTER — Encounter: Payer: Self-pay | Admitting: Family Medicine

## 2015-05-30 LAB — LIPID PANEL
Cholesterol: 200 mg/dL (ref 125–200)
HDL: 64 mg/dL (ref >=46)
LDL CALC: 119 mg/dL (ref <130)
Total CHOL/HDL Ratio: 3.1 Ratio (ref <=5.0)
Triglycerides: 84 mg/dL (ref <150)
VLDL: 17 mg/dL (ref <30)

## 2015-06-05 ENCOUNTER — Ambulatory Visit: Payer: Medicare Other | Admitting: Family Medicine

## 2015-06-10 ENCOUNTER — Other Ambulatory Visit: Payer: Self-pay | Admitting: *Deleted

## 2015-06-10 MED ORDER — SERTRALINE HCL 100 MG PO TABS: 100.0000 mg | ORAL_TABLET | Freq: Every day | ORAL | Status: DC

## 2015-06-12 ENCOUNTER — Ambulatory Visit: Payer: Medicare Other | Admitting: Family Medicine

## 2015-06-12 ENCOUNTER — Ambulatory Visit (INDEPENDENT_AMBULATORY_CARE_PROVIDER_SITE_OTHER): Payer: Medicare Other | Admitting: Family Medicine

## 2015-06-12 ENCOUNTER — Encounter: Payer: Self-pay | Admitting: Family Medicine

## 2015-06-12 VITALS — BP 153/81 | HR 60 | Ht 62.0 in | Wt 167.0 lb

## 2015-06-12 DIAGNOSIS — M25511 Pain in right shoulder: Secondary | ICD-10-CM

## 2015-06-12 MED ORDER — METHYLPREDNISOLONE ACETATE 40 MG/ML IJ SUSP
40.0000 mg | Freq: Once | INTRAMUSCULAR | Status: AC
  Administered 2015-06-12: 20 mg via INTRA_ARTICULAR

## 2015-06-12 NOTE — Patient Instructions (Signed)
Your rotator cuff impingement has resolved now.  You have AC arthritis as your primary issue in this shoulder. Try to avoid painful activities (overhead activities, lifting with extended arm) as much as possible. I would avoid the exercise with the band where you lift out in front and out to the side. Avoid pushups, overhead presses. Icing 15 minutes at a time 3-4 times a day. Aleve 2 tabs twice a day with food OR ibuprofen 3 tabs three times a day with food for pain and inflammation as needed. Can take tylenol in addition to this. Consider an AC joint injection - we can do this today if you would like. Physical therapy is unlikely to help with this. Follow up with me as needed otherwise.

## 2015-06-17 NOTE — Assessment & Plan Note (Signed)
2/2 rotator cuff impingement initially.  Improving with home exercises, subacromial injection.  Now pain is at [facility] joint consistent with DJD - confirmed with ultrasound.  Discussed options and she wanted to go ahead with injection here which was done today.  Icing, nsaids as needed.  F/u prn.   After informed written consent, patient was seated on exam table. Right AC joint was identified with ultrasound, prepped with alcohol swab and injected with 0.5:0.78mL marcaine: depomedrol. Patient tolerated the procedure well without immediate complications.

## 2015-06-17 NOTE — Progress Notes (Signed)
PCP: Clearance Coots, MD  Subjective:   HPI: Patient is a 65 y.o. female here for right shoulder pain.  1/25: Patient reports for the past week she has had lateral right shoulder pain. No known injury or trauma. States pain is achy. Upper arm feels tight. Pain level 4/10. Some radiation into fingers. Worse with overhead motions. No skin changes, fever, other complaints.  3/15: Patient reports her pain has improved and only comes and goes now superiorly. Pain level 2/10, more dull Some tenderness where she got the injection. Can get a catching here with overhead motions superiorly. No skin changes, fever.  Past Medical History  Diagnosis Date  . Hypertension   . Depression   . Hyperlipidemia   . Mitral valve prolapse   . Microcytic anemia     Current Outpatient Prescriptions on File Prior to Visit  Medication Sig Dispense Refill  . amLODipine (NORVASC) 5 MG tablet Take 1 tablet (5 mg total) by mouth daily. 30 tablet 6  . aspirin 81 MG tablet Take 81 mg by mouth daily.    . cyclobenzaprine (FLEXERIL) 10 MG tablet TAKE 1 TABLET BY MOUTH AT BEDTIME FOR BACK PAIN AND AS SLEEP AID 30 tablet 1  . Dextromethorphan HBr 15 MG CAPS Take 1 capsule (15 mg total) by mouth every 4 (four) hours. 28 each 0  . fluticasone (FLONASE) 50 MCG/ACT nasal spray Place 2 sprays into the nose daily. spray 1-2 sprays into both nostrils once a day for allergies 16 g 3  . omeprazole (PRILOSEC) 20 MG capsule Take 1 capsule (20 mg total) by mouth daily. 30 capsule 3  . sertraline (ZOLOFT) 100 MG tablet Take 1 tablet (100 mg total) by mouth daily. 30 tablet 3  . traZODone (DESYREL) 50 MG tablet Take 0.5 tablets (25 mg total) by mouth at bedtime as needed for sleep. 30 tablet 3  . triamcinolone cream (KENALOG) 0.1 % Apply 1 application topically 2 (two) times daily. 30 g 0   No current facility-administered medications on file prior to visit.    Past Surgical History  Procedure Laterality Date  .  Partial hysterectomy      No Known Allergies  Social History   Social History  . Marital Status: Married    Spouse Name: N/A  . Number of Children: 3  . Years of Education: N/A   Occupational History  .      Retired   Social History Main Topics  . Smoking status: Former Smoker    Quit date: 07/30/2004  . Smokeless tobacco: Never Used  . Alcohol Use: 0.0 oz/week    0 Standard drinks or equivalent per week     Comment: Occasional  . Drug Use: No  . Sexual Activity:    Partners: Male   Other Topics Concern  . Not on file   Social History Narrative    Family History  Problem Relation Age of Onset  . Heart disease Mother     CHF    BP 153/81 mmHg  Pulse 60  Ht 5\' 2"  (1.575 m)  Wt 167 lb (75.751 kg)  BMI 30.54 kg/m2  Review of Systems: See HPI above.    Objective:  Physical Exam:  Gen: NAD  Right shoulder: No swelling, ecchymoses.  No gross deformity. TTP AC joint. FROM with minimal painful arc. Positive Hawkins, negative Neers. Negative Speeds, Yergasons. Strength 5/5 with empty can and resisted internal/external rotation. Negative apprehension. NV intact distally.  Left shoulder: FROM without pain.  Assessment & Plan:  1. Right shoulder pain - 2/2 rotator cuff impingement initially.  Improving with home exercises, subacromial injection.  Now pain is at [facility] joint consistent with DJD - confirmed with ultrasound.  Discussed options and she wanted to go ahead with injection here which was done today.  Icing, nsaids as needed.  F/u prn.   After informed written consent, patient was seated on exam table. Right AC joint was identified with ultrasound, prepped with alcohol swab and injected with 0.5:0.88mL marcaine: depomedrol. Patient tolerated the procedure well without immediate complications.

## 2015-07-08 ENCOUNTER — Other Ambulatory Visit: Payer: Self-pay | Admitting: *Deleted

## 2015-07-08 DIAGNOSIS — K219 Gastro-esophageal reflux disease without esophagitis: Secondary | ICD-10-CM

## 2015-07-09 MED ORDER — OMEPRAZOLE 20 MG PO CPDR: 20.0000 mg | DELAYED_RELEASE_CAPSULE | Freq: Every day | ORAL | Status: DC

## 2015-07-11 ENCOUNTER — Encounter: Payer: Self-pay | Admitting: Family Medicine

## 2015-07-11 ENCOUNTER — Ambulatory Visit (INDEPENDENT_AMBULATORY_CARE_PROVIDER_SITE_OTHER): Payer: Medicare Other | Admitting: Family Medicine

## 2015-07-11 VITALS — BP 123/57 | HR 73 | Temp 98.3°F | Ht 62.0 in | Wt 173.0 lb

## 2015-07-11 DIAGNOSIS — M545 Low back pain, unspecified: Secondary | ICD-10-CM | POA: Insufficient documentation

## 2015-07-11 DIAGNOSIS — R7303 Prediabetes: Secondary | ICD-10-CM

## 2015-07-11 LAB — POCT GLYCOSYLATED HEMOGLOBIN (HGB A1C): HEMOGLOBIN A1C: 5.7

## 2015-07-11 MED ORDER — NAPROXEN 500 MG PO TABS: 500.0000 mg | ORAL_TABLET | Freq: Two times a day (BID) | ORAL | Status: DC

## 2015-07-11 NOTE — Assessment & Plan Note (Signed)
Has not taken medicine for a couple of years but her blood sugars have been running elevated the past few days. She has felt fatigued as the reason why she started checking her sugars again. - A1c today - If it is elevated then we may need to start her back on metformin

## 2015-07-11 NOTE — Progress Notes (Signed)
Subjective:    [name] [name] - 65 y.o. female MRN [id number]  Date of birth: 08-25-1950  CC back pain   HPI  [name] [name] is here for back pain .  Low back/Hip Pain:  Feels the pain in her right upper hip/lower back.  Has never had problems like this before.  Denies any injury.  Takes tramadol and only when she is going to bed.  Started about a week ago It seems like it is getting worse  Now it is 2/10.  Pain was worse when she finished her job  Denies having to go up stairs  Not worse with any specific movement.  Pain is worse when she stops to sit down and then has to start moving again.  The pain seems to catch her  Imaging in 2007 of thoracic spine showing Sclerosis at the left T6 level costovertebral junction, which is of unclear clinical significance Lumbar spine from 2006 was normal  Left hip x-ray 2001 showing mild degenerative changes.   Prediabetes:  History of A1c 6.5 History of taking metformin and hasn't taken it for a few years  Diet and exercise hasn't changed.  No new medications Has felt more sleepy than usual so started check sugars.  Blood sugar 309 at 11 am today  Yesterday 269 at 12:30 Tuesday night 278  Health Maintenance:  Health Maintenance Due  Topic Date Due  . FOOT EXAM  04/24/1959  . OPHTHALMOLOGY EXAM  04/24/1959  . URINE MICROALBUMIN  04/24/1959  . COLONOSCOPY  04/24/1999  . DEXA SCAN  04/23/2014    Review of Systems See HPI     Objective:   Physical Exam BP 123/57 mmHg  Pulse 73  Temp(Src) 98.3 F (36.8 C) (Oral)  Ht 5\' 2"  (1.575 m)  Wt 173 lb (78.472 kg)  BMI 31.63 kg/m2 Gen: NAD, alert, cooperative with exam,  CV: RRR, good S1/S2, no murmur, no edema,  Resp: CTABL, no wheezes, non-labored Skin: no rashes, normal turgor  Neuro: no gross deficits.  Back:  Appearance: No obvious defect Palpation: No tenderness to palpation over the spinal processes Flexion: Normal Extension: Normal Hip:  Appearance: No  obvious defect Palpation: Mild tenderness to palpation of the greater trochanter on the right side Rotation Reduced: Normal internal and sternal FADIR: Normal bilaterally FABER: Right side with exacerbation of pain, left side with mild exacerbation of pain Neuro: Strength hip flexion 5/5, hip abduction 4.5/5 on the right side, hip adduction 4.5/5,  knee extension 5/5, knee flexion 5/5, dorsiflexion 5/5, plantar flexion 5/5 Reflexes: patella 2/2 Bilateral  Achilles 2/2 Bilateral Straight Leg Raise: negative Sensation to light touch intact: yes Neurovascularly intact   Assessment & Plan:   Low back pain Appears the pain is more in her back than in her hip  There is a component of SI joint dysfunction as well as gluteus medius weakness She had negative straight leg test bilaterally Review of imaging doesn't suggest that she has severe degenerative changes - Provided home exercises - Try naproxen 500 mg twice a day for 2 weeks - Encourage ice after exercises - If there is no improvement after 4 weeks then we will try some formal physical therapy  Prediabetes Has not taken medicine for a couple of years but her blood sugars have been running elevated the past few days. She has felt fatigued as the reason why she started checking her sugars again. - A1c today - If it is elevated then we may need to  start her back on metformin

## 2015-07-11 NOTE — Assessment & Plan Note (Signed)
Appears the pain is more in her back than in her hip  There is a component of SI joint dysfunction as well as gluteus medius weakness She had negative straight leg test bilaterally Review of imaging doesn't suggest that she has severe degenerative changes - Provided home exercises - Try naproxen 500 mg twice a day for 2 weeks - Encourage ice after exercises - If there is no improvement after 4 weeks then we will try some formal physical therapy

## 2015-07-11 NOTE — Patient Instructions (Signed)
Thank you for coming in,   Please try the exercises at home and use the naproxen.   Please use ice after you do the exercises.   If there is no improvement in 4 weeks then we can try some formal PT.   Please bring all of your medications with you to each visit.   Sign up for My Chart to have easy access to your labs results, and communication with your Primary care physician   Please feel free to call with any questions or concerns at any time, at [phone]. --Dr. Raeford Razor

## 2015-08-05 ENCOUNTER — Other Ambulatory Visit: Payer: Self-pay | Admitting: Cardiology

## 2015-08-05 NOTE — Telephone Encounter (Signed)
Rx Refill

## 2015-11-22 ENCOUNTER — Ambulatory Visit: Payer: Medicare Other | Admitting: Family Medicine

## 2015-11-22 ENCOUNTER — Ambulatory Visit (HOSPITAL_COMMUNITY)
Admission: RE | Admit: 2015-11-22 | Discharge: 2015-11-22 | Disposition: A | Discharge status: Home / Self Care | Payer: Medicare Other | Source: Ambulatory Visit | Attending: Family Medicine | Admitting: Family Medicine

## 2015-11-22 ENCOUNTER — Ambulatory Visit (HOSPITAL_COMMUNITY): Admission: RE | Admit: 2015-11-22 | Payer: Medicare Other | Source: Ambulatory Visit

## 2015-11-22 ENCOUNTER — Encounter: Payer: Self-pay | Admitting: Internal Medicine

## 2015-11-22 ENCOUNTER — Ambulatory Visit (INDEPENDENT_AMBULATORY_CARE_PROVIDER_SITE_OTHER): Payer: Medicare Other | Admitting: Internal Medicine

## 2015-11-22 VITALS — BP 136/65 | HR 57 | Temp 98.5°F | Wt 174.0 lb

## 2015-11-22 DIAGNOSIS — M542 Cervicalgia: Secondary | ICD-10-CM | POA: Diagnosis not present

## 2015-11-22 DIAGNOSIS — R079 Chest pain, unspecified: Secondary | ICD-10-CM | POA: Diagnosis not present

## 2015-11-22 DIAGNOSIS — R001 Bradycardia, unspecified: Secondary | ICD-10-CM | POA: Diagnosis not present

## 2015-11-22 DIAGNOSIS — R55 Syncope and collapse: Secondary | ICD-10-CM

## 2015-11-22 DIAGNOSIS — R7303 Prediabetes: Secondary | ICD-10-CM

## 2015-11-22 DIAGNOSIS — R252 Cramp and spasm: Secondary | ICD-10-CM | POA: Diagnosis not present

## 2015-11-22 LAB — COMPLETE METABOLIC PANEL WITH GFR
ALT: 13 U/L (ref 6–29)
AST: 13 U/L (ref 10–35)
Albumin: 4 g/dL (ref 3.6–5.1)
Alkaline Phosphatase: 59 U/L (ref 33–130)
BUN: 12 mg/dL (ref 7–25)
CALCIUM: 9.5 mg/dL (ref 8.6–10.4)
CHLORIDE: 105 mmol/L (ref 98–110)
CO2: 24 mmol/L (ref 20–31)
CREATININE: 0.83 mg/dL (ref 0.50–0.99)
GFR, Est African American: 85 mL/min (ref >=60)
GFR, Est Non African American: 74 mL/min (ref >=60)
Glucose, Bld: 89 mg/dL (ref 65–99)
Potassium: 4.2 mmol/L (ref 3.5–5.3)
Sodium: 141 mmol/L (ref 135–146)
Total Bilirubin: 0.8 mg/dL (ref 0.2–1.2)
Total Protein: 6.8 g/dL (ref 6.1–8.1)

## 2015-11-22 LAB — POCT GLYCOSYLATED HEMOGLOBIN (HGB A1C): Hemoglobin A1C: 5.9

## 2015-11-22 LAB — CK: Total CK: 112 U/L (ref 7–177)

## 2015-11-22 MED ORDER — CYCLOBENZAPRINE HCL 10 MG PO TABS: ORAL_TABLET | ORAL | 1 refills | Status: DC

## 2015-11-22 MED ORDER — NAPROXEN 500 MG PO TABS: 500.0000 mg | ORAL_TABLET | Freq: Two times a day (BID) | ORAL | 0 refills | Status: DC

## 2015-11-22 NOTE — Patient Instructions (Signed)
Ms. Fripp,  For your neck pain, x-ray of your cervical spine will be helpful. At your convenience, please go to [facility] for this. I will call you with results.  For cramping, we'll check electrolytes and look for muscle breakdown product today. I will call you with those lab results.  Please make a follow-up appointment in a couple of weeks.  You will get a call about the cardiology referral.  Best, Dr. Ola Spurr

## 2015-11-22 NOTE — Progress Notes (Signed)
[facility] Progress Note  Subjective:  [name] [name] is a 65-y/o female who presents to [facility] for concern of pinched nerve in her neck due to intermittent finger numbness and neck pain. Also concerned about leg cramping and notes episode of chest pressure last week.  Neck pain: - Began over the weekend. Thinks she can feel her neck pop. Feels a little sore.  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  - Has had intermittent episodes of fingers tingling this past week.  - Is worried about a pinched nerve.  ROS: No extremity weakness, no fevers  Leg cramps: - Occurring since July - Is not on a statin (though ASCVD risk is 10.8%) - Denies increased activity - Says she drinks plenty of water - Is worried about her potassium - No new medications ROS: No dark urine  Chest pressure: - Had episode while driving Tuesday that lasted about 2 minutes. Pressure was in central chest. - Does not recall pain elsewhere or diaphoresis - Was not in a stressful situation when this occurred  - Recovered without intervention or pulling over - Has history of reflux but this felt different - Also notes 2 dizzy spells in the past week that occurred inside her home (in air conditioning) and not in the context of getting up - Would like cardiology referral, which had been suggested in the past for dizzy spells, but patient never went ROS: No current chest pain, no SOB  Social: Former smoker  Objective: Blood pressure 136/65, pulse (!) 57, temperature 98.5 F (36.9 C), temperature source Oral, weight 174 lb (78.9 kg). Constitutional: Well-appearing female, in NAD Cardiovascular: RRR, S1, S2, no m/r/g.  Pulmonary/Chest: Effort normal and breath sounds normal. No respiratory distress.  Abdominal: Soft. +BS, soft, NT, ND, no rebound or guarding.  Musculoskeletal: Negative Spurling's test. Point tenderness over mid and lower cervical spine. No pain to palpation of  surround neck muscles. Full range of motion of neck without discomfort. No chest wall TTP. No pain with squeezing of thighs or legs.  Neurological: CNII-XII intact. Grip strength intact bilaterally. No numbness of extremities. No gait abnormalities.  Skin: Skin is warm and dry. No rash noted. No erythema.  Psychiatric: Normal mood and affect.  Vitals reviewed  Assessment/Plan: Neck pain - Will order xray of cervical spine complete given point tenderness over cervical spine and possible radicular symptoms - Will refill naproxen  Leg cramps - Will check BMP and CK today. - Upon further chart review, patient has history of vitamin D deficiency. Would check vitamin D level at next visit if symptoms still occurring; is also due for DEXA scan.  - Would benefit from starting statin but would be preferable to start while not actively having leg cramps of unknown cause  Chest pain - No pain at present. EKG obtained and was normal except for mild bradycardia.  - Will refer to cardiology given history of near syncopal events.  Follow-up in about 2 weeks to discuss any continued symptoms and pre-diabetes.  Olene Floss, MD [place], PGY-2

## 2015-11-23 DIAGNOSIS — M542 Cervicalgia: Secondary | ICD-10-CM | POA: Insufficient documentation

## 2015-11-23 NOTE — Assessment & Plan Note (Addendum)
-   Will order xray of cervical spine complete given point tenderness over cervical spine and possible radicular symptoms - Will refill naproxen

## 2015-11-23 NOTE — Assessment & Plan Note (Signed)
-   Will check BMP and CK today. - Upon further chart review, patient has history of vitamin D deficiency. Would check vitamin D level at next visit if symptoms still occurring; is also due for DEXA scan.  - Would benefit from starting statin but would be preferable to start while not actively having leg cramps of unknown cause

## 2015-11-23 NOTE — Assessment & Plan Note (Signed)
-   No pain at present. EKG obtained and was normal except for mild bradycardia.  - Will refer to cardiology given history of near syncopal events.

## 2015-11-25 ENCOUNTER — Encounter: Payer: Self-pay | Admitting: Internal Medicine

## 2016-01-02 ENCOUNTER — Ambulatory Visit (INDEPENDENT_AMBULATORY_CARE_PROVIDER_SITE_OTHER): Payer: Medicare Other | Admitting: *Deleted

## 2016-01-02 DIAGNOSIS — Z23 Encounter for immunization: Secondary | ICD-10-CM | POA: Diagnosis not present

## 2016-01-06 ENCOUNTER — Telehealth: Payer: Self-pay | Admitting: *Deleted

## 2016-01-06 NOTE — Telephone Encounter (Signed)
Scheduled appt for AWV 01/15/16 at 1:30 PM DUCATTE, Orvis Brill, RN

## 2016-01-15 ENCOUNTER — Ambulatory Visit: Payer: Medicare Other | Admitting: *Deleted

## 2016-02-19 ENCOUNTER — Encounter: Payer: Self-pay | Admitting: *Deleted

## 2016-02-19 ENCOUNTER — Ambulatory Visit (INDEPENDENT_AMBULATORY_CARE_PROVIDER_SITE_OTHER): Payer: Medicare Other | Admitting: *Deleted

## 2016-02-19 VITALS — BP 126/62 | HR 62 | Temp 98.3°F | Ht 62.0 in | Wt 173.8 lb

## 2016-02-19 DIAGNOSIS — Z0000 Encounter for general adult medical examination without abnormal findings: Secondary | ICD-10-CM

## 2016-02-19 NOTE — Patient Instructions (Signed)
Fall Prevention in the Home Introduction Falls can cause injuries. They can happen to people of all ages. There are many things you can do to make your home safe and to help prevent falls. What can I do on the outside of my home?  Regularly fix the edges of walkways and driveways and fix any cracks.  Remove anything that might make you trip as you walk through a door, such as a raised step or threshold.  Trim any bushes or trees on the path to your home.  Use bright outdoor lighting.  Clear any walking paths of anything that might make someone trip, such as rocks or tools.  Regularly check to see if handrails are loose or broken. Make sure that both sides of any steps have handrails.  Any raised decks and porches should have guardrails on the edges.  Have any leaves, snow, or ice cleared regularly.  Use sand or salt on walking paths during winter.  Clean up any spills in your garage right away. This includes oil or grease spills. What can I do in the bathroom?  Use night lights.  Install grab bars by the toilet and in the tub and shower. Do not use towel bars as grab bars.  Use non-skid mats or decals in the tub or shower.  If you need to sit down in the shower, use a plastic, non-slip stool.  Keep the floor dry. Clean up any water that spills on the floor as soon as it happens.  Remove soap buildup in the tub or shower regularly.  Attach bath mats securely with double-sided non-slip rug tape.  Do not have throw rugs and other things on the floor that can make you trip. What can I do in the bedroom?  Use night lights.  Make sure that you have a light by your bed that is easy to reach.  Do not use any sheets or blankets that are too big for your bed. They should not hang down onto the floor.  Have a firm chair that has side arms. You can use this for support while you get dressed.  Do not have throw rugs and other things on the floor that can make you trip. What  can I do in the kitchen?  Clean up any spills right away.  Avoid walking on wet floors.  Keep items that you use a lot in easy-to-reach places.  If you need to reach something above you, use a strong step stool that has a grab bar.  Keep electrical cords out of the way.  Do not use floor polish or wax that makes floors slippery. If you must use wax, use non-skid floor wax.  Do not have throw rugs and other things on the floor that can make you trip. What can I do with my stairs?  Do not leave any items on the stairs.  Make sure that there are handrails on both sides of the stairs and use them. Fix handrails that are broken or loose. Make sure that handrails are as long as the stairways.  Check any carpeting to make sure that it is firmly attached to the stairs. Fix any carpet that is loose or worn.  Avoid having throw rugs at the top or bottom of the stairs. If you do have throw rugs, attach them to the floor with carpet tape.  Make sure that you have a light switch at the top of the stairs and the bottom of the stairs. If  you do not have them, ask someone to add them for you. What else can I do to help prevent falls?  Wear shoes that:  Do not have high heels.  Have rubber bottoms.  Are comfortable and fit you well.  Are closed at the toe. Do not wear sandals.  If you use a stepladder:  Make sure that it is fully opened. Do not climb a closed stepladder.  Make sure that both sides of the stepladder are locked into place.  Ask someone to hold it for you, if possible.  Clearly mark and make sure that you can see:  Any grab bars or handrails.  First and last steps.  Where the edge of each step is.  Use tools that help you move around (mobility aids) if they are needed. These include:  Canes.  Walkers.  Scooters.  Crutches.  Turn on the lights when you go into a dark area. Replace any light bulbs as soon as they burn out.  Set up your furniture so you  have a clear path. Avoid moving your furniture around.  If any of your floors are uneven, fix them.  If there are any pets around you, be aware of where they are.  Review your medicines with your doctor. Some medicines can make you feel dizzy. This can increase your chance of falling. Ask your doctor what other things that you can do to help prevent falls. This information is not intended to replace advice given to you by your health care provider. Make sure you discuss any questions you have with your health care provider. Document Released: 01/10/2009 Document Revised: 08/22/2015 Document Reviewed: 04/20/2014  2017 Elsevier  Health Maintenance, Female Introduction Adopting a healthy lifestyle and getting preventive care can go a long way to promote health and wellness. Talk with your health care provider about what schedule of regular examinations is right for you. This is a good chance for you to check in with your provider about disease prevention and staying healthy. In between checkups, there are plenty of things you can do on your own. Experts have done a lot of research about which lifestyle changes and preventive measures are most likely to keep you healthy. Ask your health care provider for more information. Weight and diet Eat a healthy diet  Be sure to include plenty of vegetables, fruits, low-fat dairy products, and lean protein.  Do not eat a lot of foods high in solid fats, added sugars, or salt.  Get regular exercise. This is one of the most important things you can do for your health.  Most adults should exercise for at least 150 minutes each week. The exercise should increase your heart rate and make you sweat (moderate-intensity exercise).  Most adults should also do strengthening exercises at least twice a week. This is in addition to the moderate-intensity exercise. Maintain a healthy weight  Body mass index (BMI) is a measurement that can be used to identify  possible weight problems. It estimates body fat based on height and weight. Your health care provider can help determine your BMI and help you achieve or maintain a healthy weight.  For females 21 years of age and older:  A BMI below 18.5 is considered underweight.  A BMI of 18.5 to 24.9 is normal.  A BMI of 25 to 29.9 is considered overweight.  A BMI of 30 and above is considered obese. Watch levels of cholesterol and blood lipids  You should start having your blood tested  for lipids and cholesterol at 66 years of age, then have this test every 5 years.  You may need to have your cholesterol levels checked more often if:  Your lipid or cholesterol levels are high.  You are older than 66 years of age.  You are at high risk for heart disease. Cancer screening Lung Cancer  Lung cancer screening is recommended for adults 25-63 years old who are at high risk for lung cancer because of a history of smoking.  A yearly low-dose CT scan of the lungs is recommended for people who:  Currently smoke.  Have quit within the past 15 years.  Have at least a 30-pack-year history of smoking. A pack year is smoking an average of one pack of cigarettes a day for 1 year.  Yearly screening should continue until it has been 15 years since you quit.  Yearly screening should stop if you develop a health problem that would prevent you from having lung cancer treatment. Breast Cancer  Practice breast self-awareness. This means understanding how your breasts normally appear and feel.  It also means doing regular breast self-exams. Let your health care provider know about any changes, no matter how small.  If you are in your 20s or 30s, you should have a clinical breast exam (CBE) by a health care provider every 1-3 years as part of a regular health exam.  If you are 10 or older, have a CBE every year. Also consider having a breast X-ray (mammogram) every year.  If you have a family history of  breast cancer, talk to your health care provider about genetic screening.  If you are at high risk for breast cancer, talk to your health care provider about having an MRI and a mammogram every year.  Breast cancer gene (BRCA) assessment is recommended for women who have family members with BRCA-related cancers. BRCA-related cancers include:  Breast.  Ovarian.  Tubal.  Peritoneal cancers.  Results of the assessment will determine the need for genetic counseling and BRCA1 and BRCA2 testing. Cervical Cancer  Your health care provider may recommend that you be screened regularly for cancer of the pelvic organs (ovaries, uterus, and vagina). This screening involves a pelvic examination, including checking for microscopic changes to the surface of your cervix (Pap test). You may be encouraged to have this screening done every 3 years, beginning at age 45.  For women ages 73-65, health care providers may recommend pelvic exams and Pap testing every 3 years, or they may recommend the Pap and pelvic exam, combined with testing for human papilloma virus (HPV), every 5 years. Some types of HPV increase your risk of cervical cancer. Testing for HPV may also be done on women of any age with unclear Pap test results.  Other health care providers may not recommend any screening for nonpregnant women who are considered low risk for pelvic cancer and who do not have symptoms. Ask your health care provider if a screening pelvic exam is right for you.  If you have had past treatment for cervical cancer or a condition that could lead to cancer, you need Pap tests and screening for cancer for at least 20 years after your treatment. If Pap tests have been discontinued, your risk factors (such as having a new sexual partner) need to be reassessed to determine if screening should resume. Some women have medical problems that increase the chance of getting cervical cancer. In these cases, your health care provider may  recommend more frequent  screening and Pap tests. Colorectal Cancer  This type of cancer can be detected and often prevented.  Routine colorectal cancer screening usually begins at 66 years of age and continues through 66 years of age.  Your health care provider may recommend screening at an earlier age if you have risk factors for colon cancer.  Your health care provider may also recommend using home test kits to check for hidden blood in the stool.  A small camera at the end of a tube can be used to examine your colon directly (sigmoidoscopy or colonoscopy). This is done to check for the earliest forms of colorectal cancer.  Routine screening usually begins at age 50.  Direct examination of the colon should be repeated every 5-10 years through 66 years of age. However, you may need to be screened more often if early forms of precancerous polyps or small growths are found. Skin Cancer  Check your skin from head to toe regularly.  Tell your health care provider about any new moles or changes in moles, especially if there is a change in a mole's shape or color.  Also tell your health care provider if you have a mole that is larger than the size of a pencil eraser.  Always use sunscreen. Apply sunscreen liberally and repeatedly throughout the day.  Protect yourself by wearing long sleeves, pants, a wide-brimmed hat, and sunglasses whenever you are outside. Heart disease, diabetes, and high blood pressure  High blood pressure causes heart disease and increases the risk of stroke. High blood pressure is more likely to develop in:  People who have blood pressure in the high end of the normal range (130-139/85-89 mm Hg).  People who are overweight or obese.  People who are African American.  If you are 18-39 years of age, have your blood pressure checked every 3-5 years. If you are 40 years of age or older, have your blood pressure checked every year. You should have your blood pressure  measured twice-once when you are at a hospital or clinic, and once when you are not at a hospital or clinic. Record the average of the two measurements. To check your blood pressure when you are not at a hospital or clinic, you can use:  An automated blood pressure machine at a pharmacy.  A home blood pressure monitor.  If you are between 55 years and 79 years old, ask your health care provider if you should take aspirin to prevent strokes.  Have regular diabetes screenings. This involves taking a blood sample to check your fasting blood sugar level.  If you are at a normal weight and have a low risk for diabetes, have this test once every three years after 66 years of age.  If you are overweight and have a high risk for diabetes, consider being tested at a younger age or more often. Preventing infection Hepatitis B  If you have a higher risk for hepatitis B, you should be screened for this virus. You are considered at high risk for hepatitis B if:  You were born in a country where hepatitis B is common. Ask your health care provider which countries are considered high risk.  Your parents were born in a high-risk country, and you have not been immunized against hepatitis B (hepatitis B vaccine).  You have HIV or AIDS.  You use needles to inject street drugs.  You live with someone who has hepatitis B.  You have had sex with someone who has hepatitis B.    You get hemodialysis treatment.  You take certain medicines for conditions, including cancer, organ transplantation, and autoimmune conditions. Hepatitis C  Blood testing is recommended for:  Everyone born from [birth year] through 1966.  Anyone with known risk factors for hepatitis C. Sexually transmitted infections (STIs)  You should be screened for sexually transmitted infections (STIs) including gonorrhea and chlamydia if:  You are sexually active and are younger than 66 years of age.  You are older than 66 years of age and  your health care provider tells you that you are at risk for this type of infection.  Your sexual activity has changed since you were last screened and you are at an increased risk for chlamydia or gonorrhea. Ask your health care provider if you are at risk.  If you do not have HIV, but are at risk, it may be recommended that you take a prescription medicine daily to prevent HIV infection. This is called pre-exposure prophylaxis (PrEP). You are considered at risk if:  You are sexually active and do not regularly use condoms or know the HIV status of your partner(s).  You take drugs by injection.  You are sexually active with a partner who has HIV. Talk with your health care provider about whether you are at high risk of being infected with HIV. If you choose to begin PrEP, you should first be tested for HIV. You should then be tested every 3 months for as long as you are taking PrEP. Pregnancy  If you are premenopausal and you may become pregnant, ask your health care provider about preconception counseling.  If you may become pregnant, take 400 to 800 micrograms (mcg) of folic acid every day.  If you want to prevent pregnancy, talk to your health care provider about birth control (contraception). Osteoporosis and menopause  Osteoporosis is a disease in which the bones lose minerals and strength with aging. This can result in serious bone fractures. Your risk for osteoporosis can be identified using a bone density scan.  If you are 5 years of age or older, or if you are at risk for osteoporosis and fractures, ask your health care provider if you should be screened.  Ask your health care provider whether you should take a calcium or vitamin D supplement to lower your risk for osteoporosis.  Menopause may have certain physical symptoms and risks.  Hormone replacement therapy may reduce some of these symptoms and risks. Talk to your health care provider about whether hormone replacement  therapy is right for you. Follow these instructions at home:  Schedule regular health, dental, and eye exams.  Stay current with your immunizations.  Do not use any tobacco products including cigarettes, chewing tobacco, or electronic cigarettes.  If you are pregnant, do not drink alcohol.  If you are breastfeeding, limit how much and how often you drink alcohol.  Limit alcohol intake to no more than 1 drink per day for nonpregnant women. One drink equals 12 ounces of beer, 5 ounces of wine, or 1 ounces of hard liquor.  Do not use street drugs.  Do not share needles.  Ask your health care provider for help if you need support or information about quitting drugs.  Tell your health care provider if you often feel depressed.  Tell your health care provider if you have ever been abused or do not feel safe at home. This information is not intended to replace advice given to you by your health care provider. Make sure you discuss any  questions you have with your health care provider. Document Released: 09/29/2010 Document Revised: 08/22/2015 Document Reviewed: 12/18/2014  2017 [employer]vier

## 2016-02-19 NOTE — Progress Notes (Signed)
Subjective:   [name] [name] is a 65 y.o. female who presents for an Initial Medicare Annual Wellness Visit.   Cardiac Risk Factors include: advanced age (>75men, >4 women);dyslipidemia;hypertension;obesity (BMI >30kg/m2);smoking/ tobacco exposure     Objective:    Today's Vitals   02/19/16 1354  BP: 126/62  Pulse: 62  Temp: 98.3 F (36.8 C)  TempSrc: Oral  SpO2: 100%  Weight: 173 lb 12.8 oz (78.8 kg)  Height: 5\' 2"  (1.575 m)   Body mass index is 31.79 kg/m.   Current Medications (verified) Outpatient Encounter Prescriptions as of 02/19/2016  Medication Sig  . amLODipine (NORVASC) 5 MG tablet TAKE 1 TABLET (5 MG TOTAL) BY MOUTH DAILY.  [name] aspirin 81 MG tablet Take 81 mg by mouth daily.  . cyclobenzaprine (FLEXERIL) 10 MG tablet TAKE 1 TABLET BY MOUTH AT BEDTIME FOR BACK PAIN AND AS SLEEP AID  . fluticasone (FLONASE) 50 MCG/ACT nasal spray Place 2 sprays into the nose daily. spray 1-2 sprays into both nostrils once a day for allergies  . naproxen (NAPROSYN) 500 MG tablet Take 1 tablet (500 mg total) by mouth 2 (two) times daily with a meal.  . omeprazole (PRILOSEC) 20 MG capsule Take 1 capsule (20 mg total) by mouth daily.  . sertraline (ZOLOFT) 100 MG tablet Take 1 tablet (100 mg total) by mouth daily.  . traZODone (DESYREL) 50 MG tablet Take 0.5 tablets (25 mg total) by mouth at bedtime as needed for sleep.  [name] triamcinolone cream (KENALOG) 0.1 % Apply 1 application topically 2 (two) times daily.   No facility-administered encounter medications on file as of 02/19/2016.     Allergies (verified) Patient has no known allergies.   History: Past Medical History:  Diagnosis Date  . Depression   . Hyperlipidemia   . Hypertension   . Microcytic anemia   . Mitral valve prolapse    Past Surgical History:  Procedure Laterality Date  . PARTIAL HYSTERECTOMY     Family History  Problem Relation Age of Onset  . Heart disease Mother     CHF  . Diabetes Mother    . Hypertension Mother   . Cancer Father   . Hypertension Sister   . Pancreatitis Sister   . Dementia Sister    Social History   Occupational History  .      Retired   Social History Main Topics  . Smoking status: Former Smoker    Packs/day: 0.50    Years: 20.00    Quit date: 07/30/2004  . Smokeless tobacco: Never Used  . Alcohol use 0.0 oz/week     Comment: Occasional  . Drug use: No  . Sexual activity: Yes    Partners: Male    Tobacco Counseling Counseling given: Yes Patient is former smoker with no plans to restart   Activities of Daily Living In your present state of health, do you have any difficulty performing the following activities: 02/19/2016  Hearing? N  Vision? N  Difficulty concentrating or making decisions? Y  Walking or climbing stairs? N  Dressing or bathing? N  Doing errands, shopping? N  Preparing Food and eating ? N  Using the Toilet? N  In the past six months, have you accidently leaked urine? Y  Do you have problems with loss of bowel control? N  Managing your Medications? N  Managing your Finances? N  Housekeeping or managing your Housekeeping? N  Some recent data might be hidden  Home Safety:  My  home has a working smoke alarm:  Yes X 2           My home throw rugs have been fastened down to the floor or removed:  Fastened down I have non-slip mats in the bathtub and shower:  Yes         All my home's stairs have railings or bannisters: One level home with 3 outside stairs with no railings. Discussed having railings installed especially as fall/ winter season approching.           My home's floors, stairs and hallways are free from clutter, wires and cords:  Yes     I wear seatbelts consistently:  Yes    Immunizations and Health Maintenance Immunization History  Administered Date(s) Administered  . Influenza Split 02/04/2011, 12/15/2011  . Influenza Whole 01/04/2008, 01/26/2014  . Influenza,inj,Quad PF,36+ Mos 01/28/2015, 01/02/2016    . Pneumococcal Conjugate-13 01/28/2015  . Td 06/28/2004  . Tdap 01/28/2015   Health Maintenance Due  Topic Date Due  . FOOT EXAM  04/24/1959  . OPHTHALMOLOGY EXAM  04/24/1959  . URINE MICROALBUMIN  04/24/1959  . COLONOSCOPY  04/24/1999  . DEXA SCAN  04/23/2014  . MAMMOGRAM  02/14/2016  . PNA vac Low Risk Adult (2 of 2 - PPSV23) 01/28/2016  Last Hgb A1C 5.9 Diabetic measures not done. Patient states she had a colonoscopy within the last 5 years at [facility] Patient states she had a recent eye exam at Dr. Princess Perna office Patient will contact the [facility] to schedule Dexa Scan and mammogram. Contact info given.  Patient Care Team: Carlyle Dolly, MD as PCP - General (Family Medicine) Galvin Proffer, OD (Optometry)  Indicate any recent Medical Services you may have received from other than [facility] providers in the past year (date may be approximate).     Assessment:   This is a routine wellness examination for Cornie.   Hearing/Vision screen  Hearing Screening   Method: Audiometry   125Hz  250Hz  500Hz  1000Hz  2000Hz  3000Hz  4000Hz  6000Hz  8000Hz   Right ear:   40 40 40  40    Left ear:   40 40 40  Fail      Dietary issues and exercise activities discussed: Current Exercise Habits: The patient has a physically strenous job, but has no regular exercise apart from work., Exercise limited by: None identified  Goals    . Exercise 150 minutes per week (moderate activity)    . Weight (lb) < 165 lb (74.8 kg) (pt-stated)     Discussed recording consumption intake to assist with desired 7% weight loss. Recommended MyPLate or My Fitness Pal apps to assist with recording. Discussed engaging in physical activity 150 min/week Patient's husband also desires to lose weight and increase physical activity and will be her accountability partner. Depression Screen PHQ 2/9 Scores 02/19/2016 07/11/2015 05/29/2015 01/28/2015 02/27/2014 07/10/2011  PHQ - 2 Score 0 0 0 1 0 3   PHQ- 9 Score - - - - - 9    Fall Risk Fall Risk  02/19/2016 05/29/2015 02/27/2014 06/01/2013  Falls in the past year? No Yes No No  Number falls in past yr: - 1 - -  Injury with Fall? - No - -   TUG Test:  Done in 6 seconds.   Cognitive Function: Mini-Cog  Passed with score 5/5  Screening Tests Health Maintenance  Topic Date Due  . FOOT EXAM  04[birth year]24/1959  . OPHTHALMOLOGY EXAM  04/24/1959  . URINE MICROALBUMIN  04/24/1959  .  COLONOSCOPY  04/24/1999  . DEXA SCAN  04/23/2014  . MAMMOGRAM  02/14/2016  . PNA vac Low Risk Adult (2 of 2 - PPSV23) 01/28/2016  . HEMOGLOBIN A1C  05/24/2016  . TETANUS/TDAP  01/27/2025  . INFLUENZA VACCINE  Completed  . ZOSTAVAX  Addressed  . Hepatitis C Screening  Completed      Plan:   Patient states she had a colonoscopy within the last 5 years at [facility] Patient states she had a recent eye exam at Dr. Princess Perna office Patient will contact the [facility] to schedule Dexa Scan and mammogram; contact info given.   During the course of the visit, Jacklyn was educated and counseled about the following appropriate screening and preventive services:   Vaccines to include Pneumoccal, Influenza, Td, Zostavax  Cardiovascular disease screening  Colorectal cancer screening  Bone density screening  Diabetes screening  Mammography/PAP  Nutrition counseling  Patient Instructions (the written plan) were given to the patient.    Velora Heckler, RN   02/19/2016

## 2016-02-28 NOTE — Progress Notes (Signed)
Addendum: I have reviewed this visit and discussed with Lauren Ducatte, RN, BSN, and agree with her documentation 

## 2016-03-11 ENCOUNTER — Other Ambulatory Visit: Payer: Self-pay | Admitting: *Deleted

## 2016-03-11 MED ORDER — SERTRALINE HCL 100 MG PO TABS: 100.0000 mg | ORAL_TABLET | Freq: Every day | ORAL | 3 refills | Status: DC

## 2016-03-18 ENCOUNTER — Ambulatory Visit: Payer: Medicare Other | Admitting: Family Medicine

## 2016-03-18 ENCOUNTER — Ambulatory Visit (INDEPENDENT_AMBULATORY_CARE_PROVIDER_SITE_OTHER): Payer: Medicare Other | Admitting: Family Medicine

## 2016-03-18 ENCOUNTER — Encounter: Payer: Self-pay | Admitting: Family Medicine

## 2016-03-18 VITALS — BP 140/58 | HR 81 | Temp 98.2°F | Ht 62.0 in | Wt 171.0 lb

## 2016-03-18 DIAGNOSIS — M542 Cervicalgia: Secondary | ICD-10-CM

## 2016-03-18 DIAGNOSIS — K219 Gastro-esophageal reflux disease without esophagitis: Secondary | ICD-10-CM | POA: Diagnosis not present

## 2016-03-18 MED ORDER — OMEPRAZOLE 20 MG PO CPDR: 20.0000 mg | DELAYED_RELEASE_CAPSULE | Freq: Every day | ORAL | 3 refills | Status: DC

## 2016-03-18 MED ORDER — DICLOFENAC SODIUM 75 MG PO TBEC: 75.0000 mg | DELAYED_RELEASE_TABLET | Freq: Two times a day (BID) | ORAL | 0 refills | Status: DC

## 2016-03-18 MED ORDER — KETOROLAC TROMETHAMINE 30 MG/ML IJ SOLN
30.0000 mg | Freq: Once | INTRAMUSCULAR | Status: AC
  Administered 2016-03-18: 30 mg via INTRAMUSCULAR

## 2016-03-18 MED ORDER — CYCLOBENZAPRINE HCL 10 MG PO TABS: ORAL_TABLET | ORAL | 1 refills | Status: DC

## 2016-03-18 NOTE — Patient Instructions (Signed)
Get your neck xray done.  I have sent in medications to help with your pain.  Make sure that you take them with food.  Follow up with your pcp in 2 weeks if no improvement.   Managing pain, stiffness, and swelling  Use a cervical traction device, if told by your doctor.  If told, put heat on the affected area. Do this before exercises (physical therapy) or as often as told by your doctor. Use the heat source that your doctor recommends, such as a moist heat pack or a heating pad.  Place a towel between your skin and the heat source.  Leave the heat on for 20-30 minutes.  Take the heat off (remove the heat) if your skin turns bright red. This is very important if you cannot feel pain, heat, or cold. You may have a greater risk of getting burned.  Put ice on the affected area.  Put ice in a plastic bag.  Place a towel between your skin and the bag.  Leave the ice on for 20 minutes, 2-3 times a day. Activity  Do not drive while wearing a neck collar. If you do not have a neck collar, ask your doctor if it is safe to drive.  Do not drive or use heavy machinery while taking prescription pain medicine or muscle relaxants, unless your doctor approves.  Do not lift anything that is heavier than 10 lb (4.5 kg) until your doctor tells you that it is safe.  Rest as told by your doctor.  Avoid activities that make you feel worse. Ask your doctor what activities are safe for you.  Do exercises as told by your doctor or physical therapist. Preventing neck sprain  Practice good posture. Adjust your workstation to help with this, if needed.  Exercise regularly as told by your doctor or physical therapist.  Avoid activities that are risky or may cause a neck sprain (cervical sprain). General instructions  Take over-the-counter and prescription medicines only as told by your doctor.  Do not use any products that contain nicotine or tobacco. This includes cigarettes and e-cigarettes. If  you need help quitting, ask your doctor.  Keep all follow-up visits as told by your doctor. This is important. Contact a doctor if:  You have pain or other symptoms that get worse.  You have symptoms that do not get better after 2 weeks.  You have pain that does not get better with medicine.  You start to have new, unexplained symptoms.  You have sores or irritated skin from wearing your neck collar. Get help right away if:  You have very bad pain.  You have any of the following in any part of your body:  Loss of feeling (numbness).  Tingling.  Weakness.  You cannot move a part of your body (you have paralysis).  Your activity level does not improve. Summary  A cervical sprain is a stretch or tear in the tissues that connect bones (ligaments) in the neck.  If you have a neck (cervical) collar, do not take off the collar unless your doctor says that this is safe.  Put ice on affected areas as told by your doctor.  Put heat on affected areas as told by your doctor.  Good posture and regular exercise can help prevent a neck sprain from happening again. This information is not intended to replace advice given to you by your health care provider. Make sure you discuss any questions you have with your health care provider.  Document Released: 09/02/2007 Document Revised: 11/26/2015 Document Reviewed: 11/26/2015 Elsevier Interactive Patient Education  2017 [employer].
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
## 2016-03-29 ENCOUNTER — Other Ambulatory Visit: Payer: Self-pay | Admitting: Family Medicine

## 2016-03-29 ENCOUNTER — Other Ambulatory Visit: Payer: Self-pay | Admitting: Cardiology

## 2016-03-29 DIAGNOSIS — M542 Cervicalgia: Secondary | ICD-10-CM

## 2016-04-16 ENCOUNTER — Ambulatory Visit: Payer: Medicare Other | Admitting: Family Medicine

## 2016-04-20 ENCOUNTER — Other Ambulatory Visit: Payer: Self-pay | Admitting: Family Medicine

## 2016-04-20 DIAGNOSIS — K219 Gastro-esophageal reflux disease without esophagitis: Secondary | ICD-10-CM

## 2016-04-22 ENCOUNTER — Ambulatory Visit (INDEPENDENT_AMBULATORY_CARE_PROVIDER_SITE_OTHER): Payer: Medicare Other | Admitting: Family Medicine

## 2016-04-22 ENCOUNTER — Encounter: Payer: Self-pay | Admitting: Family Medicine

## 2016-04-22 VITALS — BP 145/69 | HR 62 | Ht 62.0 in | Wt 168.0 lb

## 2016-04-22 DIAGNOSIS — M25562 Pain in left knee: Secondary | ICD-10-CM | POA: Diagnosis not present

## 2016-04-22 DIAGNOSIS — S39012A Strain of muscle, fascia and tendon of lower back, initial encounter: Secondary | ICD-10-CM

## 2016-04-22 DIAGNOSIS — M79644 Pain in right finger(s): Secondary | ICD-10-CM

## 2016-04-22 MED ORDER — METHOCARBAMOL 500 MG PO TABS: 500.0000 mg | ORAL_TABLET | Freq: Three times a day (TID) | ORAL | 1 refills | Status: DC | PRN

## 2016-04-22 MED ORDER — METHYLPREDNISOLONE ACETATE 40 MG/ML IJ SUSP
40.0000 mg | Freq: Once | INTRAMUSCULAR | Status: AC
  Administered 2016-04-22: 40 mg via INTRA_ARTICULAR

## 2016-04-22 MED ORDER — MELOXICAM 15 MG PO TABS: 15.0000 mg | ORAL_TABLET | Freq: Every day | ORAL | 2 refills | Status: DC

## 2016-04-22 NOTE — Patient Instructions (Addendum)
You have a lumbar strain/spasms of your back. Meloxicam 15mg  daily with food for pain and inflammation. Robaxin as needed for spasms. Heat 15 minutes at a time 3-4 times a day. Pick 2-3 of the home exercises/stretches and do them once or twice a day.  Your finger exam is more consistent with arthritis than a trigger finger today. Splinting to help rest this over next week (as much as possible). Medicines as noted above.  Your knee pain is due to a flare of arthritis. You were given a cortisone shot today.  Follow up with me in 1 week to reevaluate these areas. Finger injection, physical therapy are possibilities for the above if not improving as expected.

## 2016-04-27 DIAGNOSIS — M25562 Pain in left knee: Secondary | ICD-10-CM | POA: Insufficient documentation

## 2016-04-27 DIAGNOSIS — M79644 Pain in right finger(s): Secondary | ICD-10-CM | POA: Insufficient documentation

## 2016-04-27 DIAGNOSIS — S39012A Strain of muscle, fascia and tendon of lower back, initial encounter: Secondary | ICD-10-CM | POA: Insufficient documentation

## 2016-04-27 NOTE — Assessment & Plan Note (Signed)
more consistent with arthritis than a trigger finger.  She opted for splinting to help rest with meloxicam daily.  F/u in 1 week to reevaluate all areas.

## 2016-04-27 NOTE — Progress Notes (Signed)
PCP: Carlyle Dolly, MD  Subjective:   HPI: Patient is a 66 y.o. female here for multiple concerns.  Patient reports for about 1 week she's had right middle finger locking up. Some associated swelling, pain level 5/10 and sharp. Worse with bending PIP joint. Pain worse in right hip for past day though, up to 8/10 and sharp. Stays posterior right side. No radiation into leg. No numbness or tingling.  Not taking any medicine for this. Not using heat or ice. Also with 1 day of medial left knee pain without trauma. Pain 6/10 at times with walking. No skin changes, numbness.  Past Medical History:  Diagnosis Date  . Depression   . Hyperlipidemia   . Hypertension   . Microcytic anemia   . Mitral valve prolapse     Current Outpatient Prescriptions on File Prior to Visit  Medication Sig Dispense Refill  . amLODipine (NORVASC) 5 MG tablet TAKE 1 TABLET (5 MG TOTAL) BY MOUTH DAILY. 30 tablet 2  . aspirin 81 MG tablet Take 81 mg by mouth daily.    . fluticasone (FLONASE) 50 MCG/ACT nasal spray Place 2 sprays into the nose daily. spray 1-2 sprays into both nostrils once a day for allergies 16 g 3  . omeprazole (PRILOSEC) 20 MG capsule TAKE 1 CAPSULE BY MOUTH EVERY DAY 30 capsule 0  . sertraline (ZOLOFT) 100 MG tablet Take 1 tablet (100 mg total) by mouth daily. 30 tablet 3  . traZODone (DESYREL) 50 MG tablet Take 0.5 tablets (25 mg total) by mouth at bedtime as needed for sleep. 30 tablet 3  . triamcinolone cream (KENALOG) 0.1 % Apply 1 application topically 2 (two) times daily. 30 g 0   No current facility-administered medications on file prior to visit.     Past Surgical History:  Procedure Laterality Date  . PARTIAL HYSTERECTOMY      No Known Allergies  Social History   Social History  . Marital status: Married    Spouse name: N/A  . Number of children: 3  . Years of education: N/A   Occupational History  .      Retired   Social History Main Topics  . Smoking  status: Former Smoker    Packs/day: 0.50    Years: 20.00    Quit date: 07/30/2004  . Smokeless tobacco: Never Used  . Alcohol use 0.0 oz/week     Comment: Occasional  . Drug use: No  . Sexual activity: Yes    Partners: Male   Other Topics Concern  . Not on file   Social History Narrative  . No narrative on file    Family History  Problem Relation Age of Onset  . Heart disease Mother     CHF  . Diabetes Mother   . Hypertension Mother   . Cancer Father   . Hypertension Sister   . Pancreatitis Sister   . Dementia Sister     BP (!) 145/69   Pulse 62   Ht 5\' 2"  (1.575 m)   Wt 168 lb (76.2 kg)   BMI 30.73 kg/m   Review of Systems: See HPI above.     Objective:  Physical Exam:  Gen: NAD, comfortable in exam room  Back/right hip: No gross deformity, scoliosis. TTP right lumbar paraspinal region.  No midline or bony TTP. FROM. Strength LEs 5/5 all muscle groups.   2+ MSRs in patellar and achilles tendons, equal bilaterally. Negative SLRs. Sensation intact to light touch bilaterally. Negative logroll  bilateral hips Negative fabers and piriformis stretches.  Left knee: No gross deformity, ecchymoses, swelling. Mild medial > lateral tenderness. FROM. Negative ant/post drawers. Negative valgus/varus testing. Negative lachmanns. Negative mcmurrays, apleys, patellar apprehension. NV intact distally.  Right 3rd digit: No gross deformity, swelling, bruising. TTP PIP joint circumferentially.  No other tenderness. FROM PIP, DIP, MCP joints.  Strength 5/5 all motions. Collateral ligaments intact. NVI distally.   Assessment & Plan:  1. Lumbar strain - shown home exercises and stretches to do daily.  Meloxicam with robaxin as needed.  Heat for spasms.  2. Left knee pain - 2/2 flare of known arthritis.  Injection given today.  Meloxicam daily.  After informed written consent, patient was seated on exam table. Left knee was prepped with alcohol swab and utilizing  anteromedial approach, patient's left knee was injected intraarticularly with 3:1 bupivicaine: depomedrol. Patient tolerated the procedure well without immediate complications.  3. Right 3rd digit pain - more consistent with arthritis than a trigger finger.  She opted for splinting to help rest with meloxicam daily.  F/u in 1 week to reevaluate all areas.

## 2016-04-27 NOTE — Assessment & Plan Note (Signed)
shown home exercises and stretches to do daily.  Meloxicam with robaxin as needed.  Heat for spasms.

## 2016-04-27 NOTE — Assessment & Plan Note (Signed)
2/2 flare of known arthritis.  Injection given today.  Meloxicam daily.  After informed written consent, patient was seated on exam table. Left knee was prepped with alcohol swab and utilizing anteromedial approach, patient's left knee was injected intraarticularly with 3:1 bupivicaine: depomedrol. Patient tolerated the procedure well without immediate complications.

## 2016-04-28 ENCOUNTER — Ambulatory Visit (HOSPITAL_COMMUNITY)
Admission: RE | Admit: 2016-04-28 | Discharge: 2016-04-28 | Disposition: A | Discharge status: Home / Self Care | Payer: Medicare Other | Source: Ambulatory Visit | Attending: Family Medicine | Admitting: Family Medicine

## 2016-04-28 DIAGNOSIS — M542 Cervicalgia: Secondary | ICD-10-CM | POA: Diagnosis not present

## 2016-04-28 DIAGNOSIS — M2578 Osteophyte, vertebrae: Secondary | ICD-10-CM | POA: Insufficient documentation

## 2016-04-29 ENCOUNTER — Ambulatory Visit: Payer: Medicare Other | Admitting: Family Medicine

## 2016-04-30 ENCOUNTER — Telehealth: Payer: Self-pay | Admitting: Internal Medicine

## 2016-04-30 NOTE — Telephone Encounter (Signed)
Spoke with patient about cervical spine x-ray results, which showed degenerative changes. Recommended continuing meloxicam and trying a heating pad. She will call if symptoms worsen.

## 2016-05-06 ENCOUNTER — Ambulatory Visit: Payer: Medicare Other | Admitting: Family Medicine

## 2016-05-13 ENCOUNTER — Ambulatory Visit: Payer: Medicare Other | Admitting: Family Medicine

## 2016-05-19 ENCOUNTER — Other Ambulatory Visit: Payer: Self-pay | Admitting: Family Medicine

## 2016-05-19 DIAGNOSIS — K219 Gastro-esophageal reflux disease without esophagitis: Secondary | ICD-10-CM

## 2016-06-17 ENCOUNTER — Other Ambulatory Visit: Payer: Self-pay | Admitting: Family Medicine

## 2016-06-17 DIAGNOSIS — K219 Gastro-esophageal reflux disease without esophagitis: Secondary | ICD-10-CM

## 2016-07-18 ENCOUNTER — Other Ambulatory Visit: Payer: Self-pay | Admitting: Family Medicine

## 2016-07-19 ENCOUNTER — Other Ambulatory Visit: Payer: Self-pay | Admitting: Family Medicine

## 2016-07-19 DIAGNOSIS — K219 Gastro-esophageal reflux disease without esophagitis: Secondary | ICD-10-CM

## 2016-08-06 ENCOUNTER — Telehealth: Payer: Self-pay | Admitting: Family Medicine

## 2016-08-06 NOTE — Telephone Encounter (Signed)
Appointment scheduled for 09/04/16 at 1:30pm. Patient has not yet seen current PCP; overdue HM for pre-diabetes.

## 2016-08-14 ENCOUNTER — Ambulatory Visit: Payer: Medicare Other | Admitting: Family Medicine

## 2016-08-17 ENCOUNTER — Ambulatory Visit (INDEPENDENT_AMBULATORY_CARE_PROVIDER_SITE_OTHER): Payer: Medicare Other | Admitting: Family Medicine

## 2016-08-17 ENCOUNTER — Encounter: Payer: Self-pay | Admitting: Family Medicine

## 2016-08-17 DIAGNOSIS — M25551 Pain in right hip: Secondary | ICD-10-CM | POA: Diagnosis not present

## 2016-08-17 NOTE — Patient Instructions (Signed)
You have snapping hip syndrome of your hip, less likely an aggravation of mild arthritis. Stretching and strengthening are very important for this. Hold stretches for 20-30 seconds, repeat 3 times. Strengthening exercises 3 sets of 10 once a day to start with - see instructions for how to increase if you're doing well. Naproxen twice a day with food. Topical medication (capsaicin, biofreeze) up to 4 times a day as needed.  Your finger pain is due to arthritis. These are the different medications you can take for this: Tylenol 500mg  1-2 tabs three times a day for pain. Naproxen as you have been. Capsaicin, aspercreme, or biofreeze topically up to four times a day may also help with pain. Some supplements that may help for arthritis: Boswellia extract, curcumin, pycnogenol Cortisone injections are an option but difficult to do for small joints like this. It's important that you continue to stay active. Heat or ice 15 minutes at a time 3-4 times a day as needed to help with pain. Follow up with me in 1 month.

## 2016-08-19 DIAGNOSIS — M25551 Pain in right hip: Secondary | ICD-10-CM | POA: Insufficient documentation

## 2016-08-19 NOTE — Assessment & Plan Note (Signed)
negative logroll and excellent motion point against arthritis and more consistent with snapping hip syndrome, hip flexor strain.  Shown exercises and stretches to do daily for this.  Naproxen twice a day with food.  Topical medication as well.  F/u in 1 month.

## 2016-08-19 NOTE — Progress Notes (Signed)
PCP: Carlyle Dolly, MD  Subjective:   HPI: Patient is a 66 y.o. female here for right hip pain.  Patient reports right hip started hurting on 5/14 without injury. Pain is 2/10, but up to 6/10 and sharp at times. Pain can radiate down to knee anteriorly but starts deep and anterior groin/hip area. Questionable tingling. No numbness or skin changes. Tried naproxen. Worse with hip flexion.  Past Medical History:  Diagnosis Date  . Depression   . Hyperlipidemia   . Hypertension   . Microcytic anemia   . Mitral valve prolapse     Current Outpatient Prescriptions on File Prior to Visit  Medication Sig Dispense Refill  . amLODipine (NORVASC) 5 MG tablet TAKE 1 TABLET (5 MG TOTAL) BY MOUTH DAILY. 30 tablet 2  . aspirin 81 MG tablet Take 81 mg by mouth daily.    . fluticasone (FLONASE) 50 MCG/ACT nasal spray Place 2 sprays into the nose daily. spray 1-2 sprays into both nostrils once a day for allergies 16 g 3  . meloxicam (MOBIC) 15 MG tablet TAKE 1 TABLET (15 MG TOTAL) BY MOUTH DAILY. 30 tablet 2  . methocarbamol (ROBAXIN) 500 MG tablet Take 1 tablet (500 mg total) by mouth every 8 (eight) hours as needed. 60 tablet 1  . omeprazole (PRILOSEC) 20 MG capsule TAKE 1 CAPSULE BY MOUTH EVERY DAY 30 capsule 0  . sertraline (ZOLOFT) 100 MG tablet Take 1 tablet (100 mg total) by mouth daily. 30 tablet 3  . traZODone (DESYREL) 50 MG tablet Take 0.5 tablets (25 mg total) by mouth at bedtime as needed for sleep. 30 tablet 3  . triamcinolone cream (KENALOG) 0.1 % Apply 1 application topically 2 (two) times daily. 30 g 0   No current facility-administered medications on file prior to visit.     Past Surgical History:  Procedure Laterality Date  . PARTIAL HYSTERECTOMY      No Known Allergies  Social History   Social History  . Marital status: Married    Spouse name: N/A  . Number of children: 3  . Years of education: N/A   Occupational History  .      Retired   Social  History Main Topics  . Smoking status: Former Smoker    Packs/day: 0.50    Years: 20.00    Quit date: 07/30/2004  . Smokeless tobacco: Never Used  . Alcohol use 0.0 oz/week     Comment: Occasional  . Drug use: No  . Sexual activity: Yes    Partners: Male   Other Topics Concern  . Not on file   Social History Narrative  . No narrative on file    Family History  Problem Relation Age of Onset  . Heart disease Mother        CHF  . Diabetes Mother   . Hypertension Mother   . Cancer Father   . Hypertension Sister   . Pancreatitis Sister   . Dementia Sister     BP (!) 152/71   Pulse 60   Ht 5\' 2"  (1.575 m)   Wt 170 lb (77.1 kg)   BMI 31.09 kg/m   Review of Systems: See HPI above.     Objective:  Physical Exam:  Gen: NAD, comfortable in exam room  Back/right hip: No gross deformity, scoliosis. No TTP back or hip.  No midline or bony TTP. FROM with pain on hip flexion Strength LEs 5/5 all muscle groups except hip flexion and abduction 4/5.  2+ MSRs in patellar and achilles tendons, equal bilaterally. Negative SLRs. Sensation intact to light touch bilaterally. Negative logroll bilateral hips Negative fabers and piriformis stretches.   Assessment & Plan:  1. Right hip pain - negative logroll and excellent motion point against arthritis and more consistent with snapping hip syndrome, hip flexor strain.  Shown exercises and stretches to do daily for this.  Naproxen twice a day with food.  Topical medication as well.  F/u in 1 month.  She also mentioned right thumb and middle finger pain has started to come back - feels within IP joints but not at A1 pulley areas, no catching A1 pulley location.  We reviewed arthritis instructions.

## 2016-09-04 ENCOUNTER — Encounter: Payer: Self-pay | Admitting: Family Medicine

## 2016-09-04 ENCOUNTER — Ambulatory Visit (INDEPENDENT_AMBULATORY_CARE_PROVIDER_SITE_OTHER): Payer: Medicare Other | Admitting: Family Medicine

## 2016-09-04 VITALS — Temp 98.4°F | Ht 62.0 in | Wt 178.0 lb

## 2016-09-04 DIAGNOSIS — M79672 Pain in left foot: Secondary | ICD-10-CM

## 2016-09-04 DIAGNOSIS — I951 Orthostatic hypotension: Secondary | ICD-10-CM

## 2016-09-04 DIAGNOSIS — R7303 Prediabetes: Secondary | ICD-10-CM

## 2016-09-04 DIAGNOSIS — M79671 Pain in right foot: Secondary | ICD-10-CM

## 2016-09-04 LAB — POCT GLYCOSYLATED HEMOGLOBIN (HGB A1C): HEMOGLOBIN A1C: 5.8

## 2016-09-04 MED ORDER — METHOCARBAMOL 500 MG PO TABS: 500.0000 mg | ORAL_TABLET | Freq: Three times a day (TID) | ORAL | 1 refills | Status: DC | PRN

## 2016-09-04 MED ORDER — MELOXICAM 15 MG PO TABS: 15.0000 mg | ORAL_TABLET | Freq: Every day | ORAL | 0 refills | Status: AC

## 2016-09-04 NOTE — Progress Notes (Signed)
Subjective:    Patient ID: [name] [name] , female   DOB: [birth year] , 66 y.o..   MRN: [id number]  HPI  [name] [name] is here for  Chief Complaint  Patient presents with  . prediabetes    a1c in lab    1. Pre Diabetes: Was previously on metformin but then taken off. Does not take any medication. Trying to eat less, being more cautious about late night snacking. She notes that she is not exercising like she should. She notes that she was having some trouble in her right hip but now it's better. She would like to check her A1C today.   2. Orthostatic Hypotension: She notes that when she goes from a sitting and/or laying position to standing position she sometimes gets lightheaded. Never blacks out of loses consciousness. No visual changes. Denies any chest pain, palpitations, or shortness of breath.   3. Heel Pain: Started a couple weeks ago. She notes that she recently had a change in shoes. She thinks that it may be because her feet were having a hard time adjusting. She has bought the foot inserts but doesn't know if it is helping. The pain isn't worse in the morning. Only hurts when she is walking.   Review of Systems: Per HPI.  Health Maintenance Due  Topic Date Due  . FOOT EXAM  04/24/1959  . OPHTHALMOLOGY EXAM  04/24/1959  . URINE MICROALBUMIN  04/24/1959  . COLONOSCOPY  04/24/1999  . DEXA SCAN  04/23/2014  . PNA vac Low Risk Adult (2 of 2 - PPSV23) 01/28/2016  . MAMMOGRAM  02/14/2016    Past Medical History: Patient Active Problem List   Diagnosis Date Noted  . Heel pain, bilateral 09/07/2016  . Orthostatic hypotension 09/07/2016  . Right hip pain 08/19/2016  . Lumbar strain 04/27/2016  . Left knee pain 04/27/2016  . Finger pain, right 04/27/2016  . Neck pain 11/23/2015  . Low back pain 07/11/2015  . Right shoulder pain 04/25/2015  . GERD (gastroesophageal reflux disease) 01/28/2015  . Microcytic anemia 01/28/2015  . Near syncope 11/09/2014  . Leg cramps  11/15/2013  . Vitamin D deficiency 11/15/2013  . Prediabetes 03/01/2012  . Chest pain 08/18/2011  . Anxiety and depression 05/31/2009  . Mitral valve disorder 04/15/2009  . CARPAL TUNNEL SYNDROME 01/26/2008  . OBESITY 01/04/2008  . HYPERCHOLESTEROLEMIA 06/24/2006  . Essential hypertension 06/24/2006   Medications: reviewed and updated  Social Hx:  reports that she quit smoking about 12 years ago. She has a 10.00 pack-year smoking history. She has never used smokeless tobacco.   Objective:   Temp 98.4 F (36.9 C) (Oral)   Ht 5\' 2"  (1.575 m)   Wt 178 lb (80.7 kg)   BMI 32.56 kg/m   Physical Exam  Gen: NAD, alert, cooperative with exam, well-appearing HEENT: NCAT, PERRL, clear conjunctiva, oropharynx clear, supple neck Cardiac: Regular rate and rhythm, normal S1/S2, no edema, capillary refill brisk  Respiratory: Clear to auscultation bilaterally, no wheezes, non-labored breathing Gastrointestinal: soft, non tender, non distended, bowel sounds present Neurological: no gross deficits.  Psych: good insight, normal mood and affect MSK:  Left Ankle & Foot: No visible swelling, ecchymosis, erythema, ulcers, calluses, blister Arch: Mild pes planus  Toes: no claw or hammer deformity  Tenderness upon palpation on middle of heel No heel tenderness No pain at MT heads No pain at base of 5th MT; No tenderness over cuboid; No tenderness over N spot or navicular prominence No tenderness  on lateral and medial malleolus Full in plantarflexion, dorsiflexion, inversion, and eversion of the foot; flexion and extension of the toes Strength: 5/5 in all directions. Sensation: intact Vascular: intact w/ dorsalis pedis & posterior tibialis pulses 2+ Stable lateral and medial ligaments; Negative Anterior drawer test  Right Ankle & Foot: No visible swelling, ecchymosis, erythema, ulcers, calluses, blister Arch: Mild pes planus  Toes: no claw or hammer deformity  No heel tenderness No pain  at MT heads No pain at base of 5th MT; No tenderness over cuboid; No tenderness over N spot or navicular prominence No tenderness on lateral and medial malleolus Full in plantarflexion, dorsiflexion, inversion, and eversion of the foot; flexion and extension of the toes Strength: 5/5 in all directions. Sensation: intact Vascular: intact w/ dorsalis pedis & posterior tibialis pulses 2+ Stable lateral and medial ligaments; Negative Anterior drawer test  Assessment & Plan:  Prediabetes Hgb A1C is 5.8 today. Not on any medication. Would like to try diet modification to try and lower it. Will follow up in 6 months and recheck A1C, if it is elevated further at that time will discuss starting Metformin.   Heel pain, bilateral Likely plantar fasciitis based on history and physical. Discussed stretching and handout about specific stretches for plantar fasciitis were provided. Will also try Mobic 15 mg daily x 1 week. If no improvement in symptoms after 1-2 weeks, consider referral to sports medicine.   Orthostatic hypotension Positive orthostatic BP today in office with a 20 point decrease in systolic BP from laying to standing. Possibly from overcorrecting blood pressure. BP 122/60 sitting today. Her goal BP is <150/90 so we have plenty of leeway. Will stop Amlodipine and see if symptoms improve. If no resolution, will look into other causes.   Orders Placed This Encounter  Procedures  . POCT glycosylated hemoglobin (Hb A1C)    Anders Simmonds, MD [facility], PGY-2

## 2016-09-04 NOTE — Patient Instructions (Addendum)
Thank you for coming in today, it was so nice to see you! Today we talked about:    Hgb A1C: Your A1c is good today. It is 5.8. Continue trying to exercise and eat plenty of fruits and vegetables to prevent diabetes.   Blood pressure: Stop the amlodipine for now. Check your blood pressure every day. Your goal pressure is less than 150/90. If it is greater than that, please call me. If you continue to have orthostatic hypotension symptoms even after stopping the amlodipine for a week, let me know   Heel pain: You can do the stretches we talked about. Take Meloxicam 1 pill daily for 1 week. If you continue to have pain let me know and we may need to send you to sports medicine   Please follow up in 6 months or sooner if needed. You can schedule this appointment at the front desk before you leave or call the clinic.  Bring in all your medications or supplements to each appointment for review.   If we ordered any tests today, you will be notified via telephone of any abnormalities. If everything is normal you will get a letter in the mail.   If you have any questions or concerns, please do not hesitate to call the office at [phone]. You can also message me directly via MyChart.   Sincerely,  Smitty Cords, MD

## 2016-09-07 DIAGNOSIS — I951 Orthostatic hypotension: Secondary | ICD-10-CM | POA: Insufficient documentation

## 2016-09-07 DIAGNOSIS — M79672 Pain in left foot: Secondary | ICD-10-CM

## 2016-09-07 DIAGNOSIS — M79671 Pain in right foot: Secondary | ICD-10-CM | POA: Insufficient documentation

## 2016-09-07 NOTE — Assessment & Plan Note (Signed)
Hgb A1C is 5.8 today. Not on any medication. Would like to try diet modification to try and lower it. Will follow up in 6 months and recheck A1C, if it is elevated further at that time will discuss starting Metformin.

## 2016-09-07 NOTE — Assessment & Plan Note (Signed)
Likely plantar fasciitis based on history and physical. Discussed stretching and handout about specific stretches for plantar fasciitis were provided. Will also try Mobic 15 mg daily x 1 week. If no improvement in symptoms after 1-2 weeks, consider referral to sports medicine.

## 2016-09-07 NOTE — Assessment & Plan Note (Addendum)
Positive orthostatic BP today in office with a 20 point decrease in systolic BP from laying to standing. Possibly from overcorrecting blood pressure. BP 122/60 sitting today. Her goal BP is <150/90 so we have plenty of leeway. Will stop Amlodipine and see if symptoms improve. If no resolution, will look into other causes.

## 2016-09-08 ENCOUNTER — Other Ambulatory Visit: Payer: Self-pay | Admitting: Family Medicine

## 2016-09-08 NOTE — Telephone Encounter (Signed)
Pt needs refill for zoloft sent to [facility] on [address]. She also needs an Rx for the shingles shot.  Both of these were covered on her last visit on jjune 8

## 2016-09-09 MED ORDER — SERTRALINE HCL 100 MG PO TABS: 100.0000 mg | ORAL_TABLET | Freq: Every day | ORAL | 3 refills | Status: DC

## 2016-09-16 ENCOUNTER — Ambulatory Visit: Payer: Medicare Other | Admitting: Family Medicine

## 2016-11-03 ENCOUNTER — Telehealth: Payer: Self-pay | Admitting: Family Medicine

## 2016-11-03 DIAGNOSIS — Z1211 Encounter for screening for malignant neoplasm of colon: Secondary | ICD-10-CM

## 2016-11-03 NOTE — Telephone Encounter (Signed)
Pt needs referal for colonoscopy. She received an automated call reminding her to schedule one.  She would prefer [facility]./  Please advise

## 2016-11-05 NOTE — Telephone Encounter (Signed)
referall placed

## 2016-11-30 ENCOUNTER — Other Ambulatory Visit: Payer: Self-pay | Admitting: Family Medicine

## 2016-12-07 ENCOUNTER — Encounter: Payer: Self-pay | Admitting: Gastroenterology

## 2016-12-09 ENCOUNTER — Ambulatory Visit (INDEPENDENT_AMBULATORY_CARE_PROVIDER_SITE_OTHER): Payer: Medicare Other | Admitting: *Deleted

## 2016-12-09 VITALS — BP 144/60

## 2016-12-09 DIAGNOSIS — Z0130 Encounter for examination of blood pressure without abnormal findings: Secondary | ICD-10-CM

## 2016-12-09 DIAGNOSIS — Z23 Encounter for immunization: Secondary | ICD-10-CM | POA: Diagnosis not present

## 2016-12-09 NOTE — Progress Notes (Signed)
   Patient in nurse clinic for blood pressure check and immunizations.  Pt stated she was taken off blood pressure medications about 1 month ago. BP taken at home about 1 hour prior to appointment was 159/64.  Nurse took BP 144/60.  Pt denied chest pain, SOB, dizziness, SOB, n/v or numbness/tingling of legs/arms.  Pt reported maybe taken half of tablet with medication left over.  Precept with PCP; patient to continue to monitor blood pressure at home. Take BP once daily and record, pt to call clinic if above 150s. Pt voice understanding.   Verl Blalock presents for immunizations.    Screening questions for immunizations: 1. Are you sick today?  no 2. Do you have allergies to medications, foods, or any vaccines?  no 3. Have you ever had a serious reaction after receiving a vaccination?  no 4. Do you have a long-term health problem with heart disease, asthma, lung disease, kidney disease, metabolic disease (e.g. diabetes), anemia, or other blood disorder?  no 5. Have you had a seizure, brain problem, or other nervous system problem?  no 6. Do you have cancer, leukemia, AIDS, or any other immune system problem?  no 7. Do you take cortisone, prednisone, other steroids, anticancer drugs or have you had radiation treatments?  no 8. Have you received a transfusion of blood or blood products, or been given immune (gamma) globulin or an antiviral drug in the past year?  no 9. Have you received vaccinations in the past 4 weeks?  no 10. FEMALES ONLY: Are you pregnant or is there a chance you could become pregnant during the next month?  no   Derl Barrow, RN

## 2016-12-14 ENCOUNTER — Other Ambulatory Visit: Payer: Self-pay | Admitting: Cardiology

## 2017-01-07 NOTE — Progress Notes (Signed)
 Subjective:    Patient ID: [name] [name] , female   DOB: 02/08/1951 , 66 y.o..   MRN: [id number]  HPI  [name] [name] is a 66 yo F with PMH of joint pains, GERD, orthostatic hypotension, HTN, anxiety and depression, obesity here for  Chief Complaint  Patient presents with  . Panic Attack   1. Chest tightness/fulness: Patient states that for the last couple weeks she has had 1 minute episodes of chest tightness/fullness with shortness of breath, increased heart rate, and dizziness. She notes that she has these episodes daily if not a couple times a day. These episodes occur at rest and not worsened with movement. Has not tried any medication for these episodes. Does have a history of hypertension and possible near syncope and was referred to cardiology back in August but she notes that she never received a call to schedule this appointment. Denies any nausea, vomiting, edema.  Has a hx of anxiety and depression. Takes Zoloft 100 mg about 5 times a week and misses her dose about twice a week. Patient states that she is worried that she may be having a panic attack when the episodes of chest tightness occur but is not sure. 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   Review of Systems: Per HPI.   Past Medical History: Patient Active Problem List   Diagnosis Date Noted  . Feeling of chest tightness 01/08/2017  . Heel pain, bilateral 09/07/2016  . Orthostatic hypotension 09/07/2016  . Right hip pain 08/19/2016  . Lumbar strain 04/27/2016  . Left knee pain 04/27/2016  . Finger pain, right 04/27/2016  . Neck pain 11/23/2015  . Low back pain 07/11/2015  . Right shoulder pain 04/25/2015  . GERD (gastroesophageal reflux disease) 01/28/2015  . Microcytic anemia 01/28/2015  . Near syncope 11/09/2014  . Leg cramps 11/15/2013  . Vitamin D deficiency 11/15/2013  . Prediabetes 03/01/2012  . Chest pain  08/18/2011  . Anxiety and depression 05/31/2009  . Mitral valve disorder 04/15/2009  . CARPAL TUNNEL SYNDROME 01/26/2008  . OBESITY 01/04/2008  . HYPERCHOLESTEROLEMIA 06/24/2006  . Essential hypertension 06/24/2006    Medications: reviewed and updated Current Outpatient Prescriptions  Medication Sig Dispense Refill  . amLODipine (NORVASC) 5 MG tablet Take 1 tablet (5 mg total) by mouth daily. Please schedule appointment for refills 30 tablet 0  . aspirin 81 MG tablet Take 81 mg by mouth daily.    . fluticasone (FLONASE) 50 MCG/ACT nasal spray Place 2 sprays into the nose daily. spray 1-2 sprays into both nostrils once a day for allergies (Patient not taking: Reported on 09/04/2016) 16 g 3  . latanoprost (XALATAN) 0.005 % ophthalmic solution Place 1 drop into both eyes at bedtime.  6  . methocarbamol (ROBAXIN) 500 MG tablet Take 1 tablet (500 mg total) by mouth every 8 (eight) hours as needed. 60 tablet 1  . omeprazole (PRILOSEC) 20 MG capsule TAKE 1 CAPSULE BY MOUTH EVERY DAY 30 capsule 0  . sertraline (ZOLOFT) 100 MG tablet Take 1 tablet (100 mg total) by mouth daily. 30 tablet 3  . triamcinolone cream (KENALOG) 0.1 % Apply 1 application topically 2 (two) times daily. (Patient not taking: Reported on 09/04/2016) 30 g 0   No current facility-administered medications for this visit.     Social Hx:  reports that she quit smoking about 12 years ago. She has    Subjective:    Patient ID: [name] [name] , female   DOB: 02/08/1951 , 66 y.o..   MRN: [id number]  HPI  [name] [name] is a 66 yo F with PMH of joint pains, GERD, orthostatic hypotension, HTN, anxiety and depression, obesity here for  Chief Complaint  Patient presents with  . Panic Attack   1. Chest tightness/fulness: Patient states that for the last couple weeks she has had 1 minute episodes of chest tightness/fullness with shortness of breath, increased heart rate, and dizziness. She notes that she has these episodes daily if not a couple times a day. These episodes occur at rest and not worsened with movement. Has not tried any medication for these episodes. Does have a history of hypertension and possible near syncope and was referred to cardiology back in August but she notes that she never received a call to schedule this appointment. Denies any nausea, vomiting, edema.  Has a hx of anxiety and depression. Takes Zoloft 100 mg about 5 times a week and misses her dose about twice a week. Patient states that she is worried that she may be having a panic attack when the episodes of chest tightness occur but is not sure. 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   Review of Systems: Per HPI.   Past Medical History: Patient Active Problem List   Diagnosis Date Noted  . Feeling of chest tightness 01/08/2017  . Heel pain, bilateral 09/07/2016  . Orthostatic hypotension 09/07/2016  . Right hip pain 08/19/2016  . Lumbar strain 04/27/2016  . Left knee pain 04/27/2016  . Finger pain, right 04/27/2016  . Neck pain 11/23/2015  . Low back pain 07/11/2015  . Right shoulder pain 04/25/2015  . GERD (gastroesophageal reflux disease) 01/28/2015  . Microcytic anemia 01/28/2015  . Near syncope 11/09/2014  . Leg cramps 11/15/2013  . Vitamin D deficiency 11/15/2013  . Prediabetes 03/01/2012  . Chest pain  08/18/2011  . Anxiety and depression 05/31/2009  . Mitral valve disorder 04/15/2009  . CARPAL TUNNEL SYNDROME 01/26/2008  . OBESITY 01/04/2008  . HYPERCHOLESTEROLEMIA 06/24/2006  . Essential hypertension 06/24/2006    Medications: reviewed and updated Current Outpatient Prescriptions  Medication Sig Dispense Refill  . amLODipine (NORVASC) 5 MG tablet Take 1 tablet (5 mg total) by mouth daily. Please schedule appointment for refills 30 tablet 0  . aspirin 81 MG tablet Take 81 mg by mouth daily.    . fluticasone (FLONASE) 50 MCG/ACT nasal spray Place 2 sprays into the nose daily. spray 1-2 sprays into both nostrils once a day for allergies (Patient not taking: Reported on 09/04/2016) 16 g 3  . latanoprost (XALATAN) 0.005 % ophthalmic solution Place 1 drop into both eyes at bedtime.  6  . methocarbamol (ROBAXIN) 500 MG tablet Take 1 tablet (500 mg total) by mouth every 8 (eight) hours as needed. 60 tablet 1  . omeprazole (PRILOSEC) 20 MG capsule TAKE 1 CAPSULE BY MOUTH EVERY DAY 30 capsule 0  . sertraline (ZOLOFT) 100 MG tablet Take 1 tablet (100 mg total) by mouth daily. 30 tablet 3  . triamcinolone cream (KENALOG) 0.1 % Apply 1 application topically 2 (two) times daily. (Patient not taking: Reported on 09/04/2016) 30 g 0   No current facility-administered medications for this visit.     Social Hx:  reports that she quit smoking about 12 years ago. She has

## 2017-01-08 ENCOUNTER — Encounter (INDEPENDENT_AMBULATORY_CARE_PROVIDER_SITE_OTHER): Payer: Self-pay

## 2017-01-08 ENCOUNTER — Ambulatory Visit (INDEPENDENT_AMBULATORY_CARE_PROVIDER_SITE_OTHER): Payer: Medicare Other | Admitting: Family Medicine

## 2017-01-08 ENCOUNTER — Encounter: Payer: Self-pay | Admitting: Psychology

## 2017-01-08 VITALS — BP 140/75 | HR 66 | Temp 98.4°F | Ht 62.0 in | Wt 177.2 lb

## 2017-01-08 DIAGNOSIS — R0602 Shortness of breath: Secondary | ICD-10-CM

## 2017-01-08 DIAGNOSIS — R0789 Other chest pain: Secondary | ICD-10-CM

## 2017-01-08 NOTE — Assessment & Plan Note (Addendum)
Episodes of chest tightness associated with shortness of breath, increased heart rate, dizziness that last ~ 1 minute at rest. Vitals stable today including a normal heart rhythm. History most consistent with panic attacks/anxiety. Other differentials include possible arrhythmia vs angina. Patient states she was referred to cardiology in the past for episodes of near syncope but never received a call. She would like to follow up with cardiology to discuss her symptoms. -Behavioral health consultant Verdis Frederickson met with patient today (please see her separate note) and she will follow up with patient in 2 weeks -Discussed importance taking Zoloft 100 mg daily and to not skip doses  -Checking CBC, CMP, TSH -Placed referral for cardiology -ED precautions discussed -If cardiologic work up negative may consider adjusting psychiatric medications if she continues to be symptomatic

## 2017-01-08 NOTE — Patient Instructions (Addendum)
Thank you for coming in today, it was so nice to see you! Today we talked about:    Episodes of chest tightness/fullness: This may be anxiety. You spoke to our behavioral health consultant Verdis Frederickson today. Please take your Zoloft every day  We will need to make sure your heart is ok. It looks like they referred you to a cardiology heart doctor at [facility]. I have placed another referral for you to get you an appointment. If you don't hear from anyone in the next week, please call me at the clinic and let me know so I can get to the bottom of what's going on  We have gotten some blood work today   Go to the hospital if your chest pain does not go away after 1 minute or your shortness of breath gets worse.   Please follow up on 10/26 @ 2PM with Verdis Frederickson.   If we ordered any tests today, you will be notified via telephone of any abnormalities. If everything is normal you will get a letter in the mail.   If you have any questions or concerns, please do not hesitate to call the office at [phone]. You can also message me directly via MyChart.   Sincerely,  Smitty Cords, MD

## 2017-01-08 NOTE — Progress Notes (Signed)
Dr. Juanito Doom requested [facility] consult.   Presenting Issue: Anxiety, panic attacks and depression.   Report of symptoms:  Patient reports feeling tightness and pressure in chest, increased heart rate and feeling overwhelmed for about 1 minute. This has been happening about once daily for the past two weeks. Patient considered going to the ER on a couple of occasions but decided to follow up with PCP instead. During these episodes she attempts to breathe and sit or lay down, which relieves the symptoms. In discussing potential triggers, patient reports that last Tuesday she lost a patient but she is unsure if the symptoms pre-date this event. Patient discussed her concern that the symptoms are due to a heart condition and is being referred to a cardiologist to rule this out. Patient also reported symptoms of depression, feeling unmotiviated to do things, irritability and lack of concentration. She has been taking Zoloft for several years but misses roughly 2 doses a week. She reports noticing increased irritability when she misses a dose.  [facility] discussed treatment for panic attacks once medical conditions are ruled out.   PHQ-9:  15 per Dr. Juanito Doom, denied item #9, suicidal ideation GAD-7:  15 per Dr. Juanito Doom  Assessment / Plan / Recommendations:  Patient's affect was appropriate during visit but she was tearful on one occasion discussing a recent loss. Patient is concerned about her symptoms and [facility] discussed ruling out medical conditions and patient will follow-up with cardiologist per referral from Dr. Juanito Doom. [facility] discussed panic attack treatment once medical cause is ruled-out and patient is interested in following-up with [facility] to work on that. Patient reported practicing deep breathing is helpful to her and [facility] discussed making this a more regular daily practice, patient will try to do this daily. [facility] asked patient to demonstrate how she engages in deep breathing and she has a good understanding that she  needs to engage her diaphragm, hold her breathe and then breathe out slowly.  [facility] discussed with patient how she can increase her adherence to her Zoloft medication, which she misses roughly 2x week. Patient agreed to change the time she takes it from morning to night. She was uninterested in setting medication alarm as suggested by [facility]. Patient will return to see [facility] in 2 weeks.    Warmhandoff:  complete

## 2017-01-10 LAB — COMPREHENSIVE METABOLIC PANEL
ALBUMIN: 4 g/dL (ref 3.6–4.8)
ALT: 13 IU/L (ref 0–32)
AST: 12 IU/L (ref 0–40)
Albumin/Globulin Ratio: 1.5 (ref 1.2–2.2)
Alkaline Phosphatase: 72 IU/L (ref 39–117)
BUN / CREAT RATIO: 14 (ref 12–28)
BUN: 13 mg/dL (ref 8–27)
Bilirubin Total: 0.4 mg/dL (ref 0.0–1.2)
CO2: 21 mmol/L (ref 20–29)
CREATININE: 0.92 mg/dL (ref 0.57–1.00)
Calcium: 9.5 mg/dL (ref 8.7–10.3)
Chloride: 105 mmol/L (ref 96–106)
GFR, EST AFRICAN AMERICAN: 75 mL/min/{1.73_m2} (ref >59)
GFR, EST NON AFRICAN AMERICAN: 65 mL/min/{1.73_m2} (ref >59)
Globulin, Total: 2.7 g/dL (ref 1.5–4.5)
Glucose: 91 mg/dL (ref 65–99)
POTASSIUM: 4.3 mmol/L (ref 3.5–5.2)
SODIUM: 143 mmol/L (ref 134–144)
TOTAL PROTEIN: 6.7 g/dL (ref 6.0–8.5)

## 2017-01-10 LAB — CBC WITH DIFFERENTIAL/PLATELET
BASOS ABS: 0.1 10*3/uL (ref 0.0–0.2)
Basos: 1 %
EOS (ABSOLUTE): 0.4 10*3/uL (ref 0.0–0.4)
Eos: 5 %
HEMATOCRIT: 34.9 % (ref 34.0–46.6)
HEMOGLOBIN: 10.4 g/dL — AB (ref 11.1–15.9)
Immature Grans (Abs): 0 10*3/uL (ref 0.0–0.1)
Immature Granulocytes: 0 %
LYMPHS ABS: 2.7 10*3/uL (ref 0.7–3.1)
Lymphs: 34 %
MCH: 20.4 pg — AB (ref 26.6–33.0)
MCHC: 29.8 g/dL — AB (ref 31.5–35.7)
MCV: 68 fL — ABNORMAL LOW (ref 79–97)
MONOCYTES: 5 %
MONOS ABS: 0.4 10*3/uL (ref 0.1–0.9)
NEUTROS ABS: 4.3 10*3/uL (ref 1.4–7.0)
Neutrophils: 55 %
Platelets: 377 10*3/uL (ref 150–379)
RBC: 5.1 x10E6/uL (ref 3.77–5.28)
RDW: 18.2 % — ABNORMAL HIGH (ref 12.3–15.4)
WBC: 7.9 10*3/uL (ref 3.4–10.8)

## 2017-01-10 LAB — TSH: TSH: 0.586 u[IU]/mL (ref 0.450–4.500)

## 2017-01-13 ENCOUNTER — Other Ambulatory Visit: Payer: Self-pay | Admitting: Cardiology

## 2017-01-14 ENCOUNTER — Telehealth: Payer: Self-pay

## 2017-01-19 NOTE — Telephone Encounter (Signed)
Error

## 2017-01-20 ENCOUNTER — Telehealth: Payer: Self-pay | Admitting: Family Medicine

## 2017-01-20 MED ORDER — FERROUS SULFATE 325 (65 FE) MG PO TABS: 325.0000 mg | ORAL_TABLET | Freq: Every day | ORAL | 3 refills | Status: DC

## 2017-01-20 NOTE — Telephone Encounter (Signed)
Called patient. Her hemoglobin is low, there are signs of microcytic anemic. Would like for her to restart her iron pills. Refill sent. She was appreciative of the call. All questions answered.   Of note, I placed a referral for colonoscopy and cardiology last visit. She states GI will not do procedure under cleared by cardiology. Cardiology has not called her yet to schedule an appt. Asked her to please let us know if she hasn't heard from cardiology by next week.   Smitty Cords, MD [place], PGY-3

## 2017-01-22 ENCOUNTER — Ambulatory Visit: Payer: Medicare Other

## 2017-01-22 ENCOUNTER — Ambulatory Visit: Payer: Medicare Other | Admitting: Family Medicine

## 2017-01-25 ENCOUNTER — Ambulatory Visit: Payer: Medicare Other | Admitting: Family Medicine

## 2017-02-08 ENCOUNTER — Encounter: Payer: Medicare Other | Admitting: Gastroenterology

## 2017-02-11 ENCOUNTER — Ambulatory Visit: Payer: Medicare Other | Admitting: Physician Assistant

## 2017-02-11 ENCOUNTER — Encounter: Payer: Self-pay | Admitting: Physician Assistant

## 2017-02-11 VITALS — BP 138/68 | HR 58 | Ht 62.0 in | Wt 172.8 lb

## 2017-02-11 DIAGNOSIS — R079 Chest pain, unspecified: Secondary | ICD-10-CM | POA: Diagnosis not present

## 2017-02-11 DIAGNOSIS — I10 Essential (primary) hypertension: Secondary | ICD-10-CM | POA: Diagnosis not present

## 2017-02-11 DIAGNOSIS — E7800 Pure hypercholesterolemia, unspecified: Secondary | ICD-10-CM

## 2017-02-11 NOTE — Progress Notes (Signed)
Cardiology Office Note    Date:  02/13/2017   ID:  [name] [name], DOB Sep 24, 1950, MRN [id number]  PCP:  Beaulah Dinning, MD  Cardiologist:  Dr. Jens Som   Chief Complaint  Patient presents with  . Follow-up    seen for Dr. Jens Som, eval for chest pain    History of Present Illness:  [name] [name] is a 66 y.o. female with PMH of MVP, HTN, and HLD. Her carotid Doppler in 2010 showed no significant stenosis. Stress echo in February 2011 was normal. Echocardiogram in 2016 showed normal LV function. She was last seen by Dr. Jens Som in October 2016, however hydrochlorothiazide was discontinued prior to that due to orthostatic symptoms. Most recently, patient was seen by her primary care provider Dr. Jonathon Jordan on 01/08/2017. He complained of some chest tightness and fullness with shortness of breath, increased heart rate and dizziness. She was also having some microcytic anemia as well. She was referred to both cardiology service and GI service. She was placed on iron therapy. TSH normal on 01/08/2017.  She says for the past several months, she has been having some tightness under the left chest. Although she initially says her chest discomfort occurs more so with exertion, however later she says it actually occurs both at rest and with exertion. She also has been having some increasing dyspnea and fatigue. I recommended initial workup with a treadmill Myoview. Some of her symptom is concerning, I would have very low threshold to recommend cardiac catheterization.   Past Medical History:  Diagnosis Date  . Depression   . Hyperlipidemia   . Hypertension   . Microcytic anemia   . Mitral valve prolapse     Past Surgical History:  Procedure Laterality Date  . PARTIAL HYSTERECTOMY      Current Medications: Outpatient Medications Prior to Visit  Medication Sig Dispense Refill  . amLODipine (NORVASC) 5 MG tablet TAKE 1 TABLET (5 MG TOTAL) BY MOUTH DAILY. PLEASE SCHEDULE  APPOINTMENT FOR REFILLS 30 tablet 0  . aspirin 81 MG tablet Take 81 mg by mouth daily.    . ferrous sulfate 325 (65 FE) MG tablet Take 1 tablet (325 mg total) by mouth daily with breakfast. 30 tablet 3  . fluticasone (FLONASE) 50 MCG/ACT nasal spray Place 1 spray daily into both nostrils.    . methocarbamol (ROBAXIN) 500 MG tablet Take 1 tablet (500 mg total) by mouth every 8 (eight) hours as needed. 60 tablet 1  . omeprazole (PRILOSEC) 20 MG capsule TAKE 1 CAPSULE BY MOUTH EVERY DAY 30 capsule 0  . sertraline (ZOLOFT) 100 MG tablet Take 1 tablet (100 mg total) by mouth daily. 30 tablet 3  . triamcinolone cream (KENALOG) 0.1 % Apply 1 application topically 2 (two) times daily. 30 g 0  . fluticasone (FLONASE) 50 MCG/ACT nasal spray Place 2 sprays into the nose daily. spray 1-2 sprays into both nostrils once a day for allergies (Patient not taking: Reported on 09/04/2016) 16 g 3  . latanoprost (XALATAN) 0.005 % ophthalmic solution Place 1 drop into both eyes at bedtime.  6   No facility-administered medications prior to visit.      Allergies:   Patient has no known allergies.   Social History   Socioeconomic History  . Marital status: Married    Spouse name: None  . Number of children: 3  . Years of education: None  . Highest education level: None  Social Needs  . Financial resource strain: None  .  Food insecurity - worry: None  . Food insecurity - inability: None  . Transportation needs - medical: None  . Transportation needs - non-medical: None  Occupational History    Comment: Retired  Tobacco Use  . Smoking status: Former Smoker    Packs/day: 0.50    Years: 20.00    Pack years: 10.00    Last attempt to quit: 07/30/2004    Years since quitting: 12.5  . Smokeless tobacco: Never Used  Substance and Sexual Activity  . Alcohol use: Yes    Alcohol/week: 0.0 oz    Comment: Occasional  . Drug use: No  . Sexual activity: Yes    Partners: Male  Other Topics Concern  . None    Social History Narrative  . None     Family History:  The patient's family history includes Cancer in her father; Dementia in her sister; Diabetes in her mother; Heart disease in her mother; Hypertension in her mother and sister; Pancreatitis in her sister.   ROS:   Please see the history of present illness.    ROS All other systems reviewed and are negative.   PHYSICAL EXAM:   VS:  BP 138/68   Pulse (!) 58   Ht 5\' 2"  (1.575 m)   Wt 172 lb 12.8 oz (78.4 kg)   BMI 31.61 kg/m    GEN: Well nourished, well developed, in no acute distress  HEENT: normal  Neck: no JVD, carotid bruits, or masses Cardiac: RRR; no murmurs, rubs, or gallops,no edema  Respiratory:  clear to auscultation bilaterally, normal work of breathing GI: soft, nontender, nondistended, + BS MS: no deformity or atrophy  Skin: warm and dry, no rash Neuro:  Alert and Oriented x 3, Strength and sensation are intact Psych: euthymic mood, full affect  Wt Readings from Last 3 Encounters:  02/11/17 172 lb 12.8 oz (78.4 kg)  01/08/17 177 lb 3.2 oz (80.4 kg)  09/04/16 178 lb (80.7 kg)      Studies/Labs Reviewed:   EKG:  EKG is ordered today.  The ekg ordered today demonstrates normal sinus rhythm without significant ST-T wave changes  Recent Labs: 01/08/2017: ALT 13; BUN 13; Creatinine, Ser 0.92; Hemoglobin 10.4; Platelets 377; Potassium 4.3; Sodium 143; TSH 0.586   Lipid Panel    Component Value Date/Time   CHOL 234 (H) 02/11/2017 1530   TRIG 114 02/11/2017 1530   HDL 55 02/11/2017 1530   CHOLHDL 4.3 02/11/2017 1530   CHOLHDL 3.1 05/29/2015 1527   VLDL 17 05/29/2015 1527   LDLCALC 156 (H) 02/11/2017 1530   LDLDIRECT 129 (H) 01/01/2010 2219    Additional studies/ records that were reviewed today include:   Echo 12/26/2014 LV EF: 60% -   65%  Study Conclusions  - Left ventricle: The cavity size was normal. Systolic function was   normal. The estimated ejection fraction was in the range of 60%   to  65%. Wall motion was normal; there were no regional wall   motion abnormalities. Left ventricular diastolic function   parameters were normal. - Aortic valve: Trileaflet; normal thickness leaflets. There was no   regurgitation. - Aortic root: The aortic root was normal in size. - Mitral valve: Structurally normal valve. - Left atrium: The atrium was normal in size. - Right ventricle: The cavity size was normal. Wall thickness was   normal. Systolic function was normal. - Right atrium: The atrium was normal in size. - Tricuspid valve: There was trivial regurgitation. - Pulmonic valve: There  was no regurgitation. - Pulmonary arteries: Systolic pressure was within the normal   range. PA peak pressure: 32 mm Hg (S). - Inferior vena cava: The vessel was normal in size. - Pericardium, extracardiac: There was no pericardial effusion.   ASSESSMENT:    1. Chest pain, unspecified type   2. HYPERCHOLESTEROLEMIA   3. Essential hypertension      PLAN:  In order of problems listed above:  1. Chest pain: Occurs both at rest and with exertion, her symptom is somewhat concerning. I recommended a treadmill Myoview for further assessment. Her cardiac risk factors include hypertension, hyperlipidemia, family history of CAD and her age.  2. Hypertension: Blood pressure very well controlled.  3. Hyperlipidemia: Obtain fasting lipid panel.     Medication Adjustments/Labs and Tests Ordered: Current medicines are reviewed at length with the patient today.  Concerns regarding medicines are outlined above.  Medication changes, Labs and Tests ordered today are listed in the Patient Instructions below. Patient Instructions  Wynema Birch, [place] recommends that you return for lab work TODAY to check your cholesterol   [place], [place] has requested that you have en exercise stress myoview. For further information please visit [email or web address] Please follow instruction sheet, as given.  Wynema Birch, [place] recommends that you  schedule a follow-up appointment in: THREE MONTHS with Dr. Jens Som      Signed, Azalee Course, [place]  02/13/2017 2:04 PM    [facility] [address] [place], [place], [place]  [address] Phone: [phone]; Fax: [phone]

## 2017-02-11 NOTE — Patient Instructions (Signed)
Isaac Laud, [place] recommends that you return for lab work TODAY to check your cholesterol   [place], [place] has requested that you have en exercise stress myoview. For further information please visit [email or web address]. Please follow instruction sheet, as given.  Isaac Laud, [place] recommends that you schedule a follow-up appointment in: [place] with Dr. Stanford Breed

## 2017-02-12 LAB — LIPID PANEL
CHOLESTEROL TOTAL: 234 mg/dL — AB (ref 100–199)
Chol/HDL Ratio: 4.3 ratio (ref 0.0–4.4)
HDL: 55 mg/dL (ref >39)
LDL CALC: 156 mg/dL — AB (ref 0–99)
Triglycerides: 114 mg/dL (ref 0–149)
VLDL CHOLESTEROL CAL: 23 mg/dL (ref 5–40)

## 2017-02-13 ENCOUNTER — Encounter: Payer: Self-pay | Admitting: Physician Assistant

## 2017-02-17 ENCOUNTER — Telehealth (HOSPITAL_COMMUNITY): Payer: Self-pay

## 2017-02-17 NOTE — Telephone Encounter (Signed)
Encounter complete. 

## 2017-02-23 ENCOUNTER — Ambulatory Visit (HOSPITAL_COMMUNITY)
Admission: RE | Admit: 2017-02-23 | Discharge: 2017-02-23 | Disposition: A | Discharge status: Home / Self Care | Payer: Medicare Other | Source: Ambulatory Visit | Attending: Cardiology | Admitting: Cardiology

## 2017-02-23 DIAGNOSIS — K219 Gastro-esophageal reflux disease without esophagitis: Secondary | ICD-10-CM | POA: Diagnosis not present

## 2017-02-23 DIAGNOSIS — F329 Major depressive disorder, single episode, unspecified: Secondary | ICD-10-CM | POA: Diagnosis not present

## 2017-02-23 DIAGNOSIS — I341 Nonrheumatic mitral (valve) prolapse: Secondary | ICD-10-CM | POA: Diagnosis not present

## 2017-02-23 DIAGNOSIS — R0602 Shortness of breath: Secondary | ICD-10-CM | POA: Insufficient documentation

## 2017-02-23 DIAGNOSIS — Z87891 Personal history of nicotine dependence: Secondary | ICD-10-CM | POA: Diagnosis not present

## 2017-02-23 DIAGNOSIS — R42 Dizziness and giddiness: Secondary | ICD-10-CM | POA: Diagnosis not present

## 2017-02-23 DIAGNOSIS — I10 Essential (primary) hypertension: Secondary | ICD-10-CM | POA: Insufficient documentation

## 2017-02-23 DIAGNOSIS — R9439 Abnormal result of other cardiovascular function study: Secondary | ICD-10-CM | POA: Insufficient documentation

## 2017-02-23 DIAGNOSIS — F419 Anxiety disorder, unspecified: Secondary | ICD-10-CM | POA: Diagnosis not present

## 2017-02-23 DIAGNOSIS — R7303 Prediabetes: Secondary | ICD-10-CM | POA: Insufficient documentation

## 2017-02-23 DIAGNOSIS — Z8249 Family history of ischemic heart disease and other diseases of the circulatory system: Secondary | ICD-10-CM | POA: Insufficient documentation

## 2017-02-23 DIAGNOSIS — R252 Cramp and spasm: Secondary | ICD-10-CM | POA: Insufficient documentation

## 2017-02-23 DIAGNOSIS — R079 Chest pain, unspecified: Secondary | ICD-10-CM | POA: Insufficient documentation

## 2017-02-23 LAB — MYOCARDIAL PERFUSION IMAGING
CHL CUP NUCLEAR SRS: 4
CHL CUP RESTING HR STRESS: 58 {beats}/min
CHL RATE OF PERCEIVED EXERTION: 17
CSEPEDS: 0 s
CSEPHR: 92 %
Estimated workload: 7 METS
Exercise duration (min): 7 min
LVDIAVOL: 73 mL (ref 46–106)
LVSYSVOL: 31 mL
MPHR: 153 {beats}/min
Peak HR: 141 {beats}/min
SDS: 8
SSS: 12
TID: 1.17

## 2017-02-23 MED ORDER — TECHNETIUM TC 99M TETROFOSMIN IV KIT
30.4000 | PACK | Freq: Once | INTRAVENOUS | Status: AC | PRN
  Administered 2017-02-23: 30.4 via INTRAVENOUS
  Filled 2017-02-23: qty 31

## 2017-02-23 MED ORDER — TECHNETIUM TC 99M TETROFOSMIN IV KIT
10.2000 | PACK | Freq: Once | INTRAVENOUS | Status: AC | PRN
  Administered 2017-02-23: 10.2 via INTRAVENOUS
  Filled 2017-02-23: qty 11

## 2017-02-24 ENCOUNTER — Ambulatory Visit: Payer: Medicare Other | Admitting: Student

## 2017-02-24 ENCOUNTER — Encounter: Payer: Self-pay | Admitting: Student

## 2017-02-24 VITALS — BP 130/48 | HR 72 | Ht 62.0 in | Wt 174.0 lb

## 2017-02-24 DIAGNOSIS — R9439 Abnormal result of other cardiovascular function study: Secondary | ICD-10-CM | POA: Diagnosis not present

## 2017-02-24 DIAGNOSIS — E7800 Pure hypercholesterolemia, unspecified: Secondary | ICD-10-CM

## 2017-02-24 DIAGNOSIS — I10 Essential (primary) hypertension: Secondary | ICD-10-CM

## 2017-02-24 DIAGNOSIS — R079 Chest pain, unspecified: Secondary | ICD-10-CM | POA: Diagnosis not present

## 2017-02-24 MED ORDER — ISOSORBIDE MONONITRATE ER 30 MG PO TB24: 30.0000 mg | ORAL_TABLET | Freq: Every day | ORAL | 3 refills | Status: DC

## 2017-02-24 MED ORDER — NITROGLYCERIN 0.4 MG SL SUBL: 0.4000 mg | SUBLINGUAL_TABLET | SUBLINGUAL | 3 refills | Status: DC | PRN

## 2017-02-24 NOTE — Progress Notes (Signed)
Appt rescheduled with Bernerd Pho at [facility] today. Discussed with patient, other than chest pain during stress test yesterday, last chest pain was last Saturday.

## 2017-02-24 NOTE — Progress Notes (Signed)
I have called the patient, appt scheduled with Rosaria Ferries tomorrow at 2:30PM. Patient informed that if chest pain does not go away with nitroglycerine, she need to go to ED.

## 2017-02-24 NOTE — Progress Notes (Signed)
Need earliest visit with app to discuss cath. Prescribe sublingual nitro. Also start on Imdur 30mg  daily.

## 2017-02-24 NOTE — H&P (View-Only) (Signed)
Cardiology Office Note    Date:  02/24/2017   ID:  [name] [name], DOB [birth year], MRN [id number]  PCP:  Beaulah Dinning, MD  Cardiologist: Dr. Jens Som   Chief Complaint  Patient presents with  . Follow-up    Abnormal Stress Test    History of Present Illness:    [name] [name] is a 66 y.o. female with past medical history of HTN, HLD and MVP who presents to the office today for follow-up of her recent stress test.   She was recently evaluated by [name] Course, PA-C on 02/11/2017 and reported having worsening chest discomfort and dyspnea on exertion for the past several months. Pain was exacerbated by activity and relieved with rest. EKG at that time showed no significant ST or T-wave changes and a Treadmill Myoview was recommended for further evaluation. This was performed on 02/23/2017 and showed ST segment depression of 4 mm during stress in the II, III, aVF, V6, V5 and V4 leads. A large size, moderate intensity reversible anterior perfusion defect and moderate size, partially reversible lateral perfusion defect suggestive of ischemia was noted, overall being a high-risk study.    In talking with the patient today, she reports having chest pain during her stress test yesterday but says this resolved with rest. She denies any repeat episodes of chest pain since. No recent dyspnea on exertion, palpitations, orthopnea, PND, or lower extremity edema. Has a known anemia and is on iron supplementation. Denies any evidence of active bleeding. No upcoming surgical procedures.   She has no known history of CAD and has never undergone a cath before but is familiar with the procedure as her mother and father both had CAD and had catheterizations. Unaware of any history of contrast allergies.   Past Medical History:  Diagnosis Date  . Depression   . Hyperlipidemia   . Hypertension   . Microcytic anemia   . Mitral valve prolapse     Past Surgical History:  Procedure Laterality  Date  . PARTIAL HYSTERECTOMY      Current Medications: Outpatient Medications Prior to Visit  Medication Sig Dispense Refill  . aspirin 81 MG tablet Take 81 mg by mouth daily.    . cyclobenzaprine (FLEXERIL) 10 MG tablet TAKE 1 TABLET BY MOUTH AT BEDTIME FOR BACK PAIN AND AS SLEEP AID  1  . ferrous sulfate 325 (65 FE) MG tablet Take 1 tablet (325 mg total) by mouth daily with breakfast. 30 tablet 3  . fluticasone (FLONASE) 50 MCG/ACT nasal spray Place 1 spray daily into both nostrils.    [name] omeprazole (PRILOSEC) 20 MG capsule TAKE 1 CAPSULE BY MOUTH EVERY DAY 30 capsule 0  . sertraline (ZOLOFT) 100 MG tablet Take 1 tablet (100 mg total) by mouth daily. 30 tablet 3  . amLODipine (NORVASC) 5 MG tablet TAKE 1 TABLET (5 MG TOTAL) BY MOUTH DAILY. PLEASE SCHEDULE APPOINTMENT FOR REFILLS (Patient not taking: Reported on 02/24/2017) 30 tablet 0  . methocarbamol (ROBAXIN) 500 MG tablet Take 1 tablet (500 mg total) by mouth every 8 (eight) hours as needed. (Patient not taking: Reported on 02/24/2017) 60 tablet 1  . triamcinolone cream (KENALOG) 0.1 % Apply 1 application topically 2 (two) times daily. (Patient not taking: Reported on 02/24/2017) 30 g 0   No facility-administered medications prior to visit.      Allergies:   Patient has no known allergies.   Social History   Socioeconomic History  . Marital status: Married    Spouse  name: None  . Number of children: 3  . Years of education: None  . Highest education level: None  Social Needs  . Financial resource strain: None  . Food insecurity - worry: None  . Food insecurity - inability: None  . Transportation needs - medical: None  . Transportation needs - non-medical: None  Occupational History    Comment: Retired  Tobacco Use  . Smoking status: Former Smoker    Packs/day: 0.50    Years: 20.00    Pack years: 10.00    Last attempt to quit: 07/30/2004    Years since quitting: 12.5  . Smokeless tobacco: Never Used  Substance and  Sexual Activity  . Alcohol use: Yes    Alcohol/week: 0.0 oz    Comment: Occasional  . Drug use: No  . Sexual activity: Yes    Partners: Male  Other Topics Concern  . None  Social History Narrative  . None     Family History:  The patient's family history includes Cancer in her father; Dementia in her sister; Diabetes in her mother; Heart disease in her mother; Hypertension in her mother and sister; Pancreatitis in her sister.   Review of Systems:   Please see the history of present illness.     General:  No chills, fever, night sweats or weight changes.  Cardiovascular:  No edema, orthopnea, palpitations, paroxysmal nocturnal dyspnea. Positive for chest pain and dyspnea on exertion.  Dermatological: No rash, lesions/masses Respiratory: No cough, dyspnea Urologic: No hematuria, dysuria Abdominal:   No nausea, vomiting, diarrhea, bright red blood per rectum, melena, or hematemesis Neurologic:  No visual changes, wkns, changes in mental status. All other systems reviewed and are otherwise negative except as noted above.   Physical Exam:    VS:  BP (!) 130/48   Pulse 72   Ht 5\' 2"  (1.575 m)   Wt 174 lb (78.9 kg)   SpO2 98%   BMI 31.83 kg/m    General: Well developed, well nourished [place] female appearing in no acute distress. Head: Normocephalic, atraumatic, sclera non-icteric, no xanthomas, nares are without discharge.  Neck: No carotid bruits. JVD not elevated.  Lungs: Respirations regular and unlabored, without wheezes or rales.  Heart: Regular rate and rhythm. No S3 or S4.  No murmur, no rubs, or gallops appreciated. Abdomen: Soft, non-tender, non-distended with normoactive bowel sounds. No hepatomegaly. No rebound/guarding. No obvious abdominal masses. Msk:  Strength and tone appear normal for age. No joint deformities or effusions. Extremities: No clubbing or cyanosis. No lower extremity edema.  Distal pedal pulses are 2+ bilaterally. Neuro: Alert and  oriented X 3. Moves all extremities spontaneously. No focal deficits noted. Psych:  Responds to questions appropriately with a normal affect. Skin: No rashes or lesions noted  Wt Readings from Last 3 Encounters:  02/24/17 174 lb (78.9 kg)  02/23/17 172 lb (78 kg)  02/11/17 172 lb 12.8 oz (78.4 kg)     Studies/Labs Reviewed:   EKG:  EKG is not ordered today.    Recent Labs: 01/08/2017: ALT 13; BUN 13; Creatinine, Ser 0.92; Hemoglobin 10.4; Platelets 377; Potassium 4.3; Sodium 143; TSH 0.586   Lipid Panel    Component Value Date/Time   CHOL 234 (H) 02/11/2017 1530   TRIG 114 02/11/2017 1530   HDL 55 02/11/2017 1530   CHOLHDL 4.3 02/11/2017 1530   CHOLHDL 3.1 05/29/2015 1527   VLDL 17 05/29/2015 1527   LDLCALC 156 (H) 02/11/2017 1530   LDLDIRECT 129 (H)  01/01/2010 2219    Additional studies/ records that were reviewed today include:   NST: 02/23/2017  The left ventricular ejection fraction is normal (55-65%).  Nuclear stress EF: 58%.  Blood pressure demonstrated a normal response to exercise.  Horizontal ST segment depression ST segment depression of 4 mm was noted during stress in the II, III, aVF, V6, V5 and V4 leads.  T wave inversion was noted during stress in the II, III, aVF, V6, V5 and V4 leads.  Defect 1: There is a large defect of moderate severity.  Findings consistent with ischemia.  This is a high risk study.   Large size, moderate intensity reversible anterior perfusion defect and moderate size, partially reversible lateral perfusion defect suggestive of ischemia. LVEF 58% with normal wall motion. 4 mm horizontal ST segment depression at peak exercise inferiorly and laterally with T wave inversions diagnostic of ischemia. The patient experienced exercise-limiting angina, fatigue and dyspnea during the study. This is a high risk study.  Assessment:    1. Abnormal stress test   2. Chest pain, unspecified type   3. Essential hypertension   4.  HYPERCHOLESTEROLEMIA      Plan:   In order of problems listed above:  1. Abnormal Stress Test/Chest Pain - the patient was recently evaluated for chest pain and dyspnea on exertion. A Treadmill Myoview was recommended and showed significant EKG changes with ST segment depression of 4 mm during stress in the II, III, aVF, V6, V5 and V4 leads. Imaging showed a large size, moderate intensity reversible anterior perfusion defect and moderate size, partially reversible lateral perfusion defect suggestive of ischemia.  - with her recent symptoms concerning for unstable angina and abnormal stress test, a cardiac catheterization is recommended for definitive evaluation. This was reviewed with Dr. Jens Som who was in agreement. The patient understands that risks include but are not limited to stroke (1 in 1000), death (1 in 1000), kidney failure [usually temporary] (1 in 500), bleeding (1 in 200), allergic reaction [possibly serious] (1 in 200). Will plan for lab work today and catheterization next Monday. Will continue ASA and start Imdur 30mg  daily as outlined by [name] Course, PA-C. Also provided with Rx for SL NTG. Consider initiation of BB and statin therapy pending catheterization results.   2. HTN - BP is well-controlled at 130/48 during today's visit.  - was previously on Amlodipine but this was discontinued by her PCP.   3. HLD - recent Lipid Panel on 02/11/2017 showed total cholesterol of 234, HDL 55, and LDL 156. She wishes to avoid the addition of further medications until her cath results. If noted to have CAD, would recommend initiation of high-intensity statin therapy with goal LDL < 70.   Medication Adjustments/Labs and Tests Ordered: Current medicines are reviewed at length with the patient today.  Concerns regarding medicines are outlined above.  Medication changes, Labs and Tests ordered today are listed in the Patient Instructions below. Patient Instructions    [place] [facility] CARDIOVASCULAR DIVISION [facility] [address] [place] [address] Dept: [phone] Loc: [phone]  [name] [name]  02/24/2017  You are scheduled for a CATH on Monday, December 3 with Dr. Verne Carrow.  1. Please arrive at the [facility] (Main Entrance A) at [facility]: [address] [place], [place] [address] at 5:30 AM (two hours before your procedure to ensure your preparation). Free valet parking service is available.   Special note: Every effort is  made to have your procedure done on time. Please understand that emergencies sometimes delay scheduled procedures.  2. Diet: Do not eat or drink anything after midnight prior to your procedure except sips of water to take medications.  3. Labs:  Will be drawn today. Please stop at the lab and complete.   4. Medication instructions in preparation for your procedure:  Current Outpatient Medications (Respiratory):  .  fluticasone (FLONASE) 50 MCG/ACT nasal spray, Place 1 spray daily into both nostrils.  Current Outpatient Medications (Analgesics):  .  aspirin 81 MG tablet, Take 81 mg by mouth daily.  Current Outpatient Medications (Hematological):  .  ferrous sulfate 325 (65 FE) MG tablet, Take 1 tablet (325 mg total) by mouth daily with breakfast.  Current Outpatient Medications (Other):  .  cyclobenzaprine (FLEXERIL) 10 MG tablet, TAKE 1 TABLET BY MOUTH AT BEDTIME FOR BACK PAIN AND AS SLEEP AID .  omeprazole (PRILOSEC) 20 MG capsule, TAKE 1 CAPSULE BY MOUTH EVERY DAY .  sertraline (ZOLOFT) 100 MG tablet, Take 1 tablet (100 mg total) by mouth daily. *For reference purposes while preparing patient instructions.   Delete this med list prior to printing instructions for patient.*  On the morning of your procedure, take your Aspirin and all morning medicines listed above.  You may use sips of water.  5. Plan for one night stay--bring personal belongings. 6.  Bring a current list of your medications and current insurance cards. 7. You MUST have a responsible person to drive you home. 8. Someone MUST be with you the first 24 hours after you arrive home or your discharge will be delayed. 9. Please wear clothes that are easy to get on and off and wear slip-on shoes.  Thank you for allowing [place] to care for you!   -- [facility]   Your physician recommends that you schedule a follow-up appointment in: 2 weeks after Cath with Dr Jens Som or [name], PA-C  START Imdur 30 mg Take 1 tablet once a day  START Nitrostat as needed for chest pain   Signed, Ellsworth Lennox, PA-C  02/24/2017 4:49 PM    [facility] [address], [address] [place], [place]  [address] Phone: [phone]; Fax: [phone]  [address], [address] [place], [place] [address] Phone: [phone]

## 2017-02-24 NOTE — Progress Notes (Signed)
Cardiology Office Note    Date:  02/24/2017   ID:  [name] [name], DOB [birth year], MRN [id number]  PCP:  Beaulah Dinning, MD  Cardiologist: Dr. Jens Som   Chief Complaint  Patient presents with  . Follow-up    Abnormal Stress Test    History of Present Illness:    [name] [name] is a 66 y.o. female with past medical history of HTN, HLD and MVP who presents to the office today for follow-up of her recent stress test.   She was recently evaluated by Azalee Course, PA-C on 02/11/2017 and reported having worsening chest discomfort and dyspnea on exertion for the past several months. Pain was exacerbated by activity and relieved with rest. EKG at that time showed no significant ST or T-wave changes and a Treadmill Myoview was recommended for further evaluation. This was performed on 02/23/2017 and showed ST segment depression of 4 mm during stress in the II, III, aVF, V6, V5 and V4 leads. A large size, moderate intensity reversible anterior perfusion defect and moderate size, partially reversible lateral perfusion defect suggestive of ischemia was noted, overall being a high-risk study.    In talking with the patient today, she reports having chest pain during her stress test yesterday but says this resolved with rest. She denies any repeat episodes of chest pain since. No recent dyspnea on exertion, palpitations, orthopnea, PND, or lower extremity edema. Has a known anemia and is on iron supplementation. Denies any evidence of active bleeding. No upcoming surgical procedures.   She has no known history of CAD and has never undergone a cath before but is familiar with the procedure as her mother and father both had CAD and had catheterizations. Unaware of any history of contrast allergies.   Past Medical History:  Diagnosis Date  . Depression   . Hyperlipidemia   . Hypertension   . Microcytic anemia   . Mitral valve prolapse     Past Surgical History:  Procedure Laterality  Date  . PARTIAL HYSTERECTOMY      Current Medications: Outpatient Medications Prior to Visit  Medication Sig Dispense Refill  . aspirin 81 MG tablet Take 81 mg by mouth daily.    . cyclobenzaprine (FLEXERIL) 10 MG tablet TAKE 1 TABLET BY MOUTH AT BEDTIME FOR BACK PAIN AND AS SLEEP AID  1  . ferrous sulfate 325 (65 FE) MG tablet Take 1 tablet (325 mg total) by mouth daily with breakfast. 30 tablet 3  . fluticasone (FLONASE) 50 MCG/ACT nasal spray Place 1 spray daily into both nostrils.    [name] omeprazole (PRILOSEC) 20 MG capsule TAKE 1 CAPSULE BY MOUTH EVERY DAY 30 capsule 0  . sertraline (ZOLOFT) 100 MG tablet Take 1 tablet (100 mg total) by mouth daily. 30 tablet 3  . amLODipine (NORVASC) 5 MG tablet TAKE 1 TABLET (5 MG TOTAL) BY MOUTH DAILY. PLEASE SCHEDULE APPOINTMENT FOR REFILLS (Patient not taking: Reported on 02/24/2017) 30 tablet 0  . methocarbamol (ROBAXIN) 500 MG tablet Take 1 tablet (500 mg total) by mouth every 8 (eight) hours as needed. (Patient not taking: Reported on 02/24/2017) 60 tablet 1  . triamcinolone cream (KENALOG) 0.1 % Apply 1 application topically 2 (two) times daily. (Patient not taking: Reported on 02/24/2017) 30 g 0   No facility-administered medications prior to visit.      Allergies:   Patient has no known allergies.   Social History   Socioeconomic History  . Marital status: Married    Spouse  name: None  . Number of children: 3  . Years of education: None  . Highest education level: None  Social Needs  . Financial resource strain: None  . Food insecurity - worry: None  . Food insecurity - inability: None  . Transportation needs - medical: None  . Transportation needs - non-medical: None  Occupational History    Comment: Retired  Tobacco Use  . Smoking status: Former Smoker    Packs/day: 0.50    Years: 20.00    Pack years: 10.00    Last attempt to quit: 07/30/2004    Years since quitting: 12.5  . Smokeless tobacco: Never Used  Substance and  Sexual Activity  . Alcohol use: Yes    Alcohol/week: 0.0 oz    Comment: Occasional  . Drug use: No  . Sexual activity: Yes    Partners: Male  Other Topics Concern  . None  Social History Narrative  . None     Family History:  The patient's family history includes Cancer in her father; Dementia in her sister; Diabetes in her mother; Heart disease in her mother; Hypertension in her mother and sister; Pancreatitis in her sister.   Review of Systems:   Please see the history of present illness.     General:  No chills, fever, night sweats or weight changes.  Cardiovascular:  No edema, orthopnea, palpitations, paroxysmal nocturnal dyspnea. Positive for chest pain and dyspnea on exertion.  Dermatological: No rash, lesions/masses Respiratory: No cough, dyspnea Urologic: No hematuria, dysuria Abdominal:   No nausea, vomiting, diarrhea, bright red blood per rectum, melena, or hematemesis Neurologic:  No visual changes, wkns, changes in mental status. All other systems reviewed and are otherwise negative except as noted above.   Physical Exam:    VS:  BP (!) 130/48   Pulse 72   Ht 5\' 2"  (1.575 m)   Wt 174 lb (78.9 kg)   SpO2 98%   BMI 31.83 kg/m    General: Well developed, well nourished [place] female appearing in no acute distress. Head: Normocephalic, atraumatic, sclera non-icteric, no xanthomas, nares are without discharge.  Neck: No carotid bruits. JVD not elevated.  Lungs: Respirations regular and unlabored, without wheezes or rales.  Heart: Regular rate and rhythm. No S3 or S4.  No murmur, no rubs, or gallops appreciated. Abdomen: Soft, non-tender, non-distended with normoactive bowel sounds. No hepatomegaly. No rebound/guarding. No obvious abdominal masses. Msk:  Strength and tone appear normal for age. No joint deformities or effusions. Extremities: No clubbing or cyanosis. No lower extremity edema.  Distal pedal pulses are 2+ bilaterally. Neuro: Alert and  oriented X 3. Moves all extremities spontaneously. No focal deficits noted. Psych:  Responds to questions appropriately with a normal affect. Skin: No rashes or lesions noted  Wt Readings from Last 3 Encounters:  02/24/17 174 lb (78.9 kg)  02/23/17 172 lb (78 kg)  02/11/17 172 lb 12.8 oz (78.4 kg)     Studies/Labs Reviewed:   EKG:  EKG is not ordered today.    Recent Labs: 01/08/2017: ALT 13; BUN 13; Creatinine, Ser 0.92; Hemoglobin 10.4; Platelets 377; Potassium 4.3; Sodium 143; TSH 0.586   Lipid Panel    Component Value Date/Time   CHOL 234 (H) 02/11/2017 1530   TRIG 114 02/11/2017 1530   HDL 55 02/11/2017 1530   CHOLHDL 4.3 02/11/2017 1530   CHOLHDL 3.1 05/29/2015 1527   VLDL 17 05/29/2015 1527   LDLCALC 156 (H) 02/11/2017 1530   LDLDIRECT 129 (H)  01/01/2010 2219    Additional studies/ records that were reviewed today include:   NST: 02/23/2017  The left ventricular ejection fraction is normal (55-65%).  Nuclear stress EF: 58%.  Blood pressure demonstrated a normal response to exercise.  Horizontal ST segment depression ST segment depression of 4 mm was noted during stress in the II, III, aVF, V6, V5 and V4 leads.  T wave inversion was noted during stress in the II, III, aVF, V6, V5 and V4 leads.  Defect 1: There is a large defect of moderate severity.  Findings consistent with ischemia.  This is a high risk study.   Large size, moderate intensity reversible anterior perfusion defect and moderate size, partially reversible lateral perfusion defect suggestive of ischemia. LVEF 58% with normal wall motion. 4 mm horizontal ST segment depression at peak exercise inferiorly and laterally with T wave inversions diagnostic of ischemia. The patient experienced exercise-limiting angina, fatigue and dyspnea during the study. This is a high risk study.  Assessment:    1. Abnormal stress test   2. Chest pain, unspecified type   3. Essential hypertension   4.  HYPERCHOLESTEROLEMIA      Plan:   In order of problems listed above:  1. Abnormal Stress Test/Chest Pain - the patient was recently evaluated for chest pain and dyspnea on exertion. A Treadmill Myoview was recommended and showed significant EKG changes with ST segment depression of 4 mm during stress in the II, III, aVF, V6, V5 and V4 leads. Imaging showed a large size, moderate intensity reversible anterior perfusion defect and moderate size, partially reversible lateral perfusion defect suggestive of ischemia.  - with her recent symptoms concerning for unstable angina and abnormal stress test, a cardiac catheterization is recommended for definitive evaluation. This was reviewed with Dr. Jens Som who was in agreement. The patient understands that risks include but are not limited to stroke (1 in 1000), death (1 in 1000), kidney failure [usually temporary] (1 in 500), bleeding (1 in 200), allergic reaction [possibly serious] (1 in 200). Will plan for lab work today and catheterization next Monday. Will continue ASA and start Imdur 30mg  daily as outlined by Azalee Course, PA-C. Also provided with Rx for SL NTG. Consider initiation of BB and statin therapy pending catheterization results.   2. HTN - BP is well-controlled at 130/48 during today's visit.  - was previously on Amlodipine but this was discontinued by her PCP.   3. HLD - recent Lipid Panel on 02/11/2017 showed total cholesterol of 234, HDL 55, and LDL 156. She wishes to avoid the addition of further medications until her cath results. If noted to have CAD, would recommend initiation of high-intensity statin therapy with goal LDL < 70.   Medication Adjustments/Labs and Tests Ordered: Current medicines are reviewed at length with the patient today.  Concerns regarding medicines are outlined above.  Medication changes, Labs and Tests ordered today are listed in the Patient Instructions below. Patient Instructions    [place] [facility] CARDIOVASCULAR DIVISION [facility] [address] [place] [address] Dept: [phone] Loc: [phone]  [name] [name]  02/24/2017  You are scheduled for a CATH on Monday, December 3 with Dr. Verne Carrow.  1. Please arrive at the [facility] (Main Entrance A) at [facility]: [address] [place], [place] [address] at 5:30 AM (two hours before your procedure to ensure your preparation). Free valet parking service is available.   Special note: Every effort is  made to have your procedure done on time. Please understand that emergencies sometimes delay scheduled procedures.  2. Diet: Do not eat or drink anything after midnight prior to your procedure except sips of water to take medications.  3. Labs:  Will be drawn today. Please stop at the lab and complete.   4. Medication instructions in preparation for your procedure:  Current Outpatient Medications (Respiratory):  .  fluticasone (FLONASE) 50 MCG/ACT nasal spray, Place 1 spray daily into both nostrils.  Current Outpatient Medications (Analgesics):  .  aspirin 81 MG tablet, Take 81 mg by mouth daily.  Current Outpatient Medications (Hematological):  .  ferrous sulfate 325 (65 FE) MG tablet, Take 1 tablet (325 mg total) by mouth daily with breakfast.  Current Outpatient Medications (Other):  .  cyclobenzaprine (FLEXERIL) 10 MG tablet, TAKE 1 TABLET BY MOUTH AT BEDTIME FOR BACK PAIN AND AS SLEEP AID .  omeprazole (PRILOSEC) 20 MG capsule, TAKE 1 CAPSULE BY MOUTH EVERY DAY .  sertraline (ZOLOFT) 100 MG tablet, Take 1 tablet (100 mg total) by mouth daily. *For reference purposes while preparing patient instructions.   Delete this med list prior to printing instructions for patient.*  On the morning of your procedure, take your Aspirin and all morning medicines listed above.  You may use sips of water.  5. Plan for one night stay--bring personal belongings. 6.  Bring a current list of your medications and current insurance cards. 7. You MUST have a responsible person to drive you home. 8. Someone MUST be with you the first 24 hours after you arrive home or your discharge will be delayed. 9. Please wear clothes that are easy to get on and off and wear slip-on shoes.  Thank you for allowing [place] to care for you!   -- [place] [facility]   Your physician recommends that you schedule a follow-up appointment in: 2 weeks after Cath with Dr Jens Som or [name], PA-C  START Imdur 30 mg Take 1 tablet once a day  START Nitrostat as needed for chest pain   Signed, Ellsworth Lennox, PA-C  02/24/2017 4:49 PM    [facility] [address], [address] [place], [place]  [address] Phone: [phone]; Fax: [phone]  [address], [address] [place], [place] [address] Phone: [phone]

## 2017-02-24 NOTE — Patient Instructions (Addendum)
   [facility] [address] [place] [address] Dept: [phone] Loc: [phone]  SAIRA KRAMME  02/24/2017  You are scheduled for a CATH on Monday, December 3 with Dr. Lauree Chandler.  1. Please arrive at the [facility] (Main Entrance A) at [facility]: [address] [place], [place] [address] at 5:30 AM (two hours before your procedure to ensure your preparation). Free valet parking service is available.   Special note: Every effort is made to have your procedure done on time. Please understand that emergencies sometimes delay scheduled procedures.  2. Diet: Do not eat or drink anything after midnight prior to your procedure except sips of water to take medications.  3. Labs:  Will be drawn today. Please stop at the lab and complete.   4. Medication instructions in preparation for your procedure:     Current Outpatient Medications (Respiratory):  .  fluticasone (FLONASE) 50 MCG/ACT nasal spray, Place 1 spray daily into both nostrils.  Current Outpatient Medications (Analgesics):  .  aspirin 81 MG tablet, Take 81 mg by mouth daily.  Current Outpatient Medications (Hematological):  .  ferrous sulfate 325 (65 FE) MG tablet, Take 1 tablet (325 mg total) by mouth daily with breakfast.  Current Outpatient Medications (Other):  .  cyclobenzaprine (FLEXERIL) 10 MG tablet, TAKE 1 TABLET BY MOUTH AT BEDTIME FOR BACK PAIN AND AS SLEEP AID .  omeprazole (PRILOSEC) 20 MG capsule, TAKE 1 CAPSULE BY MOUTH EVERY DAY .  sertraline (ZOLOFT) 100 MG tablet, Take 1 tablet (100 mg total) by mouth daily. *For reference purposes while preparing patient instructions.   Delete this med list prior to printing instructions for patient.*  On the morning of your procedure, take your Aspirin and all morning medicines listed above.  You may use sips of water.  5. Plan for one night  stay--bring personal belongings. 6. Bring a current list of your medications and current insurance cards. 7. You MUST have a responsible person to drive you home. 8. Someone MUST be with you the first 24 hours after you arrive home or your discharge will be delayed. 9. Please wear clothes that are easy to get on and off and wear slip-on shoes.  Thank you for allowing [place] to care for you!   -- [place] [facility]   Your physician recommends that you schedule a follow-up appointment in: 2 weeks after Cath with Dr Stanford Breed or Bernerd Pho, PA-C  START Imdur 30 mg Take 1 tablet once a day  START Nitrostat as needed for chest pain

## 2017-02-25 ENCOUNTER — Ambulatory Visit: Payer: Medicare Other | Admitting: Physician Assistant

## 2017-02-25 ENCOUNTER — Telehealth: Payer: Self-pay

## 2017-02-25 LAB — PROTIME-INR
INR: 1 (ref 0.8–1.2)
Prothrombin Time: 10.5 s (ref 9.1–12.0)

## 2017-02-25 LAB — BASIC METABOLIC PANEL WITH GFR
BUN/Creatinine Ratio: 12 (ref 12–28)
BUN: 12 mg/dL (ref 8–27)
CO2: 24 mmol/L (ref 20–29)
Calcium: 9.7 mg/dL (ref 8.7–10.3)
Chloride: 106 mmol/L (ref 96–106)
Creatinine, Ser: 1 mg/dL (ref 0.57–1.00)
GFR calc Af Amer: 67 mL/min/1.73 (ref >59)
GFR calc non Af Amer: 58 mL/min/1.73 — ABNORMAL LOW (ref >59)
Glucose: 127 mg/dL — ABNORMAL HIGH (ref 65–99)
Potassium: 4.3 mmol/L (ref 3.5–5.2)
Sodium: 144 mmol/L (ref 134–144)

## 2017-02-25 LAB — CBC WITH DIFFERENTIAL/PLATELET
Basophils Absolute: 0 x10E3/uL (ref 0.0–0.2)
Basos: 1 %
EOS (ABSOLUTE): 0.4 x10E3/uL (ref 0.0–0.4)
Eos: 7 %
Hematocrit: 36.9 % (ref 34.0–46.6)
Hemoglobin: 11.4 g/dL (ref 11.1–15.9)
Immature Grans (Abs): 0 x10E3/uL (ref 0.0–0.1)
Immature Granulocytes: 0 %
Lymphocytes Absolute: 2.5 x10E3/uL (ref 0.7–3.1)
Lymphs: 42 %
MCH: 20.7 pg — ABNORMAL LOW (ref 26.6–33.0)
MCHC: 30.9 g/dL — ABNORMAL LOW (ref 31.5–35.7)
MCV: 67 fL — ABNORMAL LOW (ref 79–97)
Monocytes Absolute: 0.3 x10E3/uL (ref 0.1–0.9)
Monocytes: 4 %
Neutrophils Absolute: 2.8 x10E3/uL (ref 1.4–7.0)
Neutrophils: 46 %
Platelets: 407 x10E3/uL — ABNORMAL HIGH (ref 150–379)
RBC: 5.51 x10E6/uL — ABNORMAL HIGH (ref 3.77–5.28)
RDW: 17.8 % — ABNORMAL HIGH (ref 12.3–15.4)
WBC: 5.9 x10E3/uL (ref 3.4–10.8)

## 2017-02-25 LAB — TSH: TSH: 0.673 u[IU]/mL (ref 0.450–4.500)

## 2017-02-25 NOTE — Telephone Encounter (Signed)
Call placed to Pt.  Call went to VM.  No DPR on file.  Left generic message requesting call back to this nurse.  Name and # left.

## 2017-02-26 ENCOUNTER — Telehealth: Payer: Self-pay | Admitting: *Deleted

## 2017-02-26 DIAGNOSIS — E785 Hyperlipidemia, unspecified: Secondary | ICD-10-CM

## 2017-02-26 DIAGNOSIS — Z79899 Other long term (current) drug therapy: Secondary | ICD-10-CM

## 2017-02-26 MED ORDER — ATORVASTATIN CALCIUM 40 MG PO TABS: 40.0000 mg | ORAL_TABLET | Freq: Every day | ORAL | 5 refills | Status: DC

## 2017-02-26 NOTE — Telephone Encounter (Signed)
Results and recommendations discussed with patient, who verbalized understanding and thanks. (also discussed normal pre-cath labs) Pt states OK to call in atorvastatin to local pharmacy of record. Aware we would like her to return in approx 8 weeks for labwork recheck. She understands to call back if any questions or concerns.

## 2017-02-26 NOTE — Telephone Encounter (Signed)
-----   Message from [place], [place] sent at 02/16/2017 10:51 AM EST ----- Cholesterol uncontrolled, recommend 40mg  daily lipitor, recheck FLP and LFT in 8 weeks

## 2017-02-27 ENCOUNTER — Telehealth: Payer: Self-pay | Admitting: Cardiology

## 2017-02-27 NOTE — Telephone Encounter (Signed)
Received page from patient.  She has an upcoming cath for high risk positive stress test on Monday.  She has not had any further CP, but she was having difficulty tolerating her Imdur- it has been giving her intense headaches after each dose.  She was wondering whether it would be okay to skip a dose.  I advised her to take the medicine if she felt up to it, but that it was not absolutely critical that she take it.  I reiterated that she should take it easy and do no strenuous activity whatsoever prior to her planned cath on Monday.

## 2017-03-01 ENCOUNTER — Other Ambulatory Visit: Payer: Self-pay

## 2017-03-01 ENCOUNTER — Ambulatory Visit (HOSPITAL_COMMUNITY)
Admission: RE | Admit: 2017-03-01 | Discharge: 2017-03-01 | Disposition: A | Discharge status: Home / Self Care | Payer: Medicare Other | Source: Ambulatory Visit | Attending: Cardiovascular Disease | Admitting: Cardiovascular Disease

## 2017-03-01 ENCOUNTER — Encounter (HOSPITAL_COMMUNITY)
Admission: RE | Disposition: A | Discharge status: Home / Self Care | Payer: Self-pay | Source: Ambulatory Visit | Attending: Cardiovascular Disease

## 2017-03-01 DIAGNOSIS — Z87891 Personal history of nicotine dependence: Secondary | ICD-10-CM | POA: Diagnosis not present

## 2017-03-01 DIAGNOSIS — Z79899 Other long term (current) drug therapy: Secondary | ICD-10-CM | POA: Diagnosis not present

## 2017-03-01 DIAGNOSIS — I25110 Atherosclerotic heart disease of native coronary artery with unstable angina pectoris: Secondary | ICD-10-CM | POA: Insufficient documentation

## 2017-03-01 DIAGNOSIS — R9439 Abnormal result of other cardiovascular function study: Secondary | ICD-10-CM | POA: Insufficient documentation

## 2017-03-01 DIAGNOSIS — R079 Chest pain, unspecified: Secondary | ICD-10-CM

## 2017-03-01 DIAGNOSIS — E785 Hyperlipidemia, unspecified: Secondary | ICD-10-CM | POA: Insufficient documentation

## 2017-03-01 DIAGNOSIS — Z7982 Long term (current) use of aspirin: Secondary | ICD-10-CM | POA: Insufficient documentation

## 2017-03-01 DIAGNOSIS — I10 Essential (primary) hypertension: Secondary | ICD-10-CM | POA: Diagnosis not present

## 2017-03-01 DIAGNOSIS — I200 Unstable angina: Secondary | ICD-10-CM

## 2017-03-01 DIAGNOSIS — Z8249 Family history of ischemic heart disease and other diseases of the circulatory system: Secondary | ICD-10-CM | POA: Diagnosis not present

## 2017-03-01 DIAGNOSIS — Z955 Presence of coronary angioplasty implant and graft: Secondary | ICD-10-CM

## 2017-03-01 HISTORY — PX: LEFT HEART CATH AND CORONARY ANGIOGRAPHY: CATH118249

## 2017-03-01 HISTORY — PX: CORONARY STENT INTERVENTION: CATH118234

## 2017-03-01 HISTORY — PX: CORONARY PRESSURE/FFR STUDY: CATH118243

## 2017-03-01 LAB — POCT ACTIVATED CLOTTING TIME
ACTIVATED CLOTTING TIME: 252 s
ACTIVATED CLOTTING TIME: 593 s

## 2017-03-01 SURGERY — LEFT HEART CATH AND CORONARY ANGIOGRAPHY
Anesthesia: LOCAL

## 2017-03-01 MED ORDER — ANGIOPLASTY BOOK
Freq: Once | Status: DC
  Filled 2017-03-01: qty 1

## 2017-03-01 MED ORDER — HYDRALAZINE HCL 20 MG/ML IJ SOLN
INTRAMUSCULAR | Status: AC
  Administered 2017-03-01: 5 mg via INTRAVENOUS
  Filled 2017-03-01: qty 1

## 2017-03-01 MED ORDER — MIDAZOLAM HCL 2 MG/2ML IJ SOLN
INTRAMUSCULAR | Status: AC
  Filled 2017-03-01: qty 2

## 2017-03-01 MED ORDER — SODIUM CHLORIDE 0.9 % WEIGHT BASED INFUSION
3.0000 mL/kg/h | INTRAVENOUS | Status: DC
  Administered 2017-03-01: 3 mL/kg/h via INTRAVENOUS

## 2017-03-01 MED ORDER — HEPARIN (PORCINE) IN NACL 2-0.9 UNIT/ML-% IJ SOLN
INTRAMUSCULAR | Status: DC | PRN
  Administered 2017-03-01: 10 mL via INTRA_ARTERIAL

## 2017-03-01 MED ORDER — ASPIRIN 81 MG PO CHEW
CHEWABLE_TABLET | ORAL | Status: AC
  Administered 2017-03-01: 81 mg via ORAL
  Filled 2017-03-01: qty 1

## 2017-03-01 MED ORDER — IBUPROFEN 800 MG PO TABS: 400.0000 mg | ORAL_TABLET | Freq: Every day | ORAL | Status: DC | PRN

## 2017-03-01 MED ORDER — SODIUM CHLORIDE 0.9% FLUSH: 3.0000 mL | Freq: Two times a day (BID) | INTRAVENOUS | Status: DC

## 2017-03-01 MED ORDER — GI COCKTAIL ~~LOC~~
30.0000 mL | Freq: Once | ORAL | Status: AC
  Administered 2017-03-01: 30 mL via ORAL
  Filled 2017-03-01: qty 30

## 2017-03-01 MED ORDER — SODIUM CHLORIDE 0.9 % WEIGHT BASED INFUSION: 1.0000 mL/kg/h | INTRAVENOUS | Status: DC

## 2017-03-01 MED ORDER — CLOPIDOGREL BISULFATE 75 MG PO TABS: 75.0000 mg | ORAL_TABLET | Freq: Every day | ORAL | 0 refills | Status: DC

## 2017-03-01 MED ORDER — ADENOSINE (DIAGNOSTIC) 140MCG/KG/MIN
INTRAVENOUS | Status: DC | PRN
  Administered 2017-03-01: 140 ug/kg/min via INTRAVENOUS

## 2017-03-01 MED ORDER — LIDOCAINE HCL (PF) 1 % IJ SOLN
INTRAMUSCULAR | Status: DC | PRN
  Administered 2017-03-01: 2 mL

## 2017-03-01 MED ORDER — FLUTICASONE PROPIONATE 50 MCG/ACT NA SUSP: 1.0000 | Freq: Every day | NASAL | Status: DC

## 2017-03-01 MED ORDER — PANTOPRAZOLE SODIUM 40 MG PO TBEC: 40.0000 mg | DELAYED_RELEASE_TABLET | Freq: Every day | ORAL | 2 refills | Status: DC

## 2017-03-01 MED ORDER — PANTOPRAZOLE SODIUM 40 MG PO TBEC: 40.0000 mg | DELAYED_RELEASE_TABLET | Freq: Every day | ORAL | Status: DC

## 2017-03-01 MED ORDER — SODIUM CHLORIDE 0.9% FLUSH: 3.0000 mL | INTRAVENOUS | Status: DC | PRN

## 2017-03-01 MED ORDER — HEPARIN (PORCINE) IN NACL 2-0.9 UNIT/ML-% IJ SOLN
INTRAMUSCULAR | Status: AC
  Filled 2017-03-01: qty 1000

## 2017-03-01 MED ORDER — LIDOCAINE HCL (PF) 1 % IJ SOLN
INTRAMUSCULAR | Status: AC
  Filled 2017-03-01: qty 30

## 2017-03-01 MED ORDER — LABETALOL HCL 5 MG/ML IV SOLN
10.0000 mg | INTRAVENOUS | Status: AC | PRN
  Administered 2017-03-01: 10 mg via INTRAVENOUS

## 2017-03-01 MED ORDER — METOPROLOL TARTRATE 12.5 MG HALF TABLET
12.5000 mg | ORAL_TABLET | Freq: Once | ORAL | Status: AC
  Administered 2017-03-01: 12.5 mg via ORAL
  Filled 2017-03-01: qty 1

## 2017-03-01 MED ORDER — IOPAMIDOL (ISOVUE-370) INJECTION 76%
INTRAVENOUS | Status: AC
  Filled 2017-03-01: qty 100

## 2017-03-01 MED ORDER — SODIUM CHLORIDE 0.9 % IV SOLN: 250.0000 mL | INTRAVENOUS | Status: DC | PRN

## 2017-03-01 MED ORDER — HEPARIN (PORCINE) IN NACL 2-0.9 UNIT/ML-% IJ SOLN
INTRAMUSCULAR | Status: AC | PRN
  Administered 2017-03-01: 1000 mL

## 2017-03-01 MED ORDER — SODIUM CHLORIDE 0.9 % IV SOLN: INTRAVENOUS | Status: AC

## 2017-03-01 MED ORDER — SERTRALINE HCL 100 MG PO TABS: 100.0000 mg | ORAL_TABLET | Freq: Every day | ORAL | Status: DC

## 2017-03-01 MED ORDER — ADENOSINE 12 MG/4ML IV SOLN
INTRAVENOUS | Status: AC
  Filled 2017-03-01: qty 16

## 2017-03-01 MED ORDER — ONDANSETRON HCL 4 MG/2ML IJ SOLN: 4.0000 mg | Freq: Four times a day (QID) | INTRAMUSCULAR | Status: DC | PRN

## 2017-03-01 MED ORDER — LABETALOL HCL 5 MG/ML IV SOLN
INTRAVENOUS | Status: AC
  Filled 2017-03-01: qty 4

## 2017-03-01 MED ORDER — IOPAMIDOL (ISOVUE-370) INJECTION 76%
INTRAVENOUS | Status: AC
  Filled 2017-03-01: qty 50

## 2017-03-01 MED ORDER — FENTANYL CITRATE (PF) 100 MCG/2ML IJ SOLN
INTRAMUSCULAR | Status: AC
  Filled 2017-03-01: qty 2

## 2017-03-01 MED ORDER — METOPROLOL TARTRATE 25 MG PO TABS: 12.5000 mg | ORAL_TABLET | Freq: Two times a day (BID) | ORAL | 0 refills | Status: DC

## 2017-03-01 MED ORDER — HYDRALAZINE HCL 20 MG/ML IJ SOLN
5.0000 mg | INTRAMUSCULAR | Status: AC | PRN
  Administered 2017-03-01 (×2): 5 mg via INTRAVENOUS

## 2017-03-01 MED ORDER — FERROUS SULFATE 325 (65 FE) MG PO TABS: 325.0000 mg | ORAL_TABLET | Freq: Every day | ORAL | Status: DC

## 2017-03-01 MED ORDER — CLOPIDOGREL BISULFATE 300 MG PO TABS
ORAL_TABLET | ORAL | Status: DC | PRN
  Administered 2017-03-01: 600 mg via ORAL

## 2017-03-01 MED ORDER — ASPIRIN 81 MG PO TABS: 81.0000 mg | ORAL_TABLET | Freq: Every day | ORAL | Status: DC

## 2017-03-01 MED ORDER — CLOPIDOGREL BISULFATE 75 MG PO TABS: 75.0000 mg | ORAL_TABLET | Freq: Every day | ORAL | Status: DC

## 2017-03-01 MED ORDER — HEPARIN SODIUM (PORCINE) 1000 UNIT/ML IJ SOLN
INTRAMUSCULAR | Status: AC
  Filled 2017-03-01: qty 1

## 2017-03-01 MED ORDER — IOPAMIDOL (ISOVUE-370) INJECTION 76%
INTRAVENOUS | Status: DC | PRN
  Administered 2017-03-01: 175 mL via INTRA_ARTERIAL

## 2017-03-01 MED ORDER — ACETAMINOPHEN 325 MG PO TABS: 650.0000 mg | ORAL_TABLET | ORAL | Status: DC | PRN

## 2017-03-01 MED ORDER — CLOPIDOGREL BISULFATE 300 MG PO TABS
ORAL_TABLET | ORAL | Status: AC
  Filled 2017-03-01: qty 2

## 2017-03-01 MED ORDER — HEPARIN SODIUM (PORCINE) 1000 UNIT/ML IJ SOLN
INTRAMUSCULAR | Status: DC | PRN
  Administered 2017-03-01: 5000 [IU] via INTRAVENOUS
  Administered 2017-03-01: 4000 [IU] via INTRAVENOUS
  Administered 2017-03-01: 3000 [IU] via INTRAVENOUS

## 2017-03-01 MED ORDER — ATORVASTATIN CALCIUM 40 MG PO TABS: 40.0000 mg | ORAL_TABLET | Freq: Every day | ORAL | Status: DC

## 2017-03-01 MED ORDER — ISOSORBIDE MONONITRATE ER 30 MG PO TB24: 30.0000 mg | ORAL_TABLET | Freq: Every day | ORAL | Status: DC

## 2017-03-01 MED ORDER — FENTANYL CITRATE (PF) 100 MCG/2ML IJ SOLN
INTRAMUSCULAR | Status: DC | PRN
  Administered 2017-03-01 (×3): 25 ug via INTRAVENOUS

## 2017-03-01 MED ORDER — NITROGLYCERIN 0.4 MG SL SUBL: 0.4000 mg | SUBLINGUAL_TABLET | SUBLINGUAL | Status: DC | PRN

## 2017-03-01 MED ORDER — ASPIRIN 81 MG PO CHEW
81.0000 mg | CHEWABLE_TABLET | ORAL | Status: AC
  Administered 2017-03-01: 81 mg via ORAL

## 2017-03-01 MED ORDER — MIDAZOLAM HCL 2 MG/2ML IJ SOLN
INTRAMUSCULAR | Status: DC | PRN
  Administered 2017-03-01: 1 mg via INTRAVENOUS
  Administered 2017-03-01: 2 mg via INTRAVENOUS
  Administered 2017-03-01: 1 mg via INTRAVENOUS

## 2017-03-01 MED ORDER — VERAPAMIL HCL 2.5 MG/ML IV SOLN
INTRAVENOUS | Status: AC
  Filled 2017-03-01: qty 2

## 2017-03-01 MED FILL — CLOPIDOGREL 75 MG TABLET: 75 | 30 days supply | Qty: 30 | Fill #0

## 2017-03-01 SURGICAL SUPPLY — 19 items
BALLN EMERGE MR 2.5X8 (BALLOONS) ×2
BALLN SAPPHIRE ~~LOC~~ 3.0X8 (BALLOONS) ×1 IMPLANT
BALLOON EMERGE MR 2.5X8 (BALLOONS) IMPLANT
CATH 5FR JL3.5 JR4 ANG PIG MP (CATHETERS) ×1 IMPLANT
CATH MICROCATH NAVVUS (MICROCATHETER) IMPLANT
CATH VISTA GUIDE 6FR XBLAD3.5 (CATHETERS) ×1 IMPLANT
DEVICE RAD COMP TR BAND LRG (VASCULAR PRODUCTS) ×1 IMPLANT
GLIDESHEATH SLEND SS 6F .021 (SHEATH) ×1 IMPLANT
GUIDEWIRE INQWIRE 1.5J.035X260 (WIRE) IMPLANT
INQWIRE 1.5J .035X260CM (WIRE) ×2
KIT ENCORE 26 ADVANTAGE (KITS) ×1 IMPLANT
KIT HEART LEFT (KITS) ×2 IMPLANT
MICROCATHETER NAVVUS (MICROCATHETER) ×2
PACK CARDIAC CATHETERIZATION (CUSTOM PROCEDURE TRAY) ×2 IMPLANT
STENT PROMUS PREM MR 3.0X12 (Permanent Stent) ×1 IMPLANT
SYR MEDRAD MARK V 150ML (SYRINGE) ×2 IMPLANT
TRANSDUCER W/STOPCOCK (MISCELLANEOUS) ×2 IMPLANT
TUBING CIL FLEX 10 FLL-RA (TUBING) ×2 IMPLANT
WIRE COUGAR XT STRL 190CM (WIRE) ×1 IMPLANT

## 2017-03-01 NOTE — Discharge Summary (Signed)
Discharge Summary/Same Day PCI    Patient ID: [name] [name],  MRN: [id number], DOB/AGE: [birth year] 66 y.o.  Admit date: 03/01/2017 Discharge date: 03/02/2017  Primary Care Provider: Beaulah Dinning Primary Cardiologist: Jens Som  Discharge Diagnoses    Active Problems:   Unstable angina [facility])   Allergies No Known Allergies  Diagnostic Studies/Procedures    Cath: 03/01/17  Conclusion     Mid RCA lesion is 20% stenosed.  Ost Ramus lesion is 80% stenosed.  A drug-eluting stent was successfully placed using a STENT PROMUS PREM MR 3.0X12.  Post intervention, there is a 0% residual stenosis.  The left ventricular systolic function is normal.  LV end diastolic pressure is normal.  The left ventricular ejection fraction is 50-55% by visual estimate.  There is no mitral valve regurgitation.   1. Unstable angina 2. Severe stenosis ostium of the Ramus Intermediate branch 3. Successful PTCA/DES x 1 ostium of the intermediate branch  Recommendations: DAPT with ASA and Plavix for one year. Continue statin. Start beta blocker. D/C home today after 6 hours bedrest per same day PCI protocol.   _____________   History of Present Illness      66 y.o. female with past medical history of HTN, HLD and MVP.  She was recently evaluated by [name] Course, PA-C on 02/11/2017 and reported having worsening chest discomfort and dyspnea on exertion for the past several months. Pain was exacerbated by activity and relieved with rest. EKG at that time showed no significant ST or T-wave changes and a Treadmill Myoview was recommended for further evaluation. This was performed on 02/23/2017 and showed ST segment depression of 4 mm during stress in the II, III, aVF, V6, V5 and V4 leads. A large size, moderate intensity reversible anterior perfusion defect and moderate size, partially reversible lateral perfusion defect suggestive of ischemia was noted, overall being a high-risk study.    She presented back to the office on 11/28 with [name] and reported having chest pain during her stress test but said this resolved with rest.   She had no known history of CAD and had never undergone a cath before but was familiar with the procedure as her mother and father both had CAD and had catheterizations. Unaware of any history of contrast allergies. Given her symptoms and abnormal stress test she was set up for outpatient cardiac cath.   [name]     Underwent cardiac cath with Dr. Clifton James noted above with successful PTCA/DES x1 to ostium of intermediate branch. Plan for DAPT with ASA/plavix for at least one year. Plan to continue on statin, and started low dose metoprolol. No complications noted post cath. Radial site stable. Blood pressure was elevated, therefore BB was started prior to discharge.  Instructions/precautions given prior to discharge.   [name] was seen by Dr. Clifton James and determined stable for discharge home. Follow up in the office has been arranged. Medications are listed below.   _____________  Discharge Vitals Blood pressure (!) 146/77, pulse 64, resp. rate 19, SpO2 100 %.  There were no vitals filed for this visit.  Labs & Radiologic Studies    CBC No results for input(s): WBC, NEUTROABS, HGB, HCT, MCV, PLT in the last 72 hours. Basic Metabolic Panel No results for input(s): NA, K, CL, CO2, GLUCOSE, BUN, CREATININE, CALCIUM, MG, PHOS in the last 72 hours. Liver Function Tests No results for input(s): AST, ALT, ALKPHOS, BILITOT, PROT, ALBUMIN in the last 72 hours. No results  for input(s): LIPASE, AMYLASE in the last 72 hours. Cardiac Enzymes No results for input(s): CKTOTAL, CKMB, CKMBINDEX, TROPONINI in the last 72 hours. BNP Invalid input(s): POCBNP D-Dimer No results for input(s): DDIMER in the last 72 hours. Hemoglobin A1C No results for input(s): HGBA1C in the last 72 hours. Fasting Lipid Panel No results for  input(s): CHOL, HDL, LDLCALC, TRIG, CHOLHDL, LDLDIRECT in the last 72 hours. Thyroid Function Tests No results for input(s): TSH, T4TOTAL, T3FREE, THYROIDAB in the last 72 hours.  Invalid input(s): FREET3 _____________  No results found. Disposition   Pt is being discharged home today in good condition.  Follow-up Plans & Appointments    Follow-up Information    Ellsworth Lennox, PA-C Follow up on 03/15/2017.   Specialties:  Physician Assistant, Cardiology Why:  at 2:30pm for your follow up appt.  Contact information: [address] [place] [address] [phone]          Discharge Instructions    Amb Referral to Cardiac Rehabilitation   Complete by:  As directed    Diagnosis:   PTCA Coronary Stents        Discharge Medications     Medication List    STOP taking these medications   ibuprofen 200 MG tablet Commonly known as:  ADVIL,MOTRIN   omeprazole 20 MG capsule Commonly known as:  PRILOSEC Replaced by:  pantoprazole 40 MG tablet     TAKE these medications   aspirin 81 MG tablet Take 81 mg by mouth daily.   atorvastatin 40 MG tablet Commonly known as:  LIPITOR Take 1 tablet (40 mg total) by mouth daily.   clopidogrel 75 MG tablet Commonly known as:  PLAVIX Take 1 tablet (75 mg total) by mouth daily with breakfast.   cyclobenzaprine 10 MG tablet Commonly known as:  FLEXERIL TAKE 10 MG BY MOUTH AT BEDTIME FOR BACK PAIN AND AS SLEEP AID   ferrous sulfate 325 (65 FE) MG tablet Take 1 tablet (325 mg total) by mouth daily with breakfast.   fluticasone 50 MCG/ACT nasal spray Commonly known as:  FLONASE Place 1 spray daily into both nostrils.   isosorbide mononitrate 30 MG 24 hr tablet Commonly known as:  IMDUR Take 1 tablet (30 mg total) by mouth daily.   metoprolol tartrate 25 MG tablet Commonly known as:  LOPRESSOR Take 0.5 tablets (12.5 mg total) by mouth 2 (two) times daily.   nitroGLYCERIN 0.4 MG SL tablet Commonly  known as:  NITROSTAT Place 1 tablet (0.4 mg total) under the tongue every 5 (five) minutes as needed for chest pain.   pantoprazole 40 MG tablet Commonly known as:  PROTONIX Take 1 tablet (40 mg total) by mouth daily. Replaces:  omeprazole 20 MG capsule   sertraline 100 MG tablet Commonly known as:  ZOLOFT Take 1 tablet (100 mg total) by mouth daily.         Outstanding Labs/Studies   N/a   Duration of Discharge Encounter   Greater than 30 minutes including physician time.  Signed, Laverda Page NP-C 03/02/2017, 7:39 AM

## 2017-03-01 NOTE — Progress Notes (Signed)
TRB removed, 2x2 gauze with tegaderm/armboard placed, site WNL.

## 2017-03-01 NOTE — Progress Notes (Signed)
Report received from Mason in to see client and orders noted

## 2017-03-01 NOTE — Discharge Instructions (Signed)
Radial Site Care °Refer to this sheet in the next few weeks. These instructions provide you with information about caring for yourself after your procedure. Your health care provider may also give you more specific instructions. Your treatment has been planned according to current medical practices, but problems sometimes occur. Call your health care provider if you have any problems or questions after your procedure. °What can I expect after the procedure? °After your procedure, it is typical to have the following: °· Bruising at the radial site that usually fades within 1-2 weeks. °· Blood collecting in the tissue (hematoma) that may be painful to the touch. It should usually decrease in size and tenderness within 1-2 weeks. ° °Follow these instructions at home: °· Take medicines only as directed by your health care provider. °· You may shower 24-48 hours after the procedure or as directed by your health care provider. Remove the bandage (dressing) and gently wash the site with plain soap and water. Pat the area dry with a clean towel. Do not rub the site, because this may cause bleeding. °· Do not take baths, swim, or use a hot tub until your health care provider approves. °· Check your insertion site every day for redness, swelling, or drainage. °· Do not apply powder or lotion to the site. °· Do not flex or bend the affected arm for 24 hours or as directed by your health care provider. °· Do not push or pull heavy objects with the affected arm for 24 hours or as directed by your health care provider. °· Do not lift over 10 lb (4.5 kg) for 5 days after your procedure or as directed by your health care provider. °· Ask your health care provider when it is okay to: °? Return to work or school. °? Resume usual physical activities or sports. °? Resume sexual activity. °· Do not drive home if you are discharged the same day as the procedure. Have someone else drive you. °· You may drive 24 hours after the procedure  unless otherwise instructed by your health care provider. °· Do not operate machinery or power tools for 24 hours after the procedure. °· If your procedure was done as an outpatient procedure, which means that you went home the same day as your procedure, a responsible adult should be with you for the first 24 hours after you arrive home. °· Keep all follow-up visits as directed by your health care provider. This is important. °Contact a health care provider if: °· You have a fever. °· You have chills. °· You have increased bleeding from the radial site. Hold pressure on the site. °Get help right away if: °· You have unusual pain at the radial site. °· You have redness, warmth, or swelling at the radial site. °· You have drainage (other than a small amount of blood on the dressing) from the radial site. °· The radial site is bleeding, and the bleeding does not stop after 30 minutes of holding steady pressure on the site. °· Your arm or hand becomes pale, cool, tingly, or numb. °This information is not intended to replace advice given to you by your health care provider. Make sure you discuss any questions you have with your health care provider. °Document Released: 04/18/2010 Document Revised: 08/22/2015 Document Reviewed: 10/02/2013 °Elsevier Interactive Patient Education © 2018 [employer] ° ° ° °Moderate Conscious Sedation, Adult, Care After °These instructions provide you with information about caring for yourself after your procedure. Your health care provider   may also give you more specific instructions. Your treatment has been planned according to current medical practices, but problems sometimes occur. Call your health care provider if you have any problems or questions after your procedure. °What can I expect after the procedure? °After your procedure, it is common: °· To feel sleepy for several hours. °· To feel clumsy and have poor balance for several hours. °· To have poor judgment for several  hours. °· To vomit if you eat too soon. ° °Follow these instructions at home: °For at least 24 hours after the procedure: ° °· Do not: °? Participate in activities where you could fall or become injured. °? Drive. °? Use heavy machinery. °? Drink alcohol. °? Take sleeping pills or medicines that cause drowsiness. °? Make important decisions or sign legal documents. °? Take care of children on your own. °· Rest. °Eating and drinking °· Follow the diet recommended by your health care provider. °· If you vomit: °? Drink water, juice, or soup when you can drink without vomiting. °? Make sure you have little or no nausea before eating solid foods. °General instructions °· Have a responsible adult stay with you until you are awake and alert. °· Take over-the-counter and prescription medicines only as told by your health care provider. °· If you smoke, do not smoke without supervision. °· Keep all follow-up visits as told by your health care provider. This is important. °Contact a health care provider if: °· You keep feeling nauseous or you keep vomiting. °· You feel light-headed. °· You develop a rash. °· You have a fever. °Get help right away if: °· You have trouble breathing. °This information is not intended to replace advice given to you by your health care provider. Make sure you discuss any questions you have with your health care provider. °Document Released: 01/04/2013 Document Revised: 08/19/2015 Document Reviewed: 07/06/2015 °Elsevier Interactive Patient Education © 2018 [employer] ° °

## 2017-03-01 NOTE — Progress Notes (Signed)
Discussed stent, Plavix, restrictions, diet, ex, NTG, and CRPII. Voiced understanding although pt is sleepy. Husband present and voiced understanding as well. Will refer to [place]. Pt quit smoking. [place], ACSM 12:11 PM 03/01/2017

## 2017-03-01 NOTE — Interval H&P Note (Signed)
History and Physical Interval Note:  03/01/2017 7:34 AM  [name] [name]  has presented today for cardiac cath  with the diagnosis of abnormal stress test, unstable angina.  The various methods of treatment have been discussed with the patient and family. After consideration of risks, benefits and other options for treatment, the patient has consented to  Procedure(s): LEFT HEART CATH AND CORONARY ANGIOGRAPHY (N/A) as a surgical intervention .  The patient's history has been reviewed, patient examined, no change in status, stable for surgery.  I have reviewed the patient's chart and labs.  Questions were answered to the patient's satisfaction.    Cath Lab Visit (complete for each Cath Lab visit)  Clinical Evaluation Leading to the Procedure:   ACS: No.  Non-ACS:    Anginal Classification: CCS III  Anti-ischemic medical therapy: Minimal Therapy (1 class of medications)  Non-Invasive Test Results: High-risk stress test findings: cardiac mortality >3%/year  Prior CABG: No previous CABG         Lauree Chandler

## 2017-03-02 ENCOUNTER — Encounter (HOSPITAL_COMMUNITY): Payer: Self-pay | Admitting: Cardiovascular Disease

## 2017-03-04 ENCOUNTER — Telehealth (HOSPITAL_COMMUNITY): Payer: Self-pay

## 2017-03-04 NOTE — Telephone Encounter (Signed)
Attempted to call patient in regards to Cardiac Rehab and insurance - Lm on Vm.

## 2017-03-05 ENCOUNTER — Telehealth (HOSPITAL_COMMUNITY): Payer: Self-pay

## 2017-03-05 ENCOUNTER — Other Ambulatory Visit: Payer: Self-pay | Admitting: Physician Assistant

## 2017-03-05 NOTE — Progress Notes (Signed)
Patient called after our answering service complaining of severe headache.  Will discontinue Imdur, especially in light of recent PCI.  Patient no longer need Imdur by this point.  I have advised the patient to take Tylenol on an as-needed basis for headache.  Hilbert Corrigan PA Pager: [phone]

## 2017-03-05 NOTE — Telephone Encounter (Signed)
Patient's insurance is active and benefits verified through [facility] - No co-pay, no deductible, out of pocket amount of $3,200/$180.34 has been met, 10% co-insurance, and no pre-authorization is required. Passport/reference [phone]  Patient will be contacted and scheduled after review by the RN Navigator.

## 2017-03-10 ENCOUNTER — Telehealth (HOSPITAL_COMMUNITY): Payer: Self-pay

## 2017-03-10 NOTE — Telephone Encounter (Signed)
Called and spoke with patient in regards to Cardiac Rehab - Scheduled orientation on 04/13/2017 at 1:30pm. Patient will attend the 1:15pm exc class.

## 2017-03-11 ENCOUNTER — Telehealth: Payer: Self-pay | Admitting: Cardiology

## 2017-03-11 NOTE — Telephone Encounter (Signed)
Spoke with patient and she had chest pain Monday used NTG and resolved. Chest pain did return this am. She did lay down and went back to sleep. No chest pain currently. Chest pain similar but not as bad as before stent on 03/01/17. Offered appointment today with Dr Stanford Breed but patient taking her mother to cardiology appointment. Scheduled appointment for tomorrow with Dr Stanford Breed and advised to go ED if chest pain returns without resolution, verbalized understanding.

## 2017-03-11 NOTE — Progress Notes (Signed)
HPI: fu CAD. Also H/O MVP and orthostasis. Carotid Dopplers 2010 showed no significant stenosis.Echocardiogram repeated September 2016 and showed normal LV function. Patient had chest pain in November followed by abnormal nuclear study. Patient had cardiac catheterization December 2018 that showed an 80% ostial ramus and ejection fraction 50-55%. Patient had PCI with drug-eluting stent. Since last seen, She denies dyspnea. She had an episode of chest tightness yesterday morning. It is diffuse without radiation. No associated symptoms. Resolved after 15-30 seconds spontaneously. She does not have exertional chest pain.  Current Outpatient Medications  Medication Sig Dispense Refill  . aspirin 81 MG tablet Take 81 mg by mouth daily.    [name] atorvastatin (LIPITOR) 40 MG tablet Take 1 tablet (40 mg total) by mouth daily. 30 tablet 5  . clopidogrel (PLAVIX) 75 MG tablet Take 1 tablet (75 mg total) by mouth daily with breakfast. 30 tablet 0  . cyclobenzaprine (FLEXERIL) 10 MG tablet TAKE 10 MG BY MOUTH AT BEDTIME FOR BACK PAIN AND AS SLEEP AID  1  . ferrous sulfate 325 (65 FE) MG tablet Take 1 tablet (325 mg total) by mouth daily with breakfast. 30 tablet 3  . fluticasone (FLONASE) 50 MCG/ACT nasal spray Place 1 spray daily into both nostrils.    . metoprolol tartrate (LOPRESSOR) 25 MG tablet Take 0.5 tablets (12.5 mg total) by mouth 2 (two) times daily. 60 tablet 0  . nitroGLYCERIN (NITROSTAT) 0.4 MG SL tablet Place 1 tablet (0.4 mg total) under the tongue every 5 (five) minutes as needed for chest pain. 90 tablet 3  . pantoprazole (PROTONIX) 40 MG tablet Take 1 tablet (40 mg total) by mouth daily. 30 tablet 2  . sertraline (ZOLOFT) 100 MG tablet Take 1 tablet (100 mg total) by mouth daily. 30 tablet 3   No current facility-administered medications for this visit.      Past Medical History:  Diagnosis Date  . Depression   . Hyperlipidemia   . Hypertension   . Microcytic anemia   . Mitral  valve prolapse     Past Surgical History:  Procedure Laterality Date  . CORONARY STENT INTERVENTION N/A 03/01/2017   Procedure: CORONARY STENT INTERVENTION;  Surgeon: Burnell Blanks, MD;  Location: [place] [facility];  Service: Cardiovascular;  Laterality: N/A;  . INTRAVASCULAR PRESSURE WIRE/FFR STUDY N/A 03/01/2017   Procedure: INTRAVASCULAR PRESSURE WIRE/FFR STUDY;  Surgeon: Burnell Blanks, MD;  Location: [place] [facility];  Service: Cardiovascular;  Laterality: N/A;  . LEFT HEART CATH AND CORONARY ANGIOGRAPHY N/A 03/01/2017   Procedure: LEFT HEART CATH AND CORONARY ANGIOGRAPHY;  Surgeon: Burnell Blanks, MD;  Location: [place] [facility];  Service: Cardiovascular;  Laterality: N/A;  . PARTIAL HYSTERECTOMY      Social History   Socioeconomic History  . Marital status: Married    Spouse name: Not on file  . Number of children: 3  . Years of education: Not on file  . Highest education level: Not on file  Social Needs  . Financial resource strain: Not on file  . Food insecurity - worry: Not on file  . Food insecurity - inability: Not on file  . Transportation needs - medical: Not on file  . Transportation needs - non-medical: Not on file  Occupational History    Comment: Retired  Tobacco Use  . Smoking status: Former Smoker    Packs/day: 0.50    Years: 20.00    Pack years: 10.00    Last  attempt to quit: 07/30/2004    Years since quitting: 12.6  . Smokeless tobacco: Never Used  Substance and Sexual Activity  . Alcohol use: Yes    Alcohol/week: 0.0 oz    Comment: Occasional  . Drug use: No  . Sexual activity: Yes    Partners: Male  Other Topics Concern  . Not on file  Social History Narrative  . Not on file    Family History  Problem Relation Age of Onset  . Heart disease Mother        CHF  . Diabetes Mother   . Hypertension Mother   . Cancer Father   . Hypertension Sister   . Pancreatitis Sister   . Dementia Sister     ROS: no fevers  or chills, productive cough, hemoptysis, dysphasia, odynophagia, melena, hematochezia, dysuria, hematuria, rash, seizure activity, orthopnea, PND, pedal edema, claudication. Remaining systems are negative.  Physical Exam: Well-developed well-nourished in no acute distress.  Skin is warm and dry.  HEENT is normal.  Neck is supple.  Chest is clear to auscultation with normal expansion.  Cardiovascular exam is regular rate and rhythm.  Abdominal exam nontender or distended. No masses palpated. Extremities show no edema. neuro grossly intact  ECG- sinus rhythm at a rate of 63. No ST changes. personally reviewed  A/P  1 coronary artery disease-continue aspirin, Plavix and statin. Patient had one episode of chest tightness for 15-30 seconds. Symptoms are atypical. Electrocardiogram shows no ST changes. Plan observation for now.  2 hypertension-blood pressure is mildly elevated. However she does have a history of orthostasis and I will not advance regimen at this point.  3 hyperlipidemia-continue statin. Check lipids and liver in 4 weeks.  4 history of orthostatic hypotension-one brief episode of dizziness after standing but no syncope. Continue vigorous hydration.  5 history of mitral valve prolapse-no significant mitral regurgitation on last echocardiogram.  6 microcytic anemia-follow-up gastroenterology.  [name], MD

## 2017-03-11 NOTE — Telephone Encounter (Signed)
New Message     Pt c/o of Chest Pain: STAT if CP now or developed within 24 hours  1. Are you having CP right now? no  2. Are you experiencing any other symptoms (ex. SOB, nausea, vomiting, sweating)? no  3. How long have you been experiencing CP? Yes, patient experienced some pain this morning along with tightness 4. Is your CP continuous or coming and going? no  5. Have you taken Nitroglycerin? Not since Monday 03/08/2017  Patient recently had a heart cath/stent on 03/01/2017

## 2017-03-12 ENCOUNTER — Ambulatory Visit: Payer: Medicare Other | Admitting: Cardiology

## 2017-03-12 ENCOUNTER — Encounter: Payer: Self-pay | Admitting: Cardiology

## 2017-03-12 VITALS — BP 138/66 | HR 63 | Ht 62.0 in | Wt 173.4 lb

## 2017-03-12 DIAGNOSIS — R079 Chest pain, unspecified: Secondary | ICD-10-CM

## 2017-03-12 DIAGNOSIS — I10 Essential (primary) hypertension: Secondary | ICD-10-CM | POA: Diagnosis not present

## 2017-03-12 DIAGNOSIS — I059 Rheumatic mitral valve disease, unspecified: Secondary | ICD-10-CM | POA: Diagnosis not present

## 2017-03-12 DIAGNOSIS — I2510 Atherosclerotic heart disease of native coronary artery without angina pectoris: Secondary | ICD-10-CM

## 2017-03-12 DIAGNOSIS — E7800 Pure hypercholesterolemia, unspecified: Secondary | ICD-10-CM

## 2017-03-12 NOTE — Patient Instructions (Signed)
Fasting lab work  Lipid and Liver panels in 4 weeks    Keep appointment with Dr.Crenshaw  Tuesday 05/18/17 at 11:20 am

## 2017-03-13 ENCOUNTER — Other Ambulatory Visit: Payer: Self-pay | Admitting: Family Medicine

## 2017-03-13 DIAGNOSIS — M542 Cervicalgia: Secondary | ICD-10-CM

## 2017-03-15 ENCOUNTER — Ambulatory Visit: Payer: Medicare Other | Admitting: Student

## 2017-03-19 ENCOUNTER — Other Ambulatory Visit: Payer: Self-pay | Admitting: *Deleted

## 2017-03-19 MED ORDER — NITROGLYCERIN 0.4 MG SL SUBL: 0.4000 mg | SUBLINGUAL_TABLET | SUBLINGUAL | 5 refills | Status: DC | PRN

## 2017-03-19 NOTE — Telephone Encounter (Signed)
E sent prescription - #25 tablets refill x6

## 2017-04-01 ENCOUNTER — Other Ambulatory Visit: Payer: Self-pay

## 2017-04-01 ENCOUNTER — Telehealth: Payer: Self-pay | Admitting: Cardiology

## 2017-04-01 MED ORDER — CLOPIDOGREL BISULFATE 75 MG PO TABS: 75.0000 mg | ORAL_TABLET | Freq: Every day | ORAL | 1 refills | Status: DC

## 2017-04-01 NOTE — Telephone Encounter (Signed)
Returned call, pt aware I have submitted her refill authorization as requested. I also confirmed her return OV as well as previsit labwork instructions. She voiced acknowledgment and thanks.

## 2017-04-01 NOTE — Telephone Encounter (Signed)
New message    Needs new prescription sent to this pharmacy    *STAT* If patient is at the pharmacy, call can be transferred to refill team.   1. Which medications need to be refilled? (please list name of each medication and dose if known)  clopidogrel (PLAVIX) 75 MG tablet Take 1 tablet (75 mg total) by mouth daily with breakfast.    2. Which pharmacy/location (including street and city if local pharmacy) is medication to be sent to? [address]   3. Do they need a 30 day or 90 day supply?  [place]

## 2017-04-06 ENCOUNTER — Other Ambulatory Visit: Payer: Self-pay

## 2017-04-06 MED ORDER — CLOPIDOGREL BISULFATE 75 MG PO TABS: 75.0000 mg | ORAL_TABLET | Freq: Every day | ORAL | 6 refills | Status: DC

## 2017-04-07 ENCOUNTER — Telehealth: Payer: Self-pay | Admitting: Cardiovascular Disease

## 2017-04-07 NOTE — Telephone Encounter (Signed)
Returned call to patient.She stated she has been having chest tightness ever since she had coronary stent.No chest tightness at present.Appointment scheduled with Rosaria Ferries PA 04/12/17 at 1:30 pm.Advised to go to ED if needed.

## 2017-04-07 NOTE — Telephone Encounter (Signed)
New Message  Pt c/o of Chest Pain: STAT if CP now or developed within 24 hours  1. Are you having CP right now? no  2. Are you experiencing any other symptoms (ex. SOB, nausea, vomiting, sweating)? Nausea monday  3. How long have you been experiencing CP? On and off since last week  4. Is your CP continuous or coming and going? Comes and goes  5. Have you taken Nitroglycerin? Yes, took it on Monday  ?

## 2017-04-08 ENCOUNTER — Telehealth (HOSPITAL_COMMUNITY): Payer: Self-pay | Admitting: Pharmacy Technician

## 2017-04-08 NOTE — Telephone Encounter (Signed)
Cardiac Rehab Medication Review by a Pharmacist  Does the patient  feel that his/her medications are working for him/her?  yes  Has the patient been experiencing any side effects to the medications prescribed?  no  Does the patient measure his/her own blood pressure or blood glucose at home?  yes   Does the patient have any problems obtaining medications due to transportation or finances?   no  Understanding of regimen: good Understanding of indications: fair Potential of compliance: good    Pharmacist comments: Pt has no issues at this time with her medications, and reports no recent changes.  Her BP at home has been labile, and she reports still having some mild CP which she has discussed with cardiology and will be seen for before cardiac rehab next week.     Bertis Ruddy, PharmD Pharmacy Resident Pager #: [phone] 04/08/2017 4:03 PM

## 2017-04-12 ENCOUNTER — Ambulatory Visit: Payer: Medicare Other | Admitting: Physician Assistant

## 2017-04-12 ENCOUNTER — Encounter: Payer: Self-pay | Admitting: Physician Assistant

## 2017-04-12 VITALS — BP 130/68 | HR 56 | Ht 62.0 in | Wt 170.0 lb

## 2017-04-12 DIAGNOSIS — I2510 Atherosclerotic heart disease of native coronary artery without angina pectoris: Secondary | ICD-10-CM

## 2017-04-12 DIAGNOSIS — R0789 Other chest pain: Secondary | ICD-10-CM

## 2017-04-12 DIAGNOSIS — I10 Essential (primary) hypertension: Secondary | ICD-10-CM | POA: Diagnosis not present

## 2017-04-12 DIAGNOSIS — R001 Bradycardia, unspecified: Secondary | ICD-10-CM | POA: Diagnosis not present

## 2017-04-12 MED ORDER — AMLODIPINE BESYLATE 2.5 MG PO TABS: 2.5000 mg | ORAL_TABLET | Freq: Every day | ORAL | 6 refills | Status: DC

## 2017-04-12 NOTE — Progress Notes (Signed)
Cardiology Office Note   Date:  04/12/2017   ID:  [name] [name], DOB 06-09-1950, MRN [id number]  PCP:  Beaulah Dinning, MD  Cardiologist: Dr. Jens Som, 03/12/2017 [name], PA-C   Chief Complaint  Patient presents with  . Follow-up    has been having chest pressure since her stent was placed    History of Present Illness: [name] [name] is a 67 y.o. female with a history of HTN, HLD, MVP, orthostatic hypotension, nl EF 2016 echo, cath 03/01/2017 w/ DES RI, med rx for RCA 20%, EF 50-55%  12/14 office visit, pt w/ 1 episode of CP>brief and resolved. Microcytic anemia seen>>f/u w/ GI 01/09 phone notes regarding chest tightness, appointment made  [name] [name] presents for cardiology follow up  She is having episodes of chest pressure. They are not associated with exertion, happen at random. The one on Saturday was more severe and happened when she was getting ready to go in the church.  The pressure will start in her upper chest and move up towards her neck.  It is bilateral.  She has not gotten any headaches with this.  The symptoms resolve in about 15 minutes without intervention. She can get it every day, but more recently has gotten it only 2 x week, rarely will get it more than once a day. She has not had it during the night. The only time she got it in bed was when she was sleeping in, about 10 am. 4-5/10, harder to breathe from the pain. A suffocating feeling. No dizziness, not light-headed. No change with position. She takes slow deep breaths to help it go away. She has gotten some nausea, not all the time.  She has not gotten headaches with it.  The most strenuous thing she has done recently is boxing with her grandson. She got SOB with that but in all level appropriate to the exertion. She did not get CP with the boxing.   She denies new SOB w/ exertion. No orthopnea, LE edema or PND.   She has not taken her BP during the pain, but once as it was  going away, and SBP was 114, does not remember the HR. Her blood pressure normally runs higher.   Past Medical History:  Diagnosis Date  . Depression   . Hyperlipidemia   . Hypertension   . Microcytic anemia   . Mitral valve prolapse     Past Surgical History:  Procedure Laterality Date  . CORONARY STENT INTERVENTION N/A 03/01/2017   Procedure: CORONARY STENT INTERVENTION;  Surgeon: Kathleene Hazel, MD;  Location: [facility];  Service: Cardiovascular;  Laterality: N/A;  . INTRAVASCULAR PRESSURE WIRE/FFR STUDY N/A 03/01/2017   Procedure: INTRAVASCULAR PRESSURE WIRE/FFR STUDY;  Surgeon: Kathleene Hazel, MD;  Location: [facility];  Service: Cardiovascular;  Laterality: N/A;  . LEFT HEART CATH AND CORONARY ANGIOGRAPHY N/A 03/01/2017   Procedure: LEFT HEART CATH AND CORONARY ANGIOGRAPHY;  Surgeon: Kathleene Hazel, MD;  Location: [facility];  Service: Cardiovascular;  Laterality: N/A;  . PARTIAL HYSTERECTOMY      Current Outpatient Medications  Medication Sig Dispense Refill  . aspirin 81 MG tablet Take 81 mg by mouth daily.    [name] atorvastatin (LIPITOR) 40 MG tablet Take 1 tablet (40 mg total) by mouth daily. 30 tablet 5  . clopidogrel (PLAVIX) 75 MG tablet Take 1 tablet (75 mg total) by mouth daily with breakfast. 30 tablet 6  .  cyclobenzaprine (FLEXERIL) 10 MG tablet TAKE 10 MG BY MOUTH AT BEDTIME FOR BACK PAIN AND AS SLEEP AID  1  . ferrous sulfate 325 (65 FE) MG tablet Take 1 tablet (325 mg total) by mouth daily with breakfast. 30 tablet 3  . fluticasone (FLONASE) 50 MCG/ACT nasal spray Place 1 spray daily into both nostrils.    . metoprolol tartrate (LOPRESSOR) 25 MG tablet Take 0.5 tablets (12.5 mg total) by mouth 2 (two) times daily. 60 tablet 0  . nitroGLYCERIN (NITROSTAT) 0.4 MG SL tablet Place 1 tablet (0.4 mg total) under the tongue every 5 (five) minutes as needed for chest pain. 25 tablet 5  . pantoprazole (PROTONIX) 40 MG tablet  Take 1 tablet (40 mg total) by mouth daily. 30 tablet 2  . sertraline (ZOLOFT) 100 MG tablet Take 1 tablet (100 mg total) by mouth daily. 30 tablet 3   No current facility-administered medications for this visit.     Allergies:   Patient has no known allergies.    Social History:  The patient  reports that she quit smoking about 12 years ago. She has a 10.00 pack-year smoking history. she has never used smokeless tobacco. She reports that she drinks alcohol. She reports that she does not use drugs.   Family History:  The patient's family history includes Cancer in her father; Dementia in her sister; Diabetes in her mother; Heart disease in her mother; Hypertension in her mother and sister; Pancreatitis in her sister.    ROS:  Please see the history of present illness. All other systems are reviewed and negative.    PHYSICAL EXAM: VS:  BP 130/68   Pulse (!) 56   Ht 5\' 2"  (1.575 m)   Wt 170 lb (77.1 kg)   BMI 31.09 kg/m  , BMI Body mass index is 31.09 kg/m. GEN: Well nourished, well developed, female in no acute distress  HEENT: normal for age  Neck: no JVD, no carotid bruit, no masses Cardiac: RRR; no murmur, no rubs, or gallops Respiratory:  clear to auscultation bilaterally, normal work of breathing GI: soft, nontender, nondistended, + BS MS: no deformity or atrophy; no edema; distal pulses are 2+ in all 4 extremities   Skin: warm and dry, no rash Neuro:  Strength and sensation are intact Psych: euthymic mood, full affect   EKG:  EKG is ordered today. The ekg ordered today demonstrates sinus brady, HR 56, no acute changes. No pathologic Q waves, acute ischemic changes.   CATH: 03/01/2017  Mid RCA lesion is 20% stenosed.  Ost Ramus lesion is 80% stenosed.  A drug-eluting stent was successfully placed using a STENT PROMUS PREM MR 3.0X12.  Post intervention, there is a 0% residual stenosis.  The left ventricular systolic function is normal.  LV end diastolic pressure  is normal.  The left ventricular ejection fraction is 50-55% by visual estimate.  There is no mitral valve regurgitation.  1. Unstable angina 2. Severe stenosis ostium of the Ramus Intermediate branch 3. Successful PTCA/DES x 1 ostium of the intermediate branch Recommendations: DAPT with ASA and Plavix for one year. Continue statin. Start beta blocker. D/C home today after 6 hours bedrest per same day PCI protocol.   Recent Labs: 01/08/2017: ALT 13 02/24/2017: BUN 12; Creatinine, Ser 1.00; Hemoglobin 11.4; Platelets 407; Potassium 4.3; Sodium 144; TSH 0.673    Lipid Panel    Component Value Date/Time   CHOL 234 (H) 02/11/2017 1530   TRIG 114 02/11/2017 1530   HDL  55 02/11/2017 1530   CHOLHDL 4.3 02/11/2017 1530   CHOLHDL 3.1 05/29/2015 1527   VLDL 17 05/29/2015 1527   LDLCALC 156 (H) 02/11/2017 1530   LDLDIRECT 129 (H) 01/01/2010 2219     Wt Readings from Last 3 Encounters:  04/12/17 170 lb (77.1 kg)  03/12/17 173 lb 6.4 oz (78.7 kg)  02/24/17 174 lb (78.9 kg)     Other studies Reviewed: Additional studies/ records that were reviewed today include: Office notes, hospital records and testing.  ASSESSMENT AND PLAN:  1.  Chest pain: Symptoms are atypical.  I reviewed the patient's cardiac cath with she and her husband.  I explained that if the stent were closed, that would be a heart attack and I did not believe she was having a heart attack.  She agrees.  I explained that the only other lesion she had was a 20% lesion it and did not believe this was causing resting chest pain.  She understands that concept as well.  She has a history of GERD, but feels the symptoms are different and not related to GI issues.  I explained that I am not sure what is causing this, it is okay to take Tylenol as needed.  I encouraged her to check her blood pressure during the episodes to see if her blood pressure is running very high or very low as this could contribute to chest pain.  It also may  be heart rate related, if her heart rate is very slow or very fast.  If she gets any readings that are significantly abnormal in regards to her heart rate, she will need to wear a monitor.   She is okay to start cardiac rehab.  2.  Sinus bradycardia: She is asymptomatic in the office with a heart rate in the 50s.  Continue low-dose metoprolol for now.  3.  Hypertension: Her systolic blood pressure generally runs high at home, in the 130s-140s.  Her blood pressure was previously well controlled on amlodipine, she believes 10 mg daily.  I will restart the amlodipine at 2.5 mg daily and see how this is tolerated.  This can be increased as needed for better blood pressure control.  She is encouraged to continue to track her blood pressure.  4.  CAD: She is compliant with her medications including aspirin, Plavix, beta-blocker and statin.  She also has sublingual nitroglycerin to use as needed but has not tried it.  No med changes, no testing at this time.   Current medicines are reviewed at length with the patient today.  The patient has concerns regarding medicines. Concerns were addressed The following changes have been made: Amlodipine  Labs/ tests ordered today include:   Orders Placed This Encounter  Procedures  . EKG 12-Lead     Disposition:   FU with Dr. Jens Som as scheduled next month.  Melida Quitter, PA-C  04/12/2017 5:19 PM    [facility] Phone: [phone]; Fax: [phone]  This note was written with the assistance of speech recognition software. Please excuse any transcriptional errors.

## 2017-04-12 NOTE — Patient Instructions (Signed)
Medication  Instructions  Amlodipine  2.5 mg one tablet daily.   Okay to go to cardiac rehab -  Suanne Marker will place in office note.  No test needed    Keep a track on blood pressure  And heart rate  If heart rate is below 50 or above 120  Call office. If blood pressure (top number ) below 100 or above 160 ,alsocall office.    Your physician recommends that you keep schedule  follow-up appointment with DR CRENSHAW.   If you need a refill on your cardiac medications before your next appointment, please call your pharmacy.

## 2017-04-13 ENCOUNTER — Encounter (HOSPITAL_COMMUNITY)
Admission: RE | Admit: 2017-04-13 | Discharge: 2017-04-13 | Disposition: A | Discharge status: Home / Self Care | Payer: Medicare Other | Source: Ambulatory Visit | Attending: Cardiology | Admitting: Cardiology

## 2017-04-13 ENCOUNTER — Encounter (HOSPITAL_COMMUNITY): Payer: Self-pay

## 2017-04-13 VITALS — BP 114/72 | HR 59 | Ht 62.0 in | Wt 172.2 lb

## 2017-04-13 DIAGNOSIS — Z955 Presence of coronary angioplasty implant and graft: Secondary | ICD-10-CM

## 2017-04-13 HISTORY — DX: Atherosclerotic heart disease of native coronary artery without angina pectoris: I25.10

## 2017-04-13 NOTE — Progress Notes (Signed)
Cardiac Individual Treatment Plan  Patient Details  Name: [name] [name] MRN: [id number] Date of Birth: 05/06/1950 Referring Provider:     CARDIAC REHAB PHASE II ORIENTATION from 04/13/2017 in [facility] CARDIAC [facility]  Referring Provider  Olga Millers MD      Initial Encounter Date:    CARDIAC REHAB PHASE II ORIENTATION from 04/13/2017 in [facility] CARDIAC REHAB  Date  04/13/17  Referring Provider  Olga Millers MD      Visit Diagnosis: Stented coronary artery 03/01/17 S/P DES Ostial Ramus  Patient's Home Medications on Admission:  Current Outpatient Medications:  .  amLODipine (NORVASC) 2.5 MG tablet, Take 1 tablet (2.5 mg total) by mouth daily., Disp: 30 tablet, Rfl: 6 .  aspirin 81 MG tablet, Take 81 mg by mouth daily., Disp: , Rfl:  .  atorvastatin (LIPITOR) 40 MG tablet, Take 1 tablet (40 mg total) by mouth daily., Disp: 30 tablet, Rfl: 5 .  clopidogrel (PLAVIX) 75 MG tablet, Take 1 tablet (75 mg total) by mouth daily with breakfast., Disp: 30 tablet, Rfl: 6 .  cyclobenzaprine (FLEXERIL) 10 MG tablet, TAKE 10 MG BY MOUTH AT BEDTIME FOR BACK PAIN AND AS SLEEP AID, Disp: , Rfl: 1 .  ferrous sulfate 325 (65 FE) MG tablet, Take 1 tablet (325 mg total) by mouth daily with breakfast., Disp: 30 tablet, Rfl: 3 .  fluticasone (FLONASE) 50 MCG/ACT nasal spray, Place 1 spray daily into both nostrils., Disp: , Rfl:  .  metoprolol tartrate (LOPRESSOR) 25 MG tablet, Take 0.5 tablets (12.5 mg total) by mouth 2 (two) times daily., Disp: 60 tablet, Rfl: 0 .  nitroGLYCERIN (NITROSTAT) 0.4 MG SL tablet, Place 1 tablet (0.4 mg total) under the tongue every 5 (five) minutes as needed for chest pain., Disp: 25 tablet, Rfl: 5 .  pantoprazole (PROTONIX) 40 MG tablet, Take 1 tablet (40 mg total) by mouth daily., Disp: 30 tablet, Rfl: 2 .  sertraline (ZOLOFT) 100 MG tablet, Take 1 tablet (100 mg total) by mouth daily., Disp: 30 tablet, Rfl: 3  Past Medical  History: Past Medical History:  Diagnosis Date  . Coronary artery disease   . Depression   . Hyperlipidemia   . Hypertension   . Microcytic anemia   . Mitral valve prolapse     Tobacco Use: Social History   Tobacco Use  Smoking Status Former Smoker  . Packs/day: 0.50  . Years: 20.00  . Pack years: 10.00  . Last attempt to quit: 07/30/2004  . Years since quitting: 12.7  Smokeless Tobacco Never Used    Labs: Recent Hydrographic surveyor    Labs for ITP Cardiac and Pulmonary Rehab Latest Ref Rng & Units 05/29/2015 07/11/2015 11/22/2015 09/04/2016 02/11/2017   Cholestrol 100 - 199 mg/dL 782 - - - 956(O)   LDLCALC 0 - 99 mg/dL 130 - - - 865(H)   LDLDIRECT mg/dL - - - - -   HDL >84 mg/dL 64 - - - 55   Trlycerides 0 - 149 mg/dL 84 - - - 696   Hemoglobin A1c - - 5.7 5.9 5.8 -      Capillary Blood Glucose: No results found for: GLUCAP   Exercise Target Goals: Date: 04/13/17  Exercise Program Goal: Individual exercise prescription set with THRR, safety & activity barriers. Participant demonstrates ability to understand and report RPE using BORG scale, to self-measure pulse accurately, and to acknowledge the importance of the exercise prescription.  Exercise Prescription Goal: Starting with aerobic  activity 30 plus minutes a day, 3 days per week for initial exercise prescription. Provide home exercise prescription and guidelines that participant acknowledges understanding prior to discharge.  Activity Barriers & Risk Stratification: Activity Barriers & Cardiac Risk Stratification - 04/13/17 1451      Activity Barriers & Cardiac Risk Stratification   Activity Barriers  Muscular Weakness;Deconditioning    Cardiac Risk Stratification  High       6 Minute Walk: 6 Minute Walk    Row Name 04/13/17 1444 04/13/17 1457       6 Minute Walk   Phase  Initial  -    Distance  1659 feet  -    Walk Time  6 minutes  -    # of Rest Breaks  0  -    MPH  3.14  -    METS  3.5  -    RPE   11  -    VO2 Peak  12.1  -    Symptoms  No  -    Resting HR  58 bpm  -    Resting BP  114/72  -    Resting Oxygen Saturation   98 %  -    Exercise Oxygen Saturation  during 6 min walk  97 %  -    Max Ex. HR  98 bpm  -    Max Ex. BP  142/60  -    2 Minute Post BP  -  124/64       Oxygen Initial Assessment:   Oxygen Re-Evaluation:   Oxygen Discharge (Final Oxygen Re-Evaluation):   Initial Exercise Prescription: Initial Exercise Prescription - 04/13/17 1400      Date of Initial Exercise RX and Referring Provider   Date  04/13/17    Referring Provider  Olga Millers MD      Treadmill   MPH  2.5    Grade  1    Minutes  15    METs  3.26      Bike   Level  0.5    Minutes  15    METs  2.18      NuStep   Level  3    SPM  80    Minutes  10    METs  2      Prescription Details   Frequency (times per week)  3    Duration  Progress to 30 minutes of continuous aerobic without signs/symptoms of physical distress      Intensity   THRR 40-80% of Max Heartrate  67-122    Ratings of Perceived Exertion  11-13    Perceived Dyspnea  0-4      Progression   Progression  Continue to progress workloads to maintain intensity without signs/symptoms of physical distress.      Resistance Training   Training Prescription  Yes    Weight  2lbs    Reps  10-15       Perform Capillary Blood Glucose checks as needed.  Exercise Prescription Changes:   Exercise Comments:   Exercise Goals and Review: Exercise Goals    Row Name 04/13/17 1448             Exercise Goals   Increase Physical Activity  Yes       Intervention  Provide advice, education, support and counseling about physical activity/exercise needs.;Develop an individualized exercise prescription for aerobic and resistive training based on initial evaluation findings, risk stratification, comorbidities and participant's personal  goals.       Expected Outcomes  Achievement of increased cardiorespiratory fitness  and enhanced flexibility, muscular endurance and strength shown through measurements of functional capacity and personal statement of participant.       Increase Strength and Stamina  Yes       Intervention  Provide advice, education, support and counseling about physical activity/exercise needs.;Develop an individualized exercise prescription for aerobic and resistive training based on initial evaluation findings, risk stratification, comorbidities and participant's personal goals.       Expected Outcomes  Achievement of increased cardiorespiratory fitness and enhanced flexibility, muscular endurance and strength shown through measurements of functional capacity and personal statement of participant.       Able to understand and use rate of perceived exertion (RPE) scale  Yes       Intervention  Provide education and explanation on how to use RPE scale       Expected Outcomes  Short Term: Able to use RPE daily in rehab to express subjective intensity level;Long Term:  Able to use RPE to guide intensity level when exercising independently       Knowledge and understanding of Target Heart Rate Range (THRR)  Yes       Intervention  Provide education and explanation of THRR including how the numbers were predicted and where they are located for reference       Expected Outcomes  Short Term: Able to use daily as guideline for intensity in rehab;Short Term: Able to state/look up THRR;Long Term: Able to use THRR to govern intensity when exercising independently       Able to check pulse independently  Yes       Intervention  Provide education and demonstration on how to check pulse in carotid and radial arteries.;Review the importance of being able to check your own pulse for safety during independent exercise       Expected Outcomes  Short Term: Able to explain why pulse checking is important during independent exercise;Long Term: Able to check pulse independently and accurately       Understanding of  Exercise Prescription  Yes       Intervention  Provide education, explanation, and written materials on patient's individual exercise prescription       Expected Outcomes  Short Term: Able to explain program exercise prescription;Long Term: Able to explain home exercise prescription to exercise independently          Exercise Goals Re-Evaluation :    Discharge Exercise Prescription (Final Exercise Prescription Changes):   Nutrition:  Target Goals: Understanding of nutrition guidelines, daily intake of sodium 1500mg , cholesterol 200mg , calories 30% from fat and 7% or less from saturated fats, daily to have 5 or more servings of fruits and vegetables.  Biometrics: Pre Biometrics - 04/13/17 1449      Pre Biometrics   Height  5\' 2"  (1.575 m)    Weight  172 lb 2.9 oz (78.1 kg)    Waist Circumference  36.25 inches    Hip Circumference  44 inches    Waist to Hip Ratio  0.82 %    BMI (Calculated)  31.48    Triceps Skinfold  30 mm    % Body Fat  42.1 %    Grip Strength  35 kg    Flexibility  11 in    Single Leg Stand  22 seconds        Nutrition Therapy Plan and Nutrition Goals:   Nutrition Discharge: Nutrition Scores:  Nutrition Goals Re-Evaluation:   Nutrition Goals Re-Evaluation:   Nutrition Goals Discharge (Final Nutrition Goals Re-Evaluation):   Psychosocial: Target Goals: Acknowledge presence or absence of significant depression and/or stress, maximize coping skills, provide positive support system. Participant is able to verbalize types and ability to use techniques and skills needed for reducing stress and depression.  Initial Review & Psychosocial Screening: Initial Psych Review & Screening - 04/13/17 1410      Initial Review   Current issues with  None Identified      Family Dynamics   Good Support System?  Yes Husband, family, friends       Barriers   Psychosocial barriers to participate in program  There are no identifiable barriers or psychosocial  needs.      Screening Interventions   Interventions  Encouraged to exercise       Quality of Life Scores: Quality of Life - 04/13/17 1414      Quality of Life Scores   Health/Function Pre  21.13 %    Socioeconomic Pre  24.94 %    Psych/Spiritual Pre  26.57 %    Family Pre  28.8 %    GLOBAL Pre  24.19 %       PHQ-9: Recent Review Flowsheet Data    Depression screen [facility] 2/9 01/08/2017 09/04/2016 03/18/2016 02/19/2016 07/11/2015   Decreased Interest 2 0 0 0 0   Down, Depressed, Hopeless 1 0 0 0 0   PHQ - 2 Score 3 0 0 0 0   Altered sleeping 3 - - - -   Tired, decreased energy 2 - - - -   Change in appetite 3 - - - -   Feeling bad or failure about yourself  0 - - - -   Trouble concentrating 2 - - - -   Moving slowly or fidgety/restless 2 - - - -   Suicidal thoughts 0 - - - -   PHQ-9 Score 15 - - - -     Interpretation of Total Score  Total Score Depression Severity:  1-4 = Minimal depression, 5-9 = Mild depression, 10-14 = Moderate depression, 15-19 = Moderately severe depression, 20-27 = Severe depression   Psychosocial Evaluation and Intervention:   Psychosocial Re-Evaluation:   Psychosocial Discharge (Final Psychosocial Re-Evaluation):   Vocational Rehabilitation: Provide vocational rehab assistance to qualifying candidates.   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 Participant will state understanding/return demonstration of topics presented.  Learning Barriers/Preferences: Learning Barriers/Preferences - 04/13/17 1409      Learning Barriers/Preferences   Learning Barriers  Sight    Learning Preferences  Written Material;Verbal Instruction       Education Topics: Count Your Pulse:   -Group instruction provided by verbal instruction, demonstration, patient participation and written materials to support subject.  Instructors address importance of being able to find your pulse and how to count your pulse when at home without a heart monitor.  Patients get hands on experience counting their pulse with staff help and individually.   Heart Attack, Angina, and Risk Factor Modification:  -Group instruction provided by verbal instruction, video, and written materials to support subject.  Instructors address signs and symptoms of angina and heart attacks.    Also  discuss risk factors for heart disease and how to make changes to improve heart health risk factors.   Functional Fitness:  -Group instruction provided by verbal instruction, demonstration, patient participation, and written materials to support subject.  Instructors address safety measures for doing things around the house.  Discuss how to get up and down off the floor, how to pick things up properly, how to safely get out of a chair without assistance, and balance training.   Meditation and Mindfulness:  -Group instruction provided by verbal instruction, patient participation, and written materials to support subject.  Instructor addresses importance of mindfulness and meditation practice to help reduce stress and improve awareness.  Instructor also leads participants through a meditation exercise.    Stretching for Flexibility and Mobility:  -Group instruction provided by verbal instruction, patient participation, and written materials to support subject.  Instructors lead participants through series of stretches that are designed to increase flexibility thus improving mobility.  These stretches are additional exercise for major muscle groups that are typically performed during regular warm up and cool down.   Hands Only CPR:  -Group verbal, video, and participation provides a basic overview of AHA guidelines for  community CPR. Role-play of emergencies allow participants the opportunity to practice calling for help and chest compression technique with discussion of AED use.   Hypertension: -Group verbal and written instruction that provides a basic overview of hypertension including the most recent diagnostic guidelines, risk factor reduction with self-care instructions and medication management.    Nutrition I class: Heart Healthy Eating:  -Group instruction provided by PowerPoint slides, verbal discussion, and written materials to support subject matter. The instructor gives an explanation and review of the Therapeutic Lifestyle Changes diet recommendations, which includes a discussion on lipid goals, dietary fat, sodium, fiber, plant stanol/sterol esters, sugar, and the components of a well-balanced, healthy diet.   Nutrition II class: Lifestyle Skills:  -Group instruction provided by PowerPoint slides, verbal discussion, and written materials to support subject matter. The instructor gives an explanation and review of label reading, grocery shopping for heart health, heart healthy recipe modifications, and ways to make healthier choices when eating out.   Diabetes Question & Answer:  -Group instruction provided by PowerPoint slides, verbal discussion, and written materials to support subject matter. The instructor gives an explanation and review of diabetes co-morbidities, pre- and post-prandial blood glucose goals, pre-exercise blood glucose goals, signs, symptoms, and treatment of hypoglycemia and hyperglycemia, and foot care basics.   Diabetes Blitz:  -Group instruction provided by PowerPoint slides, verbal discussion, and written materials to support subject matter. The instructor gives an explanation and review of the physiology behind type 1 and type 2 diabetes, diabetes medications and rational behind using different medications, pre- and post-prandial blood glucose recommendations and  Hemoglobin A1c goals, diabetes diet, and exercise including blood glucose guidelines for exercising safely.    Portion Distortion:  -Group instruction provided by PowerPoint slides, verbal discussion, written materials, and food models to support subject matter. The instructor gives an explanation of serving size versus portion size, changes in portions sizes over the last 20 years, and what consists of a serving from each food group.   Stress Management:  -Group instruction provided by verbal instruction, video, and written materials to support subject matter.  Instructors review role of stress in heart disease and how to cope with stress positively.     Exercising on Your Own:  -Group instruction provided by verbal instruction, power point, and written materials to  support subject.  Instructors discuss benefits of exercise, components of exercise, frequency and intensity of exercise, and end points for exercise.  Also discuss use of nitroglycerin and activating EMS.  Review options of places to exercise outside of rehab.  Review guidelines for sex with heart disease.   Cardiac Drugs I:  -Group instruction provided by verbal instruction and written materials to support subject.  Instructor reviews cardiac drug classes: antiplatelets, anticoagulants, beta blockers, and statins.  Instructor discusses reasons, side effects, and lifestyle considerations for each drug class.   Cardiac Drugs II:  -Group instruction provided by verbal instruction and written materials to support subject.  Instructor reviews cardiac drug classes: angiotensin converting enzyme inhibitors (ACE-I), angiotensin II receptor blockers (ARBs), nitrates, and calcium channel blockers.  Instructor discusses reasons, side effects, and lifestyle considerations for each drug class.   Anatomy and Physiology of the Circulatory System:  Group verbal and written instruction and models provide basic cardiac anatomy and physiology,  with the coronary electrical and arterial systems. Review of: AMI, Angina, Valve disease, Heart Failure, Peripheral Artery Disease, Cardiac Arrhythmia, Pacemakers, and the ICD.   Other Education:  -Group or individual verbal, written, or video instructions that support the educational goals of the cardiac rehab program.   Knowledge Questionnaire Score: Knowledge Questionnaire Score - 04/13/17 1408      Knowledge Questionnaire Score   Pre Score  20/24       Core Components/Risk Factors/Patient Goals at Admission: Personal Goals and Risk Factors at Admission - 04/13/17 1449      Core Components/Risk Factors/Patient Goals on Admission    Weight Management  Yes;Obesity;Weight Maintenance;Weight Loss    Intervention  Weight Management: Develop a combined nutrition and exercise program designed to reach desired caloric intake, while maintaining appropriate intake of nutrient and fiber, sodium and fats, and appropriate energy expenditure required for the weight goal.;Weight Management: Provide education and appropriate resources to help participant work on and attain dietary goals.;Weight Management/Obesity: Establish reasonable short term and long term weight goals.;Obesity: Provide education and appropriate resources to help participant work on and attain dietary goals.    Admit Weight  172 lb 2.9 oz (78.1 kg)    Goal Weight: Short Term  165 lb (74.8 kg)    Goal Weight: Long Term  150 lb (68 kg)    Expected Outcomes  Short Term: Continue to assess and modify interventions until short term weight is achieved;Long Term: Adherence to nutrition and physical activity/exercise program aimed toward attainment of established weight goal;Weight Maintenance: Understanding of the daily nutrition guidelines, which includes 25-35% calories from fat, 7% or less cal from saturated fats, less than 200mg  cholesterol, less than 1.5gm of sodium, & 5 or more servings of fruits and vegetables daily;Weight Loss:  Understanding of general recommendations for a balanced deficit meal plan, which promotes 1-2 lb weight loss per week and includes a negative energy balance of [phone] kcal/d;Understanding recommendations for meals to include 15-35% energy as protein, 25-35% energy from fat, 35-60% energy from carbohydrates, less than 200mg  of dietary cholesterol, 20-35 gm of total fiber daily;Understanding of distribution of calorie intake throughout the day with the consumption of 4-5 meals/snacks    Improve shortness of breath with ADL's  Yes    Intervention  Provide education, individualized exercise plan and daily activity instruction to help decrease symptoms of SOB with activities of daily living.    Expected Outcomes  Short Term: Achieves a reduction of symptoms when performing activities of daily living.  Hypertension  Yes    Intervention  Provide education on lifestyle modifcations including regular physical activity/exercise, weight management, moderate sodium restriction and increased consumption of fresh fruit, vegetables, and low fat dairy, alcohol moderation, and smoking cessation.;Monitor prescription use compliance.    Expected Outcomes  Short Term: Continued assessment and intervention until BP is < 140/67mm HG in hypertensive participants. < 130/26mm HG in hypertensive participants with diabetes, heart failure or chronic kidney disease.;Long Term: Maintenance of blood pressure at goal levels.    Lipids  Yes    Intervention  Provide education and support for participant on nutrition & aerobic/resistive exercise along with prescribed medications to achieve LDL 70mg , HDL >40mg .    Expected Outcomes  Short Term: Participant states understanding of desired cholesterol values and is compliant with medications prescribed. Participant is following exercise prescription and nutrition guidelines.;Long Term: Cholesterol controlled with medications as prescribed, with individualized exercise RX and with  personalized nutrition plan. Value goals: LDL < 70mg , HDL > 40 mg.       Core Components/Risk Factors/Patient Goals Review:    Core Components/Risk Factors/Patient Goals at Discharge (Final Review):    ITP Comments: ITP Comments    Row Name 04/13/17 1408           ITP Comments  Dr. Armanda Magic, Medical Director           Comments: Orvetta attended orientation from 1335 to 1451 to review rules and guidelines for program. Completed 6 minute walk test, Intitial ITP, and exercise prescription.  VSS. Telemetry-Sinus Rhtyhm.  Asymptomatic.Gladstone Lighter, RN,BSN 04/13/2017 4:02 PM

## 2017-04-14 NOTE — Progress Notes (Signed)
[name] [name] 67 y.o. female DOB: [birth year] MRN: [id number]      Nutrition Note  1. Stented coronary artery 03/01/17 S/P DES Ostial Ramus    Past Medical History:  Diagnosis Date  . Coronary artery disease   . Depression   . Hyperlipidemia   . Hypertension   . Microcytic anemia   . Mitral valve prolapse    Meds reviewed.   HT: Ht Readings from Last 1 Encounters:  04/13/17 5\' 2"  (1.575 m)    WT: Wt Readings from Last 3 Encounters:  04/13/17 172 lb 2.9 oz (78.1 kg)  04/12/17 170 lb (77.1 kg)  03/12/17 173 lb 6.4 oz (78.7 kg)     BMI 31.5   Current tobacco use? No   Labs:  Lipid Panel     Component Value Date/Time   CHOL 234 (H) 02/11/2017 1530   TRIG 114 02/11/2017 1530   HDL 55 02/11/2017 1530   CHOLHDL 4.3 02/11/2017 1530   CHOLHDL 3.1 05/29/2015 1527   VLDL 17 05/29/2015 1527   LDLCALC 156 (H) 02/11/2017 1530   LDLDIRECT 129 (H) 01/01/2010 2219    Lab Results  Component Value Date   HGBA1C 5.8 09/04/2016   CBG (last 3)  No results for input(s): GLUCAP in the last 72 hours.  Nutrition Note Spoke with pt. Nutrition plan and goals reviewed with pt. Pt is following Step 1 of the Therapeutic Lifestyle Changes diet. Pt wants to lose wt. Wt loss tips reviewed. Pt expressed understanding of the information reviewed. Pt aware of nutrition education classes offered and plans on attending nutrition classes.  Nutrition Diagnosis ? Food-and nutrition-related knowledge deficit related to lack of exposure to information as related to diagnosis of: ? CVD ? Pre-DM ? Obesity related to excessive energy intake as evidenced by a BMI of 31.5  Nutrition Intervention ? Pt's individual nutrition plan and goals reviewed with pt.  Nutrition Goal(s):  ? Pt to identify and limit food sources of saturated fat, trans fat, and sodium ? Pt to identify food quantities necessary to achieve weight loss of 6-15 lb (2.7-10.9 kg) at graduation from cardiac rehab.  Plan:  Pt to  attend nutrition classes ? Nutrition I ? Nutrition II ? Portion Distortion  Will provide client-centered nutrition education as part of interdisciplinary care.   Monitor and evaluate progress toward nutrition goal with team.  Derek Mound, M.Ed, RD, LDN, CDE 04/14/2017 10:09 AM

## 2017-04-19 ENCOUNTER — Encounter (HOSPITAL_COMMUNITY): Payer: Self-pay

## 2017-04-19 ENCOUNTER — Encounter (HOSPITAL_COMMUNITY)
Admission: RE | Admit: 2017-04-19 | Discharge: 2017-04-19 | Disposition: A | Discharge status: Home / Self Care | Payer: Medicare Other | Source: Ambulatory Visit | Attending: Cardiology | Admitting: Cardiology

## 2017-04-19 DIAGNOSIS — Z955 Presence of coronary angioplasty implant and graft: Secondary | ICD-10-CM | POA: Diagnosis present

## 2017-04-19 NOTE — Progress Notes (Signed)
Daily Session Note  Patient Details  Name: [name] [name] MRN: [id number] Date of Birth: [birth year] Referring Provider:     CARDIAC REHAB PHASE II ORIENTATION from 04/13/2017 in [place]  Referring Provider  [name] MD      Encounter Date: 04/19/2017  Check In: Session Check In - 04/19/17 1351      Check-In   Location  [facility]    Staff Present  Barnet Pall, RN, BSN;Rakwon Letourneau, RN, Mosie Epstein, MS,ACSM CEP, Exercise Physiologist;Molly diVincenzo, MS, ACSM RCEP, Exercise Physiologist    Supervising physician immediately available to respond to emergencies  [facility] immediately available    Physician(s)  Dr Cathlean Sauer    Medication changes reported      No    Fall or balance concerns reported     No    Tobacco Cessation  No Change    Warm-up and Cool-down  Performed as group-led instruction    Resistance Training Performed  Yes    VAD Patient?  No      Pain Assessment   Currently in Pain?  No/denies       Capillary Blood Glucose: No results found for this or any previous visit (from the past 24 hour(s)).    Social History   Tobacco Use  Smoking Status Former Smoker  . Packs/day: 0.50  . Years: 20.00  . Pack years: 10.00  . Last attempt to quit: 07/30/2004  . Years since quitting: 12.7  Smokeless Tobacco Never Used    Goals Met:  Exercise tolerated well  Goals Unmet:  Not Applicable  Comments: Pt started cardiac rehab today.  Pt tolerated light exercise without difficulty. VSS, telemetry-sinus rhythm,  asymptomatic.  Medication list reconciled. Pt denies barriers to medicaiton compliance.  PSYCHOSOCIAL ASSESSMENT:  PHQ-1.  Pt with known history of depression and health related anxiety.  Pt exhibits positive coping skills, hopeful outlook with supportive family. No psychosocial needs identified at this time, no psychosocial interventions necessary.    Pt enjoys motorcycle riding. Personal  goals are to stablize her pain and medication adjustments, regain self confidence and reassurance about her health, and increase heart health knowledge.    Pt oriented to exercise equipment and routine.    Understanding verbalized.   Dr. Fransico Him is Medical Director for Cardiac Rehab at [facility].

## 2017-04-20 ENCOUNTER — Telehealth (HOSPITAL_COMMUNITY): Payer: Self-pay | Admitting: Cardiac Rehabilitation

## 2017-04-20 NOTE — Telephone Encounter (Signed)
-----   Message from Lelon Perla, MD sent at 04/20/2017  7:34 AM EST ----- Regarding: RE: cardaic rehab  She has fu with me next month; of for rehab [name]  ----- Message ----- From: Lowell Guitar, RN Sent: 04/19/2017   4:43 PM To: Lelon Perla, MD Subject: cardaic rehab                                  Dear Dr. Stanford Breed,  Pt started cardiac rehab today.  She tolerated light activity without difficulty and no chest pain.  However pt reports that she is continuing to have pain at home for which she uses NTG SL.  She used one this morning for rest pain that was present when she woke up this am.     She saw Rosaria Ferries last week and restarted amlodipine 2.5mg .  Suanne Marker discussed with pt the  atypical nature of her  symptoms.    VS today BP:  128/80 rest, 158/64 exercise, HR: 72 rest, 96 peak exercise.  Telemetry- sinus rhythm, no acute ST-T changes.  She is eager to participate in cardiac rehab to improve her cardiac health decrease health related anxiety.  We will continue to monitor.   Please advise your thoughts on her continued NTG use.    Thank you, Andi Hence, RN, BSN Cardiac Pulmonary Rehab

## 2017-04-21 ENCOUNTER — Encounter (HOSPITAL_COMMUNITY)
Admission: RE | Admit: 2017-04-21 | Discharge: 2017-04-21 | Disposition: A | Discharge status: Home / Self Care | Payer: Medicare Other | Source: Ambulatory Visit | Attending: Cardiology | Admitting: Cardiology

## 2017-04-21 DIAGNOSIS — Z955 Presence of coronary angioplasty implant and graft: Secondary | ICD-10-CM | POA: Diagnosis not present

## 2017-04-21 NOTE — Progress Notes (Signed)
Cardiac Individual Treatment Plan  Patient Details  Name: [name] [name] MRN: [id number] Date of Birth: 1950-12-11 Referring Provider:     CARDIAC REHAB PHASE II ORIENTATION from 04/13/2017 in [facility] [place] CARDIAC [facility]  Referring Provider  Olga Millers MD      Initial Encounter Date:    CARDIAC REHAB PHASE II ORIENTATION from 04/13/2017 in [facility] CARDIAC REHAB  Date  04/13/17  Referring Provider  Olga Millers MD      Visit Diagnosis: Stented coronary artery 03/01/17 S/P DES Ostial Ramus  Patient's Home Medications on Admission:  Current Outpatient Medications:  .  amLODipine (NORVASC) 2.5 MG tablet, Take 1 tablet (2.5 mg total) by mouth daily., Disp: 30 tablet, Rfl: 6 .  aspirin 81 MG tablet, Take 81 mg by mouth daily., Disp: , Rfl:  .  atorvastatin (LIPITOR) 40 MG tablet, Take 1 tablet (40 mg total) by mouth daily., Disp: 30 tablet, Rfl: 5 .  clopidogrel (PLAVIX) 75 MG tablet, Take 1 tablet (75 mg total) by mouth daily with breakfast., Disp: 30 tablet, Rfl: 6 .  cyclobenzaprine (FLEXERIL) 10 MG tablet, TAKE 10 MG BY MOUTH AT BEDTIME FOR BACK PAIN AND AS SLEEP AID, Disp: , Rfl: 1 .  ferrous sulfate 325 (65 FE) MG tablet, Take 1 tablet (325 mg total) by mouth daily with breakfast., Disp: 30 tablet, Rfl: 3 .  fluticasone (FLONASE) 50 MCG/ACT nasal spray, Place 1 spray daily into both nostrils., Disp: , Rfl:  .  metoprolol tartrate (LOPRESSOR) 25 MG tablet, Take 0.5 tablets (12.5 mg total) by mouth 2 (two) times daily., Disp: 60 tablet, Rfl: 0 .  nitroGLYCERIN (NITROSTAT) 0.4 MG SL tablet, Place 1 tablet (0.4 mg total) under the tongue every 5 (five) minutes as needed for chest pain., Disp: 25 tablet, Rfl: 5 .  pantoprazole (PROTONIX) 40 MG tablet, Take 1 tablet (40 mg total) by mouth daily., Disp: 30 tablet, Rfl: 2 .  sertraline (ZOLOFT) 100 MG tablet, Take 1 tablet (100 mg total) by mouth daily., Disp: 30 tablet, Rfl: 3  Past Medical  History: Past Medical History:  Diagnosis Date  . Coronary artery disease   . Depression   . Hyperlipidemia   . Hypertension   . Microcytic anemia   . Mitral valve prolapse     Tobacco Use: Social History   Tobacco Use  Smoking Status Former Smoker  . Packs/day: 0.50  . Years: 20.00  . Pack years: 10.00  . Last attempt to quit: 07/30/2004  . Years since quitting: 12.7  Smokeless Tobacco Never Used    Labs: Recent Review Advice worker    Labs for ITP Cardiac and Pulmonary Rehab Latest Ref Rng & Units 05/29/2015 07/11/2015 11/22/2015 09/04/2016 02/11/2017   Cholestrol 100 - 199 mg/dL 562 - - - 130(Q)   LDLCALC 0 - 99 mg/dL 657 - - - 846(N)   LDLDIRECT mg/dL - - - - -   HDL >62 mg/dL 64 - - - 55   Trlycerides 0 - 149 mg/dL 84 - - - 952   Hemoglobin A1c - - 5.7 5.9 5.8 -      Capillary Blood Glucose: No results found for: GLUCAP   Exercise Target Goals:    Exercise Program Goal: Individual exercise prescription set using results from initial 6 min walk test and THRR while considering  patient's activity barriers and safety.   Exercise Prescription Goal: Initial exercise prescription builds to 30-45 minutes a day of aerobic activity, 2-3 days  per week.  Home exercise guidelines will be given to patient during program as part of exercise prescription that the participant will acknowledge.  Activity Barriers & Risk Stratification: Activity Barriers & Cardiac Risk Stratification - 04/13/17 1451      Activity Barriers & Cardiac Risk Stratification   Activity Barriers  Muscular Weakness;Deconditioning    Cardiac Risk Stratification  High       6 Minute Walk: 6 Minute Walk    Row Name 04/13/17 1444 04/13/17 1457       6 Minute Walk   Phase  Initial  -    Distance  1659 feet  -    Walk Time  6 minutes  -    # of Rest Breaks  0  -    MPH  3.14  -    METS  3.5  -    RPE  11  -    VO2 Peak  12.1  -    Symptoms  No  -    Resting HR  58 bpm  -    Resting BP  114/72   -    Resting Oxygen Saturation   98 %  -    Exercise Oxygen Saturation  during 6 min walk  97 %  -    Max Ex. HR  98 bpm  -    Max Ex. BP  142/60  -    2 Minute Post BP  -  124/64       Oxygen Initial Assessment:   Oxygen Re-Evaluation:   Oxygen Discharge (Final Oxygen Re-Evaluation):   Initial Exercise Prescription: Initial Exercise Prescription - 04/13/17 1400      Date of Initial Exercise RX and Referring Provider   Date  04/13/17    Referring Provider  Olga Millers MD      Treadmill   MPH  2.5    Grade  1    Minutes  15    METs  3.26      Bike   Level  0.5    Minutes  15    METs  2.18      NuStep   Level  3    SPM  80    Minutes  10    METs  2      Prescription Details   Frequency (times per week)  3    Duration  Progress to 30 minutes of continuous aerobic without signs/symptoms of physical distress      Intensity   THRR 40-80% of Max Heartrate  67-122    Ratings of Perceived Exertion  11-13    Perceived Dyspnea  0-4      Progression   Progression  Continue to progress workloads to maintain intensity without signs/symptoms of physical distress.      Resistance Training   Training Prescription  Yes    Weight  2lbs    Reps  10-15       Perform Capillary Blood Glucose checks as needed.  Exercise Prescription Changes: Exercise Prescription Changes    Row Name 04/19/17 1157             Response to Exercise   Blood Pressure (Admit)  128/80       Blood Pressure (Exercise)  158/64       Blood Pressure (Exit)  100/54       Heart Rate (Admit)  72 bpm       Heart Rate (Exercise)  96 bpm  Heart Rate (Exit)  66 bpm       Rating of Perceived Exertion (Exercise)  11       Symptoms  none       Comments  pt was oriented to exercise equipment       Duration  Continue with 30 min of aerobic exercise without signs/symptoms of physical distress.       Intensity  THRR unchanged         Progression   Progression  Continue to progress  workloads to maintain intensity without signs/symptoms of physical distress.       Average METs  3         Resistance Training   Training Prescription  Yes       Weight  2lbs       Reps  10-15       Time  10 Minutes         Treadmill   MPH  2.5       Grade  1       Minutes  15       METs  3.26         NuStep   Level  3       SPM  80       Minutes  10       METs  2.8          Exercise Comments: Exercise Comments    Row Name 04/19/17 1159           Exercise Comments  Pt was oriented to exercise equipment on 04/19/17. Pt responded well to first exercise session. Pt will progress activity levels as tolerated.          Exercise Goals and Review: Exercise Goals    Row Name 04/13/17 1448             Exercise Goals   Increase Physical Activity  Yes       Intervention  Provide advice, education, support and counseling about physical activity/exercise needs.;Develop an individualized exercise prescription for aerobic and resistive training based on initial evaluation findings, risk stratification, comorbidities and participant's personal goals.       Expected Outcomes  Achievement of increased cardiorespiratory fitness and enhanced flexibility, muscular endurance and strength shown through measurements of functional capacity and personal statement of participant.       Increase Strength and Stamina  Yes       Intervention  Provide advice, education, support and counseling about physical activity/exercise needs.;Develop an individualized exercise prescription for aerobic and resistive training based on initial evaluation findings, risk stratification, comorbidities and participant's personal goals.       Expected Outcomes  Achievement of increased cardiorespiratory fitness and enhanced flexibility, muscular endurance and strength shown through measurements of functional capacity and personal statement of participant.       Able to understand and use rate of perceived exertion  (RPE) scale  Yes       Intervention  Provide education and explanation on how to use RPE scale       Expected Outcomes  Short Term: Able to use RPE daily in rehab to express subjective intensity level;Long Term:  Able to use RPE to guide intensity level when exercising independently       Knowledge and understanding of Target Heart Rate Range (THRR)  Yes       Intervention  Provide education and explanation of THRR including how the numbers were predicted and where they  are located for reference       Expected Outcomes  Short Term: Able to use daily as guideline for intensity in rehab;Short Term: Able to state/look up THRR;Long Term: Able to use THRR to govern intensity when exercising independently       Able to check pulse independently  Yes       Intervention  Provide education and demonstration on how to check pulse in carotid and radial arteries.;Review the importance of being able to check your own pulse for safety during independent exercise       Expected Outcomes  Short Term: Able to explain why pulse checking is important during independent exercise;Long Term: Able to check pulse independently and accurately       Understanding of Exercise Prescription  Yes       Intervention  Provide education, explanation, and written materials on patient's individual exercise prescription       Expected Outcomes  Short Term: Able to explain program exercise prescription;Long Term: Able to explain home exercise prescription to exercise independently          Exercise Goals Re-Evaluation : Exercise Goals Re-Evaluation    Row Name 04/21/17 1200             Exercise Goal Re-Evaluation   Exercise Goals Review  Understanding of Exercise Prescription;Able to understand and use rate of perceived exertion (RPE) scale;Increase Strength and Stamina       Comments  Pt transitioned to exercise equipment very well and has great understanding of exercise flow and order. Pt also demonstrates proper use of RPE  scale and can exercise continously for 30 minutes       Expected Outcomes  Pt will continue to build on cardiorespiratory fitness and muscular endurance/strength           Discharge Exercise Prescription (Final Exercise Prescription Changes): Exercise Prescription Changes - 04/19/17 1157      Response to Exercise   Blood Pressure (Admit)  128/80    Blood Pressure (Exercise)  158/64    Blood Pressure (Exit)  100/54    Heart Rate (Admit)  72 bpm    Heart Rate (Exercise)  96 bpm    Heart Rate (Exit)  66 bpm    Rating of Perceived Exertion (Exercise)  11    Symptoms  none    Comments  pt was oriented to exercise equipment    Duration  Continue with 30 min of aerobic exercise without signs/symptoms of physical distress.    Intensity  THRR unchanged      Progression   Progression  Continue to progress workloads to maintain intensity without signs/symptoms of physical distress.    Average METs  3      Resistance Training   Training Prescription  Yes    Weight  2lbs    Reps  10-15    Time  10 Minutes      Treadmill   MPH  2.5    Grade  1    Minutes  15    METs  3.26      NuStep   Level  3    SPM  80    Minutes  10    METs  2.8       Nutrition:  Target Goals: Understanding of nutrition guidelines, daily intake of sodium 1500mg , cholesterol 200mg , calories 30% from fat and 7% or less from saturated fats, daily to have 5 or more servings of fruits and vegetables.  Biometrics: Pre Biometrics -  04/13/17 1449      Pre Biometrics   Height  5\' 2"  (1.575 m)    Weight  172 lb 2.9 oz (78.1 kg)    Waist Circumference  36.25 inches    Hip Circumference  44 inches    Waist to Hip Ratio  0.82 %    BMI (Calculated)  31.48    Triceps Skinfold  30 mm    % Body Fat  42.1 %    Grip Strength  35 kg    Flexibility  11 in    Single Leg Stand  22 seconds        Nutrition Therapy Plan and Nutrition Goals: Nutrition Therapy & Goals - 04/14/17 1150      Nutrition Therapy   Diet   Heart Healthy      Personal Nutrition Goals   Nutrition Goal  Pt to identify food quantities necessary to achieve weight loss of 6-15 lb (2.7-10.9 kg) at graduation from cardiac rehab.    Personal Goal #2  Pt to identify and limit food sources of saturated fat, trans fat, and sodium      Intervention Plan   Intervention  Prescribe, educate and counsel regarding individualized specific dietary modifications aiming towards targeted core components such as weight, hypertension, lipid management, diabetes, heart failure and other comorbidities.    Expected Outcomes  Short Term Goal: Understand basic principles of dietary content, such as calories, fat, sodium, cholesterol and nutrients.;Long Term Goal: Adherence to prescribed nutrition plan.       Nutrition Assessments: Nutrition Assessments - 04/14/17 1150      MEDFICTS Scores   Pre Score  51       Nutrition Goals Re-Evaluation:   Nutrition Goals Re-Evaluation:   Nutrition Goals Discharge (Final Nutrition Goals Re-Evaluation):   Psychosocial: Target Goals: Acknowledge presence or absence of significant depression and/or stress, maximize coping skills, provide positive support system. Participant is able to verbalize types and ability to use techniques and skills needed for reducing stress and depression.  Initial Review & Psychosocial Screening: Initial Psych Review & Screening - 04/13/17 1410      Initial Review   Current issues with  None Identified      Family Dynamics   Good Support System?  Yes Husband, family, friends       Barriers   Psychosocial barriers to participate in program  There are no identifiable barriers or psychosocial needs.      Screening Interventions   Interventions  Encouraged to exercise       Quality of Life Scores: Quality of Life - 04/13/17 1414      Quality of Life Scores   Health/Function Pre  21.13 %    Socioeconomic Pre  24.94 %    Psych/Spiritual Pre  26.57 %    Family Pre  28.8  %    GLOBAL Pre  24.19 %      Scores of 19 and below usually indicate a poorer quality of life in these areas.  A difference of  2-3 points is a clinically meaningful difference.  A difference of 2-3 points in the total score of the Quality of Life Index has been associated with significant improvement in overall quality of life, self-image, physical symptoms, and general health in studies assessing change in quality of life.  PHQ-9: Recent Review Flowsheet Data    Depression screen [facility] 2/9 04/19/2017 01/08/2017 09/04/2016 03/18/2016 02/19/2016   Decreased Interest 0 2 0 0 0   Down, Depressed, Hopeless  1 1 0 0 0   PHQ - 2 Score 1 3 0 0 0   Altered sleeping - 3 - - -   Tired, decreased energy - 2 - - -   Change in appetite - 3 - - -   Feeling bad or failure about yourself  - 0 - - -   Trouble concentrating - 2 - - -   Moving slowly or fidgety/restless - 2 - - -   Suicidal thoughts - 0 - - -   PHQ-9 Score - 15 - - -     Interpretation of Total Score  Total Score Depression Severity:  1-4 = Minimal depression, 5-9 = Mild depression, 10-14 = Moderate depression, 15-19 = Moderately severe depression, 20-27 = Severe depression   Psychosocial Evaluation and Intervention: Psychosocial Evaluation - 04/19/17 1419      Psychosocial Evaluation & Interventions   Interventions  Encouraged to exercise with the program and follow exercise prescription;Stress management education;Relaxation education    Comments  pt with known history of depression and health related anxiety demonstrates improved psychosocial barriers. pt symptoms and fear may somewhat limit participation.     Expected Outcomes  pt will exhibit improved outlook with good coping skills. pt will exhibit decreased health related anxiety and increased self confidence in self care/disease mangement.      Continue Psychosocial Services   No Follow up required       Psychosocial Re-Evaluation:   Psychosocial Discharge (Final  Psychosocial Re-Evaluation):   Vocational Rehabilitation: Provide vocational rehab assistance to qualifying candidates.   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 Participant will state understanding/return demonstration of topics presented.  Learning Barriers/Preferences: Learning Barriers/Preferences - 04/13/17 1409      Learning Barriers/Preferences   Learning Barriers  Sight    Learning Preferences  Written Material;Verbal Instruction       Education Topics: Count Your Pulse:  -Group instruction provided by verbal instruction, demonstration, patient participation and written materials to support subject.  Instructors address importance of being able to find your pulse and how to count your pulse when at home without a heart monitor.  Patients get hands on experience counting their pulse with staff help and individually.   Heart Attack, Angina, and Risk Factor Modification:  -Group instruction provided by verbal instruction, video, and written materials to support subject.  Instructors address signs and symptoms of angina and heart attacks.    Also discuss risk factors for heart disease and how to make changes to improve heart health risk factors.   Functional Fitness:  -Group instruction provided by verbal instruction, demonstration, patient participation, and written materials to support subject.  Instructors address safety measures for doing things around the house.  Discuss how to get up and down off the floor, how to pick things up properly, how to safely get out of a chair without assistance, and balance training.   Meditation and Mindfulness:  -Group instruction provided by verbal instruction, patient  participation, and written materials to support subject.  Instructor addresses importance of mindfulness and meditation practice to help reduce stress and improve awareness.  Instructor also leads participants through a meditation exercise.    Stretching for Flexibility and Mobility:  -Group instruction provided  by verbal instruction, patient participation, and written materials to support subject.  Instructors lead participants through series of stretches that are designed to increase flexibility thus improving mobility.  These stretches are additional exercise for major muscle groups that are typically performed during regular warm up and cool down.   Hands Only CPR:  -Group verbal, video, and participation provides a basic overview of AHA guidelines for community CPR. Role-play of emergencies allow participants the opportunity to practice calling for help and chest compression technique with discussion of AED use.   Hypertension: -Group verbal and written instruction that provides a basic overview of hypertension including the most recent diagnostic guidelines, risk factor reduction with self-care instructions and medication management.    Nutrition I class: Heart Healthy Eating:  -Group instruction provided by PowerPoint slides, verbal discussion, and written materials to support subject matter. The instructor gives an explanation and review of the Therapeutic Lifestyle Changes diet recommendations, which includes a discussion on lipid goals, dietary fat, sodium, fiber, plant stanol/sterol esters, sugar, and the components of a well-balanced, healthy diet.   Nutrition II class: Lifestyle Skills:  -Group instruction provided by PowerPoint slides, verbal discussion, and written materials to support subject matter. The instructor gives an explanation and review of label reading, grocery shopping for heart health, heart healthy recipe modifications, and ways to make healthier choices when eating  out.   Diabetes Question & Answer:  -Group instruction provided by PowerPoint slides, verbal discussion, and written materials to support subject matter. The instructor gives an explanation and review of diabetes co-morbidities, pre- and post-prandial blood glucose goals, pre-exercise blood glucose goals, signs, symptoms, and treatment of hypoglycemia and hyperglycemia, and foot care basics.   Diabetes Blitz:  -Group instruction provided by PowerPoint slides, verbal discussion, and written materials to support subject matter. The instructor gives an explanation and review of the physiology behind type 1 and type 2 diabetes, diabetes medications and rational behind using different medications, pre- and post-prandial blood glucose recommendations and Hemoglobin A1c goals, diabetes diet, and exercise including blood glucose guidelines for exercising safely.    Portion Distortion:  -Group instruction provided by PowerPoint slides, verbal discussion, written materials, and food models to support subject matter. The instructor gives an explanation of serving size versus portion size, changes in portions sizes over the last 20 years, and what consists of a serving from each food group.   Stress Management:  -Group instruction provided by verbal instruction, video, and written materials to support subject matter.  Instructors review role of stress in heart disease and how to cope with stress positively.     Exercising on Your Own:  -Group instruction provided by verbal instruction, power point, and written materials to support subject.  Instructors discuss benefits of exercise, components of exercise, frequency and intensity of exercise, and end points for exercise.  Also discuss use of nitroglycerin and activating EMS.  Review options of places to exercise outside of rehab.  Review guidelines for sex with heart disease.   Cardiac Drugs I:  -Group instruction provided by verbal instruction and  written materials to support subject.  Instructor reviews cardiac drug classes: antiplatelets, anticoagulants, beta blockers, and statins.  Instructor discusses reasons, side effects, and lifestyle considerations for each drug class.   Cardiac Drugs II:  -Group instruction provided by verbal instruction and written materials to support subject.  Instructor reviews cardiac drug classes: angiotensin converting enzyme inhibitors (ACE-I), angiotensin II receptor blockers (ARBs), nitrates, and calcium channel blockers.  Instructor discusses reasons, side  effects, and lifestyle considerations for each drug class.   Anatomy and Physiology of the Circulatory System:  Group verbal and written instruction and models provide basic cardiac anatomy and physiology, with the coronary electrical and arterial systems. Review of: AMI, Angina, Valve disease, Heart Failure, Peripheral Artery Disease, Cardiac Arrhythmia, Pacemakers, and the ICD.   Other Education:  -Group or individual verbal, written, or video instructions that support the educational goals of the cardiac rehab program.   Knowledge Questionnaire Score: Knowledge Questionnaire Score - 04/13/17 1408      Knowledge Questionnaire Score   Pre Score  20/24       Core Components/Risk Factors/Patient Goals at Admission: Personal Goals and Risk Factors at Admission - 04/13/17 1449      Core Components/Risk Factors/Patient Goals on Admission    Weight Management  Yes;Obesity;Weight Maintenance;Weight Loss    Intervention  Weight Management: Develop a combined nutrition and exercise program designed to reach desired caloric intake, while maintaining appropriate intake of nutrient and fiber, sodium and fats, and appropriate energy expenditure required for the weight goal.;Weight Management: Provide education and appropriate resources to help participant work on and attain dietary goals.;Weight Management/Obesity: Establish reasonable short term and long  term weight goals.;Obesity: Provide education and appropriate resources to help participant work on and attain dietary goals.    Admit Weight  172 lb 2.9 oz (78.1 kg)    Goal Weight: Short Term  165 lb (74.8 kg)    Goal Weight: Long Term  150 lb (68 kg)    Expected Outcomes  Short Term: Continue to assess and modify interventions until short term weight is achieved;Long Term: Adherence to nutrition and physical activity/exercise program aimed toward attainment of established weight goal;Weight Maintenance: Understanding of the daily nutrition guidelines, which includes 25-35% calories from fat, 7% or less cal from saturated fats, less than 200mg  cholesterol, less than 1.5gm of sodium, & 5 or more servings of fruits and vegetables daily;Weight Loss: Understanding of general recommendations for a balanced deficit meal plan, which promotes 1-2 lb weight loss per week and includes a negative energy balance of [phone] kcal/d;Understanding recommendations for meals to include 15-35% energy as protein, 25-35% energy from fat, 35-60% energy from carbohydrates, less than 200mg  of dietary cholesterol, 20-35 gm of total fiber daily;Understanding of distribution of calorie intake throughout the day with the consumption of 4-5 meals/snacks    Improve shortness of breath with ADL's  Yes    Intervention  Provide education, individualized exercise plan and daily activity instruction to help decrease symptoms of SOB with activities of daily living.    Expected Outcomes  Short Term: Achieves a reduction of symptoms when performing activities of daily living.    Hypertension  Yes    Intervention  Provide education on lifestyle modifcations including regular physical activity/exercise, weight management, moderate sodium restriction and increased consumption of fresh fruit, vegetables, and low fat dairy, alcohol moderation, and smoking cessation.;Monitor prescription use compliance.    Expected Outcomes  Short Term: Continued  assessment and intervention until BP is < 140/80mm HG in hypertensive participants. < 130/30mm HG in hypertensive participants with diabetes, heart failure or chronic kidney disease.;Long Term: Maintenance of blood pressure at goal levels.    Lipids  Yes    Intervention  Provide education and support for participant on nutrition & aerobic/resistive exercise along with prescribed medications to achieve LDL 70mg , HDL >40mg .    Expected Outcomes  Short Term: Participant states understanding of desired cholesterol values and is compliant with  medications prescribed. Participant is following exercise prescription and nutrition guidelines.;Long Term: Cholesterol controlled with medications as prescribed, with individualized exercise RX and with personalized nutrition plan. Value goals: LDL < 70mg , HDL > 40 mg.       Core Components/Risk Factors/Patient Goals Review:  Goals and Risk Factor Review    Row Name 04/19/17 1417             Core Components/Risk Factors/Patient Goals Review   Personal Goals Review  Weight Management/Obesity;Lipids;Hypertension       Review  pt with multiple CAD RF demonstrates eagerness to participate in CR activities. pt personal goals are to stablize angina and medication adjustments, feel comfortable, decrease fear/health related anxiety by increasing self care knowledge.         Expected Outcomes  pt will participate in CR exercise, nutrition and lifestyle modification opportunities to decrease overall RF.           Core Components/Risk Factors/Patient Goals at Discharge (Final Review):  Goals and Risk Factor Review - 04/19/17 1417      Core Components/Risk Factors/Patient Goals Review   Personal Goals Review  Weight Management/Obesity;Lipids;Hypertension    Review  pt with multiple CAD RF demonstrates eagerness to participate in CR activities. pt personal goals are to stablize angina and medication adjustments, feel comfortable, decrease fear/health related anxiety  by increasing self care knowledge.      Expected Outcomes  pt will participate in CR exercise, nutrition and lifestyle modification opportunities to decrease overall RF.        ITP Comments: ITP Comments    Row Name 04/13/17 1408 04/19/17 1415         ITP Comments  Dr. Armanda Magic, Medical Director   pt started group exercise class today.  pt tolerated light activity without difficulty.          Comments:

## 2017-04-22 ENCOUNTER — Telehealth: Payer: Self-pay | Admitting: Cardiology

## 2017-04-22 NOTE — Telephone Encounter (Signed)
Returned call to patient.She stated she has been having chest pressure off and on since her stent.Stated today when she had chest pressure B/P 189/74 pulse 63.Stated chest pressure lasted appox 20 sec.B/P now 114/58 pulse 57.No chest pressure. She has appointment with Dr.Crenshaw 05/18/17.Message sent to Dr.Crenshaw's RN for a sooner appointment.

## 2017-04-22 NOTE — Telephone Encounter (Signed)
[name] [name] is calling because her blood pressure is is going up and down . She took at 11:45 and it was 189/74 pulse 63, 10 minutes later it was 152/62 pulse 65 and 10 minutes later it dropped to 110/60 and pulse 57. Also she has been haviung some chest pressure. Please call    Thanks

## 2017-04-22 NOTE — Telephone Encounter (Signed)
Spoke with pt, the tightness she has in her chest can be at rest or exertion. It is usually high in the chest and makes her feel SOB. It will last 15 or so sec. She does notice it after eating and she has a lot of belching that will also help with the pressure. She is taking her protonix in the morning with her meal or after. Advised the patient to take the protonix 30 mins prior to eating and also she was given the okay to take one in the evening if needed. She will try this and call back if needed prior to the appointment on the 19th of February. Pt agreed with this plan.

## 2017-04-23 ENCOUNTER — Encounter (HOSPITAL_COMMUNITY)
Admission: RE | Admit: 2017-04-23 | Discharge: 2017-04-23 | Disposition: A | Discharge status: Home / Self Care | Payer: Medicare Other | Source: Ambulatory Visit | Attending: Cardiology | Admitting: Cardiology

## 2017-04-23 DIAGNOSIS — Z955 Presence of coronary angioplasty implant and graft: Secondary | ICD-10-CM | POA: Diagnosis not present

## 2017-04-23 NOTE — Progress Notes (Addendum)
Pt in for exercise today at 1;15.  Pt took one ntg earlier today at 12:30  for chest pain that she rated a 5. Pt was in the parking lot of her grandson school.  Pain eased off and she went into the school office.  Pt stated that she felt dizzy and light headed walking in but this resolved once she got to the office and stood still.  No further complaint of chest pain or dizziness.  Reviewed telephone note to the cardiologist office.  Pt given banana and water pt had not eaten since 10am.  Pt able to exercise with reduced workloads with no further complaints.  Husband accompanied pt .  Pt advised that when she takes NTG she needs to be sitting and limit activity due to the potential hypotension.  Pt has upcoming appt with Dr. Stanford Breed.  Instructed pt to begin keeping a diary of her chest pain to include duration until releived, activity level at the onset of discomfort and describe the type of pain.  This can be reviewed by Dr. Stanford Breed at the appt. For any unrelieved cp pt should call 911. Pt and husband verbalized understanding. Cherre Huger, BSN Cardiac and Training and development officer

## 2017-04-26 ENCOUNTER — Encounter (HOSPITAL_COMMUNITY)
Admission: RE | Admit: 2017-04-26 | Discharge: 2017-04-26 | Disposition: A | Discharge status: Home / Self Care | Payer: Medicare Other | Source: Ambulatory Visit | Attending: Cardiovascular Disease | Admitting: Cardiovascular Disease

## 2017-04-26 DIAGNOSIS — Z955 Presence of coronary angioplasty implant and graft: Secondary | ICD-10-CM

## 2017-04-26 NOTE — Progress Notes (Signed)
Daily Session Note  Patient Details  Name: [name] [name] MRN: [id number] Date of Birth: 03/19/1951 Referring Provider:     CARDIAC REHAB PHASE II ORIENTATION from 04/13/2017 in [facility]  Referring Provider  Crenshaw, Brian MD      Encounter Date: 04/26/2017  Check In: Session Check In - 04/26/17 1355      Check-In   Location  [facility]    Staff Present  Tyara Nevels, MS,ACSM CEP, Exercise Physiologist;Olinty Richards, MS, ACSM CEP, Exercise Physiologist;Carlette Carlton, RN, BSN;Amber Fair, MS, ACSM RCEP, Exercise Physiologist    Supervising physician immediately available to respond to emergencies  [facility] immediately available    Physician(s)  Dr. Grunz    Medication changes reported      No    Fall or balance concerns reported     No    Tobacco Cessation  No Change    Warm-up and Cool-down  Performed as group-led instruction    Resistance Training Performed  Yes    VAD Patient?  No      Pain Assessment   Currently in Pain?  No/denies    Multiple Pain Sites  No       Capillary Blood Glucose: No results found for this or any previous visit (from the past 24 hour(s)).    Social History   Tobacco Use  Smoking Status Former Smoker  . Packs/day: 0.50  . Years: 20.00  . Pack years: 10.00  . Last attempt to quit: 07/30/2004  . Years since quitting: 12.7  Smokeless Tobacco Never Used    Goals Met:  Exercise tolerated well  Goals Unmet:  Not Applicable  Comments: pt denies NTG use over weekend.  Pt states recent CP episodes have lasted 10-15 seconds at which time she took NTG.  Pt instructed on proper NTG use.  Pt advised to wait 5 minutes after pain begins to take NTG.  Take 1 every 5 minutes, no more than 3 in 15 minutes.  Pt offered encouragement, emotional support and reassurance.  Pt verbalized understanding.  , RN, BSN Cardiac Pulmonary Rehab    Dr. Traci Turner is Medical Director for  Cardiac Rehab at [facility]. 

## 2017-04-26 NOTE — Progress Notes (Signed)
Reviewed home exercise with pt today.  Pt plans to walk for exercise, 2x/week in addition to coming to cardiac rehab.  Reviewed THR, pulse, RPE, sign and symptoms, NTG use, and when to call 911 or MD.  Also discussed weather considerations and indoor options.  Pt voiced understanding.    Rayshad Riviello,MS,ACSM RCEP 

## 2017-04-26 NOTE — Progress Notes (Signed)
[name] [name] 66 y.o. female DOB: Jun 06, 1950 MRN: [id number]      Nutrition Note  1. Stented coronary artery 03/01/17 S/P DES Ostial Ramus    Nutrition Note Spoke with pt. Nutrition plan and survey reviewed with pt. Pt is following Step 1 of the Therapeutic Lifestyle Changes diet. Pt is pre-diabetes. Pt states "I guess I'm aware of I have pre-diabetes. I was on Metformin at one time." Pre-diabetes discussed. Pt expressed understanding of the information reviewed. Pt aware of nutrition education classes offered.  Nutrition Diagnosis ? Food-and nutrition-related knowledge deficit related to lack of exposure to information as related to diagnosis of: ? CVD ? Pre-DM ? Obesity related to excessive energy intake as evidenced by a BMI of 31.5  Nutrition Intervention ? Pt's individual nutrition plan reviewed with pt. ? Benefits of adopting Heart Healthy diet discussed when Medficts reviewed.  Nutrition Goal(s):  ? Pt to identify and limit food sources of saturated fat, trans fat, and sodium ? Pt to identify food quantities necessary to achieve weight loss of 6-15 lb (2.7-10.9 kg) at graduation from cardiac rehab.  Plan:  Pt to attend nutrition classes ? Nutrition I ? Nutrition II ? Portion Distortion  Will provide client-centered nutrition education as part of interdisciplinary care.   Monitor and evaluate progress toward nutrition goal with team.  Derek Mound, M.Ed, RD, LDN, CDE 04/26/2017 2:41 PM

## 2017-04-28 ENCOUNTER — Encounter: Payer: Self-pay | Admitting: *Deleted

## 2017-04-28 ENCOUNTER — Encounter (HOSPITAL_COMMUNITY)
Admission: RE | Admit: 2017-04-28 | Discharge: 2017-04-28 | Disposition: A | Discharge status: Home / Self Care | Payer: Medicare Other | Source: Ambulatory Visit | Attending: Cardiology | Admitting: Cardiology

## 2017-04-28 DIAGNOSIS — Z955 Presence of coronary angioplasty implant and graft: Secondary | ICD-10-CM | POA: Diagnosis not present

## 2017-04-28 LAB — HEPATIC FUNCTION PANEL
ALT: 16 IU/L (ref 0–32)
AST: 13 IU/L (ref 0–40)
Albumin: 4.1 g/dL (ref 3.6–4.8)
Alkaline Phosphatase: 73 IU/L (ref 39–117)
BILIRUBIN TOTAL: 0.5 mg/dL (ref 0.0–1.2)
BILIRUBIN, DIRECT: 0.15 mg/dL (ref 0.00–0.40)
Total Protein: 6.7 g/dL (ref 6.0–8.5)

## 2017-04-28 LAB — LIPID PANEL W/O CHOL/HDL RATIO
CHOLESTEROL TOTAL: 138 mg/dL (ref 100–199)
HDL: 49 mg/dL (ref >39)
LDL Calculated: 68 mg/dL (ref 0–99)
Triglycerides: 103 mg/dL (ref 0–149)
VLDL Cholesterol Cal: 21 mg/dL (ref 5–40)

## 2017-04-30 ENCOUNTER — Encounter (HOSPITAL_COMMUNITY)
Admission: RE | Admit: 2017-04-30 | Discharge: 2017-04-30 | Disposition: A | Discharge status: Home / Self Care | Payer: Medicare Other | Source: Ambulatory Visit | Attending: Cardiology | Admitting: Cardiology

## 2017-04-30 DIAGNOSIS — Z955 Presence of coronary angioplasty implant and graft: Secondary | ICD-10-CM | POA: Diagnosis present

## 2017-05-03 ENCOUNTER — Telehealth: Payer: Self-pay | Admitting: Cardiology

## 2017-05-03 ENCOUNTER — Encounter (HOSPITAL_COMMUNITY)
Admission: RE | Admit: 2017-05-03 | Discharge: 2017-05-03 | Disposition: A | Discharge status: Home / Self Care | Payer: Medicare Other | Source: Ambulatory Visit | Attending: Cardiology | Admitting: Cardiology

## 2017-05-03 DIAGNOSIS — Z955 Presence of coronary angioplasty implant and graft: Secondary | ICD-10-CM

## 2017-05-03 LAB — GLUCOSE, CAPILLARY: Glucose-Capillary: 110 mg/dL — ABNORMAL HIGH (ref 65–99)

## 2017-05-03 NOTE — Progress Notes (Signed)
OUTPATIENT CARDIAC REHAB  PMH:  DES OM  Primary Cardiologist: Dr. Stanford Breed  Pt arrived at cardiac  rehab c/o chest tightness walking into department from parking lot. Pt reports symptoms lasted 10-15 seconds however describes as anginal equivalent.  Chest tightness resolved however pt continued to c/o anxious "jittery"  feelings. Pt states she had herbalife shake with almonds prior to arrival.  Pt given banana.  Pt did not eat banana c/o no appetite.  PC to Cecilie Kicks, NP.  New order given for pt to increase Amlodipine 5mg  daily.  Pt given written and verbal instructions. Pt verbalized understanding.  Recheck BP:  120/60. Pt left with husband feeling well.  However came right back in department c/o dizziness.  BP:  140/60.  CBG: 110.  Pt walked track without difficulty.  Pt symptoms resolved. Pt declined offer to contact PCP for appointment.  Pt states she feels better. Pt plans to return to cardiac rehab for next scheduled session.  Pt advised to contact MD if symptoms return.  Present to ED for severe unrelieved symptoms.  Understanding verbalized.  Andi Hence, RN, BSN Cardiac Pulmonary Rehab 05/03/17  2:44 PM

## 2017-05-03 NOTE — Telephone Encounter (Signed)
Pt's chronic chest pain was increased today but lasted only 10 min. Prior to rehab.  She did not feel like exercising.  Her BP systolic was 383 --she will increase her amlodipine to 5 mg daily.  She does have appt 05/19/17 with Dr. Stanford Breed.

## 2017-05-04 NOTE — Progress Notes (Signed)
HPI: FU CAD. Also H/O MVP and orthostasis. Carotid Dopplers 2010 showed no significant stenosis.Echocardiogram repeated September 2016 and showed normal LV function. Patient had chest pain in November followed by abnormal nuclear study. Patient had cardiac catheterization December 2018 that showed an 80% ostial ramus and ejection fraction 50-55%. Patient had PCI with drug-eluting stent.  Seen with atypical chest pain January 2019 felt not to be cardiac.  Since last seen, patient denies dyspnea on exertion, orthopnea, PND, pedal edema, palpitations or syncope.  Patient denies exertional chest pain.  Current Outpatient Medications  Medication Sig Dispense Refill  . amLODipine (NORVASC) 2.5 MG tablet Take 1 tablet (2.5 mg total) by mouth daily. 30 tablet 6  . aspirin 81 MG tablet Take 81 mg by mouth daily.    [name] atorvastatin (LIPITOR) 40 MG tablet Take 1 tablet (40 mg total) by mouth daily. 30 tablet 5  . clopidogrel (PLAVIX) 75 MG tablet Take 1 tablet (75 mg total) by mouth daily with breakfast. 30 tablet 6  . cyclobenzaprine (FLEXERIL) 10 MG tablet TAKE 10 MG BY MOUTH AT BEDTIME FOR BACK PAIN AND AS SLEEP AID  1  . ferrous sulfate 325 (65 FE) MG tablet Take 1 tablet (325 mg total) by mouth daily with breakfast. 30 tablet 3  . fluticasone (FLONASE) 50 MCG/ACT nasal spray Place 1 spray daily into both nostrils.    . metoprolol tartrate (LOPRESSOR) 25 MG tablet Take 0.5 tablets (12.5 mg total) by mouth 2 (two) times daily. 180 tablet 4  . nitroGLYCERIN (NITROSTAT) 0.4 MG SL tablet Place 1 tablet (0.4 mg total) under the tongue every 5 (five) minutes as needed for chest pain. 25 tablet 5  . pantoprazole (PROTONIX) 40 MG tablet Take 1 tablet (40 mg total) by mouth 2 (two) times daily. 180 tablet 3  . sertraline (ZOLOFT) 100 MG tablet Take 1 tablet (100 mg total) by mouth daily. 30 tablet 3   No current facility-administered medications for this visit.      Past Medical History:  Diagnosis  Date  . Coronary artery disease   . Depression   . Hyperlipidemia   . Hypertension   . Microcytic anemia   . Mitral valve prolapse     Past Surgical History:  Procedure Laterality Date  . CARDIAC CATHETERIZATION    . CORONARY STENT INTERVENTION N/A 03/01/2017   Procedure: CORONARY STENT INTERVENTION;  Surgeon: Burnell Blanks, MD;  Location: [facility];  Service: Cardiovascular;  Laterality: N/A;  . INTRAVASCULAR PRESSURE WIRE/FFR STUDY N/A 03/01/2017   Procedure: INTRAVASCULAR PRESSURE WIRE/FFR STUDY;  Surgeon: Burnell Blanks, MD;  Location: [facility];  Service: Cardiovascular;  Laterality: N/A;  . LEFT HEART CATH AND CORONARY ANGIOGRAPHY N/A 03/01/2017   Procedure: LEFT HEART CATH AND CORONARY ANGIOGRAPHY;  Surgeon: Burnell Blanks, MD;  Location: [facility];  Service: Cardiovascular;  Laterality: N/A;  . PARTIAL HYSTERECTOMY      Social History   Socioeconomic History  . Marital status: Married    Spouse name: Not on file  . Number of children: 3  . Years of education: Not on file  . Highest education level: Not on file  Social Needs  . Financial resource strain: Not on file  . Food insecurity - worry: Not on file  . Food insecurity - inability: Not on file  . Transportation needs - medical: Not on file  . Transportation needs - non-medical: Not on file  Occupational History  Comment: Retired  Tobacco Use  . Smoking status: Former Smoker    Packs/day: 0.50    Years: 20.00    Pack years: 10.00    Last attempt to quit: 07/30/2004    Years since quitting: 12.8  . Smokeless tobacco: Never Used  Substance and Sexual Activity  . Alcohol use: Yes    Alcohol/week: 0.0 oz    Comment: Occasional  . Drug use: No  . Sexual activity: Yes    Partners: Male  Other Topics Concern  . Not on file  Social History Narrative  . Not on file    Family History  Problem Relation Age of Onset  . Heart disease Mother        CHF  .  Diabetes Mother   . Hypertension Mother   . Cancer Father   . Hypertension Sister   . Pancreatitis Sister   . Dementia Sister     ROS: no fevers or chills, productive cough, hemoptysis, dysphasia, odynophagia, melena, hematochezia, dysuria, hematuria, rash, seizure activity, orthopnea, PND, pedal edema, claudication. Remaining systems are negative.  Physical Exam: Well-developed well-nourished in no acute distress.  Skin is warm and dry.  HEENT is normal.  Neck is supple.  Chest is clear to auscultation with normal expansion.  Cardiovascular exam is regular rate and rhythm.  Abdominal exam nontender or distended. No masses palpated. Extremities show no edema. neuro grossly intact   A/P  1 coronary artery disease-continue aspirin, Plavix and statin.  Patient has had atypical chest pain since intervention.  No exertional symptoms.  No plans for further ischemia evaluation.  2 hypertension-blood pressure is controlled. Continue present medications.  Also note she has a history of orthostasis.  3 hyperlipidemia-continue statin.  4 history of orthostatic hypotension-no recent symptoms.  5 history of mitral valve prolapse-not evident on most recent echo.  [name], MD

## 2017-05-05 ENCOUNTER — Encounter (HOSPITAL_COMMUNITY)
Admission: RE | Admit: 2017-05-05 | Discharge: 2017-05-05 | Disposition: A | Discharge status: Home / Self Care | Payer: Medicare Other | Source: Ambulatory Visit | Attending: Cardiology | Admitting: Cardiology

## 2017-05-05 DIAGNOSIS — Z955 Presence of coronary angioplasty implant and graft: Secondary | ICD-10-CM | POA: Diagnosis not present

## 2017-05-07 ENCOUNTER — Encounter (HOSPITAL_COMMUNITY)
Admission: RE | Admit: 2017-05-07 | Discharge: 2017-05-07 | Disposition: A | Discharge status: Home / Self Care | Payer: Medicare Other | Source: Ambulatory Visit | Attending: Cardiology | Admitting: Cardiology

## 2017-05-07 DIAGNOSIS — Z955 Presence of coronary angioplasty implant and graft: Secondary | ICD-10-CM | POA: Diagnosis not present

## 2017-05-10 ENCOUNTER — Encounter (HOSPITAL_COMMUNITY)
Admission: RE | Admit: 2017-05-10 | Discharge: 2017-05-10 | Disposition: A | Discharge status: Home / Self Care | Payer: Medicare Other | Source: Ambulatory Visit | Attending: Cardiology | Admitting: Cardiology

## 2017-05-10 DIAGNOSIS — Z955 Presence of coronary angioplasty implant and graft: Secondary | ICD-10-CM | POA: Diagnosis not present

## 2017-05-12 ENCOUNTER — Encounter (HOSPITAL_COMMUNITY)
Admission: RE | Admit: 2017-05-12 | Discharge: 2017-05-12 | Disposition: A | Discharge status: Home / Self Care | Payer: Medicare Other | Source: Ambulatory Visit | Attending: Cardiology | Admitting: Cardiology

## 2017-05-12 DIAGNOSIS — Z955 Presence of coronary angioplasty implant and graft: Secondary | ICD-10-CM

## 2017-05-13 ENCOUNTER — Other Ambulatory Visit: Payer: Self-pay | Admitting: Cardiology

## 2017-05-13 MED ORDER — METOPROLOL TARTRATE 25 MG PO TABS: 12.5000 mg | ORAL_TABLET | Freq: Two times a day (BID) | ORAL | 4 refills | Status: DC

## 2017-05-13 MED ORDER — PANTOPRAZOLE SODIUM 40 MG PO TBEC: 40.0000 mg | DELAYED_RELEASE_TABLET | Freq: Every day | ORAL | 4 refills | Status: DC

## 2017-05-13 NOTE — Telephone Encounter (Signed)
°*  STAT* If patient is at the pharmacy, call can be transferred to refill team.   1. Which medications need to be refilled? (please list name of each medication and dose if known)Metoprolol Tartrate 25 mg, Protonix 40 mg   2. Which pharmacy/location (including street and city if local pharmacy) is medication to be sent to?[facility] on [address]   3. Do they need a 30 day or 90 day supply? [place]

## 2017-05-14 ENCOUNTER — Telehealth: Payer: Self-pay | Admitting: Cardiology

## 2017-05-14 ENCOUNTER — Encounter (HOSPITAL_COMMUNITY)
Admission: RE | Admit: 2017-05-14 | Discharge: 2017-05-14 | Disposition: A | Discharge status: Home / Self Care | Payer: Medicare Other | Source: Ambulatory Visit | Attending: Cardiology | Admitting: Cardiology

## 2017-05-14 DIAGNOSIS — Z955 Presence of coronary angioplasty implant and graft: Secondary | ICD-10-CM

## 2017-05-14 MED ORDER — PANTOPRAZOLE SODIUM 40 MG PO TBEC: 40.0000 mg | DELAYED_RELEASE_TABLET | Freq: Every day | ORAL | 3 refills | Status: DC

## 2017-05-14 NOTE — Telephone Encounter (Signed)
Pt said she did not get her Protonix yesterday,she got her Metoprolol.Please call her [facility]ton[facility] to [facility] [phone].

## 2017-05-14 NOTE — Telephone Encounter (Signed)
Refill sent to the pharmacy electronically.  

## 2017-05-17 ENCOUNTER — Encounter (HOSPITAL_COMMUNITY)
Admission: RE | Admit: 2017-05-17 | Discharge: 2017-05-17 | Disposition: A | Discharge status: Home / Self Care | Payer: Medicare Other | Source: Ambulatory Visit | Attending: Cardiology | Admitting: Cardiology

## 2017-05-17 DIAGNOSIS — Z955 Presence of coronary angioplasty implant and graft: Secondary | ICD-10-CM | POA: Diagnosis not present

## 2017-05-18 ENCOUNTER — Ambulatory Visit: Payer: Medicare Other | Admitting: Cardiology

## 2017-05-18 ENCOUNTER — Encounter: Payer: Self-pay | Admitting: Cardiology

## 2017-05-18 VITALS — BP 114/64 | HR 63 | Ht 62.0 in | Wt 167.0 lb

## 2017-05-18 DIAGNOSIS — I2510 Atherosclerotic heart disease of native coronary artery without angina pectoris: Secondary | ICD-10-CM | POA: Diagnosis not present

## 2017-05-18 DIAGNOSIS — I10 Essential (primary) hypertension: Secondary | ICD-10-CM | POA: Diagnosis not present

## 2017-05-18 DIAGNOSIS — E7800 Pure hypercholesterolemia, unspecified: Secondary | ICD-10-CM | POA: Diagnosis not present

## 2017-05-18 MED ORDER — PANTOPRAZOLE SODIUM 40 MG PO TBEC: 40.0000 mg | DELAYED_RELEASE_TABLET | Freq: Two times a day (BID) | ORAL | 3 refills | Status: DC

## 2017-05-18 NOTE — Patient Instructions (Signed)
Your physician wants you to follow-up in: 6 MONTHS WITH DR CRENSHAW You will receive a reminder letter in the mail two months in advance. If you don't receive a letter, please call our office to schedule the follow-up appointment.   If you need a refill on your cardiac medications before your next appointment, please call your pharmacy.  

## 2017-05-19 ENCOUNTER — Encounter (HOSPITAL_COMMUNITY)
Admission: RE | Admit: 2017-05-19 | Discharge: 2017-05-19 | Disposition: A | Discharge status: Home / Self Care | Payer: Medicare Other | Source: Ambulatory Visit | Attending: Cardiology | Admitting: Cardiology

## 2017-05-19 DIAGNOSIS — Z955 Presence of coronary angioplasty implant and graft: Secondary | ICD-10-CM

## 2017-05-20 ENCOUNTER — Encounter (HOSPITAL_COMMUNITY): Payer: Self-pay

## 2017-05-20 NOTE — Progress Notes (Signed)
Cardiac Individual Treatment Plan  Patient Details  Name: [name] [name] MRN: [id number] Date of Birth: 05/19/1950 Referring Provider:     CARDIAC REHAB PHASE II ORIENTATION from 04/13/2017 in [facility] CARDIAC [facility]  Referring Provider  Olga Millers MD      Initial Encounter Date:    CARDIAC REHAB PHASE II ORIENTATION from 04/13/2017 in [facility] CARDIAC REHAB  Date  04/13/17  Referring Provider  Olga Millers MD      Visit Diagnosis: Stented coronary artery 03/01/17 S/P DES Ostial Ramus  Patient's Home Medications on Admission:  Current Outpatient Medications:  .  amLODipine (NORVASC) 2.5 MG tablet, Take 1 tablet (2.5 mg total) by mouth daily., Disp: 30 tablet, Rfl: 6 .  aspirin 81 MG tablet, Take 81 mg by mouth daily., Disp: , Rfl:  .  atorvastatin (LIPITOR) 40 MG tablet, Take 1 tablet (40 mg total) by mouth daily., Disp: 30 tablet, Rfl: 5 .  clopidogrel (PLAVIX) 75 MG tablet, Take 1 tablet (75 mg total) by mouth daily with breakfast., Disp: 30 tablet, Rfl: 6 .  cyclobenzaprine (FLEXERIL) 10 MG tablet, TAKE 10 MG BY MOUTH AT BEDTIME FOR BACK PAIN AND AS SLEEP AID, Disp: , Rfl: 1 .  ferrous sulfate 325 (65 FE) MG tablet, Take 1 tablet (325 mg total) by mouth daily with breakfast., Disp: 30 tablet, Rfl: 3 .  fluticasone (FLONASE) 50 MCG/ACT nasal spray, Place 1 spray daily into both nostrils., Disp: , Rfl:  .  metoprolol tartrate (LOPRESSOR) 25 MG tablet, Take 0.5 tablets (12.5 mg total) by mouth 2 (two) times daily., Disp: 180 tablet, Rfl: 4 .  nitroGLYCERIN (NITROSTAT) 0.4 MG SL tablet, Place 1 tablet (0.4 mg total) under the tongue every 5 (five) minutes as needed for chest pain., Disp: 25 tablet, Rfl: 5 .  pantoprazole (PROTONIX) 40 MG tablet, Take 1 tablet (40 mg total) by mouth 2 (two) times daily., Disp: 180 tablet, Rfl: 3 .  sertraline (ZOLOFT) 100 MG tablet, Take 1 tablet (100 mg total) by mouth daily., Disp: 30 tablet, Rfl:  3  Past Medical History: Past Medical History:  Diagnosis Date  . Coronary artery disease   . Depression   . Hyperlipidemia   . Hypertension   . Microcytic anemia   . Mitral valve prolapse     Tobacco Use: Social History   Tobacco Use  Smoking Status Former Smoker  . Packs/day: 0.50  . Years: 20.00  . Pack years: 10.00  . Last attempt to quit: 07/30/2004  . Years since quitting: 12.8  Smokeless Tobacco Never Used    Labs: Recent Hydrographic surveyor    Labs for ITP Cardiac and Pulmonary Rehab Latest Ref Rng & Units 07/11/2015 11/22/2015 09/04/2016 02/11/2017 04/27/2017   Cholestrol 100 - 199 mg/dL - - - 829(F) 621   LDLCALC 0 - 99 mg/dL - - - 308(M) 68   LDLDIRECT mg/dL - - - - -   HDL >57 mg/dL - - - 55 49   Trlycerides 0 - 149 mg/dL - - - 846 962   Hemoglobin A1c - 5.7 5.9 5.8 - -      Capillary Blood Glucose: Lab Results  Component Value Date   GLUCAP 110 (H) 05/03/2017     Exercise Target Goals:    Exercise Program Goal: Individual exercise prescription set using results from initial 6 min walk test and THRR while considering  patient's activity barriers and safety.   Exercise Prescription Goal: Initial exercise  prescription builds to 30-45 minutes a day of aerobic activity, 2-3 days per week.  Home exercise guidelines will be given to patient during program as part of exercise prescription that the participant will acknowledge.  Activity Barriers & Risk Stratification: Activity Barriers & Cardiac Risk Stratification - 04/13/17 1451      Activity Barriers & Cardiac Risk Stratification   Activity Barriers  Muscular Weakness;Deconditioning    Cardiac Risk Stratification  High       6 Minute Walk: 6 Minute Walk    Row Name 04/13/17 1444 04/13/17 1457       6 Minute Walk   Phase  Initial  -    Distance  1659 feet  -    Walk Time  6 minutes  -    # of Rest Breaks  0  -    MPH  3.14  -    METS  3.5  -    RPE  11  -    VO2 Peak  12.1  -    Symptoms   No  -    Resting HR  58 bpm  -    Resting BP  114/72  -    Resting Oxygen Saturation   98 %  -    Exercise Oxygen Saturation  during 6 min walk  97 %  -    Max Ex. HR  98 bpm  -    Max Ex. BP  142/60  -    2 Minute Post BP  -  124/64       Oxygen Initial Assessment:   Oxygen Re-Evaluation:   Oxygen Discharge (Final Oxygen Re-Evaluation):   Initial Exercise Prescription: Initial Exercise Prescription - 04/13/17 1400      Date of Initial Exercise RX and Referring Provider   Date  04/13/17    Referring Provider  Olga Millers MD      Treadmill   MPH  2.5    Grade  1    Minutes  15    METs  3.26      Bike   Level  0.5    Minutes  15    METs  2.18      NuStep   Level  3    SPM  80    Minutes  10    METs  2      Prescription Details   Frequency (times per week)  3    Duration  Progress to 30 minutes of continuous aerobic without signs/symptoms of physical distress      Intensity   THRR 40-80% of Max Heartrate  67-122    Ratings of Perceived Exertion  11-13    Perceived Dyspnea  0-4      Progression   Progression  Continue to progress workloads to maintain intensity without signs/symptoms of physical distress.      Resistance Training   Training Prescription  Yes    Weight  2lbs    Reps  10-15       Perform Capillary Blood Glucose checks as needed.  Exercise Prescription Changes: Exercise Prescription Changes    Row Name 04/19/17 1157 05/03/17 1706 05/17/17 1348         Response to Exercise   Blood Pressure (Admit)  128/80  112/64  120/64     Blood Pressure (Exercise)  158/64  140/60  154/60     Blood Pressure (Exit)  100/54  104/60  114/62     Heart Rate (Admit)  72 bpm  82 bpm  71 bpm     Heart Rate (Exercise)  96 bpm  89 bpm  114 bpm     Heart Rate (Exit)  66 bpm  55 bpm  66 bpm     Rating of Perceived Exertion (Exercise)  11  11  11      Symptoms  none  none  none     Comments  pt was oriented to exercise equipment  pt was oriented to  exercise equipment  -     Duration  Continue with 30 min of aerobic exercise without signs/symptoms of physical distress.  Continue with 30 min of aerobic exercise without signs/symptoms of physical distress.  Continue with 30 min of aerobic exercise without signs/symptoms of physical distress.     Intensity  THRR unchanged  THRR unchanged  THRR unchanged       Progression   Progression  Continue to progress workloads to maintain intensity without signs/symptoms of physical distress.  Continue to progress workloads to maintain intensity without signs/symptoms of physical distress.  Continue to progress workloads to maintain intensity without signs/symptoms of physical distress.     Average METs  3  2.6  3.8       Resistance Training   Training Prescription  Yes  Yes  Yes     Weight  2lbs  3lbs  3lbs     Reps  10-15  10-15  10-15     Time  10 Minutes  10 Minutes  10 Minutes       Treadmill   MPH  2.5  -  2.8     Grade  1  -  2     Minutes  15  -  15     METs  3.26  -  3.91       Bike   Level  -  0.5  -     Minutes  -  15  -     METs  -  2.23  -       NuStep   Level  3  3  3      SPM  80  80  80     Minutes  10  10  15      METs  2.8  3  3.7       Home Exercise Plan   Plans to continue exercise at  -  -  Home (comment) walking     Frequency  -  -  Add 2 additional days to program exercise sessions.     Initial Home Exercises Provided  -  -  04/26/17        Exercise Comments: Exercise Comments    Row Name 04/19/17 1159 05/18/17 1352         Exercise Comments  Pt was oriented to exercise equipment on 04/19/17. Pt responded well to first exercise session. Pt will progress activity levels as tolerated.  Reviewed METs and goals. Pt is tolerating exercise program very well. Pt symptoms of chest discomfort is also improving. Pt will continue to exercise safely in cardiac in which we will progess activity as tolerated.          Exercise Goals and Review: Exercise Goals    Row  Name 04/13/17 1448             Exercise Goals   Increase Physical Activity  Yes       Intervention  Provide advice, education, support and counseling  about physical activity/exercise needs.;Develop an individualized exercise prescription for aerobic and resistive training based on initial evaluation findings, risk stratification, comorbidities and participant's personal goals.       Expected Outcomes  Achievement of increased cardiorespiratory fitness and enhanced flexibility, muscular endurance and strength shown through measurements of functional capacity and personal statement of participant.       Increase Strength and Stamina  Yes       Intervention  Provide advice, education, support and counseling about physical activity/exercise needs.;Develop an individualized exercise prescription for aerobic and resistive training based on initial evaluation findings, risk stratification, comorbidities and participant's personal goals.       Expected Outcomes  Achievement of increased cardiorespiratory fitness and enhanced flexibility, muscular endurance and strength shown through measurements of functional capacity and personal statement of participant.       Able to understand and use rate of perceived exertion (RPE) scale  Yes       Intervention  Provide education and explanation on how to use RPE scale       Expected Outcomes  Short Term: Able to use RPE daily in rehab to express subjective intensity level;Long Term:  Able to use RPE to guide intensity level when exercising independently       Knowledge and understanding of Target Heart Rate Range (THRR)  Yes       Intervention  Provide education and explanation of THRR including how the numbers were predicted and where they are located for reference       Expected Outcomes  Short Term: Able to use daily as guideline for intensity in rehab;Short Term: Able to state/look up THRR;Long Term: Able to use THRR to govern intensity when exercising  independently       Able to check pulse independently  Yes       Intervention  Provide education and demonstration on how to check pulse in carotid and radial arteries.;Review the importance of being able to check your own pulse for safety during independent exercise       Expected Outcomes  Short Term: Able to explain why pulse checking is important during independent exercise;Long Term: Able to check pulse independently and accurately       Understanding of Exercise Prescription  Yes       Intervention  Provide education, explanation, and written materials on patient's individual exercise prescription       Expected Outcomes  Short Term: Able to explain program exercise prescription;Long Term: Able to explain home exercise prescription to exercise independently          Exercise Goals Re-Evaluation : Exercise Goals Re-Evaluation    Row Name 04/21/17 1200 04/26/17 1522 05/18/17 1355 05/19/17 1624       Exercise Goal Re-Evaluation   Exercise Goals Review  Understanding of Exercise Prescription;Able to understand and use rate of perceived exertion (RPE) scale;Increase Strength and Stamina  Knowledge and understanding of Target Heart Rate Range (THRR);Able to understand and use rate of perceived exertion (RPE) scale;Increase Physical Activity;Understanding of Exercise Prescription;Increase Strength and Stamina;Able to check pulse independently  Knowledge and understanding of Target Heart Rate Range (THRR);Able to understand and use rate of perceived exertion (RPE) scale;Increase Physical Activity;Understanding of Exercise Prescription;Increase Strength and Stamina;Able to check pulse independently  -    Comments  Pt transitioned to exercise equipment very well and has great understanding of exercise flow and order. Pt also demonstrates proper use of RPE scale and can exercise continously for 30 minutes  Reviewed home  exercise with pt today.  Pt plans to walk for exercise, 2x/week in addition to coming  to cardiac rehab.  Reviewed THR, pulse, RPE, sign and symptoms, NTG use, and when to call 911 or MD.  Also discussed weather considerations and indoor options.  Pt voiced understanding.  Pt is making great progress in cardiac rehab. Pt has increase intensity on treadmill to 2.8/2. Pt is also walking at home for exercise  Pt is making great progress in cardiac rehab. Pt has increase intensity on treadmill to 2.8/2. Pt is also walking at home for exercise in which she is averaging ~7200 steps per day. Goal is get to 9000-10,000 per day.    Expected Outcomes  Pt will continue to build on cardiorespiratory fitness and muscular endurance/strength  Pt will continue to build on cardiorespiratory fitness and muscular endurance/strength by being compliant with home exercise program  Pt will continue to build on cardiorespiratory fitness and muscular endurance/strength by being compliant with home exercise program  Pt will continue to build on cardiorespiratory fitness and muscular endurance/strength by being compliant with home exercise program        Discharge Exercise Prescription (Final Exercise Prescription Changes): Exercise Prescription Changes - 05/17/17 1348      Response to Exercise   Blood Pressure (Admit)  120/64    Blood Pressure (Exercise)  154/60    Blood Pressure (Exit)  114/62    Heart Rate (Admit)  71 bpm    Heart Rate (Exercise)  114 bpm    Heart Rate (Exit)  66 bpm    Rating of Perceived Exertion (Exercise)  11    Symptoms  none    Duration  Continue with 30 min of aerobic exercise without signs/symptoms of physical distress.    Intensity  THRR unchanged      Progression   Progression  Continue to progress workloads to maintain intensity without signs/symptoms of physical distress.    Average METs  3.8      Resistance Training   Training Prescription  Yes    Weight  3lbs    Reps  10-15    Time  10 Minutes      Treadmill   MPH  2.8    Grade  2    Minutes  15    METs  3.91       NuStep   Level  3    SPM  80    Minutes  15    METs  3.7      Home Exercise Plan   Plans to continue exercise at  Home (comment) walking    Frequency  Add 2 additional days to program exercise sessions.    Initial Home Exercises Provided  04/26/17       Nutrition:  Target Goals: Understanding of nutrition guidelines, daily intake of sodium 1500mg , cholesterol 200mg , calories 30% from fat and 7% or less from saturated fats, daily to have 5 or more servings of fruits and vegetables.  Biometrics: Pre Biometrics - 04/13/17 1449      Pre Biometrics   Height  5\' 2"  (1.575 m)    Weight  172 lb 2.9 oz (78.1 kg)    Waist Circumference  36.25 inches    Hip Circumference  44 inches    Waist to Hip Ratio  0.82 %    BMI (Calculated)  31.48    Triceps Skinfold  30 mm    % Body Fat  42.1 %    Grip Strength  35 kg    Flexibility  11 in    Single Leg Stand  22 seconds        Nutrition Therapy Plan and Nutrition Goals: Nutrition Therapy & Goals - 04/14/17 1150      Nutrition Therapy   Diet  Heart Healthy      Personal Nutrition Goals   Nutrition Goal  Pt to identify food quantities necessary to achieve weight loss of 6-15 lb (2.7-10.9 kg) at graduation from cardiac rehab.    Personal Goal #2  Pt to identify and limit food sources of saturated fat, trans fat, and sodium      Intervention Plan   Intervention  Prescribe, educate and counsel regarding individualized specific dietary modifications aiming towards targeted core components such as weight, hypertension, lipid management, diabetes, heart failure and other comorbidities.    Expected Outcomes  Short Term Goal: Understand basic principles of dietary content, such as calories, fat, sodium, cholesterol and nutrients.;Long Term Goal: Adherence to prescribed nutrition plan.       Nutrition Assessments: Nutrition Assessments - 04/14/17 1150      MEDFICTS Scores   Pre Score  51       Nutrition Goals  Re-Evaluation:   Nutrition Goals Re-Evaluation:   Nutrition Goals Discharge (Final Nutrition Goals Re-Evaluation):   Psychosocial: Target Goals: Acknowledge presence or absence of significant depression and/or stress, maximize coping skills, provide positive support system. Participant is able to verbalize types and ability to use techniques and skills needed for reducing stress and depression.  Initial Review & Psychosocial Screening: Initial Psych Review & Screening - 04/13/17 1410      Initial Review   Current issues with  None Identified      Family Dynamics   Good Support System?  Yes Husband, family, friends       Barriers   Psychosocial barriers to participate in program  There are no identifiable barriers or psychosocial needs.      Screening Interventions   Interventions  Encouraged to exercise       Quality of Life Scores: Quality of Life - 04/13/17 1414      Quality of Life Scores   Health/Function Pre  21.13 %    Socioeconomic Pre  24.94 %    Psych/Spiritual Pre  26.57 %    Family Pre  28.8 %    GLOBAL Pre  24.19 %      Scores of 19 and below usually indicate a poorer quality of life in these areas.  A difference of  2-3 points is a clinically meaningful difference.  A difference of 2-3 points in the total score of the Quality of Life Index has been associated with significant improvement in overall quality of life, self-image, physical symptoms, and general health in studies assessing change in quality of life.  PHQ-9: Recent Review Flowsheet Data    Depression screen [facility] 2/9 04/19/2017 01/08/2017 09/04/2016 03/18/2016 02/19/2016   Decreased Interest 0 2 0 0 0   Down, Depressed, Hopeless 1 1 0 0 0   PHQ - 2 Score 1 3 0 0 0   Altered sleeping - 3 - - -   Tired, decreased energy - 2 - - -   Change in appetite - 3 - - -   Feeling bad or failure about yourself  - 0 - - -   Trouble concentrating - 2 - - -   Moving slowly or fidgety/restless - 2 - - -    Suicidal thoughts -  0 - - -   PHQ-9 Score - 15 - - -     Interpretation of Total Score  Total Score Depression Severity:  1-4 = Minimal depression, 5-9 = Mild depression, 10-14 = Moderate depression, 15-19 = Moderately severe depression, 20-27 = Severe depression   Psychosocial Evaluation and Intervention: Psychosocial Evaluation - 04/19/17 1419      Psychosocial Evaluation & Interventions   Interventions  Encouraged to exercise with the program and follow exercise prescription;Stress management education;Relaxation education    Comments  pt with known history of depression and health related anxiety demonstrates improved psychosocial barriers. pt symptoms and fear may somewhat limit participation.     Expected Outcomes  pt will exhibit improved outlook with good coping skills. pt will exhibit decreased health related anxiety and increased self confidence in self care/disease mangement.      Continue Psychosocial Services   No Follow up required       Psychosocial Re-Evaluation: Psychosocial Re-Evaluation    Row Name 05/20/17 1019             Psychosocial Re-Evaluation   Current issues with  Current Anxiety/Panic;Current Stress Concerns       Comments  pt with health related stress and anxiety.  Pt demonstrates some improvement in these symptoms with less anginal episodes. pt tolerating CR activity without difficulty.        Expected Outcomes  pt will exhibit improved outlook and coping skills.        Interventions  Stress management education;Encouraged to attend Cardiac Rehabilitation for the exercise;Relaxation education       Continue Psychosocial Services   Follow up required by staff       Comments  health related stress anxiety         Initial Review   Source of Stress Concerns  Poor Coping Skills;Chronic Illness;Unable to perform yard/household activities;Unable to participate in former interests or hobbies          Psychosocial Discharge (Final Psychosocial  Re-Evaluation): Psychosocial Re-Evaluation - 05/20/17 1019      Psychosocial Re-Evaluation   Current issues with  Current Anxiety/Panic;Current Stress Concerns    Comments  pt with health related stress and anxiety.  Pt demonstrates some improvement in these symptoms with less anginal episodes. pt tolerating CR activity without difficulty.     Expected Outcomes  pt will exhibit improved outlook and coping skills.     Interventions  Stress management education;Encouraged to attend Cardiac Rehabilitation for the exercise;Relaxation education    Continue Psychosocial Services   Follow up required by staff    Comments  health related stress anxiety      Initial Review   Source of Stress Concerns  Poor Coping Skills;Chronic Illness;Unable to perform yard/household activities;Unable to participate in former interests or hobbies       Vocational Rehabilitation: Provide vocational rehab assistance to qualifying candidates.   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 Participant will state understanding/return demonstration of topics presented.  Learning Barriers/Preferences: Learning Barriers/Preferences - 04/13/17 1409      Learning Barriers/Preferences   Learning Barriers  Sight    Learning Preferences  Written Material;Verbal Instruction       Education Topics: Count Your Pulse:  -Group  instruction provided by verbal instruction, demonstration, patient participation and written materials to support subject.  Instructors address importance of being able to find your pulse and how to count your pulse when at home without a heart monitor.  Patients get hands on experience counting their pulse with staff help and  individually.   CARDIAC REHAB PHASE II EXERCISE from 05/12/2017 in [facility] CARDIAC REHAB  Date  04/23/17  Educator  RN      Heart Attack, Angina, and Risk Factor Modification:  -Group instruction provided by verbal instruction, video, and written materials to support subject.  Instructors address signs and symptoms of angina and heart attacks.    Also discuss risk factors for heart disease and how to make changes to improve heart health risk factors.   Functional Fitness:  -Group instruction provided by verbal instruction, demonstration, patient participation, and written materials to support subject.  Instructors address safety measures for doing things around the house.  Discuss how to get up and down off the floor, how to pick things up properly, how to safely get out of a chair without assistance, and balance training.   Meditation and Mindfulness:  -Group instruction provided by verbal instruction, patient participation, and written materials to support subject.  Instructor addresses importance of mindfulness and meditation practice to help reduce stress and improve awareness.  Instructor also leads participants through a meditation exercise.    Stretching for Flexibility and Mobility:  -Group instruction provided by verbal instruction, patient participation, and written materials to support subject.  Instructors lead participants through series of stretches that are designed to increase flexibility thus improving mobility.  These stretches are additional exercise for major muscle groups that are typically performed during regular warm up and cool down.   Hands Only CPR:  -Group verbal, video, and participation provides a basic overview of AHA guidelines for community CPR. Role-play of emergencies allow participants the opportunity to practice calling for help and chest compression technique with discussion of AED use.   Hypertension: -Group verbal and written  instruction that provides a basic overview of hypertension including the most recent diagnostic guidelines, risk factor reduction with self-care instructions and medication management.    Nutrition I class: Heart Healthy Eating:  -Group instruction provided by PowerPoint slides, verbal discussion, and written materials to support subject matter. The instructor gives an explanation and review of the Therapeutic Lifestyle Changes diet recommendations, which includes a discussion on lipid goals, dietary fat, sodium, fiber, plant stanol/sterol esters, sugar, and the components of a well-balanced, healthy diet.   Nutrition II class: Lifestyle Skills:  -Group instruction provided by PowerPoint slides, verbal discussion, and written materials to support subject matter. The instructor gives an explanation and review of label reading, grocery shopping for heart health, heart healthy recipe modifications, and ways to make healthier choices when eating out.   Diabetes Question & Answer:  -Group instruction provided by PowerPoint slides, verbal discussion, and written materials to support subject matter. The instructor gives an explanation and review of diabetes co-morbidities, pre- and post-prandial blood glucose goals, pre-exercise blood glucose goals, signs, symptoms, and treatment of hypoglycemia and hyperglycemia, and foot care basics.   Diabetes Blitz:  -Group instruction provided by PowerPoint slides, verbal discussion, and written materials to support subject matter. The instructor gives an explanation and review of the physiology behind type 1 and type 2 diabetes, diabetes medications and rational behind using different medications, pre- and post-prandial blood glucose recommendations and Hemoglobin A1c goals, diabetes diet, and  exercise including blood glucose guidelines for exercising safely.    Portion Distortion:  -Group instruction provided by PowerPoint slides, verbal discussion, written  materials, and food models to support subject matter. The instructor gives an explanation of serving size versus portion size, changes in portions sizes over the last 20 years, and what consists of a serving from each food group.   CARDIAC REHAB PHASE II EXERCISE from 05/12/2017 in [facility] CARDIAC REHAB  Date  04/28/17  Educator  RD  Instruction Review Code  2- Demonstrated Understanding      Stress Management:  -Group instruction provided by verbal instruction, video, and written materials to support subject matter.  Instructors review role of stress in heart disease and how to cope with stress positively.     Exercising on Your Own:  -Group instruction provided by verbal instruction, power point, and written materials to support subject.  Instructors discuss benefits of exercise, components of exercise, frequency and intensity of exercise, and end points for exercise.  Also discuss use of nitroglycerin and activating EMS.  Review options of places to exercise outside of rehab.  Review guidelines for sex with heart disease.   Cardiac Drugs I:  -Group instruction provided by verbal instruction and written materials to support subject.  Instructor reviews cardiac drug classes: antiplatelets, anticoagulants, beta blockers, and statins.  Instructor discusses reasons, side effects, and lifestyle considerations for each drug class.   Cardiac Drugs II:  -Group instruction provided by verbal instruction and written materials to support subject.  Instructor reviews cardiac drug classes: angiotensin converting enzyme inhibitors (ACE-I), angiotensin II receptor blockers (ARBs), nitrates, and calcium channel blockers.  Instructor discusses reasons, side effects, and lifestyle considerations for each drug class.   CARDIAC REHAB PHASE II EXERCISE from 05/12/2017 in [facility] CARDIAC REHAB  Date  05/12/17  Instruction Review Code  2- Demonstrated Understanding       Anatomy and Physiology of the Circulatory System:  Group verbal and written instruction and models provide basic cardiac anatomy and physiology, with the coronary electrical and arterial systems. Review of: AMI, Angina, Valve disease, Heart Failure, Peripheral Artery Disease, Cardiac Arrhythmia, Pacemakers, and the ICD.   Other Education:  -Group or individual verbal, written, or video instructions that support the educational goals of the cardiac rehab program.   Holiday Eating Survival Tips:  -Group instruction provided by PowerPoint slides, verbal discussion, and written materials to support subject matter. The instructor gives patients tips, tricks, and techniques to help them not only survive but enjoy the holidays despite the onslaught of food that accompanies the holidays.   Knowledge Questionnaire Score: Knowledge Questionnaire Score - 04/13/17 1408      Knowledge Questionnaire Score   Pre Score  20/24       Core Components/Risk Factors/Patient Goals at Admission: Personal Goals and Risk Factors at Admission - 04/13/17 1449      Core Components/Risk Factors/Patient Goals on Admission    Weight Management  Yes;Obesity;Weight Maintenance;Weight Loss    Intervention  Weight Management: Develop a combined nutrition and exercise program designed to reach desired caloric intake, while maintaining appropriate intake of nutrient and fiber, sodium and fats, and appropriate energy expenditure required for the weight goal.;Weight Management: Provide education and appropriate resources to help participant work on and attain dietary goals.;Weight Management/Obesity: Establish reasonable short term and long term weight goals.;Obesity: Provide education and appropriate resources to help participant work on and attain dietary goals.    Admit Weight  172  lb 2.9 oz (78.1 kg)    Goal Weight: Short Term  165 lb (74.8 kg)    Goal Weight: Long Term  150 lb (68 kg)    Expected Outcomes  Short  Term: Continue to assess and modify interventions until short term weight is achieved;Long Term: Adherence to nutrition and physical activity/exercise program aimed toward attainment of established weight goal;Weight Maintenance: Understanding of the daily nutrition guidelines, which includes 25-35% calories from fat, 7% or less cal from saturated fats, less than 200mg  cholesterol, less than 1.5gm of sodium, & 5 or more servings of fruits and vegetables daily;Weight Loss: Understanding of general recommendations for a balanced deficit meal plan, which promotes 1-2 lb weight loss per week and includes a negative energy balance of [phone] kcal/d;Understanding recommendations for meals to include 15-35% energy as protein, 25-35% energy from fat, 35-60% energy from carbohydrates, less than 200mg  of dietary cholesterol, 20-35 gm of total fiber daily;Understanding of distribution of calorie intake throughout the day with the consumption of 4-5 meals/snacks    Improve shortness of breath with ADL's  Yes    Intervention  Provide education, individualized exercise plan and daily activity instruction to help decrease symptoms of SOB with activities of daily living.    Expected Outcomes  Short Term: Achieves a reduction of symptoms when performing activities of daily living.    Hypertension  Yes    Intervention  Provide education on lifestyle modifcations including regular physical activity/exercise, weight management, moderate sodium restriction and increased consumption of fresh fruit, vegetables, and low fat dairy, alcohol moderation, and smoking cessation.;Monitor prescription use compliance.    Expected Outcomes  Short Term: Continued assessment and intervention until BP is < 140/25mm HG in hypertensive participants. < 130/77mm HG in hypertensive participants with diabetes, heart failure or chronic kidney disease.;Long Term: Maintenance of blood pressure at goal levels.    Lipids  Yes    Intervention  Provide  education and support for participant on nutrition & aerobic/resistive exercise along with prescribed medications to achieve LDL 70mg , HDL >40mg .    Expected Outcomes  Short Term: Participant states understanding of desired cholesterol values and is compliant with medications prescribed. Participant is following exercise prescription and nutrition guidelines.;Long Term: Cholesterol controlled with medications as prescribed, with individualized exercise RX and with personalized nutrition plan. Value goals: LDL < 70mg , HDL > 40 mg.       Core Components/Risk Factors/Patient Goals Review:  Goals and Risk Factor Review    Row Name 04/19/17 1417 05/20/17 1017           Core Components/Risk Factors/Patient Goals Review   Personal Goals Review  Weight Management/Obesity;Lipids;Hypertension  Weight Management/Obesity;Lipids;Hypertension      Review  pt with multiple CAD RF demonstrates eagerness to participate in CR activities. pt personal goals are to stablize angina and medication adjustments, feel comfortable, decrease fear/health related anxiety by increasing self care knowledge.    pt with multiple CAD RF demonstrates eagerness to participate in CR activities. pt health related anxiety improving as symptoms decrease and confidence increases.  pt demonstrates compliance with medication and lifestyle managment.         Expected Outcomes  pt will participate in CR exercise, nutrition and lifestyle modification opportunities to decrease overall RF.   pt will participate in CR exercise, nutrition and lifestyle modification opportunities to decrease overall RF.          Core Components/Risk Factors/Patient Goals at Discharge (Final Review):  Goals and Risk Factor Review - 05/20/17 1017  Core Components/Risk Factors/Patient Goals Review   Personal Goals Review  Weight Management/Obesity;Lipids;Hypertension    Review  pt with multiple CAD RF demonstrates eagerness to participate in CR activities.  pt health related anxiety improving as symptoms decrease and confidence increases.  pt demonstrates compliance with medication and lifestyle managment.       Expected Outcomes  pt will participate in CR exercise, nutrition and lifestyle modification opportunities to decrease overall RF.        ITP Comments: ITP Comments    Row Name 04/13/17 1408 04/19/17 1415 05/20/17 1015       ITP Comments  Dr. Armanda Magic, Medical Director   pt started group exercise class today.  pt tolerated light activity without difficulty.   30 day ITP review. pt with health related anxiety demonstrates willingness to participate in CR opportunities with good attendance and participation.         Comments:

## 2017-05-21 ENCOUNTER — Encounter (HOSPITAL_COMMUNITY)
Admission: RE | Admit: 2017-05-21 | Discharge: 2017-05-21 | Disposition: A | Discharge status: Home / Self Care | Payer: Medicare Other | Source: Ambulatory Visit | Attending: Cardiology | Admitting: Cardiology

## 2017-05-21 DIAGNOSIS — Z955 Presence of coronary angioplasty implant and graft: Secondary | ICD-10-CM | POA: Diagnosis not present

## 2017-05-24 ENCOUNTER — Encounter (HOSPITAL_COMMUNITY)
Admission: RE | Admit: 2017-05-24 | Discharge: 2017-05-24 | Disposition: A | Discharge status: Home / Self Care | Payer: Medicare Other | Source: Ambulatory Visit | Attending: Cardiology | Admitting: Cardiology

## 2017-05-24 DIAGNOSIS — Z955 Presence of coronary angioplasty implant and graft: Secondary | ICD-10-CM | POA: Diagnosis not present

## 2017-05-26 ENCOUNTER — Encounter (HOSPITAL_COMMUNITY)
Admission: RE | Admit: 2017-05-26 | Discharge: 2017-05-26 | Disposition: A | Discharge status: Home / Self Care | Payer: Medicare Other | Source: Ambulatory Visit | Attending: Cardiology | Admitting: Cardiology

## 2017-05-26 DIAGNOSIS — Z955 Presence of coronary angioplasty implant and graft: Secondary | ICD-10-CM | POA: Diagnosis not present

## 2017-05-28 ENCOUNTER — Encounter (HOSPITAL_COMMUNITY)
Admission: RE | Admit: 2017-05-28 | Discharge: 2017-05-28 | Disposition: A | Discharge status: Home / Self Care | Payer: Medicare Other | Source: Ambulatory Visit | Attending: Cardiology | Admitting: Cardiology

## 2017-05-28 DIAGNOSIS — Z955 Presence of coronary angioplasty implant and graft: Secondary | ICD-10-CM | POA: Insufficient documentation

## 2017-05-31 ENCOUNTER — Encounter (HOSPITAL_COMMUNITY)
Admission: RE | Admit: 2017-05-31 | Discharge: 2017-05-31 | Disposition: A | Discharge status: Home / Self Care | Payer: Medicare Other | Source: Ambulatory Visit | Attending: Cardiology | Admitting: Cardiology

## 2017-05-31 DIAGNOSIS — Z955 Presence of coronary angioplasty implant and graft: Secondary | ICD-10-CM | POA: Diagnosis not present

## 2017-06-02 ENCOUNTER — Encounter (HOSPITAL_COMMUNITY)
Admission: RE | Admit: 2017-06-02 | Discharge: 2017-06-02 | Disposition: A | Discharge status: Home / Self Care | Payer: Medicare Other | Source: Ambulatory Visit | Attending: Cardiology | Admitting: Cardiology

## 2017-06-02 DIAGNOSIS — Z955 Presence of coronary angioplasty implant and graft: Secondary | ICD-10-CM | POA: Diagnosis not present

## 2017-06-04 ENCOUNTER — Encounter (HOSPITAL_COMMUNITY)
Admission: RE | Admit: 2017-06-04 | Discharge: 2017-06-04 | Disposition: A | Discharge status: Home / Self Care | Payer: Medicare Other | Source: Ambulatory Visit | Attending: Cardiology | Admitting: Cardiology

## 2017-06-04 DIAGNOSIS — Z955 Presence of coronary angioplasty implant and graft: Secondary | ICD-10-CM

## 2017-06-07 ENCOUNTER — Telehealth (HOSPITAL_COMMUNITY): Payer: Self-pay | Admitting: Family Medicine

## 2017-06-07 ENCOUNTER — Encounter (HOSPITAL_COMMUNITY): Payer: Medicare Other

## 2017-06-09 ENCOUNTER — Encounter (HOSPITAL_COMMUNITY)
Admission: RE | Admit: 2017-06-09 | Discharge: 2017-06-09 | Disposition: A | Discharge status: Home / Self Care | Payer: Medicare Other | Source: Ambulatory Visit | Attending: Cardiology | Admitting: Cardiology

## 2017-06-09 ENCOUNTER — Other Ambulatory Visit: Payer: Self-pay

## 2017-06-09 DIAGNOSIS — Z955 Presence of coronary angioplasty implant and graft: Secondary | ICD-10-CM

## 2017-06-10 MED ORDER — FERROUS SULFATE 325 (65 FE) MG PO TABS: 325.0000 mg | ORAL_TABLET | Freq: Every day | ORAL | 3 refills | Status: DC

## 2017-06-11 ENCOUNTER — Encounter (HOSPITAL_COMMUNITY)
Admission: RE | Admit: 2017-06-11 | Discharge: 2017-06-11 | Disposition: A | Discharge status: Home / Self Care | Payer: Medicare Other | Source: Ambulatory Visit | Attending: Cardiology | Admitting: Cardiology

## 2017-06-11 DIAGNOSIS — Z955 Presence of coronary angioplasty implant and graft: Secondary | ICD-10-CM | POA: Diagnosis not present

## 2017-06-14 ENCOUNTER — Encounter (HOSPITAL_COMMUNITY)
Admission: RE | Admit: 2017-06-14 | Discharge: 2017-06-14 | Disposition: A | Discharge status: Home / Self Care | Payer: Medicare Other | Source: Ambulatory Visit | Attending: Cardiology | Admitting: Cardiology

## 2017-06-14 DIAGNOSIS — Z955 Presence of coronary angioplasty implant and graft: Secondary | ICD-10-CM

## 2017-06-16 ENCOUNTER — Encounter (HOSPITAL_COMMUNITY)
Admission: RE | Admit: 2017-06-16 | Discharge: 2017-06-16 | Disposition: A | Discharge status: Home / Self Care | Payer: Medicare Other | Source: Ambulatory Visit | Attending: Cardiology | Admitting: Cardiology

## 2017-06-16 DIAGNOSIS — Z955 Presence of coronary angioplasty implant and graft: Secondary | ICD-10-CM | POA: Diagnosis not present

## 2017-06-17 ENCOUNTER — Encounter (HOSPITAL_COMMUNITY): Payer: Self-pay

## 2017-06-17 NOTE — Progress Notes (Signed)
Cardiac Individual Treatment Plan  Patient Details  Name: [name] [name] MRN: [id number] Date of Birth: 1950/04/29 Referring Provider:   Flowsheet Row CARDIAC REHAB PHASE II ORIENTATION from 04/13/2017 in [facility] CARDIAC REHAB  Referring Provider  Olga Millers MD      Initial Encounter Date:  Flowsheet Row CARDIAC REHAB PHASE II ORIENTATION from 04/13/2017 in [facility] CARDIAC REHAB  Date  04/13/17  Referring Provider  Olga Millers MD      Visit Diagnosis: Stented coronary artery 03/01/17 S/P DES Ostial Ramus  Patient's Home Medications on Admission:  Current Outpatient Medications:  .  amLODipine (NORVASC) 2.5 MG tablet, Take 1 tablet (2.5 mg total) by mouth daily., Disp: 30 tablet, Rfl: 6 .  aspirin 81 MG tablet, Take 81 mg by mouth daily., Disp: , Rfl:  .  atorvastatin (LIPITOR) 40 MG tablet, Take 1 tablet (40 mg total) by mouth daily., Disp: 30 tablet, Rfl: 5 .  clopidogrel (PLAVIX) 75 MG tablet, Take 1 tablet (75 mg total) by mouth daily with breakfast., Disp: 30 tablet, Rfl: 6 .  cyclobenzaprine (FLEXERIL) 10 MG tablet, TAKE 10 MG BY MOUTH AT BEDTIME FOR BACK PAIN AND AS SLEEP AID, Disp: , Rfl: 1 .  ferrous sulfate 325 (65 FE) MG tablet, Take 1 tablet (325 mg total) by mouth daily with breakfast., Disp: 30 tablet, Rfl: 3 .  fluticasone (FLONASE) 50 MCG/ACT nasal spray, Place 1 spray daily into both nostrils., Disp: , Rfl:  .  metoprolol tartrate (LOPRESSOR) 25 MG tablet, Take 0.5 tablets (12.5 mg total) by mouth 2 (two) times daily., Disp: 180 tablet, Rfl: 4 .  nitroGLYCERIN (NITROSTAT) 0.4 MG SL tablet, Place 1 tablet (0.4 mg total) under the tongue every 5 (five) minutes as needed for chest pain., Disp: 25 tablet, Rfl: 5 .  pantoprazole (PROTONIX) 40 MG tablet, Take 1 tablet (40 mg total) by mouth 2 (two) times daily., Disp: 180 tablet, Rfl: 3 .  sertraline (ZOLOFT) 100 MG tablet, Take 1 tablet (100 mg total) by mouth daily.,  Disp: 30 tablet, Rfl: 3  Past Medical History: Past Medical History:  Diagnosis Date  . Coronary artery disease   . Depression   . Hyperlipidemia   . Hypertension   . Microcytic anemia   . Mitral valve prolapse     Tobacco Use: Social History   Tobacco Use  Smoking Status Former Smoker  . Packs/day: 0.50  . Years: 20.00  . Pack years: 10.00  . Last attempt to quit: 07/30/2004  . Years since quitting: 12.8  Smokeless Tobacco Never Used    Labs: Recent Hydrographic surveyor    Labs for ITP Cardiac and Pulmonary Rehab Latest Ref Rng & Units 07/11/2015 11/22/2015 09/04/2016 02/11/2017 04/27/2017   Cholestrol 100 - 199 mg/dL - - - 782(N) 562   LDLCALC 0 - 99 mg/dL - - - 130(Q) 68   LDLDIRECT mg/dL - - - - -   HDL >65 mg/dL - - - 55 49   Trlycerides 0 - 149 mg/dL - - - 784 696   Hemoglobin A1c - 5.7 5.9 5.8 - -      Capillary Blood Glucose: Lab Results  Component Value Date   GLUCAP 110 (H) 05/03/2017     Exercise Target Goals:    Exercise Program Goal: Individual exercise prescription set using results from initial 6 min walk test and THRR while considering  patient's activity barriers and safety.   Exercise Prescription Goal: Initial exercise  prescription builds to 30-45 minutes a day of aerobic activity, 2-3 days per week.  Home exercise guidelines will be given to patient during program as part of exercise prescription that the participant will acknowledge.  Activity Barriers & Risk Stratification: Activity Barriers & Cardiac Risk Stratification - 04/13/17 1451    Activity Barriers & Cardiac Risk Stratification          Activity Barriers  Muscular Weakness;Deconditioning    Cardiac Risk Stratification  High           6 Minute Walk: 6 Minute Walk    6 Minute Walk    Row Name 04/13/17 1444 04/13/17 1457   Phase  Initial  no documentation   Distance  1659 feet  no documentation   Walk Time  6 minutes  no documentation   # of Rest Breaks  0  no documentation    MPH  3.14  no documentation   METS  3.5  no documentation   RPE  11  no documentation   VO2 Peak  12.1  no documentation   Symptoms  No  no documentation   Resting HR  58 bpm  no documentation   Resting BP  114/72  no documentation   Resting Oxygen Saturation   98 %  no documentation   Exercise Oxygen Saturation  during 6 min walk  97 %  no documentation   Max Ex. HR  98 bpm  no documentation   Max Ex. BP  142/60  no documentation   2 Minute Post BP  no documentation  124/64          Oxygen Initial Assessment:   Oxygen Re-Evaluation:   Oxygen Discharge (Final Oxygen Re-Evaluation):   Initial Exercise Prescription: Initial Exercise Prescription - 04/13/17 1400    Date of Initial Exercise RX and Referring Provider          Date  04/13/17    Referring Provider  Olga Millers MD        Treadmill          MPH  2.5    Grade  1    Minutes  15    METs  3.26        Bike          Level  0.5    Minutes  15    METs  2.18        NuStep          Level  3    SPM  80    Minutes  10    METs  2        Prescription Details          Frequency (times per week)  3    Duration  Progress to 30 minutes of continuous aerobic without signs/symptoms of physical distress        Intensity          THRR 40-80% of Max Heartrate  67-122    Ratings of Perceived Exertion  11-13    Perceived Dyspnea  0-4        Progression          Progression  Continue to progress workloads to maintain intensity without signs/symptoms of physical distress.        Resistance Training          Training Prescription  Yes    Weight  2lbs    Reps  10-15  Perform Capillary Blood Glucose checks as needed.  Exercise Prescription Changes: Exercise Prescription Changes    Response to Exercise    Row Name 04/19/17 1157 05/03/17 1706 05/17/17 1348 06/02/17 1554 06/14/17 1404   Blood Pressure (Admit)  128/80  112/64  120/64  118/60  124/60   Blood Pressure (Exercise)  158/64   140/60  154/60  134/60  166/70   Blood Pressure (Exit)  100/54  104/60  114/62  100/60  104/60   Heart Rate (Admit)  72 bpm  82 bpm  71 bpm  66 bpm  70 bpm   Heart Rate (Exercise)  96 bpm  89 bpm  114 bpm  106 bpm  119 bpm   Heart Rate (Exit)  66 bpm  55 bpm  66 bpm  58 bpm  61 bpm   Rating of Perceived Exertion (Exercise)  11  11  11  12  11    Symptoms  none  none  none  none  none   Comments  pt was oriented to exercise equipment  pt was oriented to exercise equipment  no documentation  no documentation  no documentation   Duration  Continue with 30 min of aerobic exercise without signs/symptoms of physical distress.  Continue with 30 min of aerobic exercise without signs/symptoms of physical distress.  Continue with 30 min of aerobic exercise without signs/symptoms of physical distress.  Continue with 30 min of aerobic exercise without signs/symptoms of physical distress.  Continue with 30 min of aerobic exercise without signs/symptoms of physical distress.   Intensity  THRR unchanged  THRR unchanged  THRR unchanged  THRR unchanged  THRR unchanged       Progression    Row Name 04/19/17 1157 05/03/17 1706 05/17/17 1348 06/02/17 1554 06/14/17 1404   Progression  Continue to progress workloads to maintain intensity without signs/symptoms of physical distress.  Continue to progress workloads to maintain intensity without signs/symptoms of physical distress.  Continue to progress workloads to maintain intensity without signs/symptoms of physical distress.  Continue to progress workloads to maintain intensity without signs/symptoms of physical distress.  Continue to progress workloads to maintain intensity without signs/symptoms of physical distress.   Average METs  3  2.6  3.8  4.2  4.2       Resistance Training    Row Name 04/19/17 1157 05/03/17 1706 05/17/17 1348 06/02/17 1554 06/14/17 1404   Training Prescription  Yes  Yes  Yes  No relaxation day  Yes   Weight  2lbs  3lbs  3lbs  no  documentation  3lbs   Reps  10-15  10-15  10-15  no documentation  10-15   Time  10 Minutes  10 Minutes  10 Minutes  no documentation  10 Minutes       Treadmill    Row Name 04/19/17 1157 05/03/17 1706 05/17/17 1348 06/02/17 1554 06/14/17 1404   MPH  2.5  no documentation  2.8  3.2  3.2   Grade  1  no documentation  2  2  2    Minutes  15  no documentation  15  15  15    METs  3.26  no documentation  3.91  4.33  4.33       Bike    Row Name 04/19/17 1157 05/03/17 1706 05/17/17 1348 06/02/17 1554 06/14/17 1404   Level  no documentation  0.5  no documentation  no documentation  no documentation   Minutes  no documentation  15  no  documentation  no documentation  no documentation   METs  no documentation  2.23  no documentation  no documentation  no documentation       NuStep    Row Name 04/19/17 1157 05/03/17 1706 05/17/17 1348 06/02/17 1554 06/14/17 1404   Level  3  3  3  4  4    SPM  80  80  80  80  80   Minutes  10  10  15  15  15    METs  2.8  3  3.7  4.1  4       Home Exercise Plan    Row Name 04/19/17 1157 05/03/17 1706 05/17/17 1348 06/02/17 1554 06/14/17 1404   Plans to continue exercise at  no documentation  no documentation  Home (comment) walking  Home (comment) walking  Home (comment) walking   Frequency  no documentation  no documentation  Add 2 additional days to program exercise sessions.  Add 2 additional days to program exercise sessions.  Add 2 additional days to program exercise sessions.   Initial Home Exercises Provided  no documentation  no documentation  04/26/17  04/26/17  04/26/17          Exercise Comments: Exercise Comments    Row Name 04/19/17 1159 05/18/17 1352 06/14/17 1646   Exercise Comments  Pt was oriented to exercise equipment on 04/19/17. Pt responded well to first exercise session. Pt will progress activity levels as tolerated.  Reviewed METs and goals. Pt is tolerating exercise program very well. Pt symptoms of chest discomfort is also improving.  Pt will continue to exercise safely in cardiac in which we will progess activity as tolerated.   Reviewed METs and goals. Pt is tolerating exercise program very well. Pt symptoms of chest discomfort is also improving. Pt will continue to exercise safely in cardiac in which we will progess activity as tolerated.       Exercise Goals and Review: Exercise Goals    Exercise Goals    Row Name 04/13/17 1448   Increase Physical Activity  Yes   Intervention  Provide advice, education, support and counseling about physical activity/exercise needs.;Develop an individualized exercise prescription for aerobic and resistive training based on initial evaluation findings, risk stratification, comorbidities and participant's personal goals.   Expected Outcomes  Achievement of increased cardiorespiratory fitness and enhanced flexibility, muscular endurance and strength shown through measurements of functional capacity and personal statement of participant.   Increase Strength and Stamina  Yes   Intervention  Provide advice, education, support and counseling about physical activity/exercise needs.;Develop an individualized exercise prescription for aerobic and resistive training based on initial evaluation findings, risk stratification, comorbidities and participant's personal goals.   Expected Outcomes  Achievement of increased cardiorespiratory fitness and enhanced flexibility, muscular endurance and strength shown through measurements of functional capacity and personal statement of participant.   Able to understand and use rate of perceived exertion (RPE) scale  Yes   Intervention  Provide education and explanation on how to use RPE scale   Expected Outcomes  Short Term: Able to use RPE daily in rehab to express subjective intensity level;Long Term:  Able to use RPE to guide intensity level when exercising independently   Knowledge and understanding of Target Heart Rate Range (THRR)  Yes   Intervention  Provide  education and explanation of THRR including how the numbers were predicted and where they are located for reference   Expected Outcomes  Short Term: Able to use daily  as guideline for intensity in rehab;Short Term: Able to state/look up THRR;Long Term: Able to use THRR to govern intensity when exercising independently   Able to check pulse independently  Yes   Intervention  Provide education and demonstration on how to check pulse in carotid and radial arteries.;Review the importance of being able to check your own pulse for safety during independent exercise   Expected Outcomes  Short Term: Able to explain why pulse checking is important during independent exercise;Long Term: Able to check pulse independently and accurately   Understanding of Exercise Prescription  Yes   Intervention  Provide education, explanation, and written materials on patient's individual exercise prescription   Expected Outcomes  Short Term: Able to explain program exercise prescription;Long Term: Able to explain home exercise prescription to exercise independently          Exercise Goals Re-Evaluation : Exercise Goals Re-Evaluation    Exercise Goal Re-Evaluation    Row Name 04/21/17 1200 04/26/17 1522 05/18/17 1355 05/19/17 1624 06/14/17 1646   Exercise Goals Review  Understanding of Exercise Prescription;Able to understand and use rate of perceived exertion (RPE) scale;Increase Strength and Stamina  Knowledge and understanding of Target Heart Rate Range (THRR);Able to understand and use rate of perceived exertion (RPE) scale;Increase Physical Activity;Understanding of Exercise Prescription;Increase Strength and Stamina;Able to check pulse independently  Knowledge and understanding of Target Heart Rate Range (THRR);Able to understand and use rate of perceived exertion (RPE) scale;Increase Physical Activity;Understanding of Exercise Prescription;Increase Strength and Stamina;Able to check pulse independently  no  documentation  no documentation   Comments  Pt transitioned to exercise equipment very well and has great understanding of exercise flow and order. Pt also demonstrates proper use of RPE scale and can exercise continously for 30 minutes  Reviewed home exercise with pt today.  Pt plans to walk for exercise, 2x/week in addition to coming to cardiac rehab.  Reviewed THR, pulse, RPE, sign and symptoms, NTG use, and when to call 911 or MD.  Also discussed weather considerations and indoor options.  Pt voiced understanding.  Pt is making great progress in cardiac rehab. Pt has increase intensity on treadmill to 2.8/2. Pt is also walking at home for exercise  Pt is making great progress in cardiac rehab. Pt has increase intensity on treadmill to 2.8/2. Pt is also walking at home for exercise in which she is averaging ~7200 steps per day. Goal is get to 9000-10,000 per day.  Pt is making steady progress with daily steps. Pt reported via fitness tracker a peak total of 12, 000 steps one day. Pt weekly average is 9,000-10,000 steps. Pt has met personal/program goal and discuss maintaining daily averages of 10,000 steps/day. Pt voiced understanding/agreeable to recommendation.   Expected Outcomes  Pt will continue to build on cardiorespiratory fitness and muscular endurance/strength  Pt will continue to build on cardiorespiratory fitness and muscular endurance/strength by being compliant with home exercise program  Pt will continue to build on cardiorespiratory fitness and muscular endurance/strength by being compliant with home exercise program  Pt will continue to build on cardiorespiratory fitness and muscular endurance/strength by being compliant with home exercise program  Pt will continue to build on cardiorespiratory fitness and muscular endurance/strength by being compliant with home exercise program           Discharge Exercise Prescription (Final Exercise Prescription Changes): Exercise Prescription Changes  - 06/14/17 1404    Response to Exercise          Blood Pressure (  Admit)  124/60    Blood Pressure (Exercise)  166/70    Blood Pressure (Exit)  104/60    Heart Rate (Admit)  70 bpm    Heart Rate (Exercise)  119 bpm    Heart Rate (Exit)  61 bpm    Rating of Perceived Exertion (Exercise)  11    Symptoms  none    Duration  Continue with 30 min of aerobic exercise without signs/symptoms of physical distress.    Intensity  THRR unchanged        Progression          Progression  Continue to progress workloads to maintain intensity without signs/symptoms of physical distress.    Average METs  4.2        Resistance Training          Training Prescription  Yes    Weight  3lbs    Reps  10-15    Time  10 Minutes        Treadmill          MPH  3.2    Grade  2    Minutes  15    METs  4.33        NuStep          Level  4    SPM  80    Minutes  15    METs  4        Home Exercise Plan          Plans to continue exercise at  Home (comment) walking    Frequency  Add 2 additional days to program exercise sessions.    Initial Home Exercises Provided  04/26/17           Nutrition:  Target Goals: Understanding of nutrition guidelines, daily intake of sodium 1500mg , cholesterol 200mg , calories 30% from fat and 7% or less from saturated fats, daily to have 5 or more servings of fruits and vegetables.  Biometrics: Pre Biometrics - 04/13/17 1449    Pre Biometrics          Height  5\' 2"  (1.575 m)    Weight  172 lb 2.9 oz (78.1 kg)    Waist Circumference  36.25 inches    Hip Circumference  44 inches    Waist to Hip Ratio  0.82 %    BMI (Calculated)  31.48    Triceps Skinfold  30 mm    % Body Fat  42.1 %    Grip Strength  35 kg    Flexibility  11 in    Single Leg Stand  22 seconds            Nutrition Therapy Plan and Nutrition Goals: Nutrition Therapy & Goals - 04/14/17 1150    Nutrition Therapy          Diet  Heart Healthy        Personal Nutrition Goals           Nutrition Goal  Pt to identify food quantities necessary to achieve weight loss of 6-15 lb (2.7-10.9 kg) at graduation from cardiac rehab.    Personal Goal #2  Pt to identify and limit food sources of saturated fat, trans fat, and sodium        Intervention Plan          Intervention  Prescribe, educate and counsel regarding individualized specific dietary modifications aiming towards targeted core components such as weight, hypertension, lipid management, diabetes, heart failure and  other comorbidities.    Expected Outcomes  Short Term Goal: Understand basic principles of dietary content, such as calories, fat, sodium, cholesterol and nutrients.;Long Term Goal: Adherence to prescribed nutrition plan.           Nutrition Assessments: Nutrition Assessments - 04/14/17 1150    MEDFICTS Scores          Pre Score  51           Nutrition Goals Re-Evaluation:   Nutrition Goals Re-Evaluation:   Nutrition Goals Discharge (Final Nutrition Goals Re-Evaluation):   Psychosocial: Target Goals: Acknowledge presence or absence of significant depression and/or stress, maximize coping skills, provide positive support system. Participant is able to verbalize types and ability to use techniques and skills needed for reducing stress and depression.  Initial Review & Psychosocial Screening: Initial Psych Review & Screening - 04/13/17 1410    Initial Review          Current issues with  None Identified        Family Dynamics          Good Support System?  Yes Husband, family, friends         Barriers          Psychosocial barriers to participate in program  There are no identifiable barriers or psychosocial needs.        Screening Interventions          Interventions  Encouraged to exercise           Quality of Life Scores: Quality of Life - 04/13/17 1414    Quality of Life Scores          Health/Function Pre  21.13 %    Socioeconomic Pre  24.94 %     Psych/Spiritual Pre  26.57 %    Family Pre  28.8 %    GLOBAL Pre  24.19 %          Scores of 19 and below usually indicate a poorer quality of life in these areas.  A difference of  2-3 points is a clinically meaningful difference.  A difference of 2-3 points in the total score of the Quality of Life Index has been associated with significant improvement in overall quality of life, self-image, physical symptoms, and general health in studies assessing change in quality of life.  PHQ-9: Recent Review Flowsheet Data    Depression screen [facility] 2/9 04/19/2017 01/08/2017 09/04/2016 03/18/2016 02/19/2016   Decreased Interest 0 2 0 0 0   Down, Depressed, Hopeless 1 1 0 0 0   PHQ - 2 Score 1 3 0 0 0   Altered sleeping - 3 - - -   Tired, decreased energy - 2 - - -   Change in appetite - 3 - - -   Feeling bad or failure about yourself  - 0 - - -   Trouble concentrating - 2 - - -   Moving slowly or fidgety/restless - 2 - - -   Suicidal thoughts - 0 - - -   PHQ-9 Score - 15 - - -     Interpretation of Total Score  Total Score Depression Severity:  1-4 = Minimal depression, 5-9 = Mild depression, 10-14 = Moderate depression, 15-19 = Moderately severe depression, 20-27 = Severe depression   Psychosocial Evaluation and Intervention: Psychosocial Evaluation - 04/19/17 1419    Psychosocial Evaluation & Interventions          Interventions  Encouraged to exercise with the program  and follow exercise prescription;Stress management education;Relaxation education    Comments  pt with known history of depression and health related anxiety demonstrates improved psychosocial barriers. pt symptoms and fear may somewhat limit participation.     Expected Outcomes  pt will exhibit improved outlook with good coping skills. pt will exhibit decreased health related anxiety and increased self confidence in self care/disease mangement.      Continue Psychosocial Services   No Follow up required            Psychosocial Re-Evaluation: Psychosocial Re-Evaluation    Psychosocial Re-Evaluation    Row Name 05/20/17 1019 06/17/17 1117   Current issues with  Current Anxiety/Panic;Current Stress Concerns  Current Anxiety/Panic;Current Stress Concerns   Comments  pt with health related stress and anxiety.  Pt demonstrates some improvement in these symptoms with less anginal episodes. pt tolerating CR activity without difficulty.   pt with health related stress and anxiety.  Pt demonstrates some improvement in these symptoms with less anginal episodes. pt tolerating CR activity without difficulty. pt demonstrates increased coping skills with adaptation and self care behaviors.     Expected Outcomes  pt will exhibit improved outlook and coping skills.   pt will exhibit improved outlook and coping skills.    Interventions  Stress management education;Encouraged to attend Cardiac Rehabilitation for the exercise;Relaxation education  Stress management education;Encouraged to attend Cardiac Rehabilitation for the exercise;Relaxation education   Continue Psychosocial Services   Follow up required by staff  Follow up required by staff   Comments  health related stress anxiety  health related stress anxiety       Initial Review    Row Name 05/20/17 1019 06/17/17 1117   Source of Stress Concerns  Poor Coping Skills;Chronic Illness;Unable to perform yard/household activities;Unable to participate in former interests or hobbies  Poor Coping Skills;Chronic Illness;Unable to perform yard/household activities;Unable to participate in former interests or hobbies          Psychosocial Discharge (Final Psychosocial Re-Evaluation): Psychosocial Re-Evaluation - 06/17/17 1117    Psychosocial Re-Evaluation          Current issues with  Current Anxiety/Panic;Current Stress Concerns    Comments  pt with health related stress and anxiety.  Pt demonstrates some improvement in these symptoms with less anginal episodes. pt  tolerating CR activity without difficulty. pt demonstrates increased coping skills with adaptation and self care behaviors.      Expected Outcomes  pt will exhibit improved outlook and coping skills.     Interventions  Stress management education;Encouraged to attend Cardiac Rehabilitation for the exercise;Relaxation education    Continue Psychosocial Services   Follow up required by staff    Comments  health related stress anxiety        Initial Review          Source of Stress Concerns  Poor Coping Skills;Chronic Illness;Unable to perform yard/household activities;Unable to participate in former interests or hobbies           Vocational Rehabilitation: Provide vocational rehab assistance to qualifying candidates.   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 Participant will state understanding/return demonstration of topics presented.  Learning Barriers/Preferences: Learning  Barriers/Preferences - 04/13/17 1409    Learning Barriers/Preferences          Learning Barriers  Sight    Learning Preferences  Written Material;Verbal Instruction           Education Topics: Count Your Pulse:  -Group instruction provided by verbal instruction, demonstration, patient participation and written materials to support subject.  Instructors address importance of being able to find your pulse and how to count your pulse when at home without a heart monitor.  Patients get hands on experience counting their pulse with staff help and individually. Flowsheet Row CARDIAC REHAB PHASE II EXERCISE from 06/11/2017 in [facility] CARDIAC REHAB  Date  04/23/17  Educator  RN      Heart Attack, Angina, and  Risk Factor Modification:  -Group instruction provided by verbal instruction, video, and written materials to support subject.  Instructors address signs and symptoms of angina and heart attacks.    Also discuss risk factors for heart disease and how to make changes to improve heart health risk factors. Flowsheet Row CARDIAC REHAB PHASE II EXERCISE from 06/11/2017 in [facility] CARDIAC REHAB  Date  05/26/17  Instruction Review Code  2- Demonstrated Understanding      Functional Fitness:  -Group instruction provided by verbal instruction, demonstration, patient participation, and written materials to support subject.  Instructors address safety measures for doing things around the house.  Discuss how to get up and down off the floor, how to pick things up properly, how to safely get out of a chair without assistance, and balance training. Flowsheet Row CARDIAC REHAB PHASE II EXERCISE from 06/11/2017 in [facility] CARDIAC REHAB  Date  06/11/17  Instruction Review Code  2- Demonstrated Understanding      Meditation and Mindfulness:  -Group instruction provided by verbal instruction, patient participation, and written materials to support subject.  Instructor addresses importance of mindfulness and meditation practice to help reduce stress and improve awareness.  Instructor also leads participants through a meditation exercise.  Flowsheet Row CARDIAC REHAB PHASE II EXERCISE from 06/16/2017 in [facility] CARDIAC REHAB  Date  06/16/17  Educator  Theda Belfast  Instruction Review Code  2- Demonstrated Understanding      Stretching for Flexibility and Mobility:  -Group instruction provided by verbal instruction, patient participation, and written materials to support subject.  Instructors lead participants through series of stretches that are designed to increase flexibility thus improving mobility.  These stretches are additional exercise for major  muscle groups that are typically performed during regular warm up and cool down.   Hands Only CPR:  -Group verbal, video, and participation provides a basic overview of AHA guidelines for community CPR. Role-play of emergencies allow participants the opportunity to practice calling for help and chest compression technique with discussion of AED use.   Hypertension: -Group verbal and written instruction that provides a basic overview of hypertension including the most recent diagnostic guidelines, risk factor reduction with self-care instructions and medication management.    Nutrition I class: Heart Healthy Eating:  -Group instruction provided by PowerPoint slides, verbal discussion, and written materials to support subject matter. The instructor gives an explanation and review of the Therapeutic Lifestyle Changes diet recommendations, which includes a discussion on lipid goals, dietary fat, sodium, fiber, plant stanol/sterol esters, sugar, and the components of a well-balanced, healthy diet.   Nutrition II class: Lifestyle Skills:  -Group instruction provided by PowerPoint slides, verbal discussion, and  written materials to support subject matter. The instructor gives an explanation and review of label reading, grocery shopping for heart health, heart healthy recipe modifications, and ways to make healthier choices when eating out.   Diabetes Question & Answer:  -Group instruction provided by PowerPoint slides, verbal discussion, and written materials to support subject matter. The instructor gives an explanation and review of diabetes co-morbidities, pre- and post-prandial blood glucose goals, pre-exercise blood glucose goals, signs, symptoms, and treatment of hypoglycemia and hyperglycemia, and foot care basics.   Diabetes Blitz:  -Group instruction provided by PowerPoint slides, verbal discussion, and written materials to support subject matter. The instructor gives an explanation and  review of the physiology behind type 1 and type 2 diabetes, diabetes medications and rational behind using different medications, pre- and post-prandial blood glucose recommendations and Hemoglobin A1c goals, diabetes diet, and exercise including blood glucose guidelines for exercising safely.    Portion Distortion:  -Group instruction provided by PowerPoint slides, verbal discussion, written materials, and food models to support subject matter. The instructor gives an explanation of serving size versus portion size, changes in portions sizes over the last 20 years, and what consists of a serving from each food group. Flowsheet Row CARDIAC REHAB PHASE II EXERCISE from 06/11/2017 in [facility] CARDIAC REHAB  Date  04/28/17  Educator  RD  Instruction Review Code  2- Demonstrated Understanding      Stress Management:  -Group instruction provided by verbal instruction, video, and written materials to support subject matter.  Instructors review role of stress in heart disease and how to cope with stress positively.     Exercising on Your Own:  -Group instruction provided by verbal instruction, power point, and written materials to support subject.  Instructors discuss benefits of exercise, components of exercise, frequency and intensity of exercise, and end points for exercise.  Also discuss use of nitroglycerin and activating EMS.  Review options of places to exercise outside of rehab.  Review guidelines for sex with heart disease. Flowsheet Row CARDIAC REHAB PHASE II EXERCISE from 06/11/2017 in [facility] CARDIAC REHAB  Date  06/02/17  Instruction Review Code  2- Demonstrated Understanding      Cardiac Drugs I:  -Group instruction provided by verbal instruction and written materials to support subject.  Instructor reviews cardiac drug classes: antiplatelets, anticoagulants, beta blockers, and statins.  Instructor discusses reasons, side effects, and lifestyle  considerations for each drug class. Flowsheet Row CARDIAC REHAB PHASE II EXERCISE from 06/11/2017 in [facility] CARDIAC REHAB  Date  06/09/17  Educator  -- [pharmacy]  Instruction Review Code  2- Demonstrated Understanding      Cardiac Drugs II:  -Group instruction provided by verbal instruction and written materials to support subject.  Instructor reviews cardiac drug classes: angiotensin converting enzyme inhibitors (ACE-I), angiotensin II receptor blockers (ARBs), nitrates, and calcium channel blockers.  Instructor discusses reasons, side effects, and lifestyle considerations for each drug class. Flowsheet Row CARDIAC REHAB PHASE II EXERCISE from 06/11/2017 in [facility] CARDIAC REHAB  Date  05/12/17  Instruction Review Code  2- Demonstrated Understanding      Anatomy and Physiology of the Circulatory System:  Group verbal and written instruction and models provide basic cardiac anatomy and physiology, with the coronary electrical and arterial systems. Review of: AMI, Angina, Valve disease, Heart Failure, Peripheral Artery Disease, Cardiac Arrhythmia, Pacemakers, and the ICD.   Other Education:  -Group or individual verbal, written, or video  instructions that support the educational goals of the cardiac rehab program.   Holiday Eating Survival Tips:  -Group instruction provided by PowerPoint slides, verbal discussion, and written materials to support subject matter. The instructor gives patients tips, tricks, and techniques to help them not only survive but enjoy the holidays despite the onslaught of food that accompanies the holidays.   Knowledge Questionnaire Score: Knowledge Questionnaire Score - 04/13/17 1408    Knowledge Questionnaire Score          Pre Score  20/24           Core Components/Risk Factors/Patient Goals at Admission: Personal Goals and Risk Factors at Admission - 04/13/17 1449    Core Components/Risk Factors/Patient  Goals on Admission           Weight Management  Yes;Obesity;Weight Maintenance;Weight Loss    Intervention  Weight Management: Develop a combined nutrition and exercise program designed to reach desired caloric intake, while maintaining appropriate intake of nutrient and fiber, sodium and fats, and appropriate energy expenditure required for the weight goal.;Weight Management: Provide education and appropriate resources to help participant work on and attain dietary goals.;Weight Management/Obesity: Establish reasonable short term and long term weight goals.;Obesity: Provide education and appropriate resources to help participant work on and attain dietary goals.    Admit Weight  172 lb 2.9 oz (78.1 kg)    Goal Weight: Short Term  165 lb (74.8 kg)    Goal Weight: Long Term  150 lb (68 kg)    Expected Outcomes  Short Term: Continue to assess and modify interventions until short term weight is achieved;Long Term: Adherence to nutrition and physical activity/exercise program aimed toward attainment of established weight goal;Weight Maintenance: Understanding of the daily nutrition guidelines, which includes 25-35% calories from fat, 7% or less cal from saturated fats, less than 200mg  cholesterol, less than 1.5gm of sodium, & 5 or more servings of fruits and vegetables daily;Weight Loss: Understanding of general recommendations for a balanced deficit meal plan, which promotes 1-2 lb weight loss per week and includes a negative energy balance of [phone] kcal/d;Understanding recommendations for meals to include 15-35% energy as protein, 25-35% energy from fat, 35-60% energy from carbohydrates, less than 200mg  of dietary cholesterol, 20-35 gm of total fiber daily;Understanding of distribution of calorie intake throughout the day with the consumption of 4-5 meals/snacks    Improve shortness of breath with ADL's  Yes    Intervention  Provide education, individualized exercise plan and daily activity instruction to  help decrease symptoms of SOB with activities of daily living.    Expected Outcomes  Short Term: Achieves a reduction of symptoms when performing activities of daily living.    Hypertension  Yes    Intervention  Provide education on lifestyle modifcations including regular physical activity/exercise, weight management, moderate sodium restriction and increased consumption of fresh fruit, vegetables, and low fat dairy, alcohol moderation, and smoking cessation.;Monitor prescription use compliance.    Expected Outcomes  Short Term: Continued assessment and intervention until BP is < 140/29mm HG in hypertensive participants. < 130/57mm HG in hypertensive participants with diabetes, heart failure or chronic kidney disease.;Long Term: Maintenance of blood pressure at goal levels.    Lipids  Yes    Intervention  Provide education and support for participant on nutrition & aerobic/resistive exercise along with prescribed medications to achieve LDL 70mg , HDL >40mg .    Expected Outcomes  Short Term: Participant states understanding of desired cholesterol values and is compliant with medications prescribed. Participant is  following exercise prescription and nutrition guidelines.;Long Term: Cholesterol controlled with medications as prescribed, with individualized exercise RX and with personalized nutrition plan. Value goals: LDL < 70mg , HDL > 40 mg.           Core Components/Risk Factors/Patient Goals Review:  Goals and Risk Factor Review    Core Components/Risk Factors/Patient Goals Review    Row Name 04/19/17 1417 05/20/17 1017 06/17/17 1116   Personal Goals Review  Weight Management/Obesity;Lipids;Hypertension  Weight Management/Obesity;Lipids;Hypertension  Weight Management/Obesity;Lipids;Hypertension   Review  pt with multiple CAD RF demonstrates eagerness to participate in CR activities. pt personal goals are to stablize angina and medication adjustments, feel comfortable, decrease fear/health related  anxiety by increasing self care knowledge.    pt with multiple CAD RF demonstrates eagerness to participate in CR activities. pt health related anxiety improving as symptoms decrease and confidence increases.  pt demonstrates compliance with medication and lifestyle managment.     pt with multiple CAD RF demonstrates eagerness to participate in CR activities. pt health related anxiety improving as symptoms decrease and confidence increases pt demonstrates increased strength/stamina and appropriate physical activity limititations.   pt demonstrates compliance with medication and lifestyle managment.      Expected Outcomes  pt will participate in CR exercise, nutrition and lifestyle modification opportunities to decrease overall RF.   pt will participate in CR exercise, nutrition and lifestyle modification opportunities to decrease overall RF.   pt will participate in CR exercise, nutrition and lifestyle modification opportunities to decrease overall RF.           Core Components/Risk Factors/Patient Goals at Discharge (Final Review):  Goals and Risk Factor Review - 06/17/17 1116    Core Components/Risk Factors/Patient Goals Review          Personal Goals Review  Weight Management/Obesity;Lipids;Hypertension    Review  pt with multiple CAD RF demonstrates eagerness to participate in CR activities. pt health related anxiety improving as symptoms decrease and confidence increases pt demonstrates increased strength/stamina and appropriate physical activity limititations.   pt demonstrates compliance with medication and lifestyle managment.       Expected Outcomes  pt will participate in CR exercise, nutrition and lifestyle modification opportunities to decrease overall RF.            ITP Comments: ITP Comments    Row Name 04/13/17 1408 04/19/17 1415 05/20/17 1015 06/17/17 1115   ITP Comments  Dr. Armanda Magic, Medical Director   pt started group exercise class today.  pt tolerated light activity  without difficulty.   30 day ITP review. pt with health related anxiety demonstrates willingness to participate in CR opportunities with good attendance and participation.   30 day ITP review. pt with health related anxiety demonstrates willingness to participate in CR opportunities with good attendance and participation.       Comments:

## 2017-06-18 ENCOUNTER — Encounter (HOSPITAL_COMMUNITY)
Admission: RE | Admit: 2017-06-18 | Discharge: 2017-06-18 | Disposition: A | Discharge status: Home / Self Care | Payer: Medicare Other | Source: Ambulatory Visit | Attending: Cardiology | Admitting: Cardiology

## 2017-06-18 DIAGNOSIS — Z955 Presence of coronary angioplasty implant and graft: Secondary | ICD-10-CM

## 2017-06-21 ENCOUNTER — Encounter (HOSPITAL_COMMUNITY)
Admission: RE | Admit: 2017-06-21 | Discharge: 2017-06-21 | Disposition: A | Discharge status: Home / Self Care | Payer: Medicare Other | Source: Ambulatory Visit | Attending: Cardiology | Admitting: Cardiology

## 2017-06-21 DIAGNOSIS — Z955 Presence of coronary angioplasty implant and graft: Secondary | ICD-10-CM | POA: Diagnosis not present

## 2017-06-22 ENCOUNTER — Other Ambulatory Visit: Payer: Self-pay

## 2017-06-22 NOTE — Telephone Encounter (Signed)
Patient left message on nurse line requesting refill of Flexeril. Having a lot of trouble sleeping. Danley Danker, RN [facility] RN)

## 2017-06-23 ENCOUNTER — Encounter (HOSPITAL_COMMUNITY)
Admission: RE | Admit: 2017-06-23 | Discharge: 2017-06-23 | Disposition: A | Discharge status: Home / Self Care | Payer: Medicare Other | Source: Ambulatory Visit | Attending: Cardiovascular Disease | Admitting: Cardiovascular Disease

## 2017-06-23 DIAGNOSIS — Z955 Presence of coronary angioplasty implant and graft: Secondary | ICD-10-CM | POA: Diagnosis not present

## 2017-06-23 NOTE — Telephone Encounter (Signed)
I have not prescribed this medication to her. Cannot refill at this time. If she is having difficulty sleeping please schedule an appointment so we can discuss further. Thank you.   Smitty Cords, MD [place], PGY-3

## 2017-06-24 NOTE — Telephone Encounter (Signed)
Appt scheduled for 07/09/17 [name] [name], [name] [name], [name] [name]

## 2017-06-25 ENCOUNTER — Encounter (HOSPITAL_COMMUNITY)
Admission: RE | Admit: 2017-06-25 | Discharge: 2017-06-25 | Disposition: A | Discharge status: Home / Self Care | Payer: Medicare Other | Source: Ambulatory Visit | Attending: Cardiology | Admitting: Cardiology

## 2017-06-25 DIAGNOSIS — Z955 Presence of coronary angioplasty implant and graft: Secondary | ICD-10-CM

## 2017-06-27 ENCOUNTER — Other Ambulatory Visit: Payer: Self-pay | Admitting: Cardiology

## 2017-06-28 ENCOUNTER — Encounter (HOSPITAL_COMMUNITY)
Admission: RE | Admit: 2017-06-28 | Discharge: 2017-06-28 | Disposition: A | Discharge status: Home / Self Care | Payer: Medicare Other | Source: Ambulatory Visit | Attending: Cardiology | Admitting: Cardiology

## 2017-06-28 DIAGNOSIS — Z955 Presence of coronary angioplasty implant and graft: Secondary | ICD-10-CM | POA: Diagnosis present

## 2017-06-28 NOTE — Telephone Encounter (Signed)
Rx(s) sent to pharmacy electronically.  

## 2017-06-30 ENCOUNTER — Encounter (HOSPITAL_COMMUNITY)
Admission: RE | Admit: 2017-06-30 | Discharge: 2017-06-30 | Disposition: A | Discharge status: Home / Self Care | Payer: Medicare Other | Source: Ambulatory Visit | Attending: Cardiology | Admitting: Cardiology

## 2017-06-30 DIAGNOSIS — Z955 Presence of coronary angioplasty implant and graft: Secondary | ICD-10-CM

## 2017-07-02 ENCOUNTER — Encounter (HOSPITAL_COMMUNITY)
Admission: RE | Admit: 2017-07-02 | Discharge: 2017-07-02 | Disposition: A | Discharge status: Home / Self Care | Payer: Medicare Other | Source: Ambulatory Visit | Attending: Cardiology | Admitting: Cardiology

## 2017-07-02 DIAGNOSIS — Z955 Presence of coronary angioplasty implant and graft: Secondary | ICD-10-CM | POA: Diagnosis not present

## 2017-07-05 ENCOUNTER — Encounter (HOSPITAL_COMMUNITY): Payer: Medicare Other

## 2017-07-07 ENCOUNTER — Encounter (HOSPITAL_COMMUNITY)
Admission: RE | Admit: 2017-07-07 | Discharge: 2017-07-07 | Disposition: A | Discharge status: Home / Self Care | Payer: Medicare Other | Source: Ambulatory Visit | Attending: Cardiology | Admitting: Cardiology

## 2017-07-07 VITALS — Ht 62.0 in | Wt 167.5 lb

## 2017-07-07 DIAGNOSIS — Z955 Presence of coronary angioplasty implant and graft: Secondary | ICD-10-CM | POA: Diagnosis not present

## 2017-07-08 NOTE — Progress Notes (Deleted)
Subjective:    Patient ID: [name] [name] , female   DOB: May 14, 1950 , 67 y.o..   MRN: [id number]  HPI  [name] [name] is here for No chief complaint on file.   1. ***  Review of Systems: Per HPI. All other systems reviewed and are negative.  Health Maintenance Due  Topic Date Due  . FOOT EXAM  04/24/1959  . OPHTHALMOLOGY EXAM  04/24/1959  . URINE MICROALBUMIN  04/24/1959  . COLONOSCOPY  04/24/1999  . DEXA SCAN  04/23/2014  . MAMMOGRAM  02/14/2016  . HEMOGLOBIN A1C  03/06/2017    Past Medical History: Patient Active Problem List   Diagnosis Date Noted  . Unstable angina (HCC)   . Feeling of chest tightness 01/08/2017  . Heel pain, bilateral 09/07/2016  . Orthostatic hypotension 09/07/2016  . Right hip pain 08/19/2016  . Lumbar strain 04/27/2016  . Left knee pain 04/27/2016  . Finger pain, right 04/27/2016  . Neck pain 11/23/2015  . Low back pain 07/11/2015  . Right shoulder pain 04/25/2015  . GERD (gastroesophageal reflux disease) 01/28/2015  . Microcytic anemia 01/28/2015  . Near syncope 11/09/2014  . Leg cramps 11/15/2013  . Vitamin D deficiency 11/15/2013  . Prediabetes 03/01/2012  . Chest pain 08/18/2011  . Anxiety and depression 05/31/2009  . Mitral valve disorder 04/15/2009  . CARPAL TUNNEL SYNDROME 01/26/2008  . OBESITY 01/04/2008  . HYPERCHOLESTEROLEMIA 06/24/2006  . Essential hypertension 06/24/2006    Medications: reviewed and updated Current Outpatient Medications  Medication Sig Dispense Refill  . amLODipine (NORVASC) 2.5 MG tablet Take 1 tablet (2.5 mg total) by mouth daily. 30 tablet 6  . aspirin 81 MG tablet Take 81 mg by mouth daily.    [name] atorvastatin (LIPITOR) 40 MG tablet Take 1 tablet (40 mg total) by mouth daily. 30 tablet 5  . clopidogrel (PLAVIX) 75 MG tablet Take 1 tablet (75 mg total) by mouth daily with breakfast. 30 tablet 6  . clopidogrel (PLAVIX) 75 MG tablet TAKE 1 TABLET (75 MG TOTAL) BY MOUTH DAILY WITH BREAKFAST. 90  tablet 2  . cyclobenzaprine (FLEXERIL) 10 MG tablet TAKE 10 MG BY MOUTH AT BEDTIME FOR BACK PAIN AND AS SLEEP AID  1  . ferrous sulfate 325 (65 FE) MG tablet Take 1 tablet (325 mg total) by mouth daily with breakfast. 30 tablet 3  . fluticasone (FLONASE) 50 MCG/ACT nasal spray Place 1 spray daily into both nostrils.    . metoprolol tartrate (LOPRESSOR) 25 MG tablet Take 0.5 tablets (12.5 mg total) by mouth 2 (two) times daily. 180 tablet 4  . nitroGLYCERIN (NITROSTAT) 0.4 MG SL tablet Place 1 tablet (0.4 mg total) under the tongue every 5 (five) minutes as needed for chest pain. 25 tablet 5  . pantoprazole (PROTONIX) 40 MG tablet Take 1 tablet (40 mg total) by mouth 2 (two) times daily. 180 tablet 3  . sertraline (ZOLOFT) 100 MG tablet Take 1 tablet (100 mg total) by mouth daily. 30 tablet 3   No current facility-administered medications for this visit.     Social Hx:  reports that she quit smoking about 12 years ago. She has a 10.00 pack-year smoking history. She has never used smokeless tobacco.   Objective:   There were no vitals taken for this visit. Physical Exam  Gen: NAD, alert, cooperative with exam, well-appearing HEENT: NCAT, PERRL, clear conjunctiva, oropharynx clear, supple neck Cardiac: Regular rate and rhythm, normal S1/S2, no murmur, no edema, capillary refill brisk  Respiratory: Clear to auscultation bilaterally, no wheezes, non-labored breathing Gastrointestinal: soft, non tender, non distended, bowel sounds present Skin: no rashes, normal turgor  Neurological: no gross deficits.  Psych: good insight, normal mood and affect  Assessment & Plan:  No problem-specific Assessment & Plan notes found for this encounter.  No orders of the defined types were placed in this encounter.  No orders of the defined types were placed in this encounter.   Anders Simmonds, MD [facility], PGY-3

## 2017-07-09 ENCOUNTER — Encounter (HOSPITAL_COMMUNITY): Payer: Medicare Other

## 2017-07-09 ENCOUNTER — Ambulatory Visit: Payer: Medicare Other | Admitting: Family Medicine

## 2017-07-12 ENCOUNTER — Encounter (HOSPITAL_COMMUNITY)
Admission: RE | Admit: 2017-07-12 | Discharge: 2017-07-12 | Disposition: A | Discharge status: Home / Self Care | Payer: Medicare Other | Source: Ambulatory Visit | Attending: Cardiology | Admitting: Cardiology

## 2017-07-12 DIAGNOSIS — Z955 Presence of coronary angioplasty implant and graft: Secondary | ICD-10-CM | POA: Diagnosis not present

## 2017-07-14 ENCOUNTER — Encounter (HOSPITAL_COMMUNITY)
Admission: RE | Admit: 2017-07-14 | Discharge: 2017-07-14 | Disposition: A | Discharge status: Home / Self Care | Payer: Medicare Other | Source: Ambulatory Visit | Attending: Cardiology | Admitting: Cardiology

## 2017-07-14 ENCOUNTER — Encounter (HOSPITAL_COMMUNITY): Payer: Self-pay

## 2017-07-14 DIAGNOSIS — Z955 Presence of coronary angioplasty implant and graft: Secondary | ICD-10-CM

## 2017-07-15 NOTE — Progress Notes (Signed)
Cardiac Individual Treatment Plan  Patient Details  Name: RAY GIOVANNONI MRN: [id number] Date of Birth: [birth year] Referring Provider:   Flowsheet Row CARDIAC REHAB PHASE II ORIENTATION from 04/13/2017 in [facility] CARDIAC REHAB  Referring Provider  Olga Millers MD      Initial Encounter Date:  Flowsheet Row CARDIAC REHAB PHASE II ORIENTATION from 04/13/2017 in [facility] CARDIAC REHAB  Date  04/13/17  Referring Provider  Olga Millers MD      Visit Diagnosis: Stented coronary artery 03/01/17 S/P DES Ostial Ramus  Patient's Home Medications on Admission:  Current Outpatient Medications:  .  amLODipine (NORVASC) 2.5 MG tablet, Take 1 tablet (2.5 mg total) by mouth daily., Disp: 30 tablet, Rfl: 6 .  aspirin 81 MG tablet, Take 81 mg by mouth daily., Disp: , Rfl:  .  atorvastatin (LIPITOR) 40 MG tablet, Take 1 tablet (40 mg total) by mouth daily., Disp: 30 tablet, Rfl: 5 .  clopidogrel (PLAVIX) 75 MG tablet, Take 1 tablet (75 mg total) by mouth daily with breakfast., Disp: 30 tablet, Rfl: 6 .  clopidogrel (PLAVIX) 75 MG tablet, TAKE 1 TABLET (75 MG TOTAL) BY MOUTH DAILY WITH BREAKFAST., Disp: 90 tablet, Rfl: 2 .  cyclobenzaprine (FLEXERIL) 10 MG tablet, TAKE 10 MG BY MOUTH AT BEDTIME FOR BACK PAIN AND AS SLEEP AID, Disp: , Rfl: 1 .  ferrous sulfate 325 (65 FE) MG tablet, Take 1 tablet (325 mg total) by mouth daily with breakfast., Disp: 30 tablet, Rfl: 3 .  fluticasone (FLONASE) 50 MCG/ACT nasal spray, Place 1 spray daily into both nostrils., Disp: , Rfl:  .  metoprolol tartrate (LOPRESSOR) 25 MG tablet, Take 0.5 tablets (12.5 mg total) by mouth 2 (two) times daily., Disp: 180 tablet, Rfl: 4 .  nitroGLYCERIN (NITROSTAT) 0.4 MG SL tablet, Place 1 tablet (0.4 mg total) under the tongue every 5 (five) minutes as needed for chest pain., Disp: 25 tablet, Rfl: 5 .  pantoprazole (PROTONIX) 40 MG tablet, Take 1 tablet (40 mg total) by mouth 2 (two) times  daily., Disp: 180 tablet, Rfl: 3 .  sertraline (ZOLOFT) 100 MG tablet, Take 1 tablet (100 mg total) by mouth daily., Disp: 30 tablet, Rfl: 3  Past Medical History: Past Medical History:  Diagnosis Date  . Coronary artery disease   . Depression   . Hyperlipidemia   . Hypertension   . Microcytic anemia   . Mitral valve prolapse     Tobacco Use: Social History   Tobacco Use  Smoking Status Former Smoker  . Packs/day: 0.50  . Years: 20.00  . Pack years: 10.00  . Last attempt to quit: 07/30/2004  . Years since quitting: 12.9  Smokeless Tobacco Never Used    Labs: Recent Hydrographic surveyor    Labs for ITP Cardiac and Pulmonary Rehab Latest Ref Rng & Units 07/11/2015 11/22/2015 09/04/2016 02/11/2017 04/27/2017   Cholestrol 100 - 199 mg/dL - - - 629(B) 284   LDLCALC 0 - 99 mg/dL - - - 132(G) 68   LDLDIRECT mg/dL - - - - -   HDL >40 mg/dL - - - 55 49   Trlycerides 0 - 149 mg/dL - - - 102 725   Hemoglobin A1c - 5.7 5.9 5.8 - -      Capillary Blood Glucose: Lab Results  Component Value Date   GLUCAP 110 (H) 05/03/2017     Exercise Target Goals:    Exercise Program Goal: Individual exercise prescription set using results  from initial 6 min walk test and THRR while considering  patient's activity barriers and safety.   Exercise Prescription Goal: Initial exercise prescription builds to 30-45 minutes a day of aerobic activity, 2-3 days per week.  Home exercise guidelines will be given to patient during program as part of exercise prescription that the participant will acknowledge.  Activity Barriers & Risk Stratification: Activity Barriers & Cardiac Risk Stratification - 04/13/17 1451    Activity Barriers & Cardiac Risk Stratification          Activity Barriers  Muscular Weakness;Deconditioning    Cardiac Risk Stratification  High           6 Minute Walk: 6 Minute Walk    6 Minute Walk    Row Name 04/13/17 1444 04/13/17 1457 07/08/17 1138   Phase  Initial  no  documentation  Discharge   Distance  1659 feet  no documentation  1800 feet   Distance % Change  no documentation  no documentation  8.5 %   Distance Feet Change  no documentation  no documentation  141 ft   Walk Time  6 minutes  no documentation  6 minutes   # of Rest Breaks  0  no documentation  0   MPH  3.14  no documentation  3.4   METS  3.5  no documentation  3.8   RPE  11  no documentation  11   VO2 Peak  12.1  no documentation  13.2   Symptoms  No  no documentation  No   Resting HR  58 bpm  no documentation  60 bpm   Resting BP  114/72  no documentation  138/62   Resting Oxygen Saturation   98 %  no documentation  no documentation   Exercise Oxygen Saturation  during 6 min walk  97 %  no documentation  no documentation   Max Ex. HR  98 bpm  no documentation  109 bpm   Max Ex. BP  142/60  no documentation  136/64   2 Minute Post BP  no documentation  124/64  112/60          Oxygen Initial Assessment:   Oxygen Re-Evaluation:   Oxygen Discharge (Final Oxygen Re-Evaluation):   Initial Exercise Prescription: Initial Exercise Prescription - 04/13/17 1400    Date of Initial Exercise RX and Referring Provider          Date  04/13/17    Referring Provider  Olga Millers MD        Treadmill          MPH  2.5    Grade  1    Minutes  15    METs  3.26        Bike          Level  0.5    Minutes  15    METs  2.18        NuStep          Level  3    SPM  80    Minutes  10    METs  2        Prescription Details          Frequency (times per week)  3    Duration  Progress to 30 minutes of continuous aerobic without signs/symptoms of physical distress        Intensity          THRR 40-80% of Max Heartrate  16-109    Ratings of Perceived Exertion  11-13    Perceived Dyspnea  0-4        Progression          Progression  Continue to progress workloads to maintain intensity without signs/symptoms of physical distress.        Resistance Training           Training Prescription  Yes    Weight  2lbs    Reps  10-15           Perform Capillary Blood Glucose checks as needed.  Exercise Prescription Changes: Exercise Prescription Changes    Response to Exercise    Row Name 04/19/17 1157 05/03/17 1706 05/17/17 1348 06/02/17 1554 06/14/17 1404   Blood Pressure (Admit)  128/80  112/64  120/64  118/60  124/60   Blood Pressure (Exercise)  158/64  140/60  154/60  134/60  166/70   Blood Pressure (Exit)  100/54  104/60  114/62  100/60  104/60   Heart Rate (Admit)  72 bpm  82 bpm  71 bpm  66 bpm  70 bpm   Heart Rate (Exercise)  96 bpm  89 bpm  114 bpm  106 bpm  119 bpm   Heart Rate (Exit)  66 bpm  55 bpm  66 bpm  58 bpm  61 bpm   Rating of Perceived Exertion (Exercise)  11  11  11  12  11    Symptoms  none  none  none  none  none   Comments  pt was oriented to exercise equipment  pt was oriented to exercise equipment  no documentation  no documentation  no documentation   Duration  Continue with 30 min of aerobic exercise without signs/symptoms of physical distress.  Continue with 30 min of aerobic exercise without signs/symptoms of physical distress.  Continue with 30 min of aerobic exercise without signs/symptoms of physical distress.  Continue with 30 min of aerobic exercise without signs/symptoms of physical distress.  Continue with 30 min of aerobic exercise without signs/symptoms of physical distress.   Intensity  THRR unchanged  THRR unchanged  THRR unchanged  THRR unchanged  THRR unchanged       Progression    Row Name 04/19/17 1157 05/03/17 1706 05/17/17 1348 06/02/17 1554 06/14/17 1404   Progression  Continue to progress workloads to maintain intensity without signs/symptoms of physical distress.  Continue to progress workloads to maintain intensity without signs/symptoms of physical distress.  Continue to progress workloads to maintain intensity without signs/symptoms of physical distress.  Continue to progress workloads to maintain  intensity without signs/symptoms of physical distress.  Continue to progress workloads to maintain intensity without signs/symptoms of physical distress.   Average METs  3  2.6  3.8  4.2  4.2       Resistance Training    Row Name 04/19/17 1157 05/03/17 1706 05/17/17 1348 06/02/17 1554 06/14/17 1404   Training Prescription  Yes  Yes  Yes  No relaxation day  Yes   Weight  2lbs  3lbs  3lbs  no documentation  3lbs   Reps  10-15  10-15  10-15  no documentation  10-15   Time  10 Minutes  10 Minutes  10 Minutes  no documentation  10 Minutes       Treadmill    Row Name 04/19/17 1157 05/03/17 1706 05/17/17 1348 06/02/17 1554 06/14/17 1404   MPH  2.5  no documentation  2.8  3.2  3.2   Grade  1  no documentation  2  2  2    Minutes  15  no documentation  15  15  15    METs  3.26  no documentation  3.91  4.33  4.33       Bike    Row Name 04/19/17 1157 05/03/17 1706 05/17/17 1348 06/02/17 1554 06/14/17 1404   Level  no documentation  0.5  no documentation  no documentation  no documentation   Minutes  no documentation  15  no documentation  no documentation  no documentation   METs  no documentation  2.23  no documentation  no documentation  no documentation       NuStep    Row Name 04/19/17 1157 05/03/17 1706 05/17/17 1348 06/02/17 1554 06/14/17 1404   Level  3  3  3  4  4    SPM  80  80  80  80  80   Minutes  10  10  15  15  15    METs  2.8  3  3.7  4.1  4       Home Exercise Plan    Row Name 04/19/17 1157 05/03/17 1706 05/17/17 1348 06/02/17 1554 06/14/17 1404   Plans to continue exercise at  no documentation  no documentation  Home (comment) walking  Home (comment) walking  Home (comment) walking   Frequency  no documentation  no documentation  Add 2 additional days to program exercise sessions.  Add 2 additional days to program exercise sessions.  Add 2 additional days to program exercise sessions.   Initial Home Exercises Provided  no documentation  no documentation  04/26/17  04/26/17   04/26/17       Response to Exercise    Row Name 06/28/17 1207 07/15/17 1100   Blood Pressure (Admit)  132/62  140/68   Blood Pressure (Exercise)  150/70  176/60   Blood Pressure (Exit)  123/58  118/58   Heart Rate (Admit)  74 bpm  71 bpm   Heart Rate (Exercise)  129 bpm  101 bpm   Heart Rate (Exit)  74 bpm  59 bpm   Rating of Perceived Exertion (Exercise)  12  11   Symptoms  none  none   Duration  Continue with 30 min of aerobic exercise without signs/symptoms of physical distress.  Continue with 30 min of aerobic exercise without signs/symptoms of physical distress.   Intensity  THRR unchanged  THRR unchanged       Progression    Row Name 06/28/17 1207 07/15/17 1100   Progression  Continue to progress workloads to maintain intensity without signs/symptoms of physical distress.  Continue to progress workloads to maintain intensity without signs/symptoms of physical distress.   Average METs  4.3  4.3       Resistance Training    Row Name 06/28/17 1207 07/15/17 1100   Training Prescription  Yes  Yes   Weight  4lbs  4lbs   Reps  10-15  10-15   Time  10 Minutes  10 Minutes       Treadmill    Row Name 06/28/17 1207 07/15/17 1100   MPH  no documentation  2.8 dc due to CP at home    Grade  no documentation  2   Minutes  no documentation  15   METs  no documentation  4.3       Bike    Row Name 06/28/17 1207 07/15/17 1100  Level  3 upright scifit  no documentation   Minutes  15  no documentation   METs  4.4  no documentation       NuStep    Row Name 06/28/17 1207 07/15/17 1100   Level  5  5   SPM  80  80   Minutes  15  15   METs  4.2  4.3       Home Exercise Plan    Row Name 06/28/17 1207 07/15/17 1100   Plans to continue exercise at  Home (comment) walking  Home (comment) walking   Frequency  Add 2 additional days to program exercise sessions.  Add 2 additional days to program exercise sessions.   Initial Home Exercises Provided  04/26/17  04/26/17           Exercise Comments: Exercise Comments    Row Name 04/19/17 1159 05/18/17 1352 06/14/17 1646 07/15/17 1113   Exercise Comments  Pt was oriented to exercise equipment on 04/19/17. Pt responded well to first exercise session. Pt will progress activity levels as tolerated.  Reviewed METs and goals. Pt is tolerating exercise program very well. Pt symptoms of chest discomfort is also improving. Pt will continue to exercise safely in cardiac in which we will progess activity as tolerated.   Reviewed METs and goals. Pt is tolerating exercise program very well. Pt symptoms of chest discomfort is also improving. Pt will continue to exercise safely in cardiac in which we will progess activity as tolerated.   Reviewed METs and goals. Pt is tolerating exercise program very well. Pt symptoms of chest discomfort is also improving. Pt will continue to exercise safely in cardiac in which we will progess activity as tolerated.       Exercise Goals and Review: Exercise Goals    Exercise Goals    Row Name 04/13/17 1448   Increase Physical Activity  Yes   Intervention  Provide advice, education, support and counseling about physical activity/exercise needs.;Develop an individualized exercise prescription for aerobic and resistive training based on initial evaluation findings, risk stratification, comorbidities and participant's personal goals.   Expected Outcomes  Achievement of increased cardiorespiratory fitness and enhanced flexibility, muscular endurance and strength shown through measurements of functional capacity and personal statement of participant.   Increase Strength and Stamina  Yes   Intervention  Provide advice, education, support and counseling about physical activity/exercise needs.;Develop an individualized exercise prescription for aerobic and resistive training based on initial evaluation findings, risk stratification, comorbidities and participant's personal goals.   Expected Outcomes  Achievement  of increased cardiorespiratory fitness and enhanced flexibility, muscular endurance and strength shown through measurements of functional capacity and personal statement of participant.   Able to understand and use rate of perceived exertion (RPE) scale  Yes   Intervention  Provide education and explanation on how to use RPE scale   Expected Outcomes  Short Term: Able to use RPE daily in rehab to express subjective intensity level;Long Term:  Able to use RPE to guide intensity level when exercising independently   Knowledge and understanding of Target Heart Rate Range (THRR)  Yes   Intervention  Provide education and explanation of THRR including how the numbers were predicted and where they are located for reference   Expected Outcomes  Short Term: Able to use daily as guideline for intensity in rehab;Short Term: Able to state/look up THRR;Long Term: Able to use THRR to govern intensity when exercising independently   Able to check pulse independently  Yes   Intervention  Provide education and demonstration on how to check pulse in carotid and radial arteries.;Review the importance of being able to check your own pulse for safety during independent exercise   Expected Outcomes  Short Term: Able to explain why pulse checking is important during independent exercise;Long Term: Able to check pulse independently and accurately   Understanding of Exercise Prescription  Yes   Intervention  Provide education, explanation, and written materials on patient's individual exercise prescription   Expected Outcomes  Short Term: Able to explain program exercise prescription;Long Term: Able to explain home exercise prescription to exercise independently          Exercise Goals Re-Evaluation : Exercise Goals Re-Evaluation    Exercise Goal Re-Evaluation    Row Name 04/21/17 1200 04/26/17 1522 05/18/17 1355 05/19/17 1624 06/14/17 1646   Exercise Goals Review  Understanding of Exercise Prescription;Able to  understand and use rate of perceived exertion (RPE) scale;Increase Strength and Stamina  Knowledge and understanding of Target Heart Rate Range (THRR);Able to understand and use rate of perceived exertion (RPE) scale;Increase Physical Activity;Understanding of Exercise Prescription;Increase Strength and Stamina;Able to check pulse independently  Knowledge and understanding of Target Heart Rate Range (THRR);Able to understand and use rate of perceived exertion (RPE) scale;Increase Physical Activity;Understanding of Exercise Prescription;Increase Strength and Stamina;Able to check pulse independently  no documentation  no documentation   Comments  Pt transitioned to exercise equipment very well and has great understanding of exercise flow and order. Pt also demonstrates proper use of RPE scale and can exercise continously for 30 minutes  Reviewed home exercise with pt today.  Pt plans to walk for exercise, 2x/week in addition to coming to cardiac rehab.  Reviewed THR, pulse, RPE, sign and symptoms, NTG use, and when to call 911 or MD.  Also discussed weather considerations and indoor options.  Pt voiced understanding.  Pt is making great progress in cardiac rehab. Pt has increase intensity on treadmill to 2.8/2. Pt is also walking at home for exercise  Pt is making great progress in cardiac rehab. Pt has increase intensity on treadmill to 2.8/2. Pt is also walking at home for exercise in which she is averaging ~7200 steps per day. Goal is get to 9000-10,000 per day.  Pt is making steady progress with daily steps. Pt reported via fitness tracker a peak total of 12, 000 steps one day. Pt weekly average is 9,000-10,000 steps. Pt has met personal/program goal and discuss maintaining daily averages of 10,000 steps/day. Pt voiced understanding/agreeable to recommendation.   Expected Outcomes  Pt will continue to build on cardiorespiratory fitness and muscular endurance/strength  Pt will continue to build on  cardiorespiratory fitness and muscular endurance/strength by being compliant with home exercise program  Pt will continue to build on cardiorespiratory fitness and muscular endurance/strength by being compliant with home exercise program  Pt will continue to build on cardiorespiratory fitness and muscular endurance/strength by being compliant with home exercise program  Pt will continue to build on cardiorespiratory fitness and muscular endurance/strength by being compliant with home exercise program       Exercise Goal Re-Evaluation    Row Name 07/15/17 1111   Exercise Goals Review  Knowledge and understanding of Target Heart Rate Range (THRR);Able to understand and use rate of perceived exertion (RPE) scale;Increase Physical Activity;Understanding of Exercise Prescription;Increase Strength and Stamina;Able to check pulse independently   Comments  pt is making steady progress with weight loss. pt exercising at home 2x/week, walking.  Expected Outcomes  Pt will continue to build on cardiorespiratory fitness and muscular endurance/strength by being compliant with home exercise program           Discharge Exercise Prescription (Final Exercise Prescription Changes): Exercise Prescription Changes - 07/15/17 1100    Response to Exercise          Blood Pressure (Admit)  140/68    Blood Pressure (Exercise)  176/60    Blood Pressure (Exit)  118/58    Heart Rate (Admit)  71 bpm    Heart Rate (Exercise)  101 bpm    Heart Rate (Exit)  59 bpm    Rating of Perceived Exertion (Exercise)  11    Symptoms  none    Duration  Continue with 30 min of aerobic exercise without signs/symptoms of physical distress.    Intensity  THRR unchanged        Progression          Progression  Continue to progress workloads to maintain intensity without signs/symptoms of physical distress.    Average METs  4.3        Resistance Training          Training Prescription  Yes    Weight  4lbs    Reps  10-15     Time  10 Minutes        Treadmill          MPH  2.8 dc due to CP at home     Grade  2    Minutes  15    METs  4.3        NuStep          Level  5    SPM  80    Minutes  15    METs  4.3        Home Exercise Plan          Plans to continue exercise at  Home (comment) walking    Frequency  Add 2 additional days to program exercise sessions.    Initial Home Exercises Provided  04/26/17           Nutrition:  Target Goals: Understanding of nutrition guidelines, daily intake of sodium 1500mg , cholesterol 200mg , calories 30% from fat and 7% or less from saturated fats, daily to have 5 or more servings of fruits and vegetables.  Biometrics: Pre Biometrics - 04/13/17 1449    Pre Biometrics          Height  5\' 2"  (1.575 m)    Weight  172 lb 2.9 oz (78.1 kg)    Waist Circumference  36.25 inches    Hip Circumference  44 inches    Waist to Hip Ratio  0.82 %    BMI (Calculated)  31.48    Triceps Skinfold  30 mm    % Body Fat  42.1 %    Grip Strength  35 kg    Flexibility  11 in    Single Leg Stand  22 seconds          Post Biometrics - 07/08/17 1139     Post  Biometrics          Height  5\' 2"  (1.575 m)    Weight  167 lb 8.8 oz (76 kg)    Waist Circumference  34 inches    Hip Circumference  42.5 inches    Waist to Hip Ratio  0.8 %    BMI (Calculated)  30.64  Triceps Skinfold  27 mm    % Body Fat  40.2 %    Grip Strength  36 kg    Flexibility  12.25 in    Single Leg Stand  25 seconds           Nutrition Therapy Plan and Nutrition Goals: Nutrition Therapy & Goals - 04/14/17 1150    Nutrition Therapy          Diet  Heart Healthy        Personal Nutrition Goals          Nutrition Goal  Pt to identify food quantities necessary to achieve weight loss of 6-15 lb (2.7-10.9 kg) at graduation from cardiac rehab.    Personal Goal #2  Pt to identify and limit food sources of saturated fat, trans fat, and sodium        Intervention Plan           Intervention  Prescribe, educate and counsel regarding individualized specific dietary modifications aiming towards targeted core components such as weight, hypertension, lipid management, diabetes, heart failure and other comorbidities.    Expected Outcomes  Short Term Goal: Understand basic principles of dietary content, such as calories, fat, sodium, cholesterol and nutrients.;Long Term Goal: Adherence to prescribed nutrition plan.           Nutrition Assessments: Nutrition Assessments - 04/14/17 1150    MEDFICTS Scores          Pre Score  51           Nutrition Goals Re-Evaluation:   Nutrition Goals Re-Evaluation:   Nutrition Goals Discharge (Final Nutrition Goals Re-Evaluation):   Psychosocial: Target Goals: Acknowledge presence or absence of significant depression and/or stress, maximize coping skills, provide positive support system. Participant is able to verbalize types and ability to use techniques and skills needed for reducing stress and depression.  Initial Review & Psychosocial Screening: Initial Psych Review & Screening - 04/13/17 1410    Initial Review          Current issues with  None Identified        Family Dynamics          Good Support System?  Yes Husband, family, friends         Barriers          Psychosocial barriers to participate in program  There are no identifiable barriers or psychosocial needs.        Screening Interventions          Interventions  Encouraged to exercise           Quality of Life Scores: Quality of Life - 07/12/17 1515    Quality of Life Scores          Health/Function Pre  21.13 %    Health/Function Post  25.03 %    Health/Function % Change  18.46 %    Socioeconomic Pre  24.94 %    Socioeconomic Post  27 %    Socioeconomic % Change   8.26 %    Psych/Spiritual Pre  26.57 %    Psych/Spiritual Post  29.14 %    Psych/Spiritual % Change  9.67 %    Family Pre  28.8 %    Family Post  27.6 %    Family %  Change  -4.17 %    GLOBAL Pre  24.19 %    GLOBAL Post  26.67 %    GLOBAL % Change  10.25 %  Scores of 19 and below usually indicate a poorer quality of life in these areas.  A difference of  2-3 points is a clinically meaningful difference.  A difference of 2-3 points in the total score of the Quality of Life Index has been associated with significant improvement in overall quality of life, self-image, physical symptoms, and general health in studies assessing change in quality of life.  PHQ-9: Recent Review Flowsheet Data    Depression screen [facility] 2/9 04/19/2017 01/08/2017 09/04/2016 03/18/2016 02/19/2016   Decreased Interest 0 2 0 0 0   Down, Depressed, Hopeless 1 1 0 0 0   PHQ - 2 Score 1 3 0 0 0   Altered sleeping - 3 - - -   Tired, decreased energy - 2 - - -   Change in appetite - 3 - - -   Feeling bad or failure about yourself  - 0 - - -   Trouble concentrating - 2 - - -   Moving slowly or fidgety/restless - 2 - - -   Suicidal thoughts - 0 - - -   PHQ-9 Score - 15 - - -     Interpretation of Total Score  Total Score Depression Severity:  1-4 = Minimal depression, 5-9 = Mild depression, 10-14 = Moderate depression, 15-19 = Moderately severe depression, 20-27 = Severe depression   Psychosocial Evaluation and Intervention: Psychosocial Evaluation - 04/19/17 1419    Psychosocial Evaluation & Interventions          Interventions  Encouraged to exercise with the program and follow exercise prescription;Stress management education;Relaxation education    Comments  pt with known history of depression and health related anxiety demonstrates improved psychosocial barriers. pt symptoms and fear may somewhat limit participation.     Expected Outcomes  pt will exhibit improved outlook with good coping skills. pt will exhibit decreased health related anxiety and increased self confidence in self care/disease mangement.      Continue Psychosocial Services   No Follow up required            Psychosocial Re-Evaluation: Psychosocial Re-Evaluation    Psychosocial Re-Evaluation    Row Name 05/20/17 1019 06/17/17 1117 07/14/17 1657   Current issues with  Current Anxiety/Panic;Current Stress Concerns  Current Anxiety/Panic;Current Stress Concerns  Current Anxiety/Panic;Current Stress Concerns   Comments  pt with health related stress and anxiety.  Pt demonstrates some improvement in these symptoms with less anginal episodes. pt tolerating CR activity without difficulty.   pt with health related stress and anxiety.  Pt demonstrates some improvement in these symptoms with less anginal episodes. pt tolerating CR activity without difficulty. pt demonstrates increased coping skills with adaptation and self care behaviors.    pt with health related stress and anxiety.  Pt demonstrates some improvement in these symptoms with less anginal episodes. pt tolerating CR activity without difficulty. pt demonstrates increased coping skills with adaptation and self care behaviors.     Expected Outcomes  pt will exhibit improved outlook and coping skills.   pt will exhibit improved outlook and coping skills.   pt will exhibit improved outlook and coping skills.    Interventions  Stress management education;Encouraged to attend Cardiac Rehabilitation for the exercise;Relaxation education  Stress management education;Encouraged to attend Cardiac Rehabilitation for the exercise;Relaxation education  Stress management education;Encouraged to attend Cardiac Rehabilitation for the exercise;Relaxation education   Continue Psychosocial Services   Follow up required by staff  Follow up required by staff  Follow up required  by staff   Comments  health related stress anxiety  health related stress anxiety  health related stress anxiety       Initial Review    Row Name 05/20/17 1019 06/17/17 1117 07/14/17 1657   Source of Stress Concerns  Poor Coping Skills;Chronic Illness;Unable to perform yard/household  activities;Unable to participate in former interests or hobbies  Poor Coping Skills;Chronic Illness;Unable to perform yard/household activities;Unable to participate in former interests or hobbies  Poor Coping Skills;Chronic Illness;Unable to perform yard/household activities;Unable to participate in former interests or hobbies          Psychosocial Discharge (Final Psychosocial Re-Evaluation): Psychosocial Re-Evaluation - 07/14/17 1657    Psychosocial Re-Evaluation          Current issues with  Current Anxiety/Panic;Current Stress Concerns    Comments  pt with health related stress and anxiety.  Pt demonstrates some improvement in these symptoms with less anginal episodes. pt tolerating CR activity without difficulty. pt demonstrates increased coping skills with adaptation and self care behaviors.      Expected Outcomes  pt will exhibit improved outlook and coping skills.     Interventions  Stress management education;Encouraged to attend Cardiac Rehabilitation for the exercise;Relaxation education    Continue Psychosocial Services   Follow up required by staff    Comments  health related stress anxiety        Initial Review          Source of Stress Concerns  Poor Coping Skills;Chronic Illness;Unable to perform yard/household activities;Unable to participate in former interests or hobbies           Vocational Rehabilitation: Provide vocational rehab assistance to qualifying candidates.   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 Participant will state understanding/return demonstration of topics presented.  Learning Barriers/Preferences: Learning Barriers/Preferences - 04/13/17 1409    Learning  Barriers/Preferences          Learning Barriers  Sight    Learning Preferences  Written Material;Verbal Instruction           Education Topics: Count Your Pulse:  -Group instruction provided by verbal instruction, demonstration, patient participation and written materials to support subject.  Instructors address importance of being able to find your pulse and how to count your pulse when at home without a heart monitor.  Patients get hands on experience counting their pulse with staff help and individually. Flowsheet Row CARDIAC REHAB PHASE II EXERCISE from 06/11/2017 in [facility] CARDIAC REHAB  Date  04/23/17  Educator  RN      Heart Attack, Angina, and Risk Factor Modification:  -Group instruction provided by verbal instruction, video, and written materials to support subject.  Instructors address signs and symptoms of angina and heart attacks.    Also discuss risk factors for heart disease and how to make changes to improve heart health risk factors. Flowsheet Row CARDIAC REHAB PHASE II EXERCISE from 06/11/2017 in [facility] CARDIAC REHAB  Date  05/26/17  Instruction Review Code  2- Demonstrated Understanding      Functional Fitness:  -Group instruction provided by verbal instruction, demonstration, patient participation, and written materials to support subject.  Instructors  address safety measures for doing things around the house.  Discuss how to get up and down off the floor, how to pick things up properly, how to safely get out of a chair without assistance, and balance training. Flowsheet Row CARDIAC REHAB PHASE II EXERCISE from 06/11/2017 in [facility] CARDIAC REHAB  Date  06/11/17  Instruction Review Code  2- Demonstrated Understanding      Meditation and Mindfulness:  -Group instruction provided by verbal instruction, patient participation, and written materials to support subject.  Instructor addresses importance of  mindfulness and meditation practice to help reduce stress and improve awareness.  Instructor also leads participants through a meditation exercise.  Flowsheet Row CARDIAC REHAB PHASE II EXERCISE from 06/16/2017 in [facility] CARDIAC REHAB  Date  06/16/17  Educator  Theda Belfast  Instruction Review Code  2- Demonstrated Understanding      Stretching for Flexibility and Mobility:  -Group instruction provided by verbal instruction, patient participation, and written materials to support subject.  Instructors lead participants through series of stretches that are designed to increase flexibility thus improving mobility.  These stretches are additional exercise for major muscle groups that are typically performed during regular warm up and cool down.   Hands Only CPR:  -Group verbal, video, and participation provides a basic overview of AHA guidelines for community CPR. Role-play of emergencies allow participants the opportunity to practice calling for help and chest compression technique with discussion of AED use.   Hypertension: -Group verbal and written instruction that provides a basic overview of hypertension including the most recent diagnostic guidelines, risk factor reduction with self-care instructions and medication management. Flowsheet Row CARDIAC REHAB PHASE II EXERCISE from 07/14/2017 in [facility] CARDIAC REHAB  Date  06/25/17  Educator  Gladstone Lighter  Instruction Review Code  2- Demonstrated Understanding       Nutrition I class: Heart Healthy Eating:  -Group instruction provided by PowerPoint slides, verbal discussion, and written materials to support subject matter. The instructor gives an explanation and review of the Therapeutic Lifestyle Changes diet recommendations, which includes a discussion on lipid goals, dietary fat, sodium, fiber, plant stanol/sterol esters, sugar, and the components of a well-balanced, healthy diet.   Nutrition  II class: Lifestyle Skills:  -Group instruction provided by PowerPoint slides, verbal discussion, and written materials to support subject matter. The instructor gives an explanation and review of label reading, grocery shopping for heart health, heart healthy recipe modifications, and ways to make healthier choices when eating out.   Diabetes Question & Answer:  -Group instruction provided by PowerPoint slides, verbal discussion, and written materials to support subject matter. The instructor gives an explanation and review of diabetes co-morbidities, pre- and post-prandial blood glucose goals, pre-exercise blood glucose goals, signs, symptoms, and treatment of hypoglycemia and hyperglycemia, and foot care basics.   Diabetes Blitz:  -Group instruction provided by PowerPoint slides, verbal discussion, and written materials to support subject matter. The instructor gives an explanation and review of the physiology behind type 1 and type 2 diabetes, diabetes medications and rational behind using different medications, pre- and post-prandial blood glucose recommendations and Hemoglobin A1c goals, diabetes diet, and exercise including blood glucose guidelines for exercising safely.    Portion Distortion:  -Group instruction provided by PowerPoint slides, verbal discussion, written materials, and food models to support subject matter. The instructor gives an explanation of serving size versus portion size, changes in portions sizes over the last 20 years, and what  consists of a serving from each food group. Flowsheet Row CARDIAC REHAB PHASE II EXERCISE from 06/11/2017 in [facility] CARDIAC REHAB  Date  04/28/17  Educator  RD  Instruction Review Code  2- Demonstrated Understanding      Stress Management:  -Group instruction provided by verbal instruction, video, and written materials to support subject matter.  Instructors review role of stress in heart disease and how to cope with  stress positively.   Flowsheet Row CARDIAC REHAB PHASE II EXERCISE from 07/14/2017 in [facility] CARDIAC REHAB  Date  06/30/17  Instruction Review Code  2- Demonstrated Understanding      Exercising on Your Own:  -Group instruction provided by verbal instruction, power point, and written materials to support subject.  Instructors discuss benefits of exercise, components of exercise, frequency and intensity of exercise, and end points for exercise.  Also discuss use of nitroglycerin and activating EMS.  Review options of places to exercise outside of rehab.  Review guidelines for sex with heart disease. Flowsheet Row CARDIAC REHAB PHASE II EXERCISE from 06/11/2017 in [facility] CARDIAC REHAB  Date  06/02/17  Instruction Review Code  2- Demonstrated Understanding      Cardiac Drugs I:  -Group instruction provided by verbal instruction and written materials to support subject.  Instructor reviews cardiac drug classes: antiplatelets, anticoagulants, beta blockers, and statins.  Instructor discusses reasons, side effects, and lifestyle considerations for each drug class. Flowsheet Row CARDIAC REHAB PHASE II EXERCISE from 06/11/2017 in [facility] CARDIAC REHAB  Date  06/09/17  Educator  -- [pharmacy]  Instruction Review Code  2- Demonstrated Understanding      Cardiac Drugs II:  -Group instruction provided by verbal instruction and written materials to support subject.  Instructor reviews cardiac drug classes: angiotensin converting enzyme inhibitors (ACE-I), angiotensin II receptor blockers (ARBs), nitrates, and calcium channel blockers.  Instructor discusses reasons, side effects, and lifestyle considerations for each drug class. Flowsheet Row CARDIAC REHAB PHASE II EXERCISE from 07/14/2017 in [facility] CARDIAC REHAB  Date  07/07/17  Instruction Review Code  2- Demonstrated Understanding      Anatomy and Physiology of  the Circulatory System:  Group verbal and written instruction and models provide basic cardiac anatomy and physiology, with the coronary electrical and arterial systems. Review of: AMI, Angina, Valve disease, Heart Failure, Peripheral Artery Disease, Cardiac Arrhythmia, Pacemakers, and the ICD. Flowsheet Row CARDIAC REHAB PHASE II EXERCISE from 07/14/2017 in [facility] CARDIAC REHAB  Date  07/14/17  Instruction Review Code  2- Demonstrated Understanding      Other Education:  -Group or individual verbal, written, or video instructions that support the educational goals of the cardiac rehab program.   Holiday Eating Survival Tips:  -Group instruction provided by PowerPoint slides, verbal discussion, and written materials to support subject matter. The instructor gives patients tips, tricks, and techniques to help them not only survive but enjoy the holidays despite the onslaught of food that accompanies the holidays.   Knowledge Questionnaire Score: Knowledge Questionnaire Score - 07/12/17 1515    Knowledge Questionnaire Score          Post Score  23/24           Core Components/Risk Factors/Patient Goals at Admission: Personal Goals and Risk Factors at Admission - 04/13/17 1449    Core Components/Risk Factors/Patient Goals on Admission           Weight Management  Yes;Obesity;Weight Maintenance;Weight Loss    Intervention  Weight Management: Develop a combined nutrition and exercise program designed to reach desired caloric intake, while maintaining appropriate intake of nutrient and fiber, sodium and fats, and appropriate energy expenditure required for the weight goal.;Weight Management: Provide education and appropriate resources to help participant work on and attain dietary goals.;Weight Management/Obesity: Establish reasonable short term and long term weight goals.;Obesity: Provide education and appropriate resources to help participant work on and attain dietary  goals.    Admit Weight  172 lb 2.9 oz (78.1 kg)    Goal Weight: Short Term  165 lb (74.8 kg)    Goal Weight: Long Term  150 lb (68 kg)    Expected Outcomes  Short Term: Continue to assess and modify interventions until short term weight is achieved;Long Term: Adherence to nutrition and physical activity/exercise program aimed toward attainment of established weight goal;Weight Maintenance: Understanding of the daily nutrition guidelines, which includes 25-35% calories from fat, 7% or less cal from saturated fats, less than 200mg  cholesterol, less than 1.5gm of sodium, & 5 or more servings of fruits and vegetables daily;Weight Loss: Understanding of general recommendations for a balanced deficit meal plan, which promotes 1-2 lb weight loss per week and includes a negative energy balance of [phone] kcal/d;Understanding recommendations for meals to include 15-35% energy as protein, 25-35% energy from fat, 35-60% energy from carbohydrates, less than 200mg  of dietary cholesterol, 20-35 gm of total fiber daily;Understanding of distribution of calorie intake throughout the day with the consumption of 4-5 meals/snacks    Improve shortness of breath with ADL's  Yes    Intervention  Provide education, individualized exercise plan and daily activity instruction to help decrease symptoms of SOB with activities of daily living.    Expected Outcomes  Short Term: Achieves a reduction of symptoms when performing activities of daily living.    Hypertension  Yes    Intervention  Provide education on lifestyle modifcations including regular physical activity/exercise, weight management, moderate sodium restriction and increased consumption of fresh fruit, vegetables, and low fat dairy, alcohol moderation, and smoking cessation.;Monitor prescription use compliance.    Expected Outcomes  Short Term: Continued assessment and intervention until BP is < 140/5mm HG in hypertensive participants. < 130/3mm HG in hypertensive  participants with diabetes, heart failure or chronic kidney disease.;Long Term: Maintenance of blood pressure at goal levels.    Lipids  Yes    Intervention  Provide education and support for participant on nutrition & aerobic/resistive exercise along with prescribed medications to achieve LDL 70mg , HDL >40mg .    Expected Outcomes  Short Term: Participant states understanding of desired cholesterol values and is compliant with medications prescribed. Participant is following exercise prescription and nutrition guidelines.;Long Term: Cholesterol controlled with medications as prescribed, with individualized exercise RX and with personalized nutrition plan. Value goals: LDL < 70mg , HDL > 40 mg.           Core Components/Risk Factors/Patient Goals Review:  Goals and Risk Factor Review    Core Components/Risk Factors/Patient Goals Review    Row Name 04/19/17 1417 05/20/17 1017 06/17/17 1116 07/14/17 1657   Personal Goals Review  Weight Management/Obesity;Lipids;Hypertension  Weight Management/Obesity;Lipids;Hypertension  Weight Management/Obesity;Lipids;Hypertension  Weight Management/Obesity;Lipids;Hypertension   Review  pt with multiple CAD RF demonstrates eagerness to participate in CR activities. pt personal goals are to stablize angina and medication adjustments, feel comfortable, decrease fear/health related anxiety by increasing self care knowledge.    pt with multiple CAD RF demonstrates eagerness to  participate in CR activities. pt health related anxiety improving as symptoms decrease and confidence increases.  pt demonstrates compliance with medication and lifestyle managment.     pt with multiple CAD RF demonstrates eagerness to participate in CR activities. pt health related anxiety improving as symptoms decrease and confidence increases pt demonstrates increased strength/stamina and appropriate physical activity limititations.   pt demonstrates compliance with medication and lifestyle  managment.     pt with multiple CAD RF demonstrates eagerness to participate in CR activities. pt health related anxiety improving as symptoms decrease and confidence increases pt demonstrates increased strength/stamina and appropriate physical activity limititations.   pt demonstrates compliance with medication and lifestyle managment.      Expected Outcomes  pt will participate in CR exercise, nutrition and lifestyle modification opportunities to decrease overall RF.   pt will participate in CR exercise, nutrition and lifestyle modification opportunities to decrease overall RF.   pt will participate in CR exercise, nutrition and lifestyle modification opportunities to decrease overall RF.   pt will participate in CR exercise, nutrition and lifestyle modification opportunities to decrease overall RF.           Core Components/Risk Factors/Patient Goals at Discharge (Final Review):  Goals and Risk Factor Review - 07/14/17 1657    Core Components/Risk Factors/Patient Goals Review          Personal Goals Review  Weight Management/Obesity;Lipids;Hypertension    Review  pt with multiple CAD RF demonstrates eagerness to participate in CR activities. pt health related anxiety improving as symptoms decrease and confidence increases pt demonstrates increased strength/stamina and appropriate physical activity limititations.   pt demonstrates compliance with medication and lifestyle managment.       Expected Outcomes  pt will participate in CR exercise, nutrition and lifestyle modification opportunities to decrease overall RF.            ITP Comments: ITP Comments    Row Name 04/13/17 1408 04/19/17 1415 05/20/17 1015 06/17/17 1115 07/14/17 1656   ITP Comments  Dr. Armanda Magic, Medical Director   pt started group exercise class today.  pt tolerated light activity without difficulty.   30 day ITP review. pt with health related anxiety demonstrates willingness to participate in CR opportunities with good  attendance and participation.   30 day ITP review. pt with health related anxiety demonstrates willingness to participate in CR opportunities with good attendance and participation.   30 day ITP review. pt with health related anxiety demonstrates willingness to participate in CR opportunities with good attendance and participation.       Comments:  Deveron Furlong, RN, BSN Cardiac Pulmonary Rehab 07/15/17 2:24 PM

## 2017-07-16 ENCOUNTER — Encounter (HOSPITAL_COMMUNITY): Payer: Self-pay

## 2017-07-16 ENCOUNTER — Encounter (HOSPITAL_COMMUNITY)
Admission: RE | Admit: 2017-07-16 | Discharge: 2017-07-16 | Disposition: A | Discharge status: Home / Self Care | Payer: Medicare Other | Source: Ambulatory Visit | Attending: Cardiology | Admitting: Cardiology

## 2017-07-16 DIAGNOSIS — Z955 Presence of coronary angioplasty implant and graft: Secondary | ICD-10-CM

## 2017-07-19 ENCOUNTER — Encounter (HOSPITAL_COMMUNITY): Payer: Medicare Other

## 2017-07-22 ENCOUNTER — Encounter: Payer: Self-pay | Admitting: Family Medicine

## 2017-07-22 ENCOUNTER — Ambulatory Visit (INDEPENDENT_AMBULATORY_CARE_PROVIDER_SITE_OTHER): Payer: Medicare Other | Admitting: Family Medicine

## 2017-07-22 VITALS — BP 100/60 | HR 56 | Temp 98.5°F | Ht 62.0 in | Wt 169.2 lb

## 2017-07-22 DIAGNOSIS — G4700 Insomnia, unspecified: Secondary | ICD-10-CM

## 2017-07-22 DIAGNOSIS — M542 Cervicalgia: Secondary | ICD-10-CM | POA: Diagnosis not present

## 2017-07-22 DIAGNOSIS — R7303 Prediabetes: Secondary | ICD-10-CM

## 2017-07-22 LAB — POCT GLYCOSYLATED HEMOGLOBIN (HGB A1C): HEMOGLOBIN A1C: 5.8

## 2017-07-22 MED ORDER — CYCLOBENZAPRINE HCL 10 MG PO TABS: ORAL_TABLET | ORAL | 1 refills | Status: DC

## 2017-07-22 MED ORDER — MELATONIN 3 MG PO TABS: 1.0000 | ORAL_TABLET | Freq: Every day | ORAL | 0 refills | Status: DC

## 2017-07-22 NOTE — Progress Notes (Signed)
Subjective:    Patient ID: [name] [name] , female   DOB: 1951/03/01 , 67 y.o..   MRN: [id number]  HPI  [name] [name] is a 67 yo F with PMH of joint pains, GERD, orthostatic hypotension, HTN, anxiety and depression, obesity here for  Chief Complaint  Patient presents with  . Diabetes    1. History of Prediabetes: Patient has a history of diabetes. A1C was 5.8 in June 2018. She notes she has been trying to eat healthier. She has just finished cardiac rehab (she had a stent placed in Dec 2018). She notes that she is going to try and exercise on her own with the "heart sisters" and the "silver sneakers"  2. INSOMNIA  Has had insomnia for months Trouble getting to sleep: yes Trouble waking up too early: no Naps during the day: no Usual times to bed and awake: goes to sleep around midnight to 3am, wakes up at 7:30am Medications tried: none New Medications: none   Symptoms Sweating at night: no Weight loss: no Shortness of breath or hemoptysis: no Muscle pain or weakness: yes does have some muscle spasms in neck for years, takes flexeril as needed Severe snoring or daytime sleepiness: no Feeling down or depressed: no Loss of enjoyment: no Leg or joint swelling: no  ROS see HPI Smoking Status noted  3. Muscle Spasms: Patient notes that she has had muscle spasms in her neck for years. Never had any trauma. Takes flexeril as needed for pain but ran out a couple weeks ago.   Past Medical History: Patient Active Problem List   Diagnosis Date Noted  . Insomnia 07/25/2017  . Unstable angina (HCC)   . Orthostatic hypotension 09/07/2016  . Right hip pain 08/19/2016  . Lumbar strain 04/27/2016  . Left knee pain 04/27/2016  . Finger pain, right 04/27/2016  . Neck pain 11/23/2015  . Low back pain 07/11/2015  . Right shoulder pain 04/25/2015  . GERD (gastroesophageal reflux disease) 01/28/2015  . Microcytic anemia 01/28/2015  . Near syncope 11/09/2014  . Leg cramps  11/15/2013  . Vitamin D deficiency 11/15/2013  . Prediabetes 03/01/2012  . Chest pain 08/18/2011  . Anxiety and depression 05/31/2009  . Mitral valve disorder 04/15/2009  . CARPAL TUNNEL SYNDROME 01/26/2008  . OBESITY 01/04/2008  . HYPERCHOLESTEROLEMIA 06/24/2006  . Essential hypertension 06/24/2006     Social Hx:  reports that she quit smoking about 12 years ago. She has a 10.00 pack-year smoking history. She has never used smokeless tobacco.   Objective:   BP 100/60 (BP Location: Left Arm, Patient Position: Sitting, Cuff Size: Normal)   Pulse (!) 56   Temp 98.5 F (36.9 C) (Oral)   Ht 5\' 2"  (1.575 m)   Wt 169 lb 3.2 oz (76.7 kg)   SpO2 99%   BMI 30.95 kg/m  Physical Exam  Gen: NAD, alert, cooperative with exam, well-appearing Psych: good insight, normal mood and affect  Assessment & Plan:  Prediabetes Stable. A1C 5.8 today. Given her age, will continue to monitor and no medications at this time.  -Follow up in 3 months for recheck -If A1C 7 or greater, can consider adding Metformin  Insomnia Trouble falling and staying asleep. Has some signs of poor sleep hygeine. The problem of recurrent insomnia was discussed.  -Avoidance of caffeine sources is strongly encouraged.  -Sleep hygiene issues reviewed and placed in AVS.  - Melatonin 3 mg qhs - Will attempt to avoid sedative hypnotics for now - Follow  up in 2 weeks, if no improvement can consider Trazodone   Neck pain Occasional cramps in neck relieved by muscle relaxer. No red flag symptoms. Likely muscle strain - Refilled flexeril   Orders Placed This Encounter  Procedures  . HgB A1c   Meds ordered this encounter  Medications  . Melatonin 3 MG TABS    Sig: Take 1 tablet (3 mg total) by mouth at bedtime.    Dispense:  30 tablet    Refill:  0  . cyclobenzaprine (FLEXERIL) 10 MG tablet    Sig: TAKE 10 MG BY MOUTH AT BEDTIME FOR BACK PAIN AND AS SLEEP AID    Dispense:  30 tablet    Refill:  1     Anders Simmonds, MD [facility], PGY-3

## 2017-07-22 NOTE — Patient Instructions (Signed)
Thank you for coming in today, it was so nice to see you! Today we talked about:    Sleep problems: I sent a prescription for melatonin to your pharmacy.  Please take 1 tablet I have our before you want to go to sleep.   Keep a consistent sleep schedule. Get up at the same time every day, even on weekends or during vacations.  Set a bedtime that is early enough for you to get at least 7 hours of sleep.  Don't go to bed unless you are sleepy.   If you don't fall asleep after 20 minutes, get out of bed.   Establish a relaxing bedtime routine.   Use your bed only for sleep and sex.   Make your bedroom quiet and relaxing. Keep the room at a comfortable, cool temperature.   Limit exposure to bright light in the evenings.  Turn off electronic devices at least 30 minutes before bedtime.  Don't eat a large meal before bedtime. If you are hungry at night, eat a light, healthy snack.   Exercise regularly and maintain a healthy diet.   Avoid consuming caffeine in the late afternoon or evening.   Avoid consuming alcohol before bedtime.   Reduce your fluid intake before bedtime.   Please follow up in 2 weeks for sleep follow up. You can schedule this appointment at the front desk before you leave or call the clinic.  Bring in all your medications or supplements to each appointment for review.   If we ordered any tests today, you will be notified via telephone of any abnormalities. If everything is normal you will get a letter in the mail.   If you have any questions or concerns, please do not hesitate to call the office at [phone]. You can also message me directly via MyChart.   Sincerely,  Smitty Cords, MD

## 2017-07-25 DIAGNOSIS — G4700 Insomnia, unspecified: Secondary | ICD-10-CM | POA: Insufficient documentation

## 2017-07-25 NOTE — Assessment & Plan Note (Signed)
Trouble falling and staying asleep. Has some signs of poor sleep hygeine. The problem of recurrent insomnia was discussed.  -Avoidance of caffeine sources is strongly encouraged.  -Sleep hygiene issues reviewed and placed in AVS.  - Melatonin 3 mg qhs - Will attempt to avoid sedative hypnotics for now - Follow up in 2 weeks, if no improvement can consider Trazodone

## 2017-07-25 NOTE — Assessment & Plan Note (Signed)
Stable. A1C 5.8 today. Given her age, will continue to monitor and no medications at this time.  -Follow up in 3 months for recheck -If A1C 7 or greater, can consider adding Metformin

## 2017-07-25 NOTE — Assessment & Plan Note (Addendum)
Occasional cramps in neck relieved by muscle relaxer. No red flag symptoms. Likely muscle strain - Refilled flexeril

## 2017-08-04 NOTE — Addendum Note (Signed)
Encounter addended by: Jewel Baize, RD on: 08/04/2017 1:58 PM  Actions taken: Flowsheet data copied forward, Visit Navigator Flowsheet section accepted

## 2017-08-17 ENCOUNTER — Other Ambulatory Visit: Payer: Self-pay | Admitting: Physician Assistant

## 2017-08-17 NOTE — Telephone Encounter (Signed)
REFILL 

## 2017-08-31 NOTE — Addendum Note (Signed)
Encounter addended by: Lowell Guitar, RN on: 08/31/2017 11:04 AM  Actions taken: Episode resolved

## 2017-08-31 NOTE — Progress Notes (Signed)
Discharge Progress Report  Patient Details  Name: [name] [name] MRN: [id number] Date of Birth: Jan 04, 1951 Referring Provider:   Flowsheet Row CARDIAC REHAB PHASE II ORIENTATION from 04/13/2017 in [facility]ARDI[facility]  Referring Provider  Olga Millers MD       Number of Visits: 36  Reason for Discharge:  Patient has met program and personal goals.  Smoking History:  Social History   Tobacco Use  Smoking Status Former Smoker  . Packs/day: 0.50  . Years: 20.00  . Pack years: 10.00  . Last attempt to quit: 07/30/2004  . Years since quitting: 13.0  Smokeless Tobacco Never Used    Diagnosis:  Stented coronary artery 03/01/17 S/P DES Ostial Ramus  ADL UCSD:   Initial Exercise Prescription: Initial Exercise Prescription - 04/13/17 1400    Date of Initial Exercise RX and Referring Provider          Date  04/13/17    Referring Provider  Olga Millers MD        Treadmill          MPH  2.5    Grade  1    Minutes  15    METs  3.26        Bike          Level  0.5    Minutes  15    METs  2.18        NuStep          Level  3    SPM  80    Minutes  10    METs  2        Prescription Details          Frequency (times per week)  3    Duration  Progress to 30 minutes of continuous aerobic without signs/symptoms of physical distress        Intensity          THRR 40-80% of Max Heartrate  67-122    Ratings of Perceived Exertion  11-13    Perceived Dyspnea  0-4        Progression          Progression  Continue to progress workloads to maintain intensity without signs/symptoms of physical distress.        Resistance Training          Training Prescription  Yes    Weight  2lbs    Reps  10-15           Discharge Exercise Prescription (Final Exercise Prescription Changes): Exercise Prescription Changes - 07/16/17 0753    Response to Exercise          Blood Pressure (Admit)  140/64    Blood Pressure (Exercise)  138/58     Blood Pressure (Exit)  124/68    Heart Rate (Admit)  66 bpm    Heart Rate (Exercise)  99 bpm    Heart Rate (Exit)  58 bpm    Rating of Perceived Exertion (Exercise)  12    Symptoms  none    Duration  Continue with 30 min of aerobic exercise without signs/symptoms of physical distress.    Intensity  THRR unchanged        Progression          Progression  Continue to progress workloads to maintain intensity without signs/symptoms of physical distress.    Average METs  4.2        Resistance Training  Training Prescription  Yes    Weight  4lbs    Reps  10-15    Time  10 Minutes        Treadmill          MPH  3.2 dc due to CP at home     Grade  2    Minutes  15    METs  4.3        NuStep          Level  5    SPM  80    Minutes  15    METs  4        Home Exercise Plan          Plans to continue exercise at  Home (comment) walking    Frequency  Add 2 additional days to program exercise sessions.    Initial Home Exercises Provided  04/26/17           Functional Capacity: 6 Minute Walk    6 Minute Walk    Row Name 04/13/17 1444 04/13/17 1457 07/08/17 1138   Phase  Initial  no documentation  Discharge   Distance  1659 feet  no documentation  1800 feet   Distance % Change  no documentation  no documentation  8.5 %   Distance Feet Change  no documentation  no documentation  141 ft   Walk Time  6 minutes  no documentation  6 minutes   # of Rest Breaks  0  no documentation  0   MPH  3.14  no documentation  3.4   METS  3.5  no documentation  3.8   RPE  11  no documentation  11   VO2 Peak  12.1  no documentation  13.2   Symptoms  No  no documentation  No   Resting HR  58 bpm  no documentation  60 bpm   Resting BP  114/72  no documentation  138/62   Resting Oxygen Saturation   98 %  no documentation  no documentation   Exercise Oxygen Saturation  during 6 min walk  97 %  no documentation  no documentation   Max Ex. HR  98 bpm  no documentation  109 bpm    Max Ex. BP  142/60  no documentation  136/64   2 Minute Post BP  no documentation  124/64  112/60          Psychological, QOL, Others - Outcomes: PHQ 2/9: Depression screen [facility] 2/9 07/22/2017 07/16/2017 04/19/2017 01/08/2017 09/04/2016  Decreased Interest 0 0 0 2 0  Down, Depressed, Hopeless 0 0 1 1 0  PHQ - 2 Score 0 0 1 3 0  Altered sleeping - - - 3 -  Tired, decreased energy - - - 2 -  Change in appetite - - - 3 -  Feeling bad or failure about yourself  - - - 0 -  Trouble concentrating - - - 2 -  Moving slowly or fidgety/restless - - - 2 -  Suicidal thoughts - - - 0 -  PHQ-9 Score - - - 15 -  Some recent data might be hidden    Quality of Life: Quality of Life - 07/12/17 1515    Quality of Life Scores          Health/Function Pre  21.13 %    Health/Function Post  25.03 %    Health/Function % Change  18.46 %    Socioeconomic Pre  24.94 %    Socioeconomic Post  27 %    Socioeconomic % Change   8.26 %    Psych/Spiritual Pre  26.57 %    Psych/Spiritual Post  29.14 %    Psych/Spiritual % Change  9.67 %    Family Pre  28.8 %    Family Post  27.6 %    Family % Change  -4.17 %    GLOBAL Pre  24.19 %    GLOBAL Post  26.67 %    GLOBAL % Change  10.25 %           Personal Goals: Goals established at orientation with interventions provided to work toward goal. Personal Goals and Risk Factors at Admission - 04/13/17 1449    Core Components/Risk Factors/Patient Goals on Admission           Weight Management  Yes;Obesity;Weight Maintenance;Weight Loss    Intervention  Weight Management: Develop a combined nutrition and exercise program designed to reach desired caloric intake, while maintaining appropriate intake of nutrient and fiber, sodium and fats, and appropriate energy expenditure required for the weight goal.;Weight Management: Provide education and appropriate resources to help participant work on and attain dietary goals.;Weight Management/Obesity: Establish  reasonable short term and long term weight goals.;Obesity: Provide education and appropriate resources to help participant work on and attain dietary goals.    Admit Weight  172 lb 2.9 oz (78.1 kg)    Goal Weight: Short Term  165 lb (74.8 kg)    Goal Weight: Long Term  150 lb (68 kg)    Expected Outcomes  Short Term: Continue to assess and modify interventions until short term weight is achieved;Long Term: Adherence to nutrition and physical activity/exercise program aimed toward attainment of established weight goal;Weight Maintenance: Understanding of the daily nutrition guidelines, which includes 25-35% calories from fat, 7% or less cal from saturated fats, less than 200mg  cholesterol, less than 1.5gm of sodium, & 5 or more servings of fruits and vegetables daily;Weight Loss: Understanding of general recommendations for a balanced deficit meal plan, which promotes 1-2 lb weight loss per week and includes a negative energy balance of [phone] kcal/d;Understanding recommendations for meals to include 15-35% energy as protein, 25-35% energy from fat, 35-60% energy from carbohydrates, less than 200mg  of dietary cholesterol, 20-35 gm of total fiber daily;Understanding of distribution of calorie intake throughout the day with the consumption of 4-5 meals/snacks    Improve shortness of breath with ADL's  Yes    Intervention  Provide education, individualized exercise plan and daily activity instruction to help decrease symptoms of SOB with activities of daily living.    Expected Outcomes  Short Term: Achieves a reduction of symptoms when performing activities of daily living.    Hypertension  Yes    Intervention  Provide education on lifestyle modifcations including regular physical activity/exercise, weight management, moderate sodium restriction and increased consumption of fresh fruit, vegetables, and low fat dairy, alcohol moderation, and smoking cessation.;Monitor prescription use compliance.    Expected  Outcomes  Short Term: Continued assessment and intervention until BP is < 140/26mm HG in hypertensive participants. < 130/59mm HG in hypertensive participants with diabetes, heart failure or chronic kidney disease.;Long Term: Maintenance of blood pressure at goal levels.    Lipids  Yes    Intervention  Provide education and support for participant on nutrition & aerobic/resistive exercise along with prescribed medications to achieve LDL 70mg , HDL >40mg .    Expected Outcomes  Short Term: Participant states understanding  of desired cholesterol values and is compliant with medications prescribed. Participant is following exercise prescription and nutrition guidelines.;Long Term: Cholesterol controlled with medications as prescribed, with individualized exercise RX and with personalized nutrition plan. Value goals: LDL < 70mg , HDL > 40 mg.            Personal Goals Discharge: Goals and Risk Factor Review    Core Components/Risk Factors/Patient Goals Review    Row Name 04/19/17 1417 05/20/17 1017 06/17/17 1116 07/14/17 1657 07/16/17 1417   Personal Goals Review  Weight Management/Obesity;Lipids;Hypertension  Weight Management/Obesity;Lipids;Hypertension  Weight Management/Obesity;Lipids;Hypertension  Weight Management/Obesity;Lipids;Hypertension  Weight Management/Obesity;Lipids;Hypertension   Review  pt with multiple CAD RF demonstrates eagerness to participate in CR activities. pt personal goals are to stablize angina and medication adjustments, feel comfortable, decrease fear/health related anxiety by increasing self care knowledge.    pt with multiple CAD RF demonstrates eagerness to participate in CR activities. pt health related anxiety improving as symptoms decrease and confidence increases.  pt demonstrates compliance with medication and lifestyle managment.     pt with multiple CAD RF demonstrates eagerness to participate in CR activities. pt health related anxiety improving as symptoms decrease  and confidence increases pt demonstrates increased strength/stamina and appropriate physical activity limititations.   pt demonstrates compliance with medication and lifestyle managment.     pt with multiple CAD RF demonstrates eagerness to participate in CR activities. pt health related anxiety improving as symptoms decrease and confidence increases pt demonstrates increased strength/stamina and appropriate physical activity limititations.   pt demonstrates compliance with medication and lifestyle managment.     pt with multiple CAD RF demonstrates eagerness to participate in CR activities. pt health related anxiety significantly improved.  pt pleased with her increased confidence and knowledge about CAD RF reducation.  pt plans to continue exercising at [facility].  Symptoms decreased and confidence increased pt demonstrates increased strength/stamina and appropriate physical activity limititations.  pt planning BIKE ride in [id number].    Expected Outcomes  pt will participate in CR exercise, nutrition and lifestyle modification opportunities to decrease overall RF.   pt will participate in CR exercise, nutrition and lifestyle modification opportunities to decrease overall RF.   pt will participate in CR exercise, nutrition and lifestyle modification opportunities to decrease overall RF.   pt will participate in CR exercise, nutrition and lifestyle modification opportunities to decrease overall RF.   pt will participate in exercise, nutrition and lifestyle modification opportunities to decrease overall RF.           Exercise Goals and Review: Exercise Goals    Exercise Goals    Row Name 04/13/17 1448   Increase Physical Activity  Yes   Intervention  Provide advice, education, support and counseling about physical activity/exercise needs.;Develop an individualized exercise prescription for aerobic and resistive training based on initial evaluation findings, risk stratification, comorbidities and  participant's personal goals.   Expected Outcomes  Achievement of increased cardiorespiratory fitness and enhanced flexibility, muscular endurance and strength shown through measurements of functional capacity and personal statement of participant.   Increase Strength and Stamina  Yes   Intervention  Provide advice, education, support and counseling about physical activity/exercise needs.;Develop an individualized exercise prescription for aerobic and resistive training based on initial evaluation findings, risk stratification, comorbidities and participant's personal goals.   Expected Outcomes  Achievement of increased cardiorespiratory fitness and enhanced flexibility, muscular endurance and strength shown through measurements of functional capacity and personal statement of participant.   Able to understand  and use rate of perceived exertion (RPE) scale  Yes   Intervention  Provide education and explanation on how to use RPE scale   Expected Outcomes  Short Term: Able to use RPE daily in rehab to express subjective intensity level;Long Term:  Able to use RPE to guide intensity level when exercising independently   Knowledge and understanding of Target Heart Rate Range (THRR)  Yes   Intervention  Provide education and explanation of THRR including how the numbers were predicted and where they are located for reference   Expected Outcomes  Short Term: Able to use daily as guideline for intensity in rehab;Short Term: Able to state/look up THRR;Long Term: Able to use THRR to govern intensity when exercising independently   Able to check pulse independently  Yes   Intervention  Provide education and demonstration on how to check pulse in carotid and radial arteries.;Review the importance of being able to check your own pulse for safety during independent exercise   Expected Outcomes  Short Term: Able to explain why pulse checking is important during independent exercise;Long Term: Able to check pulse  independently and accurately   Understanding of Exercise Prescription  Yes   Intervention  Provide education, explanation, and written materials on patient's individual exercise prescription   Expected Outcomes  Short Term: Able to explain program exercise prescription;Long Term: Able to explain home exercise prescription to exercise independently          Nutrition & Weight - Outcomes: Pre Biometrics - 04/13/17 1449    Pre Biometrics          Height  5\' 2"  (1.575 m)    Weight  172 lb 2.9 oz (78.1 kg)    Waist Circumference  36.25 inches    Hip Circumference  44 inches    Waist to Hip Ratio  0.82 %    BMI (Calculated)  31.48    Triceps Skinfold  30 mm    % Body Fat  42.1 %    Grip Strength  35 kg    Flexibility  11 in    Single Leg Stand  22 seconds          Post Biometrics - 07/08/17 1139     Post  Biometrics          Height  5\' 2"  (1.575 m)    Weight  167 lb 8.8 oz (76 kg)    Waist Circumference  34 inches    Hip Circumference  42.5 inches    Waist to Hip Ratio  0.8 %    BMI (Calculated)  30.64    Triceps Skinfold  27 mm    % Body Fat  40.2 %    Grip Strength  36 kg    Flexibility  12.25 in    Single Leg Stand  25 seconds           Nutrition: Nutrition Therapy & Goals - 04/14/17 1150    Nutrition Therapy          Diet  Heart Healthy        Personal Nutrition Goals          Nutrition Goal  Pt to identify food quantities necessary to achieve weight loss of 6-15 lb (2.7-10.9 kg) at graduation from cardiac rehab.    Personal Goal #2  Pt to identify and limit food sources of saturated fat, trans fat, and sodium        Intervention Plan  Intervention  Prescribe, educate and counsel regarding individualized specific dietary modifications aiming towards targeted core components such as weight, hypertension, lipid management, diabetes, heart failure and other comorbidities.    Expected Outcomes  Short Term Goal: Understand basic principles of dietary  content, such as calories, fat, sodium, cholesterol and nutrients.;Long Term Goal: Adherence to prescribed nutrition plan.           Nutrition Discharge: Nutrition Assessments - 08/04/17 1356    MEDFICTS Scores          Pre Score  51    Post Score  39    Score Difference  -12           Education Questionnaire Score: Knowledge Questionnaire Score - 07/12/17 1515    Knowledge Questionnaire Score          Post Score  23/24           Goals reviewed with patient; copy given to patient.

## 2017-08-31 NOTE — Addendum Note (Signed)
Encounter addended by: Lowell Guitar, RN on: 08/31/2017 10:59 AM  Actions taken: Sign clinical note

## 2017-09-01 NOTE — Progress Notes (Signed)
Subjective:    Patient ID: [name] [name] , female   DOB: Jul 18, 1950 , 67 y.o..   MRN: [id number]  HPI  [name] [name] is a 67 yo F with PMH of joint pains, GERD, orthostatic hypotension, HTN, anxiety and depression, obesityhere for   Chief Complaint  Patient presents with  . right flank pain    x 1 week    1. BACK PAIN  Back pain began 7 days ago after riding on the motorcycle all day. Pain is described as sharp. Patient has tried heating pad with some relief . Pain radiates from her right flank to the side History of trauma or injury: no Patient believes might be causing their pain: unsure but she is worried about her kidneys  Prior history of similar pain: no History of cancer: no Weak immune system:  no History of IV drug use: no History of steroid use: no  Symptoms Incontinence of bowel or bladder:  no Numbness of leg: no Fever: no Rest or Night pain: no Weight Loss:  no Rash: no  ROS see HPI Smoking Status noted.  Past Medical History: Patient Active Problem List   Diagnosis Date Noted  . Insomnia 07/25/2017  . Unstable angina (HCC)   . Orthostatic hypotension 09/07/2016  . Right hip pain 08/19/2016  . Lumbar strain 04/27/2016  . Left knee pain 04/27/2016  . Finger pain, right 04/27/2016  . Neck pain 11/23/2015  . Low back pain 07/11/2015  . Right shoulder pain 04/25/2015  . GERD (gastroesophageal reflux disease) 01/28/2015  . Microcytic anemia 01/28/2015  . Near syncope 11/09/2014  . Leg cramps 11/15/2013  . Vitamin D deficiency 11/15/2013  . Prediabetes 03/01/2012  . Chest pain 08/18/2011  . Anxiety and depression 05/31/2009  . Mitral valve disorder 04/15/2009  . CARPAL TUNNEL SYNDROME 01/26/2008  . OBESITY 01/04/2008  . HYPERCHOLESTEROLEMIA 06/24/2006  . Essential hypertension 06/24/2006    Medications: reviewed   Social Hx:  reports that she quit smoking about 13 years ago. She has a 10.00 pack-year smoking history. She has  never used smokeless tobacco.   Objective:   BP 120/60 (BP Location: Right Arm, Patient Position: Sitting, Cuff Size: Normal)   Pulse 69   Temp 98.1 F (36.7 C) (Oral)   Wt 171 lb 12.8 oz (77.9 kg)   SpO2 98%   BMI 31.42 kg/m  Physical Exam  Gen: NAD, alert, cooperative with exam, well-appearing Back Exam:  Inspection: Unremarkable  Palpable tenderness: Tender to palpation in last intercostal space on right flank area  Range of Motion:  Full in all directions but pain felt with side movements Leg strength: Quad: 5/5 Hamstring: 5/5 Hip flexor: 5/5 Hip abductors: 5/5  Strength at foot: Plantar-flexion: 5/5 Dorsi-flexion: 5/5 Eversion: 5/5 Inversion: 5/5  Sensory change: Gross sensation intact to all lumbar and sacral dermatomes.  Reflexes:not performed Gait unremarkable.  Assessment & Plan:   1. Right flank pain: It is not on entire flank but in between last intercostal space, appears more musculoskeletal in etiology rather than nephrotic however patient is worried about her kidneys.  Likely muscle strain versus nerve irritation after riding a motorcycle all day.  No red flag symptoms. - POCT urinalysis dipstick -Tylenol 500 mg every 8 hours as needed for pain, she also has Flexeril at home that she can use -Discussed no NSAIDs secondary to CAD -Gentle stretching -Return precautions discussed  Anders Simmonds, MD [facility], PGY-3

## 2017-09-02 ENCOUNTER — Ambulatory Visit (INDEPENDENT_AMBULATORY_CARE_PROVIDER_SITE_OTHER): Payer: Medicare Other | Admitting: Family Medicine

## 2017-09-02 VITALS — BP 120/60 | HR 69 | Temp 98.1°F | Wt 171.8 lb

## 2017-09-02 DIAGNOSIS — R109 Unspecified abdominal pain: Secondary | ICD-10-CM

## 2017-09-02 LAB — POCT URINALYSIS DIP (MANUAL ENTRY)
BILIRUBIN UA: NEGATIVE
GLUCOSE UA: NEGATIVE mg/dL
Ketones, POC UA: NEGATIVE mg/dL
Leukocytes, UA: NEGATIVE
NITRITE UA: NEGATIVE
Protein Ur, POC: NEGATIVE mg/dL
Spec Grav, UA: 1.015 (ref 1.010–1.025)
Urobilinogen, UA: 0.2 E.U./dL
pH, UA: 6 (ref 5.0–8.0)

## 2017-09-02 LAB — POCT UA - MICROSCOPIC ONLY

## 2017-09-02 NOTE — Patient Instructions (Signed)
Thank you for coming in today, it was so nice to see you! Today we talked about:    Back pain: I think this is likely a muscle strain.  Possibly from riding on a motorcycle.  Please take Tylenol 500 mg every 8 hours as needed for pain.  You can also take your home cyclobenzaprine (muscle relaxer) as prescribed.  Because of your heart history it would be best to avoid meloxicam, ibuprofen, Advil  Continue gentle stretching as we discussed  We are checking your urine today.  If anything is abnormal I will call you otherwise he will get a letter in the mail  If you have any questions or concerns, please do not hesitate to call the office at [phone]. You can also message me directly via MyChart.   Sincerely,  Smitty Cords, MD

## 2017-09-07 ENCOUNTER — Telehealth: Payer: Self-pay | Admitting: Family Medicine

## 2017-09-07 NOTE — Telephone Encounter (Signed)
Pt called and said she is still having a lot of pain in her right side around to her back. She doesn't know if she needs to make another appointment or not. She would like Dr Juanito Doom to give her a call.

## 2017-09-08 NOTE — Telephone Encounter (Signed)
Returned patient's call.  She states that she has persistent pain in her right flank.  She notes that the muscle relaxers and Tylenol have not been helping very much.  Her pain is worse when she lays on her back or eats a lot.  It would be unusual for musculoskeletal pain to be associated with food, suggested that she come back so we can reevaluate her.  Offered her an appointment with me on the 18th, she accepted.  She will make a earlier appointment if the pain becomes more troublesome.  Smitty Cords, MD [place], PGY-3

## 2017-09-12 ENCOUNTER — Other Ambulatory Visit: Payer: Self-pay | Admitting: Family Medicine

## 2017-09-13 ENCOUNTER — Other Ambulatory Visit: Payer: Self-pay | Admitting: Physician Assistant

## 2017-09-15 ENCOUNTER — Other Ambulatory Visit: Payer: Self-pay

## 2017-09-15 ENCOUNTER — Encounter: Payer: Self-pay | Admitting: Family Medicine

## 2017-09-15 ENCOUNTER — Ambulatory Visit (INDEPENDENT_AMBULATORY_CARE_PROVIDER_SITE_OTHER): Payer: Medicare Other | Admitting: Family Medicine

## 2017-09-15 VITALS — BP 96/58 | HR 63 | Temp 98.3°F | Wt 172.0 lb

## 2017-09-15 DIAGNOSIS — R1011 Right upper quadrant pain: Secondary | ICD-10-CM

## 2017-09-15 DIAGNOSIS — M549 Dorsalgia, unspecified: Secondary | ICD-10-CM | POA: Diagnosis not present

## 2017-09-15 NOTE — Patient Instructions (Signed)
Thank you for coming in today, it was so nice to see you! Today we talked about:    Right sided pain: We will need to make sure that there is something going on with her gallbladder or liver.  We have ordered a ultrasound to get a better picture.  We have also ordered some blood work to make sure that your gallbladder and kidneys are okay.  Continue taking Tylenol as needed for pain.  Use go to the hospital if you have any fevers, the pain gets worse, you have any nausea or vomiting  If we ordered any tests today, you will be notified via telephone of any abnormalities. If everything is normal you will get a letter in the mail.   If you have any questions or concerns, please do not hesitate to call the office at [phone]. You can also message me directly via MyChart.   Sincerely,  Smitty Cords, MD

## 2017-09-15 NOTE — Progress Notes (Signed)
Subjective:    Patient ID: [name] , female   DOB: Sep 04, 1950 , 67 y.o..   MRN: [id number]  HPI  [name] [name] is a 67 yo F with PMH of joint pains, GERD, orthostatic hypotension, HTN, anxiety and depression, obesityhere for   Chief Complaint  Patient presents with  . Flank Pain    1. Right sided back pain  Back pain began x 2 weeks ago after riding on the motorcycle all day. Pain is described as sharp. Patient has tried heating pad with some relief . Patient endorses that Tylenol helps the pain and Flexeril does not. Pain radiates from her right flank to the side History of trauma or injury: no Patient does endorse postprandial pain.  She notes that midway through eating her meal she will experience right sided back pain that radiates to her right side  Prior history of similar pain: no History of cancer: no Weak immune system:  no History of IV drug use: no History of steroid use: no  Symptoms Incontinence of bowel or bladder:  no Numbness of leg: no Fever: no Rest or Night pain: no Weight Loss:  no Rash: no  ROS see HPI Smoking Status noted.  Past Medical History: Patient Active Problem List   Diagnosis Date Noted  . Insomnia 07/25/2017  . Unstable angina (HCC)   . Orthostatic hypotension 09/07/2016  . Right hip pain 08/19/2016  . Lumbar strain 04/27/2016  . Left knee pain 04/27/2016  . Finger pain, right 04/27/2016  . Neck pain 11/23/2015  . Low back pain 07/11/2015  . Right shoulder pain 04/25/2015  . GERD (gastroesophageal reflux disease) 01/28/2015  . Microcytic anemia 01/28/2015  . Near syncope 11/09/2014  . Leg cramps 11/15/2013  . Vitamin D deficiency 11/15/2013  . Prediabetes 03/01/2012  . Chest pain 08/18/2011  . Anxiety and depression 05/31/2009  . Mitral valve disorder 04/15/2009  . CARPAL TUNNEL SYNDROME 01/26/2008  . OBESITY 01/04/2008  . HYPERCHOLESTEROLEMIA 06/24/2006  . Essential hypertension 06/24/2006     Medications: reviewed   Social Hx:  reports that she quit smoking about 13 years ago. She has a 10.00 pack-year smoking history. She has never used smokeless tobacco.   Objective:   BP (!) 96/58   Pulse 63   Temp 98.3 F (36.8 C) (Oral)   Wt 172 lb (78 kg)   SpO2 98%   BMI 31.46 kg/m  Physical Exam  Gen: NAD, alert, cooperative with exam, well-appearing Back Exam:  Inspection: Unremarkable  Palpable tenderness: Tender to palpation in last intercostal space on right flank area  Range of Motion:  Full in all directions but pain felt with side movements Leg strength: Quad: 5/5 Hamstring: 5/5 Hip flexor: 5/5 Hip abductors: 5/5  Strength at foot: Plantar-flexion: 5/5 Dorsi-flexion: 5/5 Eversion: 5/5 Inversion: 5/5  Sensory change: Gross sensation intact to all lumbar and sacral dermatomes.  Reflexes:not performed Gait unremarkable. Abdomen: Soft, nondistended, mild tenderness in right upper quadrant, nontender in all other quadrants.  Normal bowel sounds.  No masses palpated  Assessment & Plan:   1. Right sided back pain, mild right upper quadrant tenderness: Back pain has some characteristics of musculoskeletal etiology as it is worse with movement however she is having postprandial right sided mid back pain that radiates to the mid axillary line.  Wondering if there is some gallbladder etiology here.  Abdominal exam showing mild right upper quadrant with tenderness in the last intercostal space of the  tenderness low right  back.  -At this time will order a right upper quadrant ultrasound to rule out gallbladder etiology -CMP, CBC, lipase -Tylenol 500 mg every 8 hours as needed for pain, she also has Flexeril at home that she can use -Discussed no NSAIDs secondary to CAD -Gentle stretching -We will follow-up with patient after her ultrasound is resulted  Anders Simmonds, MD [facility], PGY-3

## 2017-09-16 LAB — COMPREHENSIVE METABOLIC PANEL
ALT: 15 IU/L (ref 0–32)
AST: 13 IU/L (ref 0–40)
Albumin/Globulin Ratio: 1.6 (ref 1.2–2.2)
Albumin: 3.9 g/dL (ref 3.6–4.8)
Alkaline Phosphatase: 68 IU/L (ref 39–117)
BUN/Creatinine Ratio: 13 (ref 12–28)
BUN: 13 mg/dL (ref 8–27)
Bilirubin Total: 0.5 mg/dL (ref 0.0–1.2)
CALCIUM: 9.4 mg/dL (ref 8.7–10.3)
CO2: 22 mmol/L (ref 20–29)
CREATININE: 1 mg/dL (ref 0.57–1.00)
Chloride: 105 mmol/L (ref 96–106)
GFR calc Af Amer: 67 mL/min/{1.73_m2} (ref >59)
GFR, EST NON AFRICAN AMERICAN: 58 mL/min/{1.73_m2} — AB (ref >59)
GLOBULIN, TOTAL: 2.5 g/dL (ref 1.5–4.5)
Glucose: 98 mg/dL (ref 65–99)
Potassium: 4.4 mmol/L (ref 3.5–5.2)
SODIUM: 141 mmol/L (ref 134–144)
Total Protein: 6.4 g/dL (ref 6.0–8.5)

## 2017-09-16 LAB — CBC
HEMATOCRIT: 38.1 % (ref 34.0–46.6)
HEMOGLOBIN: 12 g/dL (ref 11.1–15.9)
MCH: 21.9 pg — AB (ref 26.6–33.0)
MCHC: 31.5 g/dL (ref 31.5–35.7)
MCV: 70 fL — AB (ref 79–97)
Platelets: 339 10*3/uL (ref 150–450)
RBC: 5.48 x10E6/uL — AB (ref 3.77–5.28)
RDW: 17.3 % — ABNORMAL HIGH (ref 12.3–15.4)
WBC: 6.5 10*3/uL (ref 3.4–10.8)

## 2017-09-16 LAB — LIPASE: Lipase: 31 U/L (ref 14–72)

## 2017-09-17 ENCOUNTER — Ambulatory Visit (HOSPITAL_COMMUNITY)
Admission: RE | Admit: 2017-09-17 | Discharge: 2017-09-17 | Disposition: A | Discharge status: Home / Self Care | Payer: Medicare Other | Source: Ambulatory Visit | Attending: Family Medicine | Admitting: Family Medicine

## 2017-09-17 DIAGNOSIS — R1011 Right upper quadrant pain: Secondary | ICD-10-CM

## 2017-09-17 DIAGNOSIS — K7689 Other specified diseases of liver: Secondary | ICD-10-CM | POA: Insufficient documentation

## 2017-09-20 ENCOUNTER — Telehealth: Payer: Self-pay | Admitting: Family Medicine

## 2017-09-20 NOTE — Telephone Encounter (Signed)
Call patient to discuss her ultrasound results.  No answer, left voicemail.  Did not leave results on voicemail.  Smitty Cords, MD [place], PGY-3

## 2017-09-21 NOTE — Telephone Encounter (Signed)
Called patient to discuss labs and ultrasound. Her CMP and CBC were normal. RUQ U/S showing normal gallbladder and biliary ducts. There were two benign-appearing small cysts in the liver.  Patient states she is still having the right sided pain but it has been getting a little better because it is not occurring as often. Discussed that her exam is consistent with MSK pain. Also discussed that we could get a CT renal stone to rule out kidney stones as a potential culprit. Patient notes that she would prefer to hold off for now and see how she does. She will continue to take Tylenol as needed. She was appreciate of the call.   Smitty Cords, MD [place], PGY-3

## 2017-09-21 NOTE — Telephone Encounter (Signed)
Pt returns call.  You can reach her @ [phone]. Tj Kitchings, Salome Spotted, CMA

## 2017-09-23 ENCOUNTER — Other Ambulatory Visit: Payer: Self-pay | Admitting: *Deleted

## 2017-09-23 MED ORDER — AMLODIPINE BESYLATE 5 MG PO TABS: 5.0000 mg | ORAL_TABLET | Freq: Every day | ORAL | 3 refills | Status: DC

## 2017-09-25 ENCOUNTER — Other Ambulatory Visit: Payer: Self-pay | Admitting: Family Medicine

## 2017-09-29 ENCOUNTER — Other Ambulatory Visit: Payer: Self-pay | Admitting: *Deleted

## 2017-09-29 MED ORDER — SERTRALINE HCL 100 MG PO TABS: 100.0000 mg | ORAL_TABLET | Freq: Every day | ORAL | 3 refills | Status: DC

## 2017-11-17 NOTE — Progress Notes (Signed)
HPI: FUCAD.AlsoH/O MVPand orthostasis. Carotid Dopplers 2010 showed no significant stenosis.Echocardiogram repeated September 2016 and showed normal LV function.Patient had chest pain in November followed by abnormal nuclear study.Patient had cardiac catheterization December 2018 that showed an 80% ostial ramus and ejection fraction 50-55%. Patient had PCI with drug-eluting stent.  Seen with atypical chest pain January 2019 felt not to be cardiac.  Since last seen,patient denies dyspnea, chest pain, palpitations or syncope.  Current Outpatient Medications  Medication Sig Dispense Refill  . amLODipine (NORVASC) 5 MG tablet Take 1 tablet (5 mg total) by mouth daily. 90 tablet 3  . aspirin 81 MG tablet Take 81 mg by mouth daily.    [name] atorvastatin (LIPITOR) 40 MG tablet TAKE 1 TABLET BY MOUTH EVERY DAY 30 tablet 11  . clopidogrel (PLAVIX) 75 MG tablet TAKE 1 TABLET (75 MG TOTAL) BY MOUTH DAILY WITH BREAKFAST. 90 tablet 2  . cyclobenzaprine (FLEXERIL) 10 MG tablet TAKE 10 MG BY MOUTH AT BEDTIME FOR BACK PAIN AND AS SLEEP AID 30 tablet 1  . ferrous sulfate 325 (65 FE) MG tablet TAKE 1 TABLET BY MOUTH EVERY DAY WITH BREAKFAST 30 tablet 0  . fluticasone (FLONASE) 50 MCG/ACT nasal spray Place 1 spray daily into both nostrils.    . Melatonin 3 MG TABS Take 1 tablet (3 mg total) by mouth at bedtime. 30 tablet 0  . metoprolol tartrate (LOPRESSOR) 25 MG tablet Take 12.5 mg by mouth 2 (two) times daily.    . pantoprazole (PROTONIX) 40 MG tablet Take 1 tablet (40 mg total) by mouth 2 (two) times daily. 180 tablet 3  . sertraline (ZOLOFT) 100 MG tablet Take 1 tablet (100 mg total) by mouth daily. 30 tablet 3  . nitroGLYCERIN (NITROSTAT) 0.4 MG SL tablet Place 1 tablet (0.4 mg total) under the tongue every 5 (five) minutes as needed for chest pain. 25 tablet 5   No current facility-administered medications for this visit.      Past Medical History:  Diagnosis Date  . Coronary artery disease     . Depression   . Hyperlipidemia   . Hypertension   . Microcytic anemia   . Mitral valve prolapse     Past Surgical History:  Procedure Laterality Date  . CARDIAC CATHETERIZATION    . CORONARY STENT INTERVENTION N/A 03/01/2017   Procedure: CORONARY STENT INTERVENTION;  Surgeon: Burnell Blanks, MD;  Location: [facility];  Service: Cardiovascular;  Laterality: N/A;  . INTRAVASCULAR PRESSURE WIRE/FFR STUDY N/A 03/01/2017   Procedure: INTRAVASCULAR PRESSURE WIRE/FFR STUDY;  Surgeon: Burnell Blanks, MD;  Location: [facility];  Service: Cardiovascular;  Laterality: N/A;  . LEFT HEART CATH AND CORONARY ANGIOGRAPHY N/A 03/01/2017   Procedure: LEFT HEART CATH AND CORONARY ANGIOGRAPHY;  Surgeon: Burnell Blanks, MD;  Location: [facility];  Service: Cardiovascular;  Laterality: N/A;  . PARTIAL HYSTERECTOMY      Social History   Socioeconomic History  . Marital status: Married    Spouse name: Not on file  . Number of children: 3  . Years of education: Not on file  . Highest education level: Not on file  Occupational History    Comment: Retired  Scientific laboratory technician  . Financial resource strain: Not on file  . Food insecurity:    Worry: Not on file    Inability: Not on file  . Transportation needs:    Medical: Not on file    Non-medical: Not on  file  Tobacco Use  . Smoking status: Former Smoker    Packs/day: 0.50    Years: 20.00    Pack years: 10.00    Last attempt to quit: 07/30/2004    Years since quitting: 13.3  . Smokeless tobacco: Never Used  Substance and Sexual Activity  . Alcohol use: Yes    Alcohol/week: 0.0 standard drinks    Comment: Occasional  . Drug use: No  . Sexual activity: Yes    Partners: Male  Lifestyle  . Physical activity:    Days per week: 4 days    Minutes per session: 30 min  . Stress: Not at all  Relationships  . Social connections:    Talks on phone: Not on file    Gets together: Not on file    Attends  religious service: Not on file    Active member of club or organization: Not on file    Attends meetings of clubs or organizations: Not on file    Relationship status: Not on file  . Intimate partner violence:    Fear of current or ex partner: Not on file    Emotionally abused: Not on file    Physically abused: Not on file    Forced sexual activity: Not on file  Other Topics Concern  . Not on file  Social History Narrative  . Not on file    Family History  Problem Relation Age of Onset  . Heart disease Mother        CHF  . Diabetes Mother   . Hypertension Mother   . Cancer Father   . Hypertension Sister   . Pancreatitis Sister   . Dementia Sister     ROS: no fevers or chills, productive cough, hemoptysis, dysphasia, odynophagia, melena, hematochezia, dysuria, hematuria, rash, seizure activity, orthopnea, PND, pedal edema, claudication. Remaining systems are negative.  Physical Exam: Well-developed well-nourished in no acute distress.  Skin is warm and dry.  HEENT is normal.  Neck is supple.  Chest is clear to auscultation with normal expansion.  Cardiovascular exam is regular rate and rhythm.  Abdominal exam nontender or distended. No masses palpated. Extremities show no edema. neuro grossly intact   A/P  1 coronary artery disease-patient doing well with no chest pain.  Plan to continue medical therapy with aspirin, Plavix and statin.  Discontinue Plavix January 2020.  2 hyperlipidemia-continue statin.  3 hypertension-blood pressure is controlled today.  Continue present medications.  4 history of mitral valve prolapse-not evident on most recent echocardiogram.  5 history of orthostatic hypotension-no recent symptoms.  [name], MD

## 2017-11-22 ENCOUNTER — Ambulatory Visit (INDEPENDENT_AMBULATORY_CARE_PROVIDER_SITE_OTHER): Payer: Medicare Other | Admitting: Cardiology

## 2017-11-22 ENCOUNTER — Encounter: Payer: Self-pay | Admitting: Cardiology

## 2017-11-22 VITALS — BP 120/64 | HR 59 | Ht 62.0 in | Wt 175.0 lb

## 2017-11-22 DIAGNOSIS — I2510 Atherosclerotic heart disease of native coronary artery without angina pectoris: Secondary | ICD-10-CM

## 2017-11-22 DIAGNOSIS — E7800 Pure hypercholesterolemia, unspecified: Secondary | ICD-10-CM

## 2017-11-22 DIAGNOSIS — I10 Essential (primary) hypertension: Secondary | ICD-10-CM | POA: Diagnosis not present

## 2017-11-22 NOTE — Patient Instructions (Signed)
Medication Instructions:   STOP PLAVIX AT THE END OF DECEMBER  Follow-Up:  Your physician wants you to follow-up in: [place] will receive a reminder letter in the mail two months in advance. If you don't receive a letter, please call our office to schedule the follow-up appointment.   If you need a refill on your cardiac medications before your next appointment, please call your pharmacy.

## 2017-11-24 ENCOUNTER — Other Ambulatory Visit: Payer: Self-pay | Admitting: Family Medicine

## 2017-12-14 ENCOUNTER — Other Ambulatory Visit: Payer: Self-pay | Admitting: Family Medicine

## 2017-12-28 ENCOUNTER — Telehealth: Payer: Self-pay | Admitting: Cardiology

## 2017-12-28 ENCOUNTER — Telehealth: Payer: Self-pay

## 2017-12-28 NOTE — Telephone Encounter (Signed)
Left message for pt to call back  °

## 2017-12-28 NOTE — Telephone Encounter (Signed)
New message:       Pt states she has been experiencing some light CP since her stent. She states she is having some dizzy and tingling in fingers. She states her home RN took her O2 and states it was little low

## 2017-12-28 NOTE — Telephone Encounter (Signed)
Pt called nurse line stating she probably should call her cardiologist, but will try [place] first. Pt stated she has been having some light chest pain with dizziness. I advise patient she might need to be evaluated at the emergency room, but to call her cardiologist now. Pt voiced understanding.

## 2017-12-29 NOTE — Telephone Encounter (Signed)
Per pt had spoke with someone yesterday and has appt with Dr Stanford Breed for tom at 10:40 am in the meantime pt is just resting with no complaints at this time ./cy

## 2017-12-30 ENCOUNTER — Ambulatory Visit: Payer: Medicare Other | Admitting: Cardiology

## 2017-12-30 ENCOUNTER — Encounter: Payer: Self-pay | Admitting: Cardiology

## 2017-12-30 VITALS — BP 128/62 | HR 60 | Ht 62.0 in | Wt 175.0 lb

## 2017-12-30 DIAGNOSIS — R072 Precordial pain: Secondary | ICD-10-CM | POA: Diagnosis not present

## 2017-12-30 DIAGNOSIS — I2510 Atherosclerotic heart disease of native coronary artery without angina pectoris: Secondary | ICD-10-CM | POA: Diagnosis not present

## 2017-12-30 DIAGNOSIS — I10 Essential (primary) hypertension: Secondary | ICD-10-CM

## 2017-12-30 DIAGNOSIS — E7800 Pure hypercholesterolemia, unspecified: Secondary | ICD-10-CM

## 2017-12-30 NOTE — Progress Notes (Signed)
HPI: [name] [name].AlsoH/O MVPand orthostasis. Carotid Dopplers 2010 cshowed no significant stenosis.Echocardiogram repeated 9/16 and showed normal LV function.Patient had chest pain in November followed by abnormal nuclear study.Patient had cardiac catheterization December 2018 that showed an 80% ostial ramus and ejection fraction 50-55%. Patient had PCI with drug-eluting stent.Seen with atypical chest pain January 2019 felt not to be cardiac. Since last seen,she has a tingling pain in her left breast and lateral left chest area occasionally.  Last seconds and resolve spontaneously.  Not pleuritic, positional or exertional.  No increased dyspnea on exertion, orthopnea, PND or pedal edema.  No syncope.  Current Outpatient Medications  Medication Sig Dispense Refill  . amLODipine (NORVASC) 5 MG tablet Take 1 tablet (5 mg total) by mouth daily. 90 tablet 3  . aspirin 81 MG tablet Take 81 mg by mouth daily.    [name] atorvastatin (LIPITOR) 40 MG tablet TAKE 1 TABLET BY MOUTH EVERY DAY 30 tablet 11  . cyclobenzaprine (FLEXERIL) 10 MG tablet TAKE 10 MG BY MOUTH AT BEDTIME FOR BACK PAIN AND AS SLEEP AID 30 tablet 1  . ferrous sulfate 325 (65 FE) MG tablet TAKE 1 TABLET BY MOUTH EVERY DAY WITH BREAKFAST 90 tablet 1  . fluticasone (FLONASE) 50 MCG/ACT nasal spray Place 1 spray daily into both nostrils.    . Melatonin 3 MG TABS Take 1 tablet (3 mg total) by mouth at bedtime. 30 tablet 0  . metoprolol tartrate (LOPRESSOR) 25 MG tablet Take 12.5 mg by mouth 2 (two) times daily.    . pantoprazole (PROTONIX) 40 MG tablet Take 1 tablet (40 mg total) by mouth 2 (two) times daily. 180 tablet 3  . sertraline (ZOLOFT) 100 MG tablet TAKE 1 TABLET BY MOUTH EVERY DAY 90 tablet 2  . nitroGLYCERIN (NITROSTAT) 0.4 MG SL tablet Place 1 tablet (0.4 mg total) under the tongue every 5 (five) minutes as needed for chest pain. 25 tablet 5   No current facility-administered medications for this visit.      Past Medical  History:  Diagnosis Date  . Coronary artery disease   . Depression   . Hyperlipidemia   . Hypertension   . Microcytic anemia   . Mitral valve prolapse     Past Surgical History:  Procedure Laterality Date  . CARDIAC CATHETERIZATION    . CORONARY STENT INTERVENTION N/A 03/01/2017   Procedure: CORONARY STENT INTERVENTION;  Surgeon: Burnell Blanks, MD;  Location: [facility];  Service: Cardiovascular;  Laterality: N/A;  . INTRAVASCULAR PRESSURE WIRE/FFR STUDY N/A 03/01/2017   Procedure: INTRAVASCULAR PRESSURE WIRE/FFR STUDY;  Surgeon: Burnell Blanks, MD;  Location: [facility];  Service: Cardiovascular;  Laterality: N/A;  . LEFT HEART CATH AND CORONARY ANGIOGRAPHY N/A 03/01/2017   Procedure: LEFT HEART CATH AND CORONARY ANGIOGRAPHY;  Surgeon: Burnell Blanks, MD;  Location: [facility];  Service: Cardiovascular;  Laterality: N/A;  . PARTIAL HYSTERECTOMY      Social History   Socioeconomic History  . Marital status: Married    Spouse name: Not on file  . Number of children: 3  . Years of education: Not on file  . Highest education level: Not on file  Occupational History    Comment: Retired  Scientific laboratory technician  . Financial resource strain: Not on file  . Food insecurity:    Worry: Not on file    Inability: Not on file  . Transportation needs:    Medical: Not on file  Non-medical: Not on file  Tobacco Use  . Smoking status: Former Smoker    Packs/day: 0.50    Years: 20.00    Pack years: 10.00    Last attempt to quit: 07/30/2004    Years since quitting: 13.4  . Smokeless tobacco: Never Used  Substance and Sexual Activity  . Alcohol use: Yes    Alcohol/week: 0.0 standard drinks    Comment: Occasional  . Drug use: No  . Sexual activity: Yes    Partners: Male  Lifestyle  . Physical activity:    Days per week: 4 days    Minutes per session: 30 min  . Stress: Not at all  Relationships  . Social connections:    Talks on phone:  Not on file    Gets together: Not on file    Attends religious service: Not on file    Active member of club or organization: Not on file    Attends meetings of clubs or organizations: Not on file    Relationship status: Not on file  . Intimate partner violence:    Fear of current or ex partner: Not on file    Emotionally abused: Not on file    Physically abused: Not on file    Forced sexual activity: Not on file  Other Topics Concern  . Not on file  Social History Narrative  . Not on file    Family History  Problem Relation Age of Onset  . Heart disease Mother        CHF  . Diabetes Mother   . Hypertension Mother   . Cancer Father   . Hypertension Sister   . Pancreatitis Sister   . Dementia Sister     ROS: no fevers or chills, productive cough, hemoptysis, dysphasia, odynophagia, melena, hematochezia, dysuria, hematuria, rash, seizure activity, orthopnea, PND, pedal edema, claudication. Remaining systems are negative.  Physical Exam: Well-developed well-nourished in no acute distress.  Skin is warm and dry.  HEENT is normal.  Neck is supple.  Chest is clear to auscultation with normal expansion.  Cardiovascular exam is regular rate and rhythm.  1/6 systolic ejection murmur. Abdominal exam nontender or distended. No masses palpated. Extremities show no edema. neuro grossly intact  ECG-normal sinus rhythm at a rate of 60.  No ST changes.  Personally reviewed  A/P  1 coronary artery disease-patient has had brief chest pain that is extremely atypical and not likely cardiac.  EKG unchanged.  No plans for further ischemia evaluation at this point.  Continue medical therapy with aspirin and statin.  Continue Plavix but will discontinue January 2020.  2 hyperlipidemia-continue statin.  3 hypertension-patient's blood pressure is controlled today.  We will continue present medications and follow.  4 history of orthostatic hypotension  5 history of mitral valve  prolapse-not evident on most recent echocardiogram.  [name], MD

## 2017-12-30 NOTE — Patient Instructions (Signed)
Your physician wants you to follow-up in: 6 MONTHS WITH DR CRENSHAW You will receive a reminder letter in the mail two months in advance. If you don't receive a letter, please call our office to schedule the follow-up appointment.   If you need a refill on your cardiac medications before your next appointment, please call your pharmacy.  

## 2018-01-07 ENCOUNTER — Telehealth: Payer: Self-pay | Admitting: *Deleted

## 2018-01-07 NOTE — Telephone Encounter (Signed)
Pt states that she is having tension and neck pain from stress.  States that she was on a medication before, she thinks it was a muscle relaxer, and is wondering if she can get a refill.   Pt has flexeril as a historical med.   Will forward to MD .[name] [name], Salome Spotted, [place]

## 2018-01-10 ENCOUNTER — Other Ambulatory Visit: Payer: Self-pay | Admitting: Family Medicine

## 2018-01-10 NOTE — Progress Notes (Signed)
Muscle relaxers are contraindicated in this patient due to age, so I will speak with her regarding her pain.

## 2018-01-11 NOTE — Telephone Encounter (Signed)
Spoke with patient regarding her neck pain.  She says that she has used a heating pad and has gone to a yoga class with stretching, which was very helpful for her.  She says the pain comes and goes.  She is okay with not taking Flexeril after I explained to her that this medicine is dangerous and patients who are greater than 68 years of age.  She also had questions regarding which pain medications she should not take, and I told her that NSAIDs, including Aleve and ibuprofen, were not a good idea for her due to her kidney disease.  I did tell her that Tylenol was a safe medicine and that she could use that along with heating pads and stretching and massage to relieve her pain.  Patient was agreeable to this plan.

## 2018-01-19 ENCOUNTER — Other Ambulatory Visit: Payer: Self-pay | Admitting: Student

## 2018-01-19 NOTE — Telephone Encounter (Signed)
Dr. Stanford Breed is the primary cardiologist.

## 2018-01-31 ENCOUNTER — Encounter: Payer: Self-pay | Admitting: Family Medicine

## 2018-01-31 ENCOUNTER — Ambulatory Visit (INDEPENDENT_AMBULATORY_CARE_PROVIDER_SITE_OTHER): Payer: Medicare Other | Admitting: Family Medicine

## 2018-01-31 ENCOUNTER — Other Ambulatory Visit: Payer: Self-pay

## 2018-01-31 VITALS — BP 108/54 | HR 69 | Temp 98.5°F | Ht 62.0 in | Wt 180.6 lb

## 2018-01-31 DIAGNOSIS — G63 Polyneuropathy in diseases classified elsewhere: Secondary | ICD-10-CM | POA: Diagnosis not present

## 2018-01-31 DIAGNOSIS — G479 Sleep disorder, unspecified: Secondary | ICD-10-CM | POA: Insufficient documentation

## 2018-01-31 MED ORDER — GABAPENTIN 100 MG PO CAPS: 100.0000 mg | ORAL_CAPSULE | Freq: Every day | ORAL | 0 refills | Status: DC

## 2018-01-31 NOTE — Progress Notes (Signed)
Subjective:    [name] [name] - 67 y.o. female MRN [id number]  Date of birth: 02/01/1951  CC trouble sleeping  HPI  [name] [name] is here for sleep difficulties. -She has anxiety-filled, vivid dreams that are worse when she takes melatonin - tried taking Flexeril for sleep and has found it be helpful in reducing  - has tried changing the time she is going to bed, and going to bed earlier helps - sometimes takes about an hour to fall asleep - will have 1-2 nighttime awakenings  - has fatigue in the mornings and gets very sleepy in the afternoons and will often take a 30 minute nap - has a part time job in the evenings  - will often look at her phone or the TV if she cannot sleep  - will sometimes drink a mixed drink in the evenings - does not regularly drink caffeine - she will intermittently have shooting pain during the night in the dorsum of her right foot that eventually goes away and is not alleviated by position changes  Health Maintenance:  Health Maintenance Due  Topic Date Due  . FOOT EXAM  04/24/1959  . OPHTHALMOLOGY EXAM  04/24/1959  . URINE MICROALBUMIN  04/24/1959  . COLONOSCOPY  04/24/1999  . DEXA SCAN  04/23/2014  . MAMMOGRAM  02/14/2016  . HEMOGLOBIN A1C  01/21/2018    -  reports that she quit smoking about 13 years ago. She has a 10.00 pack-year smoking history. She has never used smokeless tobacco. - Review of Systems: Per HPI. - Past Medical History: Patient Active Problem List   Diagnosis Date Noted  . Neuropathy due to medical condition (HCC) 01/31/2018  . Difficulty sleeping 01/31/2018  . Unstable angina (HCC)   . Orthostatic hypotension 09/07/2016  . Right hip pain 08/19/2016  . Lumbar strain 04/27/2016  . Left knee pain 04/27/2016  . Finger pain, right 04/27/2016  . Neck pain 11/23/2015  . Low back pain 07/11/2015  . Right shoulder pain 04/25/2015  . GERD (gastroesophageal reflux disease) 01/28/2015  . Microcytic anemia 01/28/2015    . Near syncope 11/09/2014  . Leg cramps 11/15/2013  . Vitamin D deficiency 11/15/2013  . Prediabetes 03/01/2012  . Chest pain 08/18/2011  . Anxiety and depression 05/31/2009  . Mitral valve disorder 04/15/2009  . CARPAL TUNNEL SYNDROME 01/26/2008  . OBESITY 01/04/2008  . HYPERCHOLESTEROLEMIA 06/24/2006  . Essential hypertension 06/24/2006   - Medications: reviewed and updated   Objective:   Physical Exam BP (!) 108/54   Pulse 69   Temp 98.5 F (36.9 C) (Oral)   Ht 5\' 2"  (1.575 m)   Wt 180 lb 9.6 oz (81.9 kg)   SpO2 98%   BMI 33.03 kg/m  Gen: NAD, alert, cooperative with exam, well-appearing, pleasant CV: RRR, good S1/S2, no murmur, no edema, capillary refill brisk  Resp: CTABL, no wheezes, non-labored Abd: SNTND, BS present, no guarding or organomegaly Neuro: no gross deficits.  Psych: good insight, alert and oriented        Assessment & Plan:   Difficulty sleeping Patient has symptoms of sleep apnea due to her tendency to snore in her daytime fatigue, and she is obese, making this a more likely diagnosis.  A referral for sleep study was placed today.  She also has some behaviors that could make sleeping more difficult, so sleep hygiene was reviewed with the patient.  She was given handouts about sleep hygiene and sleep studies.  I would like to  exclude apnea and optimize sleep hygiene before considering a medication for sleep.  She was told that Flexeril is contraindicated in patients her age and advised not to take this medication.  Neuropathy due to medical condition (HCC) Will try gabapentin 100 mg nightly for possible neuropathic pain in her right foot.  This may also help with her sleep.    Lezlie Octave, M.D. 01/31/2018, 4:05 PM PGY-2, [facility]

## 2018-01-31 NOTE — Patient Instructions (Addendum)
It was nice seeing you today Ms. [name] [name]!  Please read below about sleep studies and sleep hygiene.  I am sending a referral to get a sleep study done for you to make sure you do not have sleep apnea.  If you have any questions or concerns, please feel free to call the clinic.   Be well,  Dr. Shan Levans  Sleep Studies A sleep study (polysomnogram) is a series of tests done while you are sleeping. It can show how well you sleep. This can help your health care provider diagnose a sleep disorder and show how severe your sleep disorder is. A sleep study may lead to treatment that will help you sleep better and prevent other medical problems caused by poor sleep. If you have a sleep disorder, you may also be at risk for:  Sleep-related accidents.  High blood pressure.  Heart disease.  Stroke.  Other medical conditions.  Sleep disorders are common. Your health care provider may suspect a sleep disorder if you:  Have loud snoring most nights.  Have brief periods when you stop breathing at night.  Feel sleepy on most days.  Fall asleep suddenly during the day.  Have trouble falling asleep or staying asleep.  Feel like you need to move your legs when trying to fall asleep.  Have dreams that seem very real shortly after falling asleep.  Feel like you cannot move when you first wake up.  Which tests will I need to have? Most sleep studies last all night and include these tests:  Recordings of your brain activity.  Recordings of your eye movements.  Recording of your heart rate and rhythm.  Blood pressure readings.  Readings of the amount of oxygen in your blood.  Measurements of your chest and belly movement as you breathe during sleep.  If you have signs of the sleep disorder called sleep apnea during your test, you may get a mask to wear for the second half of the night.  The mask provides continuous positive airway pressure (CPAP). This may improve sleep apnea  significantly.  You will then have all tests done again with the mask in place to see if your measurements and recordings change.  How are sleep studies done? Most sleep studies are done over one full night of sleep.  You will arrive at the study center in the evening and can go home in the morning.  Bring your pajamas and toothbrush.  Do not have caffeine on the day of your sleep study.  Your health care provider will let you know if you need to stop taking any of your regular medicines before the test.  To do the tests included in a polysomnogram, you will have:  Round, sticky patches with sensors attached to recording wires (electrodes) placed on your scalp, face, chest, and limbs.  Wires from all the electrodes and sensors run from your bed to a computer. The wires can be taken off and put back on if you need to get out of bed to go to the bathroom.  A sensor placed over your nose to measure airflow.  A finger clip put on one finger to measure your blood oxygen level.  A belt around your belly and a belt around your chest to measure breathing movements.  Where are sleep studies done? Sleep studies are done at sleep centers. A sleep center may be inside a hospital, office, or clinic. The room where you have the study may look like a hospital room  or a hotel room. The health care providers doing the study may come in and out of the room during the study. Most of the time, they will be in another room monitoring your test. How is information from sleep studies helpful? A polysomnogram can be used along with your medical history and a physical exam to diagnose conditions, such as:  Sleep apnea.  Restless legs syndrome.  Sleep-related seizure disorders.  Sleep-related movement disorders.  A medical doctor who specializes in sleep will evaluate your sleep study. The specialist will share the results with your primary health care provider. Treatments based on your sleep study  may include:  Improving your sleep habits (sleep hygiene).  Wearing a CPAP mask.  Wearing an oral device at night to improve breathing and reduce snoring.  Taking medicine for: ? Restless legs syndrome. ? Sleep-related seizure disorder. ? Sleep-related movement disorder.  This information is not intended to replace advice given to you by your health care provider. Make sure you discuss any questions you have with your health care provider. Document Released: 09/20/2002 Document Revised: 11/10/2015 Document Reviewed: 05/22/2013 Elsevier Interactive Patient Education  2018 [employer]. Insomnia Insomnia is a sleep disorder that makes it difficult to fall asleep or to stay asleep. Insomnia can cause tiredness (fatigue), low energy, difficulty concentrating, mood swings, and poor performance at work or school. There are three different ways to classify insomnia:  Difficulty falling asleep.  Difficulty staying asleep.  Waking up too early in the morning.  Any type of insomnia can be long-term (chronic) or short-term (acute). Both are common. Short-term insomnia usually lasts for three months or less. Chronic insomnia occurs at least three times a week for longer than three months. What are the causes? Insomnia may be caused by another condition, situation, or substance, such as:  Anxiety.  Certain medicines.  Gastroesophageal reflux disease (GERD) or other gastrointestinal conditions.  Asthma or other breathing conditions.  Restless legs syndrome, sleep apnea, or other sleep disorders.  Chronic pain.  Menopause. This may include hot flashes.  Stroke.  Abuse of alcohol, tobacco, or illegal drugs.  Depression.  Caffeine.  Neurological disorders, such as Alzheimer disease.  An overactive thyroid (hyperthyroidism).  The cause of insomnia may not be known. What increases the risk? Risk factors for insomnia include:  Gender. Women are more commonly affected than  men.  Age. Insomnia is more common as you get older.  Stress. This may involve your professional or personal life.  Income. Insomnia is more common in people with lower income.  Lack of exercise.  Irregular work schedule or night shifts.  Traveling between different time zones.  What are the signs or symptoms? If you have insomnia, trouble falling asleep or trouble staying asleep is the main symptom. This may lead to other symptoms, such as:  Feeling fatigued.  Feeling nervous about going to sleep.  Not feeling rested in the morning.  Having trouble concentrating.  Feeling irritable, anxious, or depressed.  How is this treated? Treatment for insomnia depends on the cause. If your insomnia is caused by an underlying condition, treatment will focus on addressing the condition. Treatment may also include:  Medicines to help you sleep.  Counseling or therapy.  Lifestyle adjustments.  Follow these instructions at home:  Take medicines only as directed by your health care provider.  Keep regular sleeping and waking hours. Avoid naps.  Keep a sleep diary to help you and your health care provider figure out what could be causing  your insomnia. Include: ? When you sleep. ? When you wake up during the night. ? How well you sleep. ? How rested you feel the next day. ? Any side effects of medicines you are taking. ? What you eat and drink.  Make your bedroom a comfortable place where it is easy to fall asleep: ? Put up shades or special blackout curtains to block light from outside. ? Use a white noise machine to block noise. ? Keep the temperature cool.  Exercise regularly as directed by your health care provider. Avoid exercising right before bedtime.  Use relaxation techniques to manage stress. Ask your health care provider to suggest some techniques that may work well for you. These may include: ? Breathing exercises. ? Routines to release muscle  tension. ? Visualizing peaceful scenes.  Cut back on alcohol, caffeinated beverages, and cigarettes, especially close to bedtime. These can disrupt your sleep.  Do not overeat or eat spicy foods right before bedtime. This can lead to digestive discomfort that can make it hard for you to sleep.  Limit screen use before bedtime. This includes: ? Watching TV. ? Using your smartphone, tablet, and computer.  Stick to a routine. This can help you fall asleep faster. Try to do a quiet activity, brush your teeth, and go to bed at the same time each night.  Get out of bed if you are still awake after 15 minutes of trying to sleep. Keep the lights down, but try reading or doing a quiet activity. When you feel sleepy, go back to bed.  Make sure that you drive carefully. Avoid driving if you feel very sleepy.  Keep all follow-up appointments as directed by your health care provider. This is important. Contact a health care provider if:  You are tired throughout the day or have trouble in your daily routine due to sleepiness.  You continue to have sleep problems or your sleep problems get worse. Get help right away if:  You have serious thoughts about hurting yourself or someone else. This information is not intended to replace advice given to you by your health care provider. Make sure you discuss any questions you have with your health care provider. Document Released: 03/13/2000 Document Revised: 08/16/2015 Document Reviewed: 12/15/2013 Elsevier Interactive Patient Education  [employer].

## 2018-01-31 NOTE — Assessment & Plan Note (Signed)
Will try gabapentin 100 mg nightly for possible neuropathic pain in her right foot.  This may also help with her sleep.

## 2018-01-31 NOTE — Addendum Note (Signed)
Addended by: Owens Shark, Hank Walling on: 01/31/2018 07:13 PM   Modules accepted: Level of Service

## 2018-01-31 NOTE — Assessment & Plan Note (Signed)
Patient has symptoms of sleep apnea due to her tendency to snore in her daytime fatigue, and she is obese, making this a more likely diagnosis.  A referral for sleep study was placed today.  She also has some behaviors that could make sleeping more difficult, so sleep hygiene was reviewed with the patient.  She was given handouts about sleep hygiene and sleep studies.  I would like to exclude apnea and optimize sleep hygiene before considering a medication for sleep.  She was told that Flexeril is contraindicated in patients her age and advised not to take this medication.

## 2018-03-09 ENCOUNTER — Other Ambulatory Visit: Payer: Self-pay | Admitting: Family Medicine

## 2018-04-11 ENCOUNTER — Telehealth: Payer: Self-pay

## 2018-04-11 ENCOUNTER — Institutional Professional Consult (permissible substitution): Payer: Self-pay | Admitting: Neurology

## 2018-04-11 NOTE — Telephone Encounter (Signed)
Pt did not show for their appt with Dr. Athar today.  

## 2018-04-12 ENCOUNTER — Encounter: Payer: Self-pay | Admitting: Neurology

## 2018-04-24 ENCOUNTER — Other Ambulatory Visit: Payer: Self-pay | Admitting: Family Medicine

## 2018-05-02 ENCOUNTER — Telehealth: Payer: Self-pay | Admitting: Cardiology

## 2018-05-02 NOTE — Telephone Encounter (Signed)
Called patient back she states at last office visit, she was told to stop a medication in January but could not remember which one. Patient was advised of Dr.Crenshaws note: Continue medical therapy with aspirin and statin.  Continue Plavix but will discontinue January 2020. Patient verified. Will route to MD and nurse to have clarify.

## 2018-05-02 NOTE — Telephone Encounter (Signed)
New Message   PTs calling because she said Dr Stanford Breed advised her to stop taking a medication but she cant remember the name of the medication Please call

## 2018-05-03 NOTE — Telephone Encounter (Signed)
DC plavix [name]

## 2018-05-03 NOTE — Telephone Encounter (Signed)
Spoke with pt, Aware of dr crenshaw's recommendations.  °

## 2018-05-18 ENCOUNTER — Other Ambulatory Visit: Payer: Self-pay | Admitting: Family Medicine

## 2018-06-07 ENCOUNTER — Ambulatory Visit: Payer: Medicare Other | Admitting: Family Medicine

## 2018-06-08 ENCOUNTER — Telehealth: Payer: Self-pay | Admitting: Cardiology

## 2018-06-08 NOTE — Telephone Encounter (Signed)
Called, LVM advising patient of message from PharmD. Patient was advised to call back if questions.

## 2018-06-08 NOTE — Telephone Encounter (Signed)
New Message            Patient is calling in to see if it is ok to take "Motrin" a dose of 600. Pls call and advise

## 2018-06-08 NOTE — Telephone Encounter (Signed)
Please advise 

## 2018-06-08 NOTE — Telephone Encounter (Signed)
We discouraged chronic use of any Ibuprofen, naproxen or any NSAID's   Okay to take 1 or 2 doses for acute pain. Avoid use for more 3 days. Always take after snack or meal.

## 2018-07-05 ENCOUNTER — Other Ambulatory Visit: Payer: Self-pay | Admitting: Family Medicine

## 2018-07-18 ENCOUNTER — Other Ambulatory Visit: Payer: Self-pay | Admitting: *Deleted

## 2018-07-18 MED ORDER — GABAPENTIN 100 MG PO CAPS: 100.0000 mg | ORAL_CAPSULE | Freq: Every day | ORAL | 0 refills | Status: DC

## 2018-07-28 ENCOUNTER — Other Ambulatory Visit: Payer: Self-pay | Admitting: Cardiology

## 2018-07-29 ENCOUNTER — Other Ambulatory Visit: Payer: Self-pay

## 2018-07-29 MED ORDER — SERTRALINE HCL 100 MG PO TABS: 100.0000 mg | ORAL_TABLET | Freq: Every day | ORAL | 0 refills | Status: DC

## 2018-07-29 NOTE — Telephone Encounter (Signed)
Amlodipine and protonix refilled.

## 2018-08-09 ENCOUNTER — Encounter: Payer: Self-pay | Admitting: Family Medicine

## 2018-08-09 ENCOUNTER — Ambulatory Visit (INDEPENDENT_AMBULATORY_CARE_PROVIDER_SITE_OTHER): Payer: Medicare Other | Admitting: Family Medicine

## 2018-08-09 ENCOUNTER — Other Ambulatory Visit: Payer: Self-pay

## 2018-08-09 VITALS — BP 112/58 | HR 61

## 2018-08-09 DIAGNOSIS — M79671 Pain in right foot: Secondary | ICD-10-CM

## 2018-08-09 DIAGNOSIS — R7303 Prediabetes: Secondary | ICD-10-CM

## 2018-08-09 DIAGNOSIS — G8929 Other chronic pain: Secondary | ICD-10-CM

## 2018-08-09 DIAGNOSIS — M545 Low back pain, unspecified: Secondary | ICD-10-CM

## 2018-08-09 DIAGNOSIS — M79672 Pain in left foot: Secondary | ICD-10-CM

## 2018-08-09 LAB — POCT GLYCOSYLATED HEMOGLOBIN (HGB A1C): HbA1c, POC (controlled diabetic range): 5.8 % (ref 0.0–7.0)

## 2018-08-09 MED ORDER — CYCLOBENZAPRINE HCL 10 MG PO TABS: 10.0000 mg | ORAL_TABLET | Freq: Every day | ORAL | 1 refills | Status: DC | PRN

## 2018-08-09 MED ORDER — GABAPENTIN 100 MG PO CAPS: 200.0000 mg | ORAL_CAPSULE | Freq: Every day | ORAL | 0 refills | Status: DC

## 2018-08-09 NOTE — Patient Instructions (Addendum)
It was nice seeing you today Ms. Stepka!  For your back pain, please take Tylenol up to 4 times per day and you can add Flexeril as needed on top of that.  I have sent a referral into physical therapy today, but in the meantime, I am sending you some back stretches and strengthening exercises that you can do as many times as you like at home.  This will likely help your back pain more than any medication well.  For your heel pain, we can increase the gabapentin to 2 pills at night, and you can experiment with taking this medication during the day if you would like and if you are sitting at home in case you get sleepy.  I am happy to refer you to a podiatrist in the future if you would like.  Your A1c is not in the diabetic range, which is great.  If you have any questions or concerns, please feel free to call the clinic.   Be well,  Dr. Shan Levans  Back Exercises The following exercises strengthen the muscles that help to support the back. They also help to keep the lower back flexible. Doing these exercises can help to prevent back pain or lessen existing pain. If you have back pain or discomfort, try doing these exercises 2-3 times each day or as told by your health care provider. When the pain goes away, do them once each day, but increase the number of times that you repeat the steps for each exercise (do more repetitions). If you do not have back pain or discomfort, do these exercises once each day or as told by your health care provider. Exercises Single Knee to Chest Repeat these steps 3-5 times for each leg: 1. Lie on your back on a firm bed or the floor with your legs extended. 2. Bring one knee to your chest. Your other leg should stay extended and in contact with the floor. 3. Hold your knee in place by grabbing your knee or thigh. 4. Pull on your knee until you feel a gentle stretch in your lower back. 5. Hold the stretch for 10-30 seconds. 6. Slowly release and straighten your  leg. Pelvic Tilt Repeat these steps 5-10 times: 1. Lie on your back on a firm bed or the floor with your legs extended. 2. Bend your knees so they are pointing toward the ceiling and your feet are flat on the floor. 3. Tighten your lower abdominal muscles to press your lower back against the floor. This motion will tilt your pelvis so your tailbone points up toward the ceiling instead of pointing to your feet or the floor. 4. With gentle tension and even breathing, hold this position for 5-10 seconds. Cat-Cow Repeat these steps until your lower back becomes more flexible: 1. Get into a hands-and-knees position on a firm surface. Keep your hands under your shoulders, and keep your knees under your hips. You may place padding under your knees for comfort. 2. Let your head hang down, and point your tailbone toward the floor so your lower back becomes rounded like the back of a cat. 3. Hold this position for 5 seconds. 4. Slowly lift your head and point your tailbone up toward the ceiling so your back forms a sagging arch like the back of a cow. 5. Hold this position for 5 seconds.  Press-Ups Repeat these steps 5-10 times: 1. Lie on your abdomen (face-down) on the floor. 2. Place your palms near your head, about shoulder-width  apart. 3. While you keep your back as relaxed as possible and keep your hips on the floor, slowly straighten your arms to raise the top half of your body and lift your shoulders. Do not use your back muscles to raise your upper torso. You may adjust the placement of your hands to make yourself more comfortable. 4. Hold this position for 5 seconds while you keep your back relaxed. 5. Slowly return to lying flat on the floor.  Bridges Repeat these steps 10 times: 1. Lie on your back on a firm surface. 2. Bend your knees so they are pointing toward the ceiling and your feet are flat on the floor. 3. Tighten your buttocks muscles and lift your buttocks off of the floor until  your waist is at almost the same height as your knees. You should feel the muscles working in your buttocks and the back of your thighs. If you do not feel these muscles, slide your feet 1-2 inches farther away from your buttocks. 4. Hold this position for 3-5 seconds. 5. Slowly lower your hips to the starting position, and allow your buttocks muscles to relax completely. If this exercise is too easy, try doing it with your arms crossed over your chest. Abdominal Crunches Repeat these steps 5-10 times: 1. Lie on your back on a firm bed or the floor with your legs extended. 2. Bend your knees so they are pointing toward the ceiling and your feet are flat on the floor. 3. Cross your arms over your chest. 4. Tip your chin slightly toward your chest without bending your neck. 5. Tighten your abdominal muscles and slowly raise your trunk (torso) high enough to lift your shoulder blades a tiny bit off of the floor. Avoid raising your torso higher than that, because it can put too much stress on your low back and it does not help to strengthen your abdominal muscles. 6. Slowly return to your starting position. Back Lifts Repeat these steps 5-10 times: 1. Lie on your abdomen (face-down) with your arms at your sides, and rest your forehead on the floor. 2. Tighten the muscles in your legs and your buttocks. 3. Slowly lift your chest off of the floor while you keep your hips pressed to the floor. Keep the back of your head in line with the curve in your back. Your eyes should be looking at the floor. 4. Hold this position for 3-5 seconds. 5. Slowly return to your starting position. Contact a health care provider if:  Your back pain or discomfort gets much worse when you do an exercise.  Your back pain or discomfort does not lessen within 2 hours after you exercise. If you have any of these problems, stop doing these exercises right away. Do not do them again unless your health care provider says that  you can. Get help right away if:  You develop sudden, severe back pain. If this happens, stop doing the exercises right away. Do not do them again unless your health care provider says that you can. This information is not intended to replace advice given to you by your health care provider. Make sure you discuss any questions you have with your health care provider. Document Released: 04/23/2004 Document Revised: 07/20/2017 Document Reviewed: 05/10/2014 Elsevier Interactive Patient Education  [employer].

## 2018-08-09 NOTE — Progress Notes (Signed)
Subjective:    NYIA CORNIA - 68 y.o. female MRN [id number]  Date of birth: 08-26-1950  CC:  [name] [name] is here for back pain and heel pain.  HPI: Back pain - chronicitiy: started a couple of months ago - location: bilateral lower back, although R>L - quality: aching - exacerbating factors: standing up from a seated position, worse in the evening - alleviating factors: sitting, motrin, but was told by her cardiologist not to take this, has also tried tylenol but does not know what medicines are safe to take - red flag symptoms: no saddle anesthesia, no incontinence, no unintentional weight loss, no night pain Would like to try Flexeril to see if this will help and was told by her pharmacist that she needed to be seen prior to her getting a refill of this medication.  She has used Flexeril and gabapentin for various pains in her feet.    Heel pain She currently has pain in both of her heels that inhibit her sleep, right greater than left.  She has tried various types of shoes and insoles to alleviate her heel pain.  Her heels hurt when she is walking and when she lies down.  She walks on concrete floors during her part time job at [place] [employer].   Health Maintenance:  Health Maintenance Due  Topic Date Due  . FOOT EXAM  04/24/1959  . OPHTHALMOLOGY EXAM  04/24/1959  . URINE MICROALBUMIN  04/24/1959  . COLONOSCOPY  04/24/1999  . DEXA SCAN  04/23/2014  . MAMMOGRAM  02/14/2016    -  reports that she quit smoking about 14 years ago. She has a 10.00 pack-year smoking history. She has never used smokeless tobacco. - Review of Systems: Per HPI. - Past Medical History: Patient Active Problem List   Diagnosis Date Noted  . Neuropathy due to medical condition (HCC) 01/31/2018  . Difficulty sleeping 01/31/2018  . Unstable angina (HCC)   . Heel pain, bilateral 09/07/2016  . Orthostatic hypotension 09/07/2016  . Right hip pain 08/19/2016  . Lumbar strain 04/27/2016  .  Left knee pain 04/27/2016  . Finger pain, right 04/27/2016  . Neck pain 11/23/2015  . Low back pain 07/11/2015  . Right shoulder pain 04/25/2015  . GERD (gastroesophageal reflux disease) 01/28/2015  . Microcytic anemia 01/28/2015  . Near syncope 11/09/2014  . Leg cramps 11/15/2013  . Vitamin D deficiency 11/15/2013  . Prediabetes 03/01/2012  . Chest pain 08/18/2011  . Anxiety and depression 05/31/2009  . Mitral valve disorder 04/15/2009  . CARPAL TUNNEL SYNDROME 01/26/2008  . OBESITY 01/04/2008  . HYPERCHOLESTEROLEMIA 06/24/2006  . Essential hypertension 06/24/2006   - Medications: reviewed and updated   Objective:   Physical Exam BP (!) 112/58   Pulse 61   SpO2 97%  Gen: NAD, alert, cooperative with exam, well-appearing, pleasant HEENT: NCAT, clear conjunctiva, supple neck CV: RRR, good S1/S2, no murmur, no edema Resp: CTABL, no wheezes, non-labored Skin: no rashes, normal turgor  Neuro: no gross deficits, normal gait, normal strength and tone Back: Symmetrical, nontender to palpation of spine and paraspinal muscles, reduced range of motion in lower back rotation bilaterally and lateral flexion bilaterally, although range of motion exercises do not elicit pain.  Normal gait. Feet: Appear normal without deformity, heels are nontender to palpation        Assessment & Plan:   Low back pain Acute on chronic.  No red flag symptoms, and no concern for fracture.  Appears to be  due to osteoarthritis and inflexibility as well as possible weakness on exam.  Recommended that patient take Tylenol for pain and then add on Flexeril since she says that this medicine tends to work well for her.  Told her that Flexeril is not indicated in patients who are greater than 49 years old, but patient says that she really only takes this sparingly, which I believe given her very infrequent refills.  I have sent an ambulatory referral for physical therapy and gave her back stretches and exercises  that she can do at home until physical therapy is started.  Heel pain, bilateral This also appears to be a problem patient has had in the past as well.  I am not sure if this is due to her neuropathy or another cause.  Does not appear to be plantar fasciitis since it does not involve the rest of the plantar surface of her foot.  She was advised that she can increase the amount of gabapentin she takes to see if this will help, and perhaps physical therapy will also help with her foot pain.  I am amenable to referring her to podiatry if she requests in the future, and she will think about this.    Lezlie Octave, M.D. 08/10/2018, 1:24 PM PGY-2, [facility]

## 2018-08-10 NOTE — Assessment & Plan Note (Signed)
Acute on chronic.  No red flag symptoms, and no concern for fracture.  Appears to be due to osteoarthritis and inflexibility as well as possible weakness on exam.  Recommended that patient take Tylenol for pain and then add on Flexeril since she says that this medicine tends to work well for her.  Told her that Flexeril is not indicated in patients who are greater than 69 years old, but patient says that she really only takes this sparingly, which I believe given her very infrequent refills.  I have sent an ambulatory referral for physical therapy and gave her back stretches and exercises that she can do at home until physical therapy is started.

## 2018-08-10 NOTE — Assessment & Plan Note (Signed)
This also appears to be a problem patient has had in the past as well.  I am not sure if this is due to her neuropathy or another cause.  Does not appear to be plantar fasciitis since it does not involve the rest of the plantar surface of her foot.  She was advised that she can increase the amount of gabapentin she takes to see if this will help, and perhaps physical therapy will also help with her foot pain.  I am amenable to referring her to podiatry if she requests in the future, and she will think about this.

## 2018-08-11 ENCOUNTER — Other Ambulatory Visit: Payer: Self-pay

## 2018-08-11 MED ORDER — METOPROLOL TARTRATE 25 MG PO TABS: 12.5000 mg | ORAL_TABLET | Freq: Two times a day (BID) | ORAL | 6 refills | Status: DC

## 2018-08-29 ENCOUNTER — Ambulatory Visit: Payer: Medicare Other | Attending: Family Medicine | Admitting: Physical Therapy

## 2018-08-29 ENCOUNTER — Other Ambulatory Visit: Payer: Self-pay

## 2018-08-29 ENCOUNTER — Encounter: Payer: Self-pay | Admitting: Physical Therapy

## 2018-08-29 DIAGNOSIS — M79672 Pain in left foot: Secondary | ICD-10-CM | POA: Diagnosis present

## 2018-08-29 DIAGNOSIS — M545 Low back pain: Secondary | ICD-10-CM | POA: Insufficient documentation

## 2018-08-29 DIAGNOSIS — R262 Difficulty in walking, not elsewhere classified: Secondary | ICD-10-CM | POA: Insufficient documentation

## 2018-08-29 DIAGNOSIS — M79671 Pain in right foot: Secondary | ICD-10-CM | POA: Diagnosis present

## 2018-08-29 DIAGNOSIS — G8929 Other chronic pain: Secondary | ICD-10-CM | POA: Diagnosis present

## 2018-08-29 NOTE — Therapy (Signed)
Problem List   Diagnosis Date Noted  . Neuropathy due to medical condition ([place]) 01/31/2018  . Difficulty sleeping 01/31/2018  . Unstable angina ([place])   . Heel pain, bilateral 09/07/2016  . Orthostatic hypotension 09/07/2016  . Right hip pain 08/19/2016  . Lumbar strain 04/27/2016  . Left knee pain 04/27/2016  . Finger pain, right 04/27/2016  . Neck pain 11/23/2015  . Low back pain 07/11/2015  . Right shoulder pain 04/25/2015  . GERD (gastroesophageal reflux disease) 01/28/2015  . Microcytic anemia 01/28/2015  . Near syncope 11/09/2014  . Leg cramps  11/15/2013  . Vitamin D deficiency 11/15/2013  . Prediabetes 03/01/2012  . Chest pain 08/18/2011  . Anxiety and depression 05/31/2009  . Mitral valve disorder 04/15/2009  . CARPAL TUNNEL SYNDROME 01/26/2008  . OBESITY 01/04/2008  . HYPERCHOLESTEROLEMIA 06/24/2006  . Essential hypertension 06/24/2006    Silvestre Mesi 08/29/2018, 3:20 PM  [facility] [address] [place], [place], [address] Phone: [phone]   Fax:  [phone]  Name: [name] [name] MRN: [id number] Date of Birth: 1950/07/12  [place], [place], [address] Phone: [phone]   Fax:  [phone]  Physical Therapy Evaluation  Patient Details  Name: [name] [name] MRN: [id number] Date of Birth: [birth year] Referring Provider (PT):  Kathrene Alu, MD resident, McDiarmid, Blane Ohara, MD   Encounter Date: 08/29/2018  PT End of Session - 08/29/18 1504    Visit Number  1    Number of Visits  12    Date for PT Re-Evaluation  10/10/18    Authorization Type  [employer] MCR    PT Start Time  1300    PT Stop Time  1400    PT Time Calculation (min)  60 min    Activity Tolerance  Patient tolerated treatment well    Behavior During Therapy  [facility] for tasks assessed/performed       Past Medical History:  Diagnosis Date  . Coronary artery disease   . Depression   . Hyperlipidemia   . Hypertension   . Microcytic anemia   . Mitral valve prolapse     Past Surgical History:  Procedure Laterality Date  . CARDIAC CATHETERIZATION    . CORONARY STENT INTERVENTION N/A 03/01/2017   Procedure: CORONARY STENT INTERVENTION;  Surgeon: Burnell Blanks, MD;  Location: [facility];  Service: Cardiovascular;  Laterality: N/A;  . INTRAVASCULAR PRESSURE WIRE/FFR STUDY N/A 03/01/2017   Procedure: INTRAVASCULAR PRESSURE WIRE/FFR STUDY;  Surgeon: Burnell Blanks, MD;  Location: [facility];  Service: Cardiovascular;  Laterality: N/A;  . LEFT HEART CATH AND CORONARY ANGIOGRAPHY N/A 03/01/2017   Procedure: LEFT HEART CATH AND CORONARY ANGIOGRAPHY;  Surgeon: Burnell Blanks, MD;  Location: [facility];  Service: Cardiovascular;  Laterality: N/A;  . PARTIAL HYSTERECTOMY      There were no vitals filed for this visit.   Subjective Assessment - 08/29/18 1309    Subjective  Pt relays acute on chronic back pain along with bilat heel pain Rt>Lt that started for the last 2 months. She says pain is not worse in the morning but is  worse in the evening and espically with standing from sitting, or with prolonged walking or standing. 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 She denies any Numbness tingling or burning in her legs.     Pertinent History  MHD:[id number], sleep disorder, history of chest pain, CAD and mitral valve disorder, obesity, HTN    Limitations  Sitting;House hold activities;Lifting;Standing;Walking    How long can you stand comfortably?  depends can stand 2 hours but with pain    Diagnostic tests  no recent imaging    Patient Stated Goals  get the pain down with walking    Currently in Pain?  Yes    Pain Score  6     Pain Location  Back   and Rt heel   Pain Orientation  Right    Pain Descriptors / Indicators  Aching;Constant;Sharp    Pain Type  Other (Comment)   subacute   Pain Radiating Towards  denies but does have Rt heel pain    Pain Onset  More than a month ago    Pain Frequency  Intermittent    Aggravating Factors   Back pain worse with sit to stand, alieved some with sitting, Heel pain worse with standing and walking on concrete floors, works parttime at [employer]    Pain Relieving Factors  heat, TENS sometimes  Problem List   Diagnosis Date Noted  . Neuropathy due to medical condition ([place]) 01/31/2018  . Difficulty sleeping 01/31/2018  . Unstable angina ([facility])   . Heel pain, bilateral 09/07/2016  . Orthostatic hypotension 09/07/2016  . Right hip pain 08/19/2016  . Lumbar strain 04/27/2016  . Left knee pain 04/27/2016  . Finger pain, right 04/27/2016  . Neck pain 11/23/2015  . Low back pain 07/11/2015  . Right shoulder pain 04/25/2015  . GERD (gastroesophageal reflux disease) 01/28/2015  . Microcytic anemia 01/28/2015  . Near syncope 11/09/2014  . Leg cramps  11/15/2013  . Vitamin D deficiency 11/15/2013  . Prediabetes 03/01/2012  . Chest pain 08/18/2011  . Anxiety and depression 05/31/2009  . Mitral valve disorder 04/15/2009  . CARPAL TUNNEL SYNDROME 01/26/2008  . OBESITY 01/04/2008  . HYPERCHOLESTEROLEMIA 06/24/2006  . Essential hypertension 06/24/2006    Silvestre Mesi 08/29/2018, 3:20 PM  [facility] [address] [place], [place], [address] Phone: [phone]   Fax:  [phone]  Name: [name] [name] MRN: [id number] Date of Birth: 1950/07/12  Problem List   Diagnosis Date Noted  . Neuropathy due to medical condition ([place]) 01/31/2018  . Difficulty sleeping 01/31/2018  . Unstable angina ([facility])   . Heel pain, bilateral 09/07/2016  . Orthostatic hypotension 09/07/2016  . Right hip pain 08/19/2016  . Lumbar strain 04/27/2016  . Left knee pain 04/27/2016  . Finger pain, right 04/27/2016  . Neck pain 11/23/2015  . Low back pain 07/11/2015  . Right shoulder pain 04/25/2015  . GERD (gastroesophageal reflux disease) 01/28/2015  . Microcytic anemia 01/28/2015  . Near syncope 11/09/2014  . Leg cramps  11/15/2013  . Vitamin D deficiency 11/15/2013  . Prediabetes 03/01/2012  . Chest pain 08/18/2011  . Anxiety and depression 05/31/2009  . Mitral valve disorder 04/15/2009  . CARPAL TUNNEL SYNDROME 01/26/2008  . OBESITY 01/04/2008  . HYPERCHOLESTEROLEMIA 06/24/2006  . Essential hypertension 06/24/2006    Silvestre Mesi 08/29/2018, 3:20 PM  [facility] [address] [place], [place], [address] Phone: [phone]   Fax:  [phone]  Name: [name] [name] MRN: [id number] Date of Birth: 1950/07/12

## 2018-08-29 NOTE — Patient Instructions (Signed)
Access Code: [email or web address]  URL: [email or web address]  Date: 08/29/2018  Prepared by: Elsie Ra   Exercises  Supine Piriformis Stretch - 3 reps - 1 sets - 30 hold - 2x daily - 6x weekly  Supine Hamstring Stretch with Strap - 3 reps - 1 sets - 30 hold - 2x daily - 6x weekly  Supine Lower Trunk Rotation - 2-3 reps - 1 sets - 10 hold - 2x daily - 6x weekly  Supine Bridge - 10 reps - 2 sets - 5 hold - 2x daily - 6x weekly  Static Prone on Elbows - 3 sets - 60 sec hold - 2x daily - 6x weekly  Gastroc Stretch on Wall - 3 sets - 30 hold - 1x daily - 3x weekly   Recommendation for Tuli heel cups to try in her shoe

## 2018-09-05 ENCOUNTER — Ambulatory Visit: Payer: Medicare Other | Admitting: Physical Therapy

## 2018-09-07 ENCOUNTER — Ambulatory Visit: Payer: Medicare Other | Admitting: Physical Therapy

## 2018-09-07 ENCOUNTER — Other Ambulatory Visit: Payer: Self-pay | Admitting: *Deleted

## 2018-09-07 DIAGNOSIS — Z20822 Contact with and (suspected) exposure to covid-19: Secondary | ICD-10-CM

## 2018-09-08 LAB — NOVEL CORONAVIRUS, NAA: SARS-CoV-2, NAA: NOT DETECTED

## 2018-09-12 ENCOUNTER — Ambulatory Visit: Payer: Medicare Other | Admitting: Physical Therapy

## 2018-09-14 ENCOUNTER — Other Ambulatory Visit: Payer: Self-pay

## 2018-09-14 ENCOUNTER — Encounter: Payer: Self-pay | Admitting: Physical Therapy

## 2018-09-14 ENCOUNTER — Ambulatory Visit: Payer: Medicare Other | Admitting: Physical Therapy

## 2018-09-14 DIAGNOSIS — M545 Low back pain: Secondary | ICD-10-CM | POA: Diagnosis not present

## 2018-09-14 DIAGNOSIS — G8929 Other chronic pain: Secondary | ICD-10-CM

## 2018-09-14 DIAGNOSIS — R262 Difficulty in walking, not elsewhere classified: Secondary | ICD-10-CM

## 2018-09-14 DIAGNOSIS — M79672 Pain in left foot: Secondary | ICD-10-CM

## 2018-09-14 DIAGNOSIS — M79671 Pain in right foot: Secondary | ICD-10-CM

## 2018-09-14 NOTE — Therapy (Addendum)
[facility] [address] [place], [place], [address] Phone: [phone]   Fax:  [phone]  Physical Therapy Treatment  Patient Details  Name: [name] [name] MRN: [id number] Date of Birth: March 10, 1951 Referring Provider (PT):  Lennox Solders, MD resident, McDiarmid, Leighton Roach, MD   Encounter Date: 09/14/2018  PT End of Session - 09/14/18 1511    Visit Number  2    Number of Visits  12    Date for PT Re-Evaluation  10/10/18    PT Start Time  0306    PT Stop Time  0344    PT Time Calculation (min)  38 min       Past Medical History:  Diagnosis Date  . Coronary artery disease   . Depression   . Hyperlipidemia   . Hypertension   . Microcytic anemia   . Mitral valve prolapse     Past Surgical History:  Procedure Laterality Date  . CARDIAC CATHETERIZATION    . CORONARY STENT INTERVENTION N/A 03/01/2017   Procedure: CORONARY STENT INTERVENTION;  Surgeon: Kathleene Hazel, MD;  Location: [facility];  Service: Cardiovascular;  Laterality: N/A;  . INTRAVASCULAR PRESSURE WIRE/FFR STUDY N/A 03/01/2017   Procedure: INTRAVASCULAR PRESSURE WIRE/FFR STUDY;  Surgeon: Kathleene Hazel, MD;  Location: [facility];  Service: Cardiovascular;  Laterality: N/A;  . LEFT HEART CATH AND CORONARY ANGIOGRAPHY N/A 03/01/2017   Procedure: LEFT HEART CATH AND CORONARY ANGIOGRAPHY;  Surgeon: Kathleene Hazel, MD;  Location: [facility];  Service: Cardiovascular;  Laterality: N/A;  . PARTIAL HYSTERECTOMY      There were no vitals filed for this visit.  Subjective Assessment - 09/14/18 1509    Subjective  Pain is a little better. No back pain today. Knees hurt intermittently with standing after sitting. Heel is better after buying in soles and doing stretches.    Currently in Pain?  Yes    Pain Score  2     Pain Location  Hip    Pain Orientation  Right;Lateral    Pain Descriptors / Indicators  Aching     Aggravating Factors   getting up after sitting    Pain Relieving Factors  heat                       [facility] Adult PT Treatment/Exercise - 09/14/18 0001      Exercises   Exercises  Lumbar      Lumbar Exercises: Stretches   Active Hamstring Stretch  2 reps;30 seconds    Active Hamstring Stretch Limitations  seated    Lower Trunk Rotation  10 seconds    Lower Trunk Rotation Limitations  10 reps    Prone on Elbows Stretch  60 seconds    Quadruped Mid Back Stretch Limitations  childs pose x 1 and laterals x 1     Piriformis Stretch  30 seconds;2 reps    Gastroc Stretch Limitations  slant board and runners stretch bilateral      Lumbar Exercises: Aerobic   Nustep  L5 UE/LE x 5 minutes       Lumbar Exercises: Standing   Other Standing Lumbar Exercises  sit-stand x 10       Lumbar Exercises: Supine   Pelvic Tilt  10 reps    Bent Knee Raise  20 reps    Bent Knee Raise Limitations  cues for abdominal draw in     Irena  10 reps      Lumbar Exercises: Quadruped   Madcat/Old Horse  10 reps    Madcat/Old Horse Limitations  cues to keep shoulders depressed             PT Education - 09/14/18 1617    Education Details  HEP    Person(s) Educated  Patient    Methods  Explanation;Handout    Comprehension  Verbalized understanding          PT Long Term Goals - 08/29/18 1513      PT LONG TERM GOAL #1   Title  Pt will be I and compliant with HEP. (target for goals 6 weeks 10/10/18)    Status  New      PT LONG TERM GOAL #2   Title  Pt will improve ROM to [facility] >75% to improve mobility    Status  New      PT LONG TERM GOAL #3   Title  Pt will improve FOTO to less than 43% limited to show improved function    Status  New      PT LONG TERM GOAL #4   Title  Pt will reduce pain by overall 50% with usual work activity, and be able to stand up from chair without any pain.     Status  New            Plan - 09/14/18 1548    Clinical Impression Statement   pt reports shoe inserts and HEP have helped. She is performing intermittently. Reviewed HEP and progressed COre strength for HEP. Began quadruped stretching which she required mod cues for shoulder placement.    PT Next Visit Plan  review HEP, consider MT and modalaties for pain, needs lumbar, hip, and gastroc stretching and core strength    PT Home Exercise Plan  HSS, piriformis stretch, LTR, bridge, POE, PPT, supine march       Patient will benefit from skilled therapeutic intervention in order to improve the following deficits and impairments:  Abnormal gait, Decreased activity tolerance, Decreased endurance, Decreased range of motion, Decreased strength, Hypomobility, Difficulty walking, Impaired flexibility, Increased muscle spasms, Pain, Postural dysfunction, Improper body mechanics  Visit Diagnosis: 1. Chronic bilateral low back pain without sciatica   2. Pain in right foot   3. Pain in left foot   4. Difficulty in walking, not elsewhere classified        Problem List Patient Active Problem List   Diagnosis Date Noted  . Neuropathy due to medical condition (HCC) 01/31/2018  . Difficulty sleeping 01/31/2018  . Unstable angina ([facility])   . Heel pain, bilateral 09/07/2016  . Orthostatic hypotension 09/07/2016  . Right hip pain 08/19/2016  . Lumbar strain 04/27/2016  . Left knee pain 04/27/2016  . Finger pain, right 04/27/2016  . Neck pain 11/23/2015  . Low back pain 07/11/2015  . Right shoulder pain 04/25/2015  . GERD (gastroesophageal reflux disease) 01/28/2015  . Microcytic anemia 01/28/2015  . Near syncope 11/09/2014  . Leg cramps 11/15/2013  . Vitamin D deficiency 11/15/2013  . Prediabetes 03/01/2012  . Chest pain 08/18/2011  . Anxiety and depression 05/31/2009  . Mitral valve disorder 04/15/2009  . CARPAL TUNNEL SYNDROME 01/26/2008  . OBESITY 01/04/2008  . HYPERCHOLESTEROLEMIA 06/24/2006  . Essential hypertension 06/24/2006    Sherrie Mustache,  PTA 09/14/2018, 4:19 PM  [facility] [address] [place], [place], [address] Phone: [phone]   Fax:  [phone]  Name:  [name] [name] MRN: [id number] Date of Birth: 05-09-1950

## 2018-09-15 ENCOUNTER — Other Ambulatory Visit: Payer: Self-pay | Admitting: Family Medicine

## 2018-09-19 ENCOUNTER — Ambulatory Visit: Payer: Medicare Other | Admitting: Physical Therapy

## 2018-09-21 ENCOUNTER — Ambulatory Visit: Payer: Medicare Other | Admitting: Physical Therapy

## 2018-09-21 ENCOUNTER — Telehealth: Payer: Self-pay

## 2018-09-21 ENCOUNTER — Other Ambulatory Visit: Payer: Self-pay | Admitting: Family Medicine

## 2018-09-21 DIAGNOSIS — Z1231 Encounter for screening mammogram for malignant neoplasm of breast: Secondary | ICD-10-CM

## 2018-09-21 NOTE — Telephone Encounter (Signed)
Patient calls nurse line stating she has an apt for a mammogram in August, however needs a referral. The patient is also requesting a referral for a dexa scan. The patient is scheduled at [facility].

## 2018-09-23 ENCOUNTER — Other Ambulatory Visit: Payer: Self-pay | Admitting: Family Medicine

## 2018-09-23 DIAGNOSIS — Z780 Asymptomatic menopausal state: Secondary | ICD-10-CM

## 2018-09-23 DIAGNOSIS — Z1231 Encounter for screening mammogram for malignant neoplasm of breast: Secondary | ICD-10-CM

## 2018-09-23 NOTE — Telephone Encounter (Signed)
Referral for mammogram and DEXA scan sent.  Thanks!

## 2018-09-26 ENCOUNTER — Ambulatory Visit: Payer: Medicare Other | Admitting: Physical Therapy

## 2018-09-26 NOTE — Telephone Encounter (Signed)
Pt informed of below.[name] [name], CMA ? ?

## 2018-09-28 ENCOUNTER — Encounter: Payer: Medicare Other | Admitting: Physical Therapy

## 2018-10-06 ENCOUNTER — Other Ambulatory Visit: Payer: Self-pay | Admitting: Family Medicine

## 2018-10-07 ENCOUNTER — Ambulatory Visit: Payer: Medicare Other | Admitting: Family Medicine

## 2018-10-18 ENCOUNTER — Telehealth: Payer: Self-pay | Admitting: *Deleted

## 2018-10-18 ENCOUNTER — Other Ambulatory Visit: Payer: Self-pay | Admitting: Family Medicine

## 2018-10-18 DIAGNOSIS — S39012A Strain of muscle, fascia and tendon of lower back, initial encounter: Secondary | ICD-10-CM

## 2018-10-18 NOTE — Telephone Encounter (Signed)
Please let Ms. Brau know that I am happy to refer her to sports medicine, and I have sent the referral today.

## 2018-10-18 NOTE — Telephone Encounter (Signed)
Pt states that the gabapentin is not really helping.  She wonders if going to [facility] would help.  She would like to know what Dr. Shan Levans suggest.  Christen Bame, CMA

## 2018-10-19 NOTE — Telephone Encounter (Signed)
Pt informed of referral. Ottis Stain, [place]

## 2018-10-24 ENCOUNTER — Ambulatory Visit (INDEPENDENT_AMBULATORY_CARE_PROVIDER_SITE_OTHER): Payer: Medicare Other | Admitting: Family Medicine

## 2018-10-24 ENCOUNTER — Other Ambulatory Visit: Payer: Self-pay

## 2018-10-24 ENCOUNTER — Encounter: Payer: Self-pay | Admitting: Family Medicine

## 2018-10-24 ENCOUNTER — Ambulatory Visit
Admission: RE | Admit: 2018-10-24 | Discharge: 2018-10-24 | Disposition: A | Discharge status: Home / Self Care | Payer: Medicare Other | Source: Ambulatory Visit | Attending: Family Medicine | Admitting: Family Medicine

## 2018-10-24 ENCOUNTER — Other Ambulatory Visit: Payer: Self-pay | Admitting: *Deleted

## 2018-10-24 VITALS — BP 128/72 | Wt 172.0 lb

## 2018-10-24 DIAGNOSIS — G8929 Other chronic pain: Secondary | ICD-10-CM

## 2018-10-24 DIAGNOSIS — M722 Plantar fascial fibromatosis: Secondary | ICD-10-CM | POA: Diagnosis not present

## 2018-10-24 DIAGNOSIS — M545 Low back pain: Secondary | ICD-10-CM | POA: Diagnosis not present

## 2018-10-24 MED ORDER — METHYLPREDNISOLONE ACETATE 40 MG/ML IJ SUSP
40.0000 mg | Freq: Once | INTRAMUSCULAR | Status: AC
  Administered 2018-10-24: 40 mg via INTRA_ARTICULAR

## 2018-10-24 NOTE — Assessment & Plan Note (Addendum)
History and physical exam most consistent with plantar fasciitis. No improvement with conservative therapy thus far. Discussed treatment options including corticosteroid shot. Patient opted for this. Also recommended home stretching regimen and heel pads. Tylenol/Ibuprofen for pain. Can consider PT if no improvement. RTC 6 weeks, sooner if worsening or no improvement   Procedure performed: Left plantar fascia corticosteroid injection; US guided Consent obtained and verified. Time-out conducted. Noted no overlying erythema, induration, or other signs of local infection. The left medial calcaneous at site of plantar fascia was visualized with ultrasound. The over overlying skin was prepped prepped in a sterile fashion. Topical analgesic spray: Ethyl chloride. Needle: 25-gauge 5/8 inch Completed without difficulty. Meds: DepoMedrol 20 mg, lidocaine 0.5 cc  Advised to call if fevers/chills, erythema, induration, drainage, or persistent bleeding

## 2018-10-24 NOTE — Patient Instructions (Signed)
Get x-rays of your low back after you leave today. Your pain here is consistent with arthritis. I'd encourage you to restart physical therapy if the x-rays look ok. Do home exercises on days you don't go to therapy. Take tylenol 500mg  1-2 tabs three times a day as needed. Topical aspercreme or biofreeze up to 4 times a day. Salon pas patches may help with pain as well. Flexeril only as needed rarely if this provides benefit.  You have plantar fasciitis Take tylenol and/or aleve as needed for pain  Plantar fascia stretch for 20-30 seconds (do 3 of these) in morning Lowering/raise on a step exercises 3 x 10 once or twice a day - this is very important for long term recovery. Can add heel walks, toe walks forward and backward as well Ice heel for 15 minutes as needed. Avoid flat shoes/barefoot walking as much as possible. Arch straps have been shown to help with pain. Inserts are important (dr. Zoe Lan active series, spencos, our green insoles, custom orthotics). Steroid injection is a consideration for short term pain relief if you are struggling. Physical therapy is also an option. Follow up with me in 6 weeks.  Let your doctor know if you want [place] to see you for your finger and other issues to place another referral for those.

## 2018-10-24 NOTE — Assessment & Plan Note (Signed)
Point tenderness along midline L5-S1 area without radiculopathy. Suspect arthritic changes are contributing to her pain. Last images of her spine were in 2006. Will obtain repeat x-rays for better evaluation. Recommended conservative treatment at this time including physical therapy, Ibuprofen/tylenol and topical analgesics PRN for pain, and Flexeril as needed. RTC in 6 weeks, sooner if worsening or no improvement
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
## 2018-10-25 MED ORDER — ATORVASTATIN CALCIUM 40 MG PO TABS: 40.0000 mg | ORAL_TABLET | Freq: Every day | ORAL | 3 refills | Status: DC

## 2018-10-26 NOTE — Addendum Note (Signed)
Addended by: Cyd Silence on: 10/26/2018 12:15 PM   Modules accepted: Orders

## 2018-10-27 ENCOUNTER — Other Ambulatory Visit: Payer: Self-pay | Admitting: Family Medicine

## 2018-11-02 ENCOUNTER — Telehealth: Payer: Self-pay | Admitting: Family Medicine

## 2018-11-02 NOTE — Telephone Encounter (Signed)
Pt called and needs another referral for her heel. She is already being seen at [facility] for other things, but they need a referral for her heel as well. jw

## 2018-11-03 ENCOUNTER — Other Ambulatory Visit: Payer: Self-pay | Admitting: Family Medicine

## 2018-11-03 DIAGNOSIS — M79673 Pain in unspecified foot: Secondary | ICD-10-CM

## 2018-11-03 NOTE — Telephone Encounter (Signed)
Referral sent. Thanks.

## 2018-11-03 NOTE — Telephone Encounter (Signed)
Pt informed.[name] [name], CMA ° °

## 2018-11-04 ENCOUNTER — Other Ambulatory Visit: Payer: Self-pay

## 2018-11-04 ENCOUNTER — Ambulatory Visit
Admission: RE | Admit: 2018-11-04 | Discharge: 2018-11-04 | Disposition: A | Discharge status: Home / Self Care | Payer: Medicare Other | Source: Ambulatory Visit | Attending: *Deleted | Admitting: *Deleted

## 2018-11-04 DIAGNOSIS — Z1231 Encounter for screening mammogram for malignant neoplasm of breast: Secondary | ICD-10-CM

## 2018-11-09 ENCOUNTER — Telehealth (INDEPENDENT_AMBULATORY_CARE_PROVIDER_SITE_OTHER): Payer: Medicare Other | Admitting: Cardiology

## 2018-11-09 VITALS — BP 134/72 | HR 67 | Ht 62.0 in | Wt 170.0 lb

## 2018-11-09 DIAGNOSIS — E7800 Pure hypercholesterolemia, unspecified: Secondary | ICD-10-CM | POA: Diagnosis not present

## 2018-11-09 DIAGNOSIS — I059 Rheumatic mitral valve disease, unspecified: Secondary | ICD-10-CM | POA: Diagnosis not present

## 2018-11-09 DIAGNOSIS — I10 Essential (primary) hypertension: Secondary | ICD-10-CM | POA: Diagnosis not present

## 2018-11-09 DIAGNOSIS — I2510 Atherosclerotic heart disease of native coronary artery without angina pectoris: Secondary | ICD-10-CM

## 2018-11-09 NOTE — Progress Notes (Signed)
Virtual Visit via Video Note changed to phone visit at patient request   This visit type was conducted due to national recommendations for restrictions regarding the COVID-19 Pandemic (e.g. social distancing) in an effort to limit this patient's exposure and mitigate transmission in our community.  Due to her co-morbid illnesses, this patient is at least at moderate risk for complications without adequate follow up.  This format is felt to be most appropriate for this patient at this time.  All issues noted in this document were discussed and addressed.  A limited physical exam was performed with this format.  Please refer to the patient's chart for her consent to telehealth for [facility].   Date:  11/09/2018   ID:  [name] [name], DOB October 08, 1950, MRN [id number]  Patient Location:Home Provider Location: Home  PCP:  Lennox Solders, MD  Cardiologist:  Dr Jens Som  Evaluation Performed:  Follow-Up Visit  Chief Complaint:  FU CAD  History of Present Illness:    FUCAD.AlsoH/O MVPand orthostasis. Carotid Dopplers 2010 showed no significant stenosis.Echocardiogram repeated 9/16 and showed normal LV function.Patient had chest pain followed by abnormal nuclear study.Patient then had cardiac catheterization December 2018 that showed an 80% ostial ramus and ejection fraction 50-55%. Patient had PCI with drug-eluting stent.Seen with atypical chest pain January 2019 felt not to be cardiac. Since last seen,she has some dyspnea on exertion unchanged.  Occasional dizziness with standing but no syncope.  Occasional brief sharp pain in chest for 1 to 2 seconds but no exertional chest pain.  The patient does not have symptoms concerning for COVID-19 infection (fever, chills, cough, or new shortness of breath).    Past Medical History:  Diagnosis Date  . Coronary artery disease   . Depression   . Hyperlipidemia   . Hypertension   . Microcytic anemia   . Mitral valve prolapse     Past Surgical History:  Procedure Laterality Date  . CARDIAC CATHETERIZATION    . CORONARY STENT INTERVENTION N/A 03/01/2017   Procedure: CORONARY STENT INTERVENTION;  Surgeon: Kathleene Hazel, MD;  [facility];  Service: Cardiovascular;  Laterality: N/A;  . INTRAVASCULAR PRESSURE WIRE/FFR STUDY N/A 03/01/2017   Procedure: INTRAVASCULAR PRESSURE WIRE/FFR STUDY;  Surgeon: Kathleene Hazel, MD;  Location: [facility];  Service: Cardiovascular;  Laterality: N/A;  . LEFT HEART CATH AND CORONARY ANGIOGRAPHY N/A 03/01/2017   Procedure: LEFT HEART CATH AND CORONARY ANGIOGRAPHY;  Surgeon: Kathleene Hazel, MD;  Location: [facility];  Service: Cardiovascular;  Laterality: N/A;  . PARTIAL HYSTERECTOMY       Current Meds  Medication Sig  . amLODipine (NORVASC) 5 MG tablet TAKE 1 TABLET BY MOUTH EVERY DAY  . aspirin 81 MG tablet Take 81 mg by mouth daily.  [name] atorvastatin (LIPITOR) 40 MG tablet Take 1 tablet (40 mg total) by mouth daily.  . cyclobenzaprine (FLEXERIL) 10 MG tablet TAKE 1 TABLET (10 MG TOTAL) BY MOUTH DAILY AS NEEDED FOR MUSCLE SPASMS.  . ferrous sulfate 325 (65 FE) MG tablet TAKE 1 TABLET BY MOUTH EVERY DAY WITH BREAKFAST  . fluticasone (FLONASE) 50 MCG/ACT nasal spray Place 1 spray daily into both nostrils.  [name] gabapentin (NEURONTIN) 100 MG capsule TAKE 2 CAPSULES (200 MG TOTAL) BY MOUTH AT BEDTIME.  . metoprolol tartrate (LOPRESSOR) 25 MG tablet Take 0.5 tablets (12.5 mg total) by mouth 2 (two) times daily.  . pantoprazole (PROTONIX) 40 MG tablet TAKE 1 TABLET BY MOUTH TWICE A  DAY  . sertraline (ZOLOFT) 100 MG tablet TAKE 1 TABLET BY MOUTH EVERY DAY     Allergies:   Patient has no known allergies.   Social History   Tobacco Use  . Smoking status: Former Smoker    Packs/day: 0.50    Years: 20.00    Pack years: 10.00    Quit date: 07/30/2004    Years since quitting: 14.2  . Smokeless tobacco: Never Used  Substance Use Topics   . Alcohol use: Yes    Alcohol/week: 0.0 standard drinks    Comment: Occasional  . Drug use: No     Family Hx: The patient's family history includes Cancer in her father; Dementia in her sister; Diabetes in her mother; Heart disease in her mother; Hypertension in her mother and sister; Pancreatitis in her sister.  ROS:   Please see the history of present illness.    No Fever, chills  or productive cough All other systems reviewed and are negative.  Recent Lipid Panel Lab Results  Component Value Date/Time   CHOL 138 04/27/2017 11:47 AM   TRIG 103 04/27/2017 11:47 AM   HDL 49 04/27/2017 11:47 AM   CHOLHDL 4.3 02/11/2017 03:30 PM   CHOLHDL 3.1 05/29/2015 03:27 PM   LDLCALC 68 04/27/2017 11:47 AM   LDLDIRECT 129 (H) 01/01/2010 10:19 PM    Wt Readings from Last 3 Encounters:  11/09/18 170 lb (77.1 kg)  10/24/18 172 lb (78 kg)  01/31/18 180 lb 9.6 oz (81.9 kg)     Objective:    Vital Signs:  BP 134/72   Pulse 67   Ht 5\' 2"  (1.575 m)   Wt 170 lb (77.1 kg)   BMI 31.09 kg/m    VITAL SIGNS:  reviewed NAD Answers questions appropriately Normal affect Remainder of physical examination not performed (telehealth visit; coronavirus pandemic)  ASSESSMENT & PLAN:    1. Coronary artery disease-plan to continue medical therapy with aspirin and statin.  Occasional brief atypical chest pain.  Will have patient come to the office for electrocardiogram. 2. Hypertension-patient's blood pressure is controlled.  Continue present medications.  3. Hyperlipidemia-continue statin. Check lipids and liver. 4. History of orthostatic hypotension-symptoms are reasonably well controlled.  Continue to encourage fluid intake and sodium intake. 5. Possible history of mitral valve prolapse-not evident on most recent echocardiogram.  COVID-19 Education: The importance of social distancing was discussed today.  Time:   Today, I have spent 18 minutes with the patient with telehealth technology  discussing the above problems.     Medication Adjustments/Labs and Tests Ordered: Current medicines are reviewed at length with the patient today.  Concerns regarding medicines are outlined above.   Tests Ordered: No orders of the defined types were placed in this encounter.   Medication Changes: No orders of the defined types were placed in this encounter.   Follow Up:  Virtual Visit or In Person in 6 month(s)  Signed, Olga Millers, MD  11/09/2018 9:06 AM    [facility]

## 2018-11-09 NOTE — Patient Instructions (Signed)
Medication Instructions:  NO CHANGE If you need a refill on your cardiac medications before your next appointment, please call your pharmacy.   Lab work: Your physician recommends that you return for lab work PRIOR TO EATING If you have labs (blood work) drawn today and your tests are completely normal, you will receive your results only by: . MyChart Message (if you have MyChart) OR . A paper copy in the mail If you have any lab test that is abnormal or we need to change your treatment, we will call you to review the results.  Follow-Up: At [facility], you and your health needs are our priority.  As part of our continuing mission to provide you with exceptional heart care, we have created designated Provider Care Teams.  These Care Teams include your primary Cardiologist (physician) and Advanced Practice Providers (APPs -  Physician Assistants and Nurse Practitioners) who all work together to provide you with the care you need, when you need it. You will need a follow up appointment in 6 months.  Please call our office 2 months in advance to schedule this appointment.  You may see BRIAN CRENSHAW MD or one of the following Advanced Practice Providers on your designated Care Team:   Luke Kilroy, PA-C Krista Kroeger, PA-C . Callie Goodrich, PA-C     

## 2018-11-14 ENCOUNTER — Other Ambulatory Visit: Payer: Self-pay

## 2018-11-14 ENCOUNTER — Ambulatory Visit: Payer: Medicare Other | Attending: Family Medicine | Admitting: Physical Therapy

## 2018-11-14 ENCOUNTER — Encounter: Payer: Self-pay | Admitting: Physical Therapy

## 2018-11-14 ENCOUNTER — Ambulatory Visit (INDEPENDENT_AMBULATORY_CARE_PROVIDER_SITE_OTHER): Payer: Medicare Other | Admitting: *Deleted

## 2018-11-14 DIAGNOSIS — G8929 Other chronic pain: Secondary | ICD-10-CM | POA: Insufficient documentation

## 2018-11-14 DIAGNOSIS — R072 Precordial pain: Secondary | ICD-10-CM | POA: Diagnosis not present

## 2018-11-14 DIAGNOSIS — M79672 Pain in left foot: Secondary | ICD-10-CM | POA: Diagnosis present

## 2018-11-14 DIAGNOSIS — M545 Low back pain, unspecified: Secondary | ICD-10-CM

## 2018-11-14 DIAGNOSIS — R262 Difficulty in walking, not elsewhere classified: Secondary | ICD-10-CM

## 2018-11-14 DIAGNOSIS — M79671 Pain in right foot: Secondary | ICD-10-CM | POA: Insufficient documentation

## 2018-11-14 NOTE — Therapy (Signed)
[facility] [address] [place], [place], [address] Phone: [phone]   Fax:  [phone]  Physical Therapy Evaluation  Patient Details  Name: [name] [name] MRN: [id number] Date of Birth: 10-31-1950 Referring Provider (PT): Norton Blizzard, MD   Encounter Date: 11/14/2018  PT End of Session - 11/14/18 1609    Visit Number  1    Number of Visits  12    Date for PT Re-Evaluation  10/10/18    Authorization Type  [employer] MCR    PT Start Time  1550    PT Stop Time  1630    PT Time Calculation (min)  40 min    Activity Tolerance  Patient tolerated treatment well    Behavior During Therapy  [facility] for tasks assessed/performed       Past Medical History:  Diagnosis Date  . Coronary artery disease   . Depression   . Hyperlipidemia   . Hypertension   . Microcytic anemia   . Mitral valve prolapse     Past Surgical History:  Procedure Laterality Date  . CARDIAC CATHETERIZATION    . CORONARY STENT INTERVENTION N/A 03/01/2017   Procedure: CORONARY STENT INTERVENTION;  Surgeon: Kathleene Hazel, MD;  [facility];  Service: Cardiovascular;  Laterality: N/A;  . INTRAVASCULAR PRESSURE WIRE/FFR STUDY N/A 03/01/2017   Procedure: INTRAVASCULAR PRESSURE WIRE/FFR STUDY;  Surgeon: Kathleene Hazel, MD;  [facility];  Service: Cardiovascular;  Laterality: N/A;  . LEFT HEART CATH AND CORONARY ANGIOGRAPHY N/A 03/01/2017   Procedure: LEFT HEART CATH AND CORONARY ANGIOGRAPHY;  Surgeon: Kathleene Hazel, MD;  Location: [facility];  Service: Cardiovascular;  Laterality: N/A;  . PARTIAL HYSTERECTOMY      There were no vitals filed for this visit.   Subjective Assessment - 11/14/18 1729    Subjective  Pt arrived today reporting low back pain and and R heel pain. Pt reporting her pain is chronic and has been going on over the last several months. Pt reproting she has been to therpay for 2  visits but had to stop due to life's circumstances. Pt reporting prolonged standing and walking can be painful. Pt also limited about 25% in her work activites and needs to take frequent rest breaks. 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 Pt with no radiation symptoms reported. Skilled PT needed to progress pt toward her PLOF.    Pertinent History  ONG:EXBMWUXLKG, sleep disorder, history of chest pain, CAD and mitral valve disorder, obesity, HTN    Limitations  Sitting;House hold activities;Lifting;Standing;Walking    How long can you stand comfortably?  depends can stand 1-2 hours but reports pain    Diagnostic tests  no recent imaging    Patient Stated Goals  walk and work without pain. Pt wants to get back to riding her motorocycle.    Currently in Pain?  Yes    Pain Score  2     Pain Location  Heel    Pain Orientation  Right    Pain Descriptors / Indicators  Aching;Sore    Pain Type  Chronic pain    Pain Onset  More than a month ago    Pain Frequency  Intermittent    Aggravating Factors   getting up out of bed in morning and right after sitting for long periods    Pain Relieving Factors  heat    Effect of Pain on Daily Activities  limits pt's  work and walking    Multiple Pain Sites  Yes    Pain Score  2   low back pain        [facility] PT Assessment - 11/14/18 0001      Assessment   Medical Diagnosis  Chronic LBP, bilat heel pain Rt>Lt    Referring Provider (PT)  Norton Blizzard, MD    Prior Therapy  yes for heel and back pain but only attended 2 visits in June 2020      Precautions   Precautions  None      Restrictions   Weight Bearing Restrictions  No      Balance Screen   Has the patient fallen in the past 6 months  No      Prior Function   Level of Independence  Independent    Vocation  Part time employment    Vocation Requirements  standing and housekeeping work    Leisure  ride Physicist, medical   Overall Cognitive Status  Within Functional Limits for  tasks assessed      Observation/Other Assessments   Focus on Therapeutic Outcomes (FOTO)   44% limitation      Sensation   Light Touch  Appears Intact      AROM   AROM Assessment Site  Ankle;Lumbar    Right/Left Ankle  --   DF R 8 degrees, L 12 degrees   Lumbar Flexion  25% limited    Lumbar Extension  75% limitation    Lumbar - Right Rotation  50% limitation    Lumbar - Left Rotation  50% limitation      Strength   Overall Strength  Within functional limits for tasks performed    Overall Strength Comments  bilateral LE's grossly 4+/5, limited core strength      Flexibility   Soft Tissue Assessment /Muscle Length  yes    Hamstrings  R: 58 degrees, L : 65 degrees      Palpation   Palpation comment  TTP lumbar-sacral spine and paraspinals, R heel and plantar surface of R foot      Special Tests   Other special tests  + slump test on R      Ambulation/Gait   Gait Comments  Pt with step through gait pattern. Pt with decresed stance phase on the R due to foot pain.                 Objective measurements completed on examination: See above findings.              PT Education - 11/14/18 1608    Education Details  HEP, soft tissue mobilizations using a tennis ball on r foot and down bilateral IT bands.    Person(s) Educated  Patient    Methods  Explanation;Demonstration;Handout    Comprehension  Verbalized understanding;Returned demonstration;Verbal cues required          PT Long Term Goals - 11/14/18 1625      PT LONG TERM GOAL #1   Title  Pt will be I and compliant with HEP and progression.    Time  6    Period  Weeks    Status  New    Target Date  12/26/18      PT LONG TERM GOAL #2   Title  Pt will improve bilateral hamstring flexibilty to 70 degrees in order to improve functional mobility.    Baseline  R: 58 degrees, L 65  degrees    Time  8    Period  Weeks    Status  New      PT LONG TERM GOAL #3   Title  Pt will improve FOTO to less  than 32% limitation to show improved functional mobility    Baseline  44% limitation on 11/14/2018    Time  8    Period  Weeks    Status  New      PT LONG TERM GOAL #4   Title  Pt will be able to perform a full shift at work with pain in R heel </= 2/10.    Baseline  pt reporting her heel pain can increase to 5-6/10 while working and she requires rest breaks    Time  8    Period  Weeks    Status  New    Target Date  01/06/19             Plan - 11/14/18 1622    Clinical Impression Statement  Pt presenting today for PT evaluation of chronic LBP and R heel pain. Pt with tenderness to palpation of R heel and lumbar paraspinals. Pt was instructed in low back stretches and heel cord stretches. Pt also educated in [facility] using a tennis ball on her R foot and IT bands. Pt with core weakness and lumbar stiffness.  Pt could benefit from skilled PT to progress toward pt's PLOF. Pt's goal is to ride her motocycle without back pain.    Personal Factors and Comorbidities  Comorbidity 1;Comorbidity 2;Comorbidity 3+    Comorbidities  DGL:[id number], sleep disorder, history of chest pain, CAD and mitral valve disorder, obesity, HTN    Examination-Activity Limitations  Squat;Lift;Stairs;Stand;Locomotion Level;Bend    Examination-Participation Restrictions  Laundry;Shop;Cleaning;Community Activity    Stability/Clinical Decision Making  Evolving/Moderate complexity    Clinical Decision Making  Moderate    Rehab Potential  Good    PT Frequency  2x / week    PT Duration  6 weeks    PT Treatment/Interventions  Cryotherapy;Electrical Stimulation;Moist Heat;Traction;Ultrasound;Gait Network engineer;Therapeutic exercise;Neuromuscular re-education;Manual techniques;Passive range of motion;Dry needling;Taping;Spinal Manipulations;Joint Manipulations    PT Next Visit Plan  review HEP, consider MT and modalaties for pain, needs lumbar, hip, and gastroc stretching and core strength    PT Home Exercise Plan   HSS, piriformis stretch, LTR, bridge, POE, PPT, supine march    Consulted and Agree with Plan of Care  Patient       Patient will benefit from skilled therapeutic intervention in order to improve the following deficits and impairments:  Abnormal gait, Decreased activity tolerance, Decreased endurance, Decreased range of motion, Decreased strength, Hypomobility, Difficulty walking, Impaired flexibility, Increased muscle spasms, Pain, Postural dysfunction, Improper body mechanics  Visit Diagnosis: 1. Chronic bilateral low back pain without sciatica   2. Pain in right foot   3. Difficulty in walking, not elsewhere classified        Problem List Patient Active Problem List   Diagnosis Date Noted  . Plantar fasciitis of left foot 10/24/2018  . Neuropathy due to medical condition (HCC) 01/31/2018  . Difficulty sleeping 01/31/2018  . Unstable angina (HCC)   . Heel pain, bilateral 09/07/2016  . Orthostatic hypotension 09/07/2016  . Right hip pain 08/19/2016  . Lumbar strain 04/27/2016  . Left knee pain 04/27/2016  . Finger pain, right 04/27/2016  . Neck pain 11/23/2015  . Low back pain 07/11/2015  . Right shoulder pain 04/25/2015  . GERD (gastroesophageal reflux disease) 01/28/2015  .  Microcytic anemia 01/28/2015  . Near syncope 11/09/2014  . Leg cramps 11/15/2013  . Vitamin D deficiency 11/15/2013  . Prediabetes 03/01/2012  . Chest pain 08/18/2011  . Anxiety and depression 05/31/2009  . Mitral valve disorder 04/15/2009  . CARPAL TUNNEL SYNDROME 01/26/2008  . OBESITY 01/04/2008  . HYPERCHOLESTEROLEMIA 06/24/2006  . Essential hypertension 06/24/2006    [name] [name] , PT 11/14/2018, 5:47 PM  [facility] [address] [place], [place], [address] Phone: [phone]   Fax:  [phone]  Name: [name] [name] MRN: [id number] Date of Birth: July 02, 1950

## 2018-11-14 NOTE — Progress Notes (Signed)
1.) Reason for visit: EKG  2.) Name of MD requesting visit: CRENSHAW  3.) H&P: ekg requested after telehealth visit.   4.) ROS related to problem: patient c/o occ chest pain and fluttering in the chest  5.) Assessment and plan per MD: EKG reviewed by hao meng pa, sinus bradycardia and no sign ST-T wave changes, no ischemia. Will scan into chart for dr Stanford Breed review.

## 2018-11-15 ENCOUNTER — Encounter: Payer: Self-pay | Admitting: *Deleted

## 2018-11-15 ENCOUNTER — Encounter: Payer: Self-pay | Admitting: Physical Therapy

## 2018-11-15 ENCOUNTER — Ambulatory Visit: Payer: Medicare Other | Admitting: Physical Therapy

## 2018-11-15 DIAGNOSIS — M545 Low back pain: Secondary | ICD-10-CM | POA: Diagnosis not present

## 2018-11-15 DIAGNOSIS — M79672 Pain in left foot: Secondary | ICD-10-CM

## 2018-11-15 DIAGNOSIS — G8929 Other chronic pain: Secondary | ICD-10-CM

## 2018-11-15 DIAGNOSIS — M79671 Pain in right foot: Secondary | ICD-10-CM

## 2018-11-15 DIAGNOSIS — R262 Difficulty in walking, not elsewhere classified: Secondary | ICD-10-CM

## 2018-11-15 LAB — LIPID PANEL
Chol/HDL Ratio: 2.6 ratio (ref 0.0–4.4)
Cholesterol, Total: 155 mg/dL (ref 100–199)
HDL: 60 mg/dL (ref >39)
LDL Calculated: 79 mg/dL (ref 0–99)
Triglycerides: 79 mg/dL (ref 0–149)
VLDL Cholesterol Cal: 16 mg/dL (ref 5–40)

## 2018-11-15 LAB — HEPATIC FUNCTION PANEL
ALT: 15 IU/L (ref 0–32)
AST: 12 IU/L (ref 0–40)
Albumin: 4.3 g/dL (ref 3.8–4.8)
Alkaline Phosphatase: 68 IU/L (ref 39–117)
Bilirubin Total: 0.4 mg/dL (ref 0.0–1.2)
Bilirubin, Direct: 0.15 mg/dL (ref 0.00–0.40)
Total Protein: 6.4 g/dL (ref 6.0–8.5)

## 2018-11-15 NOTE — Therapy (Signed)
[facility] [address] [place], [place], [address] Phone: [phone]   Fax:  [phone]  Physical Therapy Treatment  Patient Details  Name: [name] [name] MRN: [id number] Date of Birth: 1950-10-09 Referring Provider (PT): Norton Blizzard, MD   Encounter Date: 11/15/2018  PT End of Session - 11/15/18 1154    Visit Number  2    Number of Visits  12    Date for PT Re-Evaluation  10/10/18    Authorization Type  UHC MCR    PT Start Time  1154   pt arrived late   PT Stop Time  1225    PT Time Calculation (min)  31 min    Activity Tolerance  Patient tolerated treatment well    Behavior During Therapy  [facility] for tasks assessed/performed       Past Medical History:  Diagnosis Date  . Coronary artery disease   . Depression   . Hyperlipidemia   . Hypertension   . Microcytic anemia   . Mitral valve prolapse     Past Surgical History:  Procedure Laterality Date  . CARDIAC CATHETERIZATION    . CORONARY STENT INTERVENTION N/A 03/01/2017   Procedure: CORONARY STENT INTERVENTION;  Surgeon: Kathleene Hazel, MD;  [facility]: [facility];  Service: Cardiovascular;  Laterality: N/A;  . INTRAVASCULAR PRESSURE WIRE/FFR STUDY N/A 03/01/2017   Procedure: INTRAVASCULAR PRESSURE WIRE/FFR STUDY;  Surgeon: Kathleene Hazel, MD;  Location: [facility];  Service: Cardiovascular;  Laterality: N/A;  . LEFT HEART CATH AND CORONARY ANGIOGRAPHY N/A 03/01/2017   Procedure: LEFT HEART CATH AND CORONARY ANGIOGRAPHY;  Surgeon: Kathleene Hazel, MD;  [facility]: [facility];  Service: Cardiovascular;  Laterality: N/A;  . PARTIAL HYSTERECTOMY      There were no vitals filed for this visit.  Subjective Assessment - 11/15/18 1156    Subjective  My heel is terrible today.    Patient Stated Goals  walk and work without pain. Pt wants to get back to riding her motorocycle.         [facility] PT Assessment - 11/15/18  0001      ROM / Strength   AROM / PROM / Strength  AROM                   [facility] Adult PT Treatment/Exercise - 11/15/18 0001      Exercises   Exercises  Ankle      Lumbar Exercises: Aerobic   Nustep  L4 UE & LE 5 min      Ankle Exercises: Stretches   Gastroc Stretch  2 reps;30 seconds    Gastroc Stretch Limitations  long sitting with towel; also performed in standing at counter      Ankle Exercises: Seated   Towel Crunch Limitations  , cues for feet flat    Toe Raise  15 reps    Toe Raise Limitations  ball bw knees    Heel Slides  15 reps;Right;Left    Other Seated Ankle Exercises  toe yoga                  PT Long Term Goals - 11/14/18 1625      PT LONG TERM GOAL #1   Title  Pt will be I and compliant with HEP and progression.    Time  6    Period  Weeks    Status  New    Target Date  12/26/18  PT LONG TERM GOAL #2   Title  Pt will improve bilateral hamstring flexibilty to 70 degrees in order to improve functional mobility.    Baseline  R: 58 degrees, L 65 degrees    Time  8    Period  Weeks    Status  New      PT LONG TERM GOAL #3   Title  Pt will improve FOTO to less than 32% limitation to show improved functional mobility    Baseline  44% limitation on 11/14/2018    Time  8    Period  Weeks    Status  New      PT LONG TERM GOAL #4   Title  Pt will be able to perform a full shift at work with pain in R heel </= 2/10.    Baseline  pt reporting her heel pain can increase to 5-6/10 while working and she requires rest breaks    Time  8    Period  Weeks    Status  New    Target Date  01/06/19            Plan - 11/15/18 1243    Clinical Impression Statement  Focused on plantar fascia today since it was hurting so much. Poor strength and coordination in plantar musculature. educated on seated and standing DF stretches to incoorporate into her day.    PT Treatment/Interventions  Cryotherapy;Electrical Stimulation;Moist  Heat;Traction;Ultrasound;Gait Network engineer;Therapeutic exercise;Neuromuscular re-education;Manual techniques;Passive range of motion;Dry needling;Taping;Spinal Manipulations;Joint Manipulations    PT Next Visit Plan  review HEP, consider MT and modalaties for pain, needs lumbar, hip, and gastroc stretching and core strength    PT Home Exercise Plan  HSS, piriformis stretch, LTR, bridge, POE, PPT, supine march, long sit & standing DF stretch, toe yoga, toe raises    Consulted and Agree with Plan of Care  Patient       Patient will benefit from skilled therapeutic intervention in order to improve the following deficits and impairments:  Abnormal gait, Decreased activity tolerance, Decreased endurance, Decreased range of motion, Decreased strength, Hypomobility, Difficulty walking, Impaired flexibility, Increased muscle spasms, Pain, Postural dysfunction, Improper body mechanics  Visit Diagnosis: 1. Chronic bilateral low back pain without sciatica   2. Pain in right foot   3. Difficulty in walking, not elsewhere classified   4. Pain in left foot        Problem List Patient Active Problem List   Diagnosis Date Noted  . Plantar fasciitis of left foot 10/24/2018  . Neuropathy due to medical condition (HCC) 01/31/2018  . Difficulty sleeping 01/31/2018  . Unstable angina (HCC)   . Heel pain, bilateral 09/07/2016  . Orthostatic hypotension 09/07/2016  . Right hip pain 08/19/2016  . Lumbar strain 04/27/2016  . Left knee pain 04/27/2016  . Finger pain, right 04/27/2016  . Neck pain 11/23/2015  . Low back pain 07/11/2015  . Right shoulder pain 04/25/2015  . GERD (gastroesophageal reflux disease) 01/28/2015  . Microcytic anemia 01/28/2015  . Near syncope 11/09/2014  . Leg cramps 11/15/2013  . Vitamin D deficiency 11/15/2013  . Prediabetes 03/01/2012  . Chest pain 08/18/2011  . Anxiety and depression 05/31/2009  . Mitral valve disorder 04/15/2009  . CARPAL TUNNEL SYNDROME  01/26/2008  . OBESITY 01/04/2008  . HYPERCHOLESTEROLEMIA 06/24/2006  . Essential hypertension 06/24/2006    Tiawanna Luchsinger C. Takyah Ciaramitaro PT, DPT 11/15/18 12:46 PM   [facility] [address] [place], [place], [address] Phone:  [phone]   Fax:  [phone]  Name: [name] [name] MRN: [id number] Date of Birth: 09/10/1950

## 2018-11-25 ENCOUNTER — Other Ambulatory Visit: Payer: Self-pay

## 2018-11-25 ENCOUNTER — Encounter: Payer: Self-pay | Admitting: Physical Therapy

## 2018-11-25 ENCOUNTER — Encounter

## 2018-11-25 ENCOUNTER — Ambulatory Visit: Payer: Medicare Other | Admitting: Physical Therapy

## 2018-11-25 DIAGNOSIS — R262 Difficulty in walking, not elsewhere classified: Secondary | ICD-10-CM

## 2018-11-25 DIAGNOSIS — M545 Low back pain: Secondary | ICD-10-CM | POA: Diagnosis not present

## 2018-11-25 DIAGNOSIS — M79672 Pain in left foot: Secondary | ICD-10-CM

## 2018-11-25 DIAGNOSIS — M79671 Pain in right foot: Secondary | ICD-10-CM

## 2018-11-25 DIAGNOSIS — G8929 Other chronic pain: Secondary | ICD-10-CM

## 2018-11-25 NOTE — Therapy (Signed)
Anxiety and depression 05/31/2009  . Mitral valve disorder 04/15/2009  . CARPAL TUNNEL SYNDROME 01/26/2008  . OBESITY 01/04/2008  . HYPERCHOLESTEROLEMIA 06/24/2006  . Essential hypertension 06/24/2006   Rosha Cocker C. Kaisha Wachob PT, DPT 11/25/18 1:22 PM   [place], [place], [address] Phone: [phone]   Fax:  [phone]  Name: [name] [name] MRN: [id number] Date of Birth: 09-Nov-1950  [place], [place], [address] Phone: [phone]   Fax:  [phone]  Physical Therapy Treatment  Patient Details  Name: [name] [name] MRN: [id number] Date of Birth: 09-01-1950 Referring Provider (PT): Karlton Lemon, MD   Encounter Date: 11/25/2018  PT End of Session - 11/25/18 1320    Visit Number  3    Number of Visits  12    Date for PT Re-Evaluation  10/10/18    Authorization Type  UHC MCR    PT Start Time  [id number]   pt arrived late   PT Stop Time  1315    PT Time Calculation (min)  40 min    Activity Tolerance  Patient tolerated treatment well    Behavior During Therapy  [facility] for tasks assessed/performed       Past Medical History:  Diagnosis Date  . Coronary artery disease   . Depression   . Hyperlipidemia   . Hypertension   . Microcytic anemia   . Mitral valve prolapse     Past Surgical History:  Procedure Laterality Date  . CARDIAC CATHETERIZATION    . CORONARY STENT INTERVENTION N/A 03/01/2017   Procedure: CORONARY STENT INTERVENTION;  Surgeon: Burnell Blanks, MD;  Location: [place] CV LAB;  Service: Cardiovascular;  Laterality: N/A;  . INTRAVASCULAR PRESSURE WIRE/FFR STUDY N/A 03/01/2017   Procedure: INTRAVASCULAR PRESSURE WIRE/FFR STUDY;  Surgeon: Burnell Blanks, MD;  Location: [place] CV [facility];  Service: Cardiovascular;  Laterality: N/A;  . LEFT HEART CATH AND CORONARY ANGIOGRAPHY N/A 03/01/2017   Procedure: LEFT HEART CATH AND CORONARY ANGIOGRAPHY;  Surgeon: Burnell Blanks, MD;  Location: [place] CV LAB;  Service: Cardiovascular;  Laterality: N/A;  . PARTIAL HYSTERECTOMY      There were no vitals filed for this visit.  Subjective Assessment - 11/25/18 1237    Subjective  My back is really only bothering me when my heel hurts. My heel is the main problem.    Patient Stated Goals  walk and work without pain. Pt wants to get back to riding her  motorocycle.    Currently in Pain?  Yes    Pain Score  2     Pain Location  Heel    Pain Orientation  Right    Pain Descriptors / Indicators  Sharp    Aggravating Factors   walking    Pain Relieving Factors  rest                       [facility] Adult PT Treatment/Exercise - 11/25/18 0001      Exercises   Exercises  Ankle      Modalities   Modalities  Ultrasound      Ultrasound   Ultrasound Location  Rt heel    Ultrasound Parameters  .5w/cm2 8 min pulsed      Manual Therapy   Manual Therapy  Soft tissue mobilization    Soft tissue mobilization  Rt lateral gastroc, plantar fascia      Ankle Exercises: Stretches   Gastroc Stretch Limitations  long sitting with towel      Ankle Exercises: Seated   Towel Crunch Limitations  2 min    Other Seated Ankle Exercises  large range DF/PF    Other Seated Ankle Exercises  yellow tband- inversion, eversion, DF                  PT Long Term Goals -  Anxiety and depression 05/31/2009  . Mitral valve disorder 04/15/2009  . CARPAL TUNNEL SYNDROME 01/26/2008  . OBESITY 01/04/2008  . HYPERCHOLESTEROLEMIA 06/24/2006  . Essential hypertension 06/24/2006   Rosha Cocker C. Kaisha Wachob PT, DPT 11/25/18 1:22 PM   [place], [place], [address] Phone: [phone]   Fax:  [phone]  Name: [name] [name] MRN: [id number] Date of Birth: 09-Nov-1950

## 2018-11-28 ENCOUNTER — Ambulatory Visit: Payer: Medicare Other | Admitting: Physical Therapy

## 2018-11-30 ENCOUNTER — Encounter: Payer: Self-pay | Admitting: Physical Therapy

## 2018-11-30 ENCOUNTER — Ambulatory Visit: Payer: Medicare Other | Attending: Family Medicine | Admitting: Physical Therapy

## 2018-11-30 ENCOUNTER — Other Ambulatory Visit: Payer: Self-pay

## 2018-11-30 DIAGNOSIS — M79672 Pain in left foot: Secondary | ICD-10-CM | POA: Diagnosis present

## 2018-11-30 DIAGNOSIS — R262 Difficulty in walking, not elsewhere classified: Secondary | ICD-10-CM | POA: Insufficient documentation

## 2018-11-30 DIAGNOSIS — M545 Low back pain, unspecified: Secondary | ICD-10-CM

## 2018-11-30 DIAGNOSIS — G8929 Other chronic pain: Secondary | ICD-10-CM | POA: Insufficient documentation

## 2018-11-30 DIAGNOSIS — M79671 Pain in right foot: Secondary | ICD-10-CM | POA: Diagnosis present

## 2018-11-30 NOTE — Therapy (Signed)
[place], [place], [address] Phone: [phone]   Fax:  [phone]  Physical Therapy Treatment  Patient Details  Name: [name] [name] MRN: [id number] Date of Birth: 1950/05/08 Referring Provider (PT): [name] Lemon, MD   Encounter Date: 11/30/2018  PT End of Session - 11/30/18 1325    Visit Number  4    Number of Visits  12    Date for PT Re-Evaluation  01/13/19    Authorization Type  UHC MCR    PT Start Time  0118    PT Stop Time  0205    PT Time Calculation (min)  47 min       Past Medical History:  Diagnosis Date  . Coronary artery disease   . Depression   . Hyperlipidemia   . Hypertension   . Microcytic anemia   . Mitral valve prolapse     Past Surgical History:  Procedure Laterality Date  . CARDIAC CATHETERIZATION    . CORONARY STENT INTERVENTION N/A 03/01/2017   Procedure: CORONARY STENT INTERVENTION;  Surgeon: Burnell Blanks, MD;  Location: [place] CV LAB;  Service: Cardiovascular;  Laterality: N/A;  . INTRAVASCULAR PRESSURE WIRE/FFR STUDY N/A 03/01/2017   Procedure: INTRAVASCULAR PRESSURE WIRE/FFR STUDY;  Surgeon: Burnell Blanks, MD;  Location: [place] CV LAB;  Service: Cardiovascular;  Laterality: N/A;  . LEFT HEART CATH AND CORONARY ANGIOGRAPHY N/A 03/01/2017   Procedure: LEFT HEART CATH AND CORONARY ANGIOGRAPHY;  Surgeon: Burnell Blanks, MD;  Location: [place] CV LAB;  Service: Cardiovascular;  Laterality: N/A;  . PARTIAL HYSTERECTOMY      There were no vitals filed for this visit.  Subjective Assessment - 11/30/18 1323    Subjective  My back is hurting more last couple of days. 5/10 pain now. Can we do something for the pain? I went to [employer] and got new shoes.    Currently in Pain?  Yes    Pain Score  5     Pain Location  Back    Pain Orientation  Lower    Pain Descriptors / Indicators  Aching;Spasm                        [facility] Adult PT Treatment/Exercise - 11/30/18 0001      Lumbar Exercises: Stretches   Active Hamstring Stretch  3 reps;30 seconds    Active Hamstring Stretch Limitations  supine strap    Piriformis Stretch  30 seconds;2 reps    Piriformis Stretch Limitations  push pull      Lumbar Exercises: Aerobic   Nustep  L5 UE & LE 5 min      Lumbar Exercises: Supine   Pelvic Tilt  10 reps    Pelvic Tilt Limitations  significant cues required     Clam  20 reps    Clam Limitations  level 3 , green     Bridge with clamshell  10 reps    Bridge with [employer] Limitations  green band       Lumbar Exercises: Sidelying   Hip Abduction  Right;Left;10 reps   2 Barrister's clerk  Lumbar    Electrical Stimulation Action  IFC x 15 min    Electrical Stimulation Parameters  16ma    Electrical Stimulation Goals  Pain             PT Education -  [place], [place], [address] Phone: [phone]   Fax:  [phone]  Physical Therapy Treatment  Patient Details  Name: [name] [name] MRN: [id number] Date of Birth: 1950/05/08 Referring Provider (PT): [name] Lemon, MD   Encounter Date: 11/30/2018  PT End of Session - 11/30/18 1325    Visit Number  4    Number of Visits  12    Date for PT Re-Evaluation  01/13/19    Authorization Type  UHC MCR    PT Start Time  0118    PT Stop Time  0205    PT Time Calculation (min)  47 min       Past Medical History:  Diagnosis Date  . Coronary artery disease   . Depression   . Hyperlipidemia   . Hypertension   . Microcytic anemia   . Mitral valve prolapse     Past Surgical History:  Procedure Laterality Date  . CARDIAC CATHETERIZATION    . CORONARY STENT INTERVENTION N/A 03/01/2017   Procedure: CORONARY STENT INTERVENTION;  Surgeon: Burnell Blanks, MD;  Location: [place] CV LAB;  Service: Cardiovascular;  Laterality: N/A;  . INTRAVASCULAR PRESSURE WIRE/FFR STUDY N/A 03/01/2017   Procedure: INTRAVASCULAR PRESSURE WIRE/FFR STUDY;  Surgeon: Burnell Blanks, MD;  Location: [place] [facility];  Service: Cardiovascular;  Laterality: N/A;  . LEFT HEART CATH AND CORONARY ANGIOGRAPHY N/A 03/01/2017   Procedure: LEFT HEART CATH AND CORONARY ANGIOGRAPHY;  Surgeon: Burnell Blanks, MD;  Location: [place] CV LAB;  Service: Cardiovascular;  Laterality: N/A;  . PARTIAL HYSTERECTOMY      There were no vitals filed for this visit.  Subjective Assessment - 11/30/18 1323    Subjective  My back is hurting more last couple of days. 5/10 pain now. Can we do something for the pain? I went to [employer] and got new shoes.    Currently in Pain?  Yes    Pain Score  5     Pain Location  Back    Pain Orientation  Lower    Pain Descriptors / Indicators  Aching;Spasm                        [facility] Adult PT Treatment/Exercise - 11/30/18 0001      Lumbar Exercises: Stretches   Active Hamstring Stretch  3 reps;30 seconds    Active Hamstring Stretch Limitations  supine strap    Piriformis Stretch  30 seconds;2 reps    Piriformis Stretch Limitations  push pull      Lumbar Exercises: Aerobic   Nustep  L5 UE & LE 5 min      Lumbar Exercises: Supine   Pelvic Tilt  10 reps    Pelvic Tilt Limitations  significant cues required     Clam  20 reps    Clam Limitations  level 3 , green     Bridge with clamshell  10 reps    Bridge with [employer] Limitations  green band       Lumbar Exercises: Sidelying   Hip Abduction  Right;Left;10 reps   2 Barrister's clerk  Lumbar    Electrical Stimulation Action  IFC x 15 min    Electrical Stimulation Parameters  16ma    Electrical Stimulation Goals  Pain             PT Education -  anemia  01/28/2015  . Near syncope 11/09/2014  . Leg cramps 11/15/2013  . Vitamin D deficiency 11/15/2013  . Prediabetes 03/01/2012  . Chest pain 08/18/2011  . Anxiety and depression 05/31/2009  . Mitral valve disorder 04/15/2009  . CARPAL TUNNEL SYNDROME 01/26/2008  . OBESITY 01/04/2008  . HYPERCHOLESTEROLEMIA 06/24/2006  . Essential hypertension 06/24/2006    [name], PTA 11/30/2018, 2:11 PM  [facility] [address] [place], [place], [address] Phone: [phone]   Fax:  [phone]  Name: [name] [name] MRN: [id number] Date of Birth: 03-Aug-1950

## 2018-12-06 ENCOUNTER — Ambulatory Visit: Payer: Medicare Other | Admitting: Physical Therapy

## 2018-12-06 ENCOUNTER — Telehealth: Payer: Self-pay | Admitting: Physical Therapy

## 2018-12-06 NOTE — Telephone Encounter (Signed)
I called and spoke to pt about missing her PT appointment today at 11:45am. Pt reporting she just missed it on her schedule. Pt has another appointment scheduled on Thursday of his week.   Kearney Hard, PT 12/06/18 1:47 PM

## 2018-12-07 ENCOUNTER — Ambulatory Visit (INDEPENDENT_AMBULATORY_CARE_PROVIDER_SITE_OTHER): Payer: Medicare Other | Admitting: Family Medicine

## 2018-12-07 ENCOUNTER — Other Ambulatory Visit: Payer: Self-pay

## 2018-12-07 ENCOUNTER — Encounter: Payer: Self-pay | Admitting: Family Medicine

## 2018-12-07 DIAGNOSIS — M722 Plantar fascial fibromatosis: Secondary | ICD-10-CM | POA: Diagnosis not present

## 2018-12-07 NOTE — Progress Notes (Signed)
[facility] [address] [place], [place] [address] Phone: [phone] Fax: [phone]   Patient Name: [name] [name] Date of Birth: 1950/12/24 Medical Record Number: [id number] Gender: female Date of Encounter: 12/07/2018  CC: Right plantar fasciitis  HPI: Pt is here to follow-up for her right plantar fasciitis.  She was last seen 6 weeks ago at which time she had an injection to the plantar fascia.  She feels like the injection did not offer her any benefit.  She did go to [employer] and purchased new [place] shoes to help with arch support and discomfort.  She still has pain worse in the morning and after a long shift at work on her feet.  She denies any new trauma, swelling, weakness, numbness, skin changes.   Past Medical History:  Diagnosis Date  . Coronary artery disease   . Depression   . Hyperlipidemia   . Hypertension   . Microcytic anemia   . Mitral valve prolapse     Current Outpatient Medications on File Prior to Visit  Medication Sig Dispense Refill  . amLODipine (NORVASC) 5 MG tablet TAKE 1 TABLET BY MOUTH EVERY DAY 90 tablet 3  . aspirin 81 MG tablet Take 81 mg by mouth daily.    [name] atorvastatin (LIPITOR) 40 MG tablet Take 1 tablet (40 mg total) by mouth daily. 30 tablet 3  . cyclobenzaprine (FLEXERIL) 10 MG tablet TAKE 1 TABLET (10 MG TOTAL) BY MOUTH DAILY AS NEEDED FOR MUSCLE SPASMS. 30 tablet 1  . ferrous sulfate 325 (65 FE) MG tablet TAKE 1 TABLET BY MOUTH EVERY DAY WITH BREAKFAST 90 tablet 1  . fluticasone (FLONASE) 50 MCG/ACT nasal spray Place 1 spray daily into both nostrils.    [name] gabapentin (NEURONTIN) 100 MG capsule TAKE 2 CAPSULES (200 MG TOTAL) BY MOUTH AT BEDTIME. 180 capsule 1  . metoprolol tartrate (LOPRESSOR) 25 MG tablet Take 0.5 tablets (12.5 mg total) by mouth 2 (two) times daily. 60 tablet 6  . pantoprazole (PROTONIX) 40 MG tablet TAKE 1 TABLET BY MOUTH TWICE A DAY 180 tablet 3  . sertraline (ZOLOFT) 100 MG  tablet TAKE 1 TABLET BY MOUTH EVERY DAY 90 tablet 0  . nitroGLYCERIN (NITROSTAT) 0.4 MG SL tablet PLACE 1 TABLET (0.4 MG TOTAL) UNDER THE TONGUE EVERY 5 (FIVE) MINUTES AS NEEDED FOR CHEST PAIN. 25 tablet 7   No current facility-administered medications on file prior to visit.     Past Surgical History:  Procedure Laterality Date  . CARDIAC CATHETERIZATION    . CORONARY STENT INTERVENTION N/A 03/01/2017   Procedure: CORONARY STENT INTERVENTION;  Surgeon: Kathleene Hazel, MD;  [facility];  Service: Cardiovascular;  Laterality: N/A;  . INTRAVASCULAR PRESSURE WIRE/FFR STUDY N/A 03/01/2017   Procedure: INTRAVASCULAR PRESSURE WIRE/FFR STUDY;  Surgeon: Kathleene Hazel, MD;  [facility];  Service: Cardiovascular;  Laterality: N/A;  . LEFT HEART CATH AND CORONARY ANGIOGRAPHY N/A 03/01/2017   Procedure: LEFT HEART CATH AND CORONARY ANGIOGRAPHY;  Surgeon: Kathleene Hazel, MD;  [facility];  Service: Cardiovascular;  Laterality: N/A;  . PARTIAL HYSTERECTOMY      No Known Allergies  Social History   Socioeconomic History  . Marital status: Married    Spouse name: Not on file  . Number of children: 3  . Years of education: Not on file  . Highest education level: Not on file  Occupational History    Comment: Retired  Social Needs  . Financial resource strain: Not on file  . Food insecurity    Worry: Not on file    Inability: Not on file  . Transportation needs    Medical: Not on file    Non-medical: Not on file  Tobacco Use  . Smoking status: Former Smoker    Packs/day: 0.50    Years: 20.00    Pack years: 10.00    Quit date: 07/30/2004    Years since quitting: 14.3  . Smokeless tobacco: Never Used  Substance and Sexual Activity  . Alcohol use: Yes    Alcohol/week: 0.0 standard drinks    Comment: Occasional  . Drug use: No  . Sexual activity: Yes    Partners: Male  Lifestyle  . Physical activity    Days per  week: 4 days    Minutes per session: 30 min  . Stress: Not at all  Relationships  . Social Musician on phone: Not on file    Gets together: Not on file    Attends religious service: Not on file    Active member of club or organization: Not on file    Attends meetings of clubs or organizations: Not on file    Relationship status: Not on file  . Intimate partner violence    Fear of current or ex partner: Not on file    Emotionally abused: Not on file    Physically abused: Not on file    Forced sexual activity: Not on file  Other Topics Concern  . Not on file  Social History Narrative  . Not on file    Family History  Problem Relation Age of Onset  . Heart disease Mother        CHF  . Diabetes Mother   . Hypertension Mother   . Cancer Father   . Hypertension Sister   . Pancreatitis Sister   . Dementia Sister     BP 134/72   Ht 5\' 2"  (1.575 m)   Wt 169 lb (76.7 kg)   BMI 30.91 kg/m   ROS:  See HPI CONST: no F/C, no malaise, no fatigue MSK: See above NEURO: no numbness/tingling, no weakness SKIN: no rash, no lesions HEME: no bleeding, no bruising, no erythema  Objective: Right foot: No swelling, erythema, or bruising, instability. Transverse arch well supported without tenderness Mild TTP at medial calcaneal tuberosity Full ROM at ankle and toes Strength 5/5 at ankle and toes Negative squeeze test Skin dry NVI  Assessment and Plan:  1.  Right plantar fasciitis  Given that patient has failed conservative treatment, we fitted her today with green inserts with scaphoid pads bilaterally.  She noticed an immediate difference in her right foot.  She is still in physical therapy for her low back, we ordered physical therapy to address the plantar fasciitis as well.  Instructed her to try these new inserts, and if they become too uncomfortable she can take the scaphoid pads off.  She will follow-up in 4-6 weeks at which time we can consider custom  orthotics vs showing her how to order these on her own.   Judge Stall, DO, ATC Sports Medicine Fellow

## 2018-12-07 NOTE — Patient Instructions (Signed)
You have plantar fasciitis Take tylenol and/or aleve as needed for pain  Plantar fascia stretch for 20-30 seconds (do 3 of these) in morning Lowering/raise on a step exercises 3 x 10 once or twice a day - this is very important for long term recovery. Can add heel walks, toe walks forward and backward as well Ice heel for 15 minutes as needed. Avoid flat shoes/barefoot walking as much as possible. Arch straps have been shown to help with pain. Inserts are important (dr. Zoe Lan active series, spencos, our green insoles, custom orthotics). Steroid injection is a consideration for short term pain relief if you are struggling. We have ordered you PT for the plantar fascia If you feel the green inserts are too bothersome, peel off the white pads underneath Follow up with me in 6 weeks.

## 2018-12-08 ENCOUNTER — Ambulatory Visit: Payer: Medicare Other | Admitting: Physical Therapy

## 2018-12-08 ENCOUNTER — Encounter: Payer: Self-pay | Admitting: Physical Therapy

## 2018-12-08 DIAGNOSIS — R262 Difficulty in walking, not elsewhere classified: Secondary | ICD-10-CM

## 2018-12-08 DIAGNOSIS — M79671 Pain in right foot: Secondary | ICD-10-CM

## 2018-12-08 DIAGNOSIS — M545 Low back pain: Secondary | ICD-10-CM | POA: Diagnosis not present

## 2018-12-08 DIAGNOSIS — G8929 Other chronic pain: Secondary | ICD-10-CM

## 2018-12-08 DIAGNOSIS — M79672 Pain in left foot: Secondary | ICD-10-CM

## 2018-12-08 NOTE — Therapy (Signed)
Finger pain, right 04/27/2016  . Neck pain 11/23/2015  . Low back pain 07/11/2015  . Right shoulder pain 04/25/2015  . GERD (gastroesophageal reflux disease) 01/28/2015  . Microcytic anemia 01/28/2015  . Near syncope 11/09/2014  . Leg cramps 11/15/2013  . Vitamin D deficiency 11/15/2013  . Prediabetes 03/01/2012  . Chest pain 08/18/2011  . Anxiety and depression 05/31/2009  . Mitral valve disorder 04/15/2009  . CARPAL TUNNEL SYNDROME 01/26/2008  . OBESITY 01/04/2008  . HYPERCHOLESTEROLEMIA 06/24/2006  . Essential hypertension 06/24/2006    Angelos Wasco C. Kareema Keitt PT, DPT 12/08/18 12:06 PM   [place], [place], [address] Phone: [phone]   Fax:  [phone]  Name: [name] [name] MRN: [id number] Date of Birth: 09/03/1950  Finger pain, right 04/27/2016  . Neck pain 11/23/2015  . Low back pain 07/11/2015  . Right shoulder pain 04/25/2015  . GERD (gastroesophageal reflux disease) 01/28/2015  . Microcytic anemia 01/28/2015  . Near syncope 11/09/2014  . Leg cramps 11/15/2013  . Vitamin D deficiency 11/15/2013  . Prediabetes 03/01/2012  . Chest pain 08/18/2011  . Anxiety and depression 05/31/2009  . Mitral valve disorder 04/15/2009  . CARPAL TUNNEL SYNDROME 01/26/2008  . OBESITY 01/04/2008  . HYPERCHOLESTEROLEMIA 06/24/2006  . Essential hypertension 06/24/2006    Angelos Wasco C. Kareema Keitt PT, DPT 12/08/18 12:06 PM   [place], [place], [address] Phone: [phone]   Fax:  [phone]  Name: [name] [name] MRN: [id number] Date of Birth: 09/03/1950  [place], [place], [address] Phone: [phone]   Fax:  [phone]  Physical Therapy Treatment  Patient Details  Name: [name] [name] MRN: [id number] Date of Birth: 08/22/1950 Referring Provider (PT): Karlton Lemon, MD   Encounter Date: 12/08/2018  PT End of Session - 12/08/18 1101    Visit Number  5    Number of Visits  12    Date for PT Re-Evaluation  01/13/19    Authorization Type  [employer] MCR    PT Start Time  1025   pt arrived late   PT Stop Time  1100    PT Time Calculation (min)  35 min    Activity Tolerance  Patient tolerated treatment well    Behavior During Therapy  [facility] for tasks assessed/performed       Past Medical History:  Diagnosis Date  . Coronary artery disease   . Depression   . Hyperlipidemia   . Hypertension   . Microcytic anemia   . Mitral valve prolapse     Past Surgical History:  Procedure Laterality Date  . CARDIAC CATHETERIZATION    . CORONARY STENT INTERVENTION N/A 03/01/2017   Procedure: CORONARY STENT INTERVENTION;  Surgeon: Burnell Blanks, MD;  Location: [facility];  Service: Cardiovascular;  Laterality: N/A;  . INTRAVASCULAR PRESSURE WIRE/FFR STUDY N/A 03/01/2017   Procedure: INTRAVASCULAR PRESSURE WIRE/FFR STUDY;  Surgeon: Burnell Blanks, MD;  Location: [place] [facility];  Service: Cardiovascular;  Laterality: N/A;  . LEFT HEART CATH AND CORONARY ANGIOGRAPHY N/A 03/01/2017   Procedure: LEFT HEART CATH AND CORONARY ANGIOGRAPHY;  Surgeon: Burnell Blanks, MD;  Location: [place] [facility];  Service: Cardiovascular;  Laterality: N/A;  . PARTIAL HYSTERECTOMY      There were no vitals filed for this visit.  Subjective Assessment - 12/08/18 1028    Subjective  I could feel it up in my legs as I was trying to wear my shoes/inserts yesterday. My feet feel like they are falling out. When I woke at night to use the bathroom, there was  the sharp pain when I put my foot down. Wearing shoes and new scaphoid pad now, 0/10 pain. Reports she is doing her HEP "a little bit"    Patient Stated Goals  walk and work without pain. Pt wants to get back to riding her motorocycle.    Currently in Pain?  Yes    Pain Score  2     Pain Location  Back    Pain Orientation  Mid;Left    Pain Descriptors / Indicators  Sore    Aggravating Factors   sleeping on left side         [facility] PT Assessment - 12/08/18 0001      ROM / Strength   AROM / PROM / Strength  PROM      PROM   PROM Assessment Site  Ankle    Right/Left Ankle  Right    Right Ankle Dorsiflexion  0                   [facility] Adult PT Treatment/Exercise - 12/08/18 0001      Lumbar Exercises: Stretches   Other Lumbar Stretch Exercise  child pose center & lateral    Other Lumbar Stretch Exercise  standing reach      Manual Therapy   Manual Therapy  Joint mobilization    Joint Mobilization  thoracic PA, Lt ribmobs

## 2018-12-12 ENCOUNTER — Encounter: Payer: Self-pay | Admitting: Physical Therapy

## 2018-12-12 ENCOUNTER — Other Ambulatory Visit: Payer: Self-pay

## 2018-12-12 ENCOUNTER — Ambulatory Visit: Payer: Medicare Other | Admitting: Physical Therapy

## 2018-12-12 DIAGNOSIS — R262 Difficulty in walking, not elsewhere classified: Secondary | ICD-10-CM

## 2018-12-12 DIAGNOSIS — G8929 Other chronic pain: Secondary | ICD-10-CM

## 2018-12-12 DIAGNOSIS — M545 Low back pain: Secondary | ICD-10-CM | POA: Diagnosis not present

## 2018-12-12 DIAGNOSIS — M79671 Pain in right foot: Secondary | ICD-10-CM

## 2018-12-12 DIAGNOSIS — M79672 Pain in left foot: Secondary | ICD-10-CM

## 2018-12-12 NOTE — Therapy (Signed)
will improve bilateral hamstring flexibilty to 70 degrees in order to improve functional mobility.    Time  8    Period  Weeks    Status  On-going      PT LONG TERM GOAL #3   Title  Pt will improve FOTO to less than 32% limitation to show improved functional mobility    Baseline  44% limitation on 11/14/2018    Time  8    Period  Weeks    Status  On-going      PT LONG TERM GOAL #4   Title  Pt will be able to perform a full shift at work with pain in R heel </= 2/10.    Baseline  pt reporting her heel pain can increase to 5-6/10 while working and she requires rest breaks    Time  8    Period  Weeks    Status  On-going            Plan - 12/12/18 1248    Clinical Impression Statement  Pt reports working full shifts without as much heel pain however pain can still reach 5-6/10 pain 2 out of 3 shifts. Her back pain has improved. Continued with ankle stretching and progressed core strengthening to table top holds without increased pain.    PT Next Visit Plan  lumbopelvic strength, ankle stretching    PT Home Exercise Plan  HSS, piriformis stretch, LTR, bridge, POE, PPT, supine march, long sit &  standing DF stretch, toe yoga, toe raises, yellow tband: inver, ever, DF., supine clam green, side hip abduct       Patient will benefit from skilled therapeutic intervention in order to improve the following deficits and impairments:  Abnormal gait, Decreased activity tolerance, Decreased endurance, Decreased range of motion, Decreased strength, Hypomobility, Difficulty walking, Impaired flexibility, Increased muscle spasms, Pain, Postural dysfunction, Improper body mechanics  Visit Diagnosis: Chronic bilateral low back pain without sciatica  Pain in right foot  Difficulty in walking, not elsewhere classified  Pain in left foot     Problem List Patient Active Problem List   Diagnosis Date Noted  . Plantar fasciitis of left foot 10/24/2018  . Neuropathy due to medical condition ([place]) 01/31/2018  . Difficulty sleeping 01/31/2018  . Unstable angina ([place])   . Heel pain, bilateral 09/07/2016  . Orthostatic hypotension 09/07/2016  . Right hip pain 08/19/2016  . Lumbar strain 04/27/2016  . Left knee pain 04/27/2016  . Finger pain, right 04/27/2016  . Neck pain 11/23/2015  . Low back pain 07/11/2015  . Right shoulder pain 04/25/2015  . GERD (gastroesophageal reflux disease) 01/28/2015  . Microcytic anemia 01/28/2015  . Near syncope 11/09/2014  . Leg cramps 11/15/2013  . Vitamin D deficiency 11/15/2013  . Prediabetes 03/01/2012  . Chest pain 08/18/2011  . Anxiety and depression 05/31/2009  . Mitral valve disorder 04/15/2009  . CARPAL TUNNEL SYNDROME 01/26/2008  . OBESITY 01/04/2008  . HYPERCHOLESTEROLEMIA 06/24/2006  . Essential hypertension 06/24/2006    [name], PTA 12/12/2018, 1:11 PM  [facility] [address] [place], [place], [address] Phone: [phone]   Fax:  [phone]  Name: [name] [name] MRN: [id number] Date of Birth: Sep 01, 1950  [place], [place], [address] Phone: [phone]   Fax:  [phone]  Physical Therapy Treatment  Patient Details  Name: [name] [name] MRN: [id number] Date of Birth: 24-May-1950 Referring Provider (PT): Karlton Lemon, MD   Encounter Date: 12/12/2018  PT End of Session - 12/12/18 1247    Visit Number  6    Number of Visits  12    Date for PT Re-Evaluation  01/13/19    Authorization Type  UHC MCR    PT Start Time  [id number]   15 minutes late   PT Stop Time  [id number]    PT Time Calculation (min)  28 min       Past Medical History:  Diagnosis Date  . Coronary artery disease   . Depression   . Hyperlipidemia   . Hypertension   . Microcytic anemia   . Mitral valve prolapse     Past Surgical History:  Procedure Laterality Date  . CARDIAC CATHETERIZATION    . CORONARY STENT INTERVENTION N/A 03/01/2017   Procedure: CORONARY STENT INTERVENTION;  Surgeon: Burnell Blanks, MD;  Location: [place] [facility];  Service: Cardiovascular;  Laterality: N/A;  . INTRAVASCULAR PRESSURE WIRE/FFR STUDY N/A 03/01/2017   Procedure: INTRAVASCULAR PRESSURE WIRE/FFR STUDY;  Surgeon: Burnell Blanks, MD;  Location: [facility];  Service: Cardiovascular;  Laterality: N/A;  . LEFT HEART CATH AND CORONARY ANGIOGRAPHY N/A 03/01/2017   Procedure: LEFT HEART CATH AND CORONARY ANGIOGRAPHY;  Surgeon: Burnell Blanks, MD;  Location: [facility];  Service: Cardiovascular;  Laterality: N/A;  . PARTIAL HYSTERECTOMY      There were no vitals filed for this visit.  Subjective Assessment - 12/12/18 1247    Subjective  Not really having any pain today.    Currently in Pain?  No/denies                       [facility] Adult PT Treatment/Exercise - 12/12/18 0001      Lumbar Exercises: Stretches   Active Hamstring Stretch  3 reps;30 seconds    Active Hamstring Stretch Limitations  supine strap     Lower Trunk Rotation  10 seconds    Piriformis Stretch  30 seconds;2 reps    Piriformis Stretch Limitations  push pull      Lumbar Exercises: Aerobic   Nustep  L5 UE & LE 5 min      Lumbar Exercises: Supine   Pelvic Tilt  15 reps    Clam  20 reps    Bridge with clamshell  15 reps    Bridge with Ball Squeeze Limitations  green     Other Supine Lumbar Exercises  Table tops 10 sec x5       Lumbar Exercises: Sidelying   Hip Abduction  --      Ankle Exercises: Stretches   Soleus Stretch  3 reps   on slant board   Slant Board Stretch  3 reps                  PT Long Term Goals - 12/12/18 1304      PT LONG TERM GOAL #1   Title  Pt will be I and compliant with HEP and progression.    Time  6    Period  Weeks    Status  On-going      PT LONG TERM GOAL #2   Title  Pt

## 2018-12-14 ENCOUNTER — Encounter: Payer: Self-pay | Admitting: Physical Therapy

## 2018-12-14 ENCOUNTER — Other Ambulatory Visit: Payer: Self-pay

## 2018-12-14 ENCOUNTER — Ambulatory Visit: Payer: Medicare Other | Admitting: Physical Therapy

## 2018-12-14 DIAGNOSIS — M545 Low back pain: Secondary | ICD-10-CM | POA: Diagnosis not present

## 2018-12-14 DIAGNOSIS — R262 Difficulty in walking, not elsewhere classified: Secondary | ICD-10-CM

## 2018-12-14 DIAGNOSIS — G8929 Other chronic pain: Secondary | ICD-10-CM

## 2018-12-14 DIAGNOSIS — M79672 Pain in left foot: Secondary | ICD-10-CM

## 2018-12-14 DIAGNOSIS — M79671 Pain in right foot: Secondary | ICD-10-CM

## 2018-12-14 NOTE — Therapy (Signed)
[facility] [address] [place], [place], [address] Phone: [phone]   Fax:  [phone]  Physical Therapy Treatment  Patient Details  Name: [name] [name] MRN: [id number] Date of Birth: 1950-12-21 Referring Provider (PT): Norton Blizzard, MD   Encounter Date: 12/14/2018  PT End of Session - 12/14/18 1243    Visit Number  7    Number of Visits  12    Date for PT Re-Evaluation  01/13/19    Authorization Type  UHC MCR    PT Start Time  1242   12 minutes late   PT Stop Time  1314    PT Time Calculation (min)  32 min       Past Medical History:  Diagnosis Date  . Coronary artery disease   . Depression   . Hyperlipidemia   . Hypertension   . Microcytic anemia   . Mitral valve prolapse     Past Surgical History:  Procedure Laterality Date  . CARDIAC CATHETERIZATION    . CORONARY STENT INTERVENTION N/A 03/01/2017   Procedure: CORONARY STENT INTERVENTION;  Surgeon: Kathleene Hazel, MD;  [facility]: [facility];  Service: Cardiovascular;  Laterality: N/A;  . INTRAVASCULAR PRESSURE WIRE/FFR STUDY N/A 03/01/2017   Procedure: INTRAVASCULAR PRESSURE WIRE/FFR STUDY;  Surgeon: Kathleene Hazel, MD;  Location: [facility];  Service: Cardiovascular;  Laterality: N/A;  . LEFT HEART CATH AND CORONARY ANGIOGRAPHY N/A 03/01/2017   Procedure: LEFT HEART CATH AND CORONARY ANGIOGRAPHY;  Surgeon: Kathleene Hazel, MD;  Location: [facility];  Service: Cardiovascular;  Laterality: N/A;  . PARTIAL HYSTERECTOMY      There were no vitals filed for this visit.  Subjective Assessment - 12/14/18 1243    Subjective  No pain today. Did exercises before getting out of bed and did not have pain upon standing.    Currently in Pain?  No/denies                       [facility] Adult PT Treatment/Exercise - 12/14/18 0001      Lumbar Exercises: Stretches   Active Hamstring Stretch  3 reps;30  seconds    Active Hamstring Stretch Limitations  supine strap    Lower Trunk Rotation  10 seconds    Piriformis Stretch  30 seconds;2 reps    Piriformis Stretch Limitations  push pull      Lumbar Exercises: Aerobic   Nustep  L5 UE & LE 5 min      Lumbar Exercises: Standing   Functional Squats  10 reps    Functional Squats Limitations  focus heels down       Lumbar Exercises: Supine   Clam  20 reps    Clam Limitations  level 3 , green     Bridge with clamshell  15 reps    Bridge with [employer] Limitations  green     Other Supine Lumbar Exercises  Table tops 10 sec x5       Lumbar Exercises: Sidelying   Hip Abduction  Right;Left;10 reps   2 sets     Ankle Exercises: Stretches   Soleus Stretch  3 reps   on slant board   Slant Board Stretch  3 reps                  PT Long Term Goals - 12/12/18 1304      PT LONG TERM GOAL #1   Title  Pt will be I and compliant with HEP and progression.    Time  6    Period  Weeks    Status  On-going      PT LONG TERM GOAL #2   Title  Pt will improve bilateral hamstring flexibilty to 70 degrees in order to improve functional mobility.    Time  8    Period  Weeks    Status  On-going      PT LONG TERM GOAL #3   Title  Pt will improve FOTO to less than 32% limitation to show improved functional mobility    Baseline  44% limitation on 11/14/2018    Time  8    Period  Weeks    Status  On-going      PT LONG TERM GOAL #4   Title  Pt will be able to perform a full shift at work with pain in R heel </= 2/10.    Baseline  pt reporting her heel pain can increase to 5-6/10 while working and she requires rest breaks    Time  8    Period  Weeks    Status  On-going            Plan - 12/14/18 1304    Clinical Impression Statement  Pt reports improved pain with first rise in the morning when she does HEP first. Continued with core and ankle stretching. Session limited again due to tardiness.    PT Next Visit Plan   lumbopelvic strength, ankle stretching    PT Home Exercise Plan  HSS, piriformis stretch, LTR, bridge, POE, PPT, supine march, long sit & standing DF stretch, toe yoga, toe raises, yellow tband: inver, ever, DF., supine clam green, side hip abduct       Patient will benefit from skilled therapeutic intervention in order to improve the following deficits and impairments:  Abnormal gait, Decreased activity tolerance, Decreased endurance, Decreased range of motion, Decreased strength, Hypomobility, Difficulty walking, Impaired flexibility, Increased muscle spasms, Pain, Postural dysfunction, Improper body mechanics  Visit Diagnosis: Chronic bilateral low back pain without sciatica  Pain in right foot  Difficulty in walking, not elsewhere classified  Pain in left foot     Problem List Patient Active Problem List   Diagnosis Date Noted  . Plantar fasciitis of left foot 10/24/2018  . Neuropathy due to medical condition ([facility]) 01/31/2018  . Difficulty sleeping 01/31/2018  . Unstable angina ([facility])   . Heel pain, bilateral 09/07/2016  . Orthostatic hypotension 09/07/2016  . Right hip pain 08/19/2016  . Lumbar strain 04/27/2016  . Left knee pain 04/27/2016  . Finger pain, right 04/27/2016  . Neck pain 11/23/2015  . Low back pain 07/11/2015  . Right shoulder pain 04/25/2015  . GERD (gastroesophageal reflux disease) 01/28/2015  . Microcytic anemia 01/28/2015  . Near syncope 11/09/2014  . Leg cramps 11/15/2013  . Vitamin D deficiency 11/15/2013  . Prediabetes 03/01/2012  . Chest pain 08/18/2011  . Anxiety and depression 05/31/2009  . Mitral valve disorder 04/15/2009  . CARPAL TUNNEL SYNDROME 01/26/2008  . OBESITY 01/04/2008  . HYPERCHOLESTEROLEMIA 06/24/2006  . Essential hypertension 06/24/2006    Sherrie Mustache, PTA 12/14/2018, 1:06 PM  [facility] [address] [place], [place], [address] Phone: [phone]   Fax:   [phone]  Name: [name] [name] MRN: [id number] Date of Birth: 1950-04-03

## 2018-12-19 ENCOUNTER — Encounter: Payer: Medicare Other | Admitting: Physical Therapy

## 2018-12-21 ENCOUNTER — Other Ambulatory Visit: Payer: Self-pay

## 2018-12-21 ENCOUNTER — Ambulatory Visit: Payer: Medicare Other | Admitting: Physical Therapy

## 2018-12-21 ENCOUNTER — Encounter: Payer: Self-pay | Admitting: Physical Therapy

## 2018-12-21 DIAGNOSIS — M79671 Pain in right foot: Secondary | ICD-10-CM

## 2018-12-21 DIAGNOSIS — M545 Low back pain: Secondary | ICD-10-CM | POA: Diagnosis not present

## 2018-12-21 DIAGNOSIS — M79672 Pain in left foot: Secondary | ICD-10-CM

## 2018-12-21 DIAGNOSIS — G8929 Other chronic pain: Secondary | ICD-10-CM

## 2018-12-21 DIAGNOSIS — R262 Difficulty in walking, not elsewhere classified: Secondary | ICD-10-CM

## 2018-12-21 NOTE — Therapy (Signed)
disease) 01/28/2015  . Microcytic anemia 01/28/2015  . Near syncope 11/09/2014  . Leg cramps 11/15/2013  . Vitamin D deficiency 11/15/2013  . Prediabetes 03/01/2012  . Chest pain 08/18/2011  . Anxiety and depression 05/31/2009  . Mitral valve disorder 04/15/2009  . CARPAL TUNNEL SYNDROME 01/26/2008  . OBESITY 01/04/2008  . HYPERCHOLESTEROLEMIA 06/24/2006  . Essential hypertension 06/24/2006    Jeremey Bascom C. Maham Quintin PT, DPT 12/21/18 2:21 PM   [facility] [address] [place], [place], [address] Phone: [phone]   Fax:  [phone]  Name: [name] [name] MRN: [id number] Date of Birth: September 01, 1950  [place], [place], [address] Phone: [phone]   Fax:  [phone]  Physical Therapy Treatment  Patient Details  Name: [name] [name] MRN: [id number] Date of Birth: 1950/07/25 Referring Provider (PT): Karlton Lemon, MD   Encounter Date: 12/21/2018  PT End of Session - 12/21/18 1417    Visit Number  8    Number of Visits  12    Date for PT Re-Evaluation  01/13/19    Authorization Type  UHC MCR    PT Start Time  [id number]   pt arrived late   PT Stop Time  1416    PT Time Calculation (min)  34 min    Activity Tolerance  Patient tolerated treatment well    Behavior During Therapy  [facility] for tasks assessed/performed       Past Medical History:  Diagnosis Date  . Coronary artery disease   . Depression   . Hyperlipidemia   . Hypertension   . Microcytic anemia   . Mitral valve prolapse     Past Surgical History:  Procedure Laterality Date  . CARDIAC CATHETERIZATION    . CORONARY STENT INTERVENTION N/A 03/01/2017   Procedure: CORONARY STENT INTERVENTION;  Surgeon: Burnell Blanks, MD;  Location: [place] [facility];  Service: Cardiovascular;  Laterality: N/A;  . INTRAVASCULAR PRESSURE WIRE/FFR STUDY N/A 03/01/2017   Procedure: INTRAVASCULAR PRESSURE WIRE/FFR STUDY;  Surgeon: Burnell Blanks, MD;  Location: [place] [facility];  Service: Cardiovascular;  Laterality: N/A;  . LEFT HEART CATH AND CORONARY ANGIOGRAPHY N/A 03/01/2017   Procedure: LEFT HEART CATH AND CORONARY ANGIOGRAPHY;  Surgeon: Burnell Blanks, MD;  Location: [place] [facility];  Service: Cardiovascular;  Laterality: N/A;  . PARTIAL HYSTERECTOMY      There were no vitals filed for this visit.  Subjective Assessment - 12/21/18 1345    Subjective  My heel was killing me when I went to bed last tight and this morning. I did some stretches and exercises this morning which made it feel better. I was getting muscle cramps in  my back. I felt cramps trying to come when I did exercises with my legs. My back is feeling better but the heel keeps carrying on. Cannot wear these inserts, they make my feet so sore.    Patient Stated Goals  walk and work without pain. Pt wants to get back to riding her motorocycle.    Currently in Pain?  Yes    Pain Score  3     Pain Location  Heel    Pain Orientation  Right    Pain Descriptors / Indicators  Sharp    Aggravating Factors   AM putting foot down    Pain Relieving Factors  stretches                       [facility] Adult PT Treatment/Exercise - 12/21/18 0001      Modalities   Modalities  Iontophoresis      Iontophoresis   Type of Iontophoresis  Dexamethasone    Location  heel    Dose  1cc    Time  6 hr wear      Manual Therapy   Soft tissue mobilization  gastroc/soleus , plantar fascia      Ankle Exercises: Stretches   Other Stretch  at work- hamstrings, gastroc, piriformis, quads             PT Education -  disease) 01/28/2015  . Microcytic anemia 01/28/2015  . Near syncope 11/09/2014  . Leg cramps 11/15/2013  . Vitamin D deficiency 11/15/2013  . Prediabetes 03/01/2012  . Chest pain 08/18/2011  . Anxiety and depression 05/31/2009  . Mitral valve disorder 04/15/2009  . CARPAL TUNNEL SYNDROME 01/26/2008  . OBESITY 01/04/2008  . HYPERCHOLESTEROLEMIA 06/24/2006  . Essential hypertension 06/24/2006    Jeremey Bascom C. Maham Quintin PT, DPT 12/21/18 2:21 PM   [facility] [address] [place], [place], [address] Phone: [phone]   Fax:  [phone]  Name: [name] [name] MRN: [id number] Date of Birth: September 01, 1950

## 2018-12-29 ENCOUNTER — Ambulatory Visit
Admission: RE | Admit: 2018-12-29 | Discharge: 2018-12-29 | Disposition: A | Discharge status: Home / Self Care | Payer: Medicare Other | Source: Ambulatory Visit | Attending: Family Medicine | Admitting: Family Medicine

## 2018-12-29 ENCOUNTER — Other Ambulatory Visit: Payer: Self-pay

## 2018-12-29 DIAGNOSIS — M8580 Other specified disorders of bone density and structure, unspecified site: Secondary | ICD-10-CM

## 2019-01-03 ENCOUNTER — Ambulatory Visit: Payer: Medicare Other | Attending: Family Medicine | Admitting: Physical Therapy

## 2019-01-03 ENCOUNTER — Encounter: Payer: Self-pay | Admitting: Physical Therapy

## 2019-01-03 ENCOUNTER — Other Ambulatory Visit: Payer: Self-pay

## 2019-01-03 DIAGNOSIS — M79671 Pain in right foot: Secondary | ICD-10-CM | POA: Insufficient documentation

## 2019-01-03 DIAGNOSIS — M545 Low back pain, unspecified: Secondary | ICD-10-CM

## 2019-01-03 DIAGNOSIS — R262 Difficulty in walking, not elsewhere classified: Secondary | ICD-10-CM | POA: Diagnosis present

## 2019-01-03 DIAGNOSIS — G8929 Other chronic pain: Secondary | ICD-10-CM | POA: Insufficient documentation

## 2019-01-03 DIAGNOSIS — M79672 Pain in left foot: Secondary | ICD-10-CM | POA: Insufficient documentation

## 2019-01-03 NOTE — Therapy (Signed)
[facility] [address] [place], [place], [address] Phone: [phone]   Fax:  [phone]  Physical Therapy Treatment  Patient Details  Name: [name] [name] MRN: [id number] Date of Birth: [birth year] Referring Provider (PT): Norton Blizzard, MD   Encounter Date: 01/03/2019  PT End of Session - 01/03/19 1248    Visit Number  9    Number of Visits  12    Date for PT Re-Evaluation  01/13/19    Authorization Type  [employer] MCR    PT Start Time  1244    PT Stop Time  1330    PT Time Calculation (min)  46 min       Past Medical History:  Diagnosis Date  . Coronary artery disease   . Depression   . Hyperlipidemia   . Hypertension   . Microcytic anemia   . Mitral valve prolapse     Past Surgical History:  Procedure Laterality Date  . CARDIAC CATHETERIZATION    . CORONARY STENT INTERVENTION N/A 03/01/2017   Procedure: CORONARY STENT INTERVENTION;  Surgeon: Kathleene Hazel, MD;  Location: [facility] INV[facility]IVE CV [facility];  Service: Cardiovascular;  Laterality: N/A;  . INTRAVASCULAR PRESSURE WIRE/FFR STUDY N/A 03/01/2017   Procedure: INTRAVASCULAR PRESSURE WIRE/FFR STUDY;  Surgeon: Kathleene Hazel, MD;  Location: [facility];  Service: Cardiovascular;  Laterality: N/A;  . LEFT HEART CATH AND CORONARY ANGIOGRAPHY N/A 03/01/2017   Procedure: LEFT HEART CATH AND CORONARY ANGIOGRAPHY;  Surgeon: Kathleene Hazel, MD;  Location: [facility];  Service: Cardiovascular;  Laterality: N/A;  . PARTIAL HYSTERECTOMY      There were no vitals filed for this visit.  Subjective Assessment - 01/03/19 1254    Subjective  My pack is just a little tense.    Currently in Pain?  Yes    Pain Score  0-No pain    Pain Location  Heel    Pain Score  0   back        [facility] PT Assessment - 01/03/19 0001      Observation/Other Assessments   Focus on Therapeutic Outcomes (FOTO)   31%  limited       Flexibility   Hamstrings  Rt  85 Lt 80                   [facility] Adult PT Treatment/Exercise - 01/03/19 0001      Lumbar Exercises: Stretches   Active Hamstring Stretch  3 reps;30 seconds    Active Hamstring Stretch Limitations  supine strap      Lumbar Exercises: Aerobic   Nustep  L5 UE & LE 5 min      Moist Heat Therapy   Number Minutes Moist Heat  15 Minutes    Moist Heat Location  Lumbar Spine      Electrical Stimulation   Electrical Stimulation Location  Lumbar    Electrical Stimulation Action  IFC x 15 min    Electrical Stimulation Parameters  13ma    Electrical Stimulation Goals  Pain      Iontophoresis   Type of Iontophoresis  Dexamethasone    Location  heel    Dose  1cc    Time  6 hr wear      Ankle Exercises: Stretches   Other Stretch  at work- hamstrings, gastroc, piriformis, quads                  PT Long Term Goals -  01/03/19 1252      PT LONG TERM GOAL #1   Title  Pt will be I and compliant with HEP and progression.    Time  6    Period  Weeks    Status  Achieved      PT LONG TERM GOAL #2   Title  Pt will improve bilateral hamstring flexibilty to 70 degrees in order to improve functional mobility.    Baseline  80/85 degrees    Time  8    Period  Weeks    Status  Achieved      PT LONG TERM GOAL #3   Title  Pt will improve FOTO to less than 32% limitation to show improved functional mobility    Baseline  31% limited on 01/03/19    Time  8    Period  Weeks    Status  Achieved      PT LONG TERM GOAL #4   Title  Pt will be able to perform a full shift at work with pain in R heel </= 2/10.    Baseline  doesnt hurt every day, sometimes hurts last hour of work up to 7/10    Time  8    Period  Weeks    Status  Partially Met            Plan - 01/03/19 1248    Clinical Impression Statement  Pt arrives 15 minutes late and requests IFC with HMP to relax her back and another ionto treatment for her heel. She has tried occassionally stretching at work and  reports her job is too face paced to stop and stretch alot. She felt heel pain yesterday and hamstring buttock pain. She was able to decrease her pain with a massage chair she has borrowed. She also purchased ad advertized muscle cream called "therawork" that helped with muscle cramps in shin and foot on right. Reviewed her work stretches and performed IFC per her request as well as ionto which she did say helped for several days. She has met most LTGs.    PT Next Visit Plan  outcome of ionto? did she do stretches at work?    PT Home Exercise Plan  HSS, piriformis stretch, LTR, bridge, POE, PPT, supine march, long sit & standing DF stretch, toe yoga, toe raises, yellow tband: inver, ever, DF., supine clam green, side hip abduct       Patient will benefit from skilled therapeutic intervention in order to improve the following deficits and impairments:  Abnormal gait, Decreased activity tolerance, Decreased endurance, Decreased range of motion, Decreased strength, Hypomobility, Difficulty walking, Impaired flexibility, Increased muscle spasms, Pain, Postural dysfunction, Improper body mechanics  Visit Diagnosis: Chronic bilateral low back pain without sciatica  Pain in right foot  Difficulty in walking, not elsewhere classified  Pain in left foot     Problem List Patient Active Problem List   Diagnosis Date Noted  . Plantar fasciitis of left foot 10/24/2018  . Neuropathy due to medical condition (HCC) 01/31/2018  . Difficulty sleeping 01/31/2018  . Unstable angina (HCC)   . Heel pain, bilateral 09/07/2016  . Orthostatic hypotension 09/07/2016  . Right hip pain 08/19/2016  . Lumbar strain 04/27/2016  . Left knee pain 04/27/2016  . Finger pain, right 04/27/2016  . Neck pain 11/23/2015  . Low back pain 07/11/2015  . Right shoulder pain 04/25/2015  . GERD (gastroesophageal reflux disease) 01/28/2015  . Microcytic anemia 01/28/2015  . Near syncope 11/09/2014  .  Leg cramps 11/15/2013   . Vitamin D deficiency 11/15/2013  . Prediabetes 03/01/2012  . Chest pain 08/18/2011  . Anxiety and depression 05/31/2009  . Mitral valve disorder 04/15/2009  . CARPAL TUNNEL SYNDROME 01/26/2008  . OBESITY 01/04/2008  . HYPERCHOLESTEROLEMIA 06/24/2006  . Essential hypertension 06/24/2006    Sherrie Mustache, PTA 01/03/2019, 1:47 PM  [facility] [address] [place], [place], [address] Phone: [phone]   Fax:  [phone]  Name: KEISHA EARLS MRN: [id number] Date of Birth: 06-Apr-1950

## 2019-01-04 ENCOUNTER — Encounter: Payer: Self-pay | Admitting: Family Medicine

## 2019-01-04 ENCOUNTER — Ambulatory Visit (INDEPENDENT_AMBULATORY_CARE_PROVIDER_SITE_OTHER): Payer: Medicare Other | Admitting: Family Medicine

## 2019-01-04 VITALS — BP 128/64 | Ht 62.0 in | Wt 175.0 lb

## 2019-01-04 DIAGNOSIS — M722 Plantar fascial fibromatosis: Secondary | ICD-10-CM | POA: Diagnosis not present

## 2019-01-04 DIAGNOSIS — M79644 Pain in right finger(s): Secondary | ICD-10-CM | POA: Diagnosis not present

## 2019-01-04 NOTE — Progress Notes (Signed)
[facility] [address] [place], [place] [address] Phone: [phone] Fax: [phone]   Patient Name: [name] [name] Date of Birth: [birth year] Medical Record Number: [id number] Gender: female Date of Encounter: 01/04/2019  CC: Right plantar fasciitis, right 3rd finger pain  9/9: Pt is here to follow-up for her right plantar fasciitis.  She was last seen 6 weeks ago at which time she had an injection to the plantar fascia.  She feels like the injection did not offer her any benefit.  She did go to [employer] and purchased new [place] shoes to help with arch support and discomfort.  She still has pain worse in the morning and after a long shift at work on her feet.  She denies any new trauma, swelling, weakness, numbness, skin changes.  10/7: Patient reports her right plantar fasciitis is much better since starting physical therapy. Not wearing her green insoles today but these have helped. Doing home exercises and stretches also. Reporting pain in right middle finger now that started a couple weeks ago that can be sharp, localized to PIP joint circumferentially. Associated locking but no numbness. No skin changes. No injury.    Past Medical History:  Diagnosis Date  . Coronary artery disease   . Depression   . Hyperlipidemia   . Hypertension   . Microcytic anemia   . Mitral valve prolapse     Current Outpatient Medications on File Prior to Visit  Medication Sig Dispense Refill  . amLODipine (NORVASC) 5 MG tablet TAKE 1 TABLET BY MOUTH EVERY DAY 90 tablet 3  . aspirin 81 MG tablet Take 81 mg by mouth daily.    [name] atorvastatin (LIPITOR) 40 MG tablet Take 1 tablet (40 mg total) by mouth daily. 30 tablet 3  . cyclobenzaprine (FLEXERIL) 10 MG tablet TAKE 1 TABLET (10 MG TOTAL) BY MOUTH DAILY AS NEEDED FOR MUSCLE SPASMS. 30 tablet 1  . ferrous sulfate 325 (65 FE) MG tablet TAKE 1 TABLET BY MOUTH EVERY DAY WITH BREAKFAST 90 tablet 1  .  fluticasone (FLONASE) 50 MCG/ACT nasal spray Place 1 spray daily into both nostrils.    [name] gabapentin (NEURONTIN) 100 MG capsule TAKE 2 CAPSULES (200 MG TOTAL) BY MOUTH AT BEDTIME. 180 capsule 1  . metoprolol tartrate (LOPRESSOR) 25 MG tablet Take 0.5 tablets (12.5 mg total) by mouth 2 (two) times daily. 60 tablet 6  . nitroGLYCERIN (NITROSTAT) 0.4 MG SL tablet PLACE 1 TABLET (0.4 MG TOTAL) UNDER THE TONGUE EVERY 5 (FIVE) MINUTES AS NEEDED FOR CHEST PAIN. 25 tablet 7  . pantoprazole (PROTONIX) 40 MG tablet TAKE 1 TABLET BY MOUTH TWICE A DAY 180 tablet 3  . sertraline (ZOLOFT) 100 MG tablet TAKE 1 TABLET BY MOUTH EVERY DAY 90 tablet 0   No current facility-administered medications on file prior to visit.     Past Surgical History:  Procedure Laterality Date  . CARDIAC CATHETERIZATION    . CORONARY STENT INTERVENTION N/A 03/01/2017   Procedure: CORONARY STENT INTERVENTION;  Surgeon: Kathleene Hazel, MD;  [facility]: [facility];  Service: Cardiovascular;  Laterality: N/A;  . INTRAVASCULAR PRESSURE WIRE/FFR STUDY N/A 03/01/2017   Procedure: INTRAVASCULAR PRESSURE WIRE/FFR STUDY;  Surgeon: Kathleene Hazel, MD;  Location: [facility];  Service: Cardiovascular;  Laterality: N/A;  . LEFT HEART CATH AND CORONARY ANGIOGRAPHY N/A 03/01/2017   Procedure: LEFT HEART CATH AND CORONARY ANGIOGRAPHY;  Surgeon: Kathleene Hazel, MD;  [facility]: [facility];  Service: Cardiovascular;  Laterality: N/A;  . PARTIAL HYSTERECTOMY      No Known Allergies  Social History   Socioeconomic History  . Marital status: Married    Spouse name: Not on file  . Number of children: 3  . Years of education: Not on file  . Highest education level: Not on file  Occupational History    Comment: Retired  Engineer, production  . Financial resource strain: Not on file  . Food insecurity    Worry: Not on file    Inability: Not on file  . Transportation needs    Medical: Not on file     Non-medical: Not on file  Tobacco Use  . Smoking status: Former Smoker    Packs/day: 0.50    Years: 20.00    Pack years: 10.00    Quit date: 07/30/2004    Years since quitting: 14.4  . Smokeless tobacco: Never Used  Substance and Sexual Activity  . Alcohol use: Yes    Alcohol/week: 0.0 standard drinks    Comment: Occasional  . Drug use: No  . Sexual activity: Yes    Partners: Male  Lifestyle  . Physical activity    Days per week: 4 days    Minutes per session: 30 min  . Stress: Not at all  Relationships  . Social Musician on phone: Not on file    Gets together: Not on file    Attends religious service: Not on file    Active member of club or organization: Not on file    Attends meetings of clubs or organizations: Not on file    Relationship status: Not on file  . Intimate partner violence    Fear of current or ex partner: Not on file    Emotionally abused: Not on file    Physically abused: Not on file    Forced sexual activity: Not on file  Other Topics Concern  . Not on file  Social History Narrative  . Not on file    Family History  Problem Relation Age of Onset  . Heart disease Mother        CHF  . Diabetes Mother   . Hypertension Mother   . Cancer Father   . Hypertension Sister   . Pancreatitis Sister   . Dementia Sister     BP 128/64   Ht 5\' 2"  (1.575 m)   Wt 175 lb (79.4 kg)   BMI 32.01 kg/m   ROS: See HPI  Objective: Right foot/ankle: No gross deformity, swelling, ecchymoses FROM with 5/5 strength all directions TTP within plantar fascia proximally. Negative ant drawer and talar tilt.   Negative calcaneal squeeze. Thompsons test negative. NV intact distally.  Right hand: No deformity.  No nodule over a1 pulley 3rd digit.  No malrotation or angulation. FROM with 5/5 strength PIP, MCP, DIP joints flexion and extension. Collateral ligaments intact TTP circumferentially about 3rd PIP.  Less TTP over a1 pulley of this digit. NVI  distally.  Assessment and Plan:  1.  Right plantar fasciitis - Improving.  Continue with home exercises, physical therapy, sports insoles with scaphoid pads - reports a little too much support so she will bring these in to grind the scaphoid pads down some.  F/u in 6 weeks.  Tylenol if needed.  2. Right 3rd digit pain - consistent with arthropathy, less likely small component of trigger finger.  Band-aid splinting shown.  voltaren gel.  F/u in 6 weeks.

## 2019-01-04 NOTE — Patient Instructions (Signed)
Try voltaren gel over this joint of your finger. Do the band-aid trick I showed you to help splint this some. Transition from physical therapy to your home exercises and do these until pain in your foot has resolved. Bring the green inserts in and we will shave down the white pad on it so these are more comfortable for you. Follow up with me in 6 weeks.

## 2019-01-06 ENCOUNTER — Ambulatory Visit: Payer: Medicare Other | Admitting: Physical Therapy

## 2019-01-09 ENCOUNTER — Ambulatory Visit: Payer: Medicare Other | Admitting: Physical Therapy

## 2019-01-09 ENCOUNTER — Encounter: Payer: Self-pay | Admitting: Physical Therapy

## 2019-01-09 ENCOUNTER — Other Ambulatory Visit: Payer: Self-pay

## 2019-01-09 DIAGNOSIS — M79672 Pain in left foot: Secondary | ICD-10-CM

## 2019-01-09 DIAGNOSIS — G8929 Other chronic pain: Secondary | ICD-10-CM

## 2019-01-09 DIAGNOSIS — R262 Difficulty in walking, not elsewhere classified: Secondary | ICD-10-CM

## 2019-01-09 DIAGNOSIS — M545 Low back pain, unspecified: Secondary | ICD-10-CM

## 2019-01-09 DIAGNOSIS — M79671 Pain in right foot: Secondary | ICD-10-CM

## 2019-01-09 NOTE — Therapy (Addendum)
[facility] [address] [place], [place], [address] Phone: [phone]   Fax:  [phone]  Physical Therapy Treatment/Discharge  Patient Details  Name: [name] [name] MRN: [id number] Date of Birth: [birth year] Referring Provider (PT): Norton Blizzard, MD   Encounter Date: 01/09/2019  PT End of Session - 01/09/19 1246    Visit Number  10    Number of Visits  12    Date for PT Re-Evaluation  01/13/19    PT Start Time  1157    PT Stop Time  1230    PT Time Calculation (min)  33 min       Past Medical History:  Diagnosis Date  . Coronary artery disease   . Depression   . Hyperlipidemia   . Hypertension   . Microcytic anemia   . Mitral valve prolapse     Past Surgical History:  Procedure Laterality Date  . CARDIAC CATHETERIZATION    . CORONARY STENT INTERVENTION N/A 03/01/2017   Procedure: CORONARY STENT INTERVENTION;  Surgeon: Kathleene Hazel, MD;  [facility];  Service: Cardiovascular;  Laterality: N/A;  . INTRAVASCULAR PRESSURE WIRE/FFR STUDY N/A 03/01/2017   Procedure: INTRAVASCULAR PRESSURE WIRE/FFR STUDY;  Surgeon: Kathleene Hazel, MD;  [facility];  Service: Cardiovascular;  Laterality: N/A;  . LEFT HEART CATH AND CORONARY ANGIOGRAPHY N/A 03/01/2017   Procedure: LEFT HEART CATH AND CORONARY ANGIOGRAPHY;  Surgeon: Kathleene Hazel, MD;  [facility]: [facility];  Service: Cardiovascular;  Laterality: N/A;  . PARTIAL HYSTERECTOMY      There were no vitals filed for this visit.  Subjective Assessment - 01/09/19 1245    Subjective  Hips were hurting over the weekend so I did my squats and band exercises. The weather was rainy all weekend so the hips continued to hurt. The heel was good this weekend.    Currently in Pain?  No/denies                       [facility] Adult PT Treatment/Exercise - 01/09/19 0001      Lumbar Exercises: Stretches   Active Hamstring Stretch  3 reps;30 seconds    Active Hamstring Stretch Limitations  seated    Lower Trunk Rotation  10 seconds    Piriformis Stretch  30 seconds;2 reps    Piriformis Stretch Limitations  push pull, seated       Lumbar Exercises: Aerobic   Nustep  L5 UE & LE 5 min      Lumbar Exercises: Standing   Functional Squats  10 reps    Functional Squats Limitations  focus heels down       Lumbar Exercises: Supine   Bridge with clamshell  15 reps    Bridge with Ball Squeeze Limitations  blue      Iontophoresis   Type of Iontophoresis  Dexamethasone    Location  heel    Dose  1cc    Time  6 hr wear      Ankle Exercises: Stretches   Other Stretch  at work- hamstrings, gastroc, piriformis, quads                  PT Long Term Goals - 01/03/19 1252      PT LONG TERM GOAL #1   Title  Pt will be I and compliant with HEP and progression.    Time  6    Period  Weeks  Status  Achieved      PT LONG TERM GOAL #2   Title  Pt will improve bilateral hamstring flexibilty to 70 degrees in order to improve functional mobility.    Baseline  80/85 degrees    Time  8    Period  Weeks    Status  Achieved      PT LONG TERM GOAL #3   Title  Pt will improve FOTO to less than 32% limitation to show improved functional mobility    Baseline  31% limited on 01/03/19    Time  8    Period  Weeks    Status  Achieved      PT LONG TERM GOAL #4   Title  Pt will be able to perform a full shift at work with pain in R heel </= 2/10.    Baseline  doesnt hurt every day, sometimes hurts last hour of work up to 7/10    Time  8    Period  Weeks    Status  Partially Met            Plan - 01/09/19 1354    Clinical Impression Statement  Pt arrives nearly 15 minutes late. She also missed her last appointment. She reports some compliance with HEP. She reports overall she is doing well and feels appropriated for Discharge. She has met or nearly met all LTGS. Reviwed HEP and  discharged today.    PT Next Visit Plan  discharge to HEP    PT Home Exercise Plan  HSS, piriformis stretch, LTR, bridge, POE, PPT, supine march, long sit & standing DF stretch, toe yoga, toe raises, yellow tband: inver, ever, DF., supine clam green, side hip abduct       Patient will benefit from skilled therapeutic intervention in order to improve the following deficits and impairments:  Abnormal gait, Decreased activity tolerance, Decreased endurance, Decreased range of motion, Decreased strength, Hypomobility, Difficulty walking, Impaired flexibility, Increased muscle spasms, Pain, Postural dysfunction, Improper body mechanics  Visit Diagnosis: Chronic bilateral low back pain without sciatica  Pain in right foot  Difficulty in walking, not elsewhere classified  Pain in left foot     Problem List Patient Active Problem List   Diagnosis Date Noted  . Plantar fasciitis of left foot 10/24/2018  . Neuropathy due to medical condition (HCC) 01/31/2018  . Difficulty sleeping 01/31/2018  . Unstable angina (HCC)   . Heel pain, bilateral 09/07/2016  . Orthostatic hypotension 09/07/2016  . Right hip pain 08/19/2016  . Lumbar strain 04/27/2016  . Left knee pain 04/27/2016  . Finger pain, right 04/27/2016  . Neck pain 11/23/2015  . Low back pain 07/11/2015  . Right shoulder pain 04/25/2015  . GERD (gastroesophageal reflux disease) 01/28/2015  . Microcytic anemia 01/28/2015  . Near syncope 11/09/2014  . Leg cramps 11/15/2013  . Vitamin D deficiency 11/15/2013  . Prediabetes 03/01/2012  . Chest pain 08/18/2011  . Anxiety and depression 05/31/2009  . Mitral valve disorder 04/15/2009  . CARPAL TUNNEL SYNDROME 01/26/2008  . OBESITY 01/04/2008  . HYPERCHOLESTEROLEMIA 06/24/2006  . Essential hypertension 06/24/2006    Sherrie Mustache, PTA 01/09/2019, 1:58 PM  [facility] [address] [place], [place], [address] Phone:  [phone]   Fax:  [phone]  Name: RYILEE HISEL MRN: [id number] Date of Birth: [birth year]  PHYSICAL THERAPY DISCHARGE SUMMARY  Visits from Start of Care: 10  Current functional level related to goals / functional outcomes:  See above   Remaining deficits: See above   Education / Equipment: Anatomy of condition, POC, HEP, exercise form/rationae  Plan: Patient agrees to discharge.  Patient goals were partially met. Patient is being discharged due to being pleased with the current functional level.  ?????     Sondi Desch C. Hightower PT, DPT 01/10/19 1:02 PM

## 2019-01-11 ENCOUNTER — Ambulatory Visit: Payer: Medicare Other | Admitting: Physical Therapy

## 2019-01-27 ENCOUNTER — Other Ambulatory Visit: Payer: Self-pay

## 2019-01-29 MED ORDER — SERTRALINE HCL 100 MG PO TABS: 100.0000 mg | ORAL_TABLET | Freq: Every day | ORAL | 0 refills | Status: DC

## 2019-02-06 ENCOUNTER — Ambulatory Visit: Payer: Medicare Other | Admitting: Family Medicine

## 2019-02-06 ENCOUNTER — Other Ambulatory Visit: Payer: Self-pay

## 2019-02-06 MED ORDER — FERROUS SULFATE 325 (65 FE) MG PO TABS: ORAL_TABLET | ORAL | 1 refills | Status: DC

## 2019-02-09 ENCOUNTER — Ambulatory Visit (INDEPENDENT_AMBULATORY_CARE_PROVIDER_SITE_OTHER): Payer: Medicare Other | Admitting: Family Medicine

## 2019-02-09 ENCOUNTER — Other Ambulatory Visit: Payer: Self-pay

## 2019-02-09 ENCOUNTER — Encounter: Payer: Self-pay | Admitting: Family Medicine

## 2019-02-09 VITALS — BP 122/74 | HR 60 | Wt 171.4 lb

## 2019-02-09 DIAGNOSIS — I2510 Atherosclerotic heart disease of native coronary artery without angina pectoris: Secondary | ICD-10-CM | POA: Diagnosis not present

## 2019-02-09 DIAGNOSIS — M79644 Pain in right finger(s): Secondary | ICD-10-CM

## 2019-02-09 DIAGNOSIS — R7303 Prediabetes: Secondary | ICD-10-CM

## 2019-02-09 LAB — POCT GLYCOSYLATED HEMOGLOBIN (HGB A1C): Hemoglobin A1C: 5.9 % — AB (ref 4.0–5.6)

## 2019-02-09 MED ORDER — ATORVASTATIN CALCIUM 80 MG PO TABS: 80.0000 mg | ORAL_TABLET | Freq: Every day | ORAL | 3 refills | Status: DC

## 2019-02-09 NOTE — Progress Notes (Signed)
Subjective:    Patient ID: [name] [name], female    DOB: 11/19/1950, 68 y.o.   MRN: [id number]  HPI right hand pain and more.   1. Right hand pain.  Points to middle finger, palmar surface at PIP, MCP and palm.  States finger will sometimes get stuck down.  Has had a previous trigger finger injection by [facility].  Has an appointment with them next week. 2. CAD, SP stent.  Discussed not at goal Cholesterol.  Has tolerated statins well.  Willing to increase atorvastatin.  Knows to not take NSAIDs with her CAD. 3. Prediabetes.  No A1C for>6 months. No polyuria or polydipsea.      Review of Systems     Objective:   Physical Exam  VS noted weight stable. Pain along track of right middle finger flexor tendon.  I cannot feel a lump.  Did not stick during my exam Lungs clear Cardiac RRR without m or g       Assessment & Plan:

## 2019-02-09 NOTE — Assessment & Plan Note (Signed)
A!C nicely stable on diet.  No additional interventions.

## 2019-02-09 NOTE — Assessment & Plan Note (Signed)
Trigger finger by history.  Pain, exam best fits flexor tenosynovitis.  Refer back to [facility] likely for steroid injection.  Also could be MCP inflammation.

## 2019-02-09 NOTE — Assessment & Plan Note (Signed)
Increase statin.  Goal LDL<70

## 2019-02-09 NOTE — Patient Instructions (Signed)
My nurse will try to get you an earlier appointment with sports medicine for a cortisone injection of the trigger finger. I increased your atorvastatin (cholesterol medicine) to 80 mg per day.  I sent in a new prescription.  You can use your current supply by taking two pills per day.

## 2019-02-13 ENCOUNTER — Other Ambulatory Visit: Payer: Self-pay

## 2019-02-13 ENCOUNTER — Ambulatory Visit: Payer: Medicare Other | Admitting: Family Medicine

## 2019-02-13 DIAGNOSIS — M722 Plantar fascial fibromatosis: Secondary | ICD-10-CM

## 2019-02-13 DIAGNOSIS — M79644 Pain in right finger(s): Secondary | ICD-10-CM

## 2019-02-13 MED ORDER — METHYLPREDNISOLONE ACETATE 40 MG/ML IJ SUSP
20.0000 mg | Freq: Once | INTRAMUSCULAR | Status: AC
  Administered 2019-02-13: 12:00:00 20 mg via INTRA_ARTICULAR

## 2019-02-13 NOTE — Assessment & Plan Note (Signed)
Improving.  Recommended continued home exercises and appropriate shoe wear with supports.  Could consider active release massage in the future if this continues to bother her.

## 2019-02-13 NOTE — Progress Notes (Signed)
[name] [name] - 68 y.o. female MRN [id number]  Date of birth: 09-Apr-1950  SUBJECTIVE:   CC: Follow-up plantar fasciitis and right finger pain  HPI: [name] [name] is a 68 year old female presenting to follow-up on right foot plantar fasciitis and right finger pain.  Right plantar fasciitis: Improving.  Only bothers her a few days a week now, mainly notices when she wakes up in the middle the night or in the morning, sometimes at the end of a long day on her feet.  States it is overall tolerable.  She has been wearing her [place] shoes with good support.  Tried night splinting with OTC sock, however after 2 days could not stand any longer, felt that it made it worse.  She continues to do her foot exercises.  Injections in the past made no difference in her pain.  Right finger pain: Getting worse.  Third digit.  Felt that this was secondary to arthropathy versus trigger finger in the past.  Hurts with trying to flex the finger, feels like it does not flex as far as the others.  Will have pain on the palmar aspect at the distal portion of her finger.  Notices it is worse with weather changes.  Tried buddy taping and wearing glove with no improvement.   ROS: No unexpected weight loss, fever, chills, swelling, instability,  numbness/tingling, redness, otherwise see HPI   PMHx - reviewed.   PSHx - reviewed FHx -  reviewed.   Social Hx - reviewed.  Medications - reviewed   PHYSICAL EXAM:  VS: BP:122/72  HR: bpm  TEMP: ( )  RESP:   HT:5\' 2"  (157.5 cm)   WT:170 lb (77.1 kg)  BMI:31.09 PHYSICAL EXAM: Gen: NAD, alert, cooperative with exam, well-appearing Resp: non-labored Skin: no rashes, normal turgor  Neuro: no gross deficits.  Psych:  alert and oriented  MSK:  Right hand: No deformity, swelling, or ecchymoses noted. No nodular area palpated over A1 pulley at third digit. Tenderness to palpation on dorsal and palmar aspects of right third digit MCP joint with palpation of osteophyte  formation on dorsal aspect. Full ROM with the exception of limited flexion of third digit through MCP joint 5/5 strength in PIP, DIP, MCP joints in flexion and extension. Neurovascular intact distally  Right foot/ankle: No gross deformity, swelling, ecchymoses over region. Full range of motion with 5/5 strength through ankle joint. Minimal tenderness to palpation over plantar fascia. Negative anterior drawer and talar tilt tests, neurovascular intact distally.  MCP joint injection: After informed written consent timeout was performed, patient was seated on exam table.  Her right third digit at MCP joint was prepped with alcohol swab and utilizing dorsal approach with ultrasound guided, patient's 3rd MCP joint was injected intraarticularly with 0.5:0.46mL bupivicaine: depomedrol. Patient tolerated the procedure well without immediate complications.   ASSESSMENT & PLAN:   Plantar fasciitis of right foot Improving.  Recommended continued home exercises and appropriate shoe wear with supports.  Could consider active release massage in the future if this continues to bother her.  Finger pain, right Appears more consistent with arthropathy, however may also have some trigger finger involvement.  Injected third digit MCP joint today.  Will have her follow-up in 1 month to assess functionality and pain status.   Follow-up in approximately 1 month for above or sooner if needed.  [name] Stack, DO  Family Medicine PGY-2

## 2019-02-13 NOTE — Assessment & Plan Note (Signed)
Appears more consistent with arthropathy, however may also have some trigger finger involvement.  Injected third digit MCP joint today.  Will have her follow-up in 1 month to assess functionality and pain status.

## 2019-02-13 NOTE — Patient Instructions (Signed)
Continue home exercises for your plantar fasciitis. Wear supportive shoes and ideally the green inserts. Consider massage, active release if your pain does not continue to improve.  You were given an injection for arthritis today. I suspect you also have an element of a trigger finger but the arthritis is the larger contributor to your pain and catching sensation. Follow up with me in 1 month for reevaluation.

## 2019-02-14 ENCOUNTER — Encounter: Payer: Self-pay | Admitting: Family Medicine

## 2019-02-15 ENCOUNTER — Ambulatory Visit: Payer: Medicare Other | Admitting: Family Medicine

## 2019-03-09 ENCOUNTER — Other Ambulatory Visit: Payer: Self-pay | Admitting: Family Medicine

## 2019-03-15 ENCOUNTER — Ambulatory Visit: Payer: Medicare Other | Admitting: Family Medicine

## 2019-03-20 ENCOUNTER — Ambulatory Visit: Payer: Medicare Other | Admitting: Family Medicine

## 2019-04-21 ENCOUNTER — Other Ambulatory Visit: Payer: Self-pay

## 2019-04-21 ENCOUNTER — Ambulatory Visit (INDEPENDENT_AMBULATORY_CARE_PROVIDER_SITE_OTHER): Payer: Medicare Other

## 2019-04-21 VITALS — BP 123/61 | HR 66 | Ht 62.0 in | Wt 167.0 lb

## 2019-04-21 DIAGNOSIS — Z0000 Encounter for general adult medical examination without abnormal findings: Secondary | ICD-10-CM | POA: Diagnosis not present

## 2019-04-21 NOTE — Progress Notes (Signed)
Subjective:   [name] [name] is a 69 y.o. female who presents for Medicare Annual (Subsequent) preventive examination.  The patient consented to a virtual visit.  Review of Systems: Defer to PCP.  Cardiac Risk Factors include: advanced age (>83men, >13 women);obesity (BMI >30kg/m2);hypertension  Objective:   Vitals: BP 123/61   Pulse 66   Ht 5\' 2"  (1.575 m)   Wt 167 lb (75.8 kg)   BMI 30.54 kg/m   Body mass index is 30.54 kg/m.  Advanced Directives 04/21/2019 11/14/2018 08/09/2018 09/15/2017 07/22/2017 04/13/2017 03/01/2017  Does Patient Have a Medical Advance Directive? No No No No No No No  Would patient like information on creating a medical advance directive? Yes (MAU/Ambulatory/Procedural Areas - Information given) No - Patient declined No - Patient declined No - Patient declined No - Patient declined Yes (MAU/Ambulatory/Procedural Areas - Information given) No - Patient declined   Tobacco Social History   Tobacco Use  Smoking Status Former Smoker  . Packs/day: 0.50  . Years: 20.00  . Pack years: 10.00  . Quit date: 07/30/2004  . Years since quitting: 14.7  Smokeless Tobacco Never Used     Still non smoker, has not smoked since quit date.  Clinical Intake:  Pre-visit preparation completed: Yes  How often do you need to have someone help you when you read instructions, pamphlets, or other written materials from your doctor or pharmacy?: 2 - Rarely What is the last grade level you completed in school?: High School  Interpreter Needed?: No  Past Medical History:  Diagnosis Date  . Coronary artery disease   . Depression   . Hyperlipidemia   . Hypertension   . Microcytic anemia   . Mitral valve prolapse    Past Surgical History:  Procedure Laterality Date  . ABDOMINAL HYSTERECTOMY    . CARDIAC CATHETERIZATION    . CORONARY STENT INTERVENTION N/A 03/01/2017   Procedure: CORONARY STENT INTERVENTION;  Surgeon: Burnell Blanks, MD;  Location: [facility];  Service: Cardiovascular;  Laterality: N/A;  . INTRAVASCULAR PRESSURE WIRE/FFR STUDY N/A 03/01/2017   Procedure: INTRAVASCULAR PRESSURE WIRE/FFR STUDY;  Surgeon: Burnell Blanks, MD;  Location: [facility];  Service: Cardiovascular;  Laterality: N/A;  . LEFT HEART CATH AND CORONARY ANGIOGRAPHY N/A 03/01/2017   Procedure: LEFT HEART CATH AND CORONARY ANGIOGRAPHY;  Surgeon: Burnell Blanks, MD;  Location: [facility];  Service: Cardiovascular;  Laterality: N/A;  . PARTIAL HYSTERECTOMY     Family History  Problem Relation Age of Onset  . Heart disease Mother        CHF  . Diabetes Mother   . Hypertension Mother   . Cancer Father   . Hypertension Sister   . Pancreatitis Sister   . Dementia Sister    Social History   Socioeconomic History  . Marital status: Married    Spouse name: Quita Skye  . Number of children: 3  . Years of education: 42  . Highest education level: High school graduate  Occupational History    Comment: Retired  Tobacco Use  . Smoking status: Former Smoker    Packs/day: 0.50    Years: 20.00    Pack years: 10.00    Quit date: 07/30/2004    Years since quitting: 14.7  . Smokeless tobacco: Never Used  Substance and Sexual Activity  . Alcohol use: Yes    Alcohol/week: 0.0 standard drinks    Comment: Occasional  . Drug use: No  . Sexual  activity: Yes    Partners: Male    Birth control/protection: Post-menopausal  Other Topics Concern  . Not on file  Social History Narrative   Patient lives in [place] with her husband and niece.   Patient has 3 daughters she is close with. One lives right next door.    Patient enjoys sewing, cooking, reading, and outdoor activities.    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    Social Determinants of Health   Financial Resource Strain: Low Risk   . Difficulty of Paying Living Expenses: Not hard at all  Food Insecurity: No Food Insecurity  . Worried About  Charity fundraiser in the Last Year: Never true  . Ran Out of Food in the Last Year: Never true  Transportation Needs: No Transportation Needs  . Lack of Transportation (Medical): No  . Lack of Transportation (Non-Medical): No  Physical Activity: Sufficiently Active  . Days of Exercise per Week: 3 days  . Minutes of Exercise per Session: 60 min  Stress: No Stress Concern Present  . Feeling of Stress : Only a little  Social Connections: Not Isolated  . Frequency of Communication with Friends and Family: More than three times a week  . Frequency of Social Gatherings with Friends and Family: More than three times a week  . Attends Religious Services: More than 4 times per year  . Active Member of Clubs or Organizations: Yes  . Attends Archivist Meetings: More than 4 times per year  . Marital Status: Married   Outpatient Encounter Medications as of 04/21/2019  Medication Sig  . amLODipine (NORVASC) 5 MG tablet TAKE 1 TABLET BY MOUTH EVERY DAY  . aspirin 81 MG tablet Take 81 mg by mouth daily.  [name] atorvastatin (LIPITOR) 80 MG tablet Take 1 tablet (80 mg total) by mouth daily.  . cyclobenzaprine (FLEXERIL) 10 MG tablet TAKE 1 TABLET (10 MG TOTAL) BY MOUTH DAILY AS NEEDED FOR MUSCLE SPASMS.  . ferrous sulfate 325 (65 FE) MG tablet TAKE 1 TABLET BY MOUTH EVERY DAY WITH BREAKFAST  . fluticasone (FLONASE) 50 MCG/ACT nasal spray Place 1 spray daily into both nostrils.  [name] gabapentin (NEURONTIN) 100 MG capsule TAKE 2 CAPSULES (200 MG TOTAL) BY MOUTH AT BEDTIME.  . metoprolol tartrate (LOPRESSOR) 25 MG tablet Take 0.5 tablets (12.5 mg total) by mouth 2 (two) times daily.  . pantoprazole (PROTONIX) 40 MG tablet TAKE 1 TABLET BY MOUTH TWICE A DAY  . sertraline (ZOLOFT) 100 MG tablet TAKE 1 TABLET BY MOUTH EVERY DAY  . nitroGLYCERIN (NITROSTAT) 0.4 MG SL tablet PLACE 1 TABLET (0.4 MG TOTAL) UNDER THE TONGUE EVERY 5 (FIVE) MINUTES AS NEEDED FOR CHEST PAIN.   No facility-administered encounter  medications on file as of 04/21/2019.   Activities of Daily Living In your present state of health, do you have any difficulty performing the following activities: 04/21/2019  Hearing? N  Vision? N  Difficulty concentrating or making decisions? N  Walking or climbing stairs? N  Dressing or bathing? N  Doing errands, shopping? N  Preparing Food and eating ? N  Using the Toilet? N  In the past six months, have you accidently leaked urine? Y  Do you have problems with loss of bowel control? N  Managing your Medications? N  Managing your Finances? N  Housekeeping or managing your Housekeeping? N  Some recent data might be hidden   Patient Care Team: Kathrene Alu, MD  as PCP - General (Family Medicine) Galvin Proffer, OD (Optometry)    Assessment:   This is a routine wellness examination for Terrilyn.  Exercise Activities and Dietary recommendations Current Exercise Habits: The patient has a physically strenuous job, but has no regular exercise apart from work.  Goals    . Exercise 150 minutes per week (moderate activity)    . Weight (lb) < 165 lb (74.8 kg) (pt-stated)     Continue to work on this. Great job staying active and taking the stairs at your part time job!      Fall Risk Fall Risk  04/21/2019 02/09/2019 08/09/2018 09/15/2017 07/22/2017  Falls in the past year? 0 0 0 No No  Number falls in past yr: - - - - -  Comment - - - - -  Injury with Fall? - - - - -   Is the patient's home free of loose throw rugs in walkways, pet beds, electrical cords, etc?   yes      Grab bars in the bathroom? no      Handrails on the stairs?   yes      Adequate lighting?   yes  Patient rating of health (0-10) scale: 10   Depression Screen PHQ 2/9 Scores 04/21/2019 04/21/2019 02/09/2019 01/31/2018  PHQ - 2 Score 0 0 0 0  PHQ- 9 Score - - - -    Cognitive Function 6CIT Screen 04/21/2019  What Year? 0 points  What month? 0 points  What time? 0 points  Count back from 20 0  points  Months in reverse 0 points  Repeat phrase 0 points  Total Score 0   Immunization History  Administered Date(s) Administered  . Influenza Split 02/04/2011, 12/15/2011  . Influenza Whole 01/04/2008, 01/26/2014  . Influenza,inj,Quad PF,6+ Mos 01/28/2015, 01/02/2016, 12/09/2016  . Influenza-Unspecified 12/12/2017, 11/24/2018  . Pneumococcal Conjugate-13 01/28/2015  . Pneumococcal Polysaccharide-23 12/09/2016  . Td 06/28/2004  . Tdap 01/28/2015   Screening Tests Health Maintenance  Topic Date Due  . FOOT EXAM  04/24/1959  . URINE MICROALBUMIN  04/24/1959  . COLONOSCOPY  04/24/1999  . OPHTHALMOLOGY EXAM  11/29/2018  . HEMOGLOBIN A1C  08/09/2019  . MAMMOGRAM  11/03/2020  . TETANUS/TDAP  01/27/2025  . INFLUENZA VACCINE  Completed  . DEXA SCAN  Completed  . Hepatitis C Screening  Completed  . PNA vac Low Risk Adult  Completed   Cancer Screenings: Lung: Low Dose CT Chest recommended if Age 2-80 years, 30 pack-year currently smoking OR have quit w/in 15years. Patient does not qualify. Breast:  Up to date on Mammogram? Yes   Up to date of Bone Density/Dexa? Yes Colorectal: Patient is due- counseling given and other options such as Cologuard  Additional Screenings: Hepatitis C Screening: Completed   Plan:  Fill out an advance directive.  Continue keeping up the good work walking stairs at your part time job and staying active! Your blood pressure looks good today. Get your eye exam. Schedule with PCP before she graduates in June!  I have personally reviewed and noted the following in the patient's chart:   . Medical and social history . Use of alcohol, tobacco or illicit drugs  . Current medications and supplements . Functional ability and status . Nutritional status . Physical activity . Advanced directives . List of other physicians . Hospitalizations, surgeries, and ER visits in previous 12 months . Vitals . Screenings to include cognitive, depression, and  falls . Referrals and appointments  In  addition, I have reviewed and discussed with patient certain preventive protocols, quality metrics, and best practice recommendations. A written personalized care plan for preventive services as well as general preventive health recommendations were provided to patient.  This visit was conducted virtually in the setting of the [place] pandemic.    Dorna Bloom, [place]  04/21/2019

## 2019-04-21 NOTE — Patient Instructions (Addendum)
You spoke to [name] [name], [place] over the phone for your annual wellness visit.  We discussed goals:  Goals    . Exercise 150 minutes per week (moderate activity)    . Weight (lb) < 165 lb (74.8 kg) (pt-stated)     Continue to work on this. Great job staying active and taking the stairs at your part time job!      We also discussed recommended health maintenance. As discussed, you are due for your eye exam with Dr. Clent Demark. At next PCP visit, discuss options other than a colonoscopy to screen for colon cancer.   Health Maintenance  Topic Date Due  . FOOT EXAM  04/24/1959  . URINE MICROALBUMIN  04/24/1959  . COLONOSCOPY  04/24/1999  . OPHTHALMOLOGY EXAM  11/29/2018  . HEMOGLOBIN A1C  08/09/2019  . MAMMOGRAM  11/03/2020  . TETANUS/TDAP  01/27/2025  . INFLUENZA VACCINE  Completed  . DEXA SCAN  Completed  . Hepatitis C Screening  Completed  . PNA vac Low Risk Adult  Completed   Fill out an advance directive.  Continue keeping up the good work walking stairs at your part time job and staying active! Your blood pressure looks good today. Get your eye exam. Schedule with PCP before she graduates in June!  Health Maintenance, Female Adopting a healthy lifestyle and getting preventive care are important in promoting health and wellness. Ask your health care provider about:  The right schedule for you to have regular tests and exams.  Things you can do on your own to prevent diseases and keep yourself healthy. What should I know about diet, weight, and exercise? Eat a healthy diet   Eat a diet that includes plenty of vegetables, fruits, low-fat dairy products, and lean protein.  Do not eat a lot of foods that are high in solid fats, added sugars, or sodium. Maintain a healthy weight Body mass index (BMI) is used to identify weight problems. It estimates body fat based on height and weight. Your health care provider can help determine your BMI and help you achieve or maintain a  healthy weight. Get regular exercise Get regular exercise. This is one of the most important things you can do for your health. Most adults should:  Exercise for at least 150 minutes each week. The exercise should increase your heart rate and make you sweat (moderate-intensity exercise).  Do strengthening exercises at least twice a week. This is in addition to the moderate-intensity exercise.  Spend less time sitting. Even light physical activity can be beneficial. Watch cholesterol and blood lipids Have your blood tested for lipids and cholesterol at 70 years of age, then have this test every 5 years. Have your cholesterol levels checked more often if:  Your lipid or cholesterol levels are high.  You are older than 70 years of age.  You are at high risk for heart disease. What should I know about cancer screening? Depending on your health history and family history, you may need to have cancer screening at various ages. This may include screening for:  Breast cancer.  Cervical cancer.  Colorectal cancer.  Skin cancer.  Lung cancer. What should I know about heart disease, diabetes, and high blood pressure? Blood pressure and heart disease  High blood pressure causes heart disease and increases the risk of stroke. This is more likely to develop in people who have high blood pressure readings, are of African descent, or are overweight.  Have your blood pressure checked: ? Every  3-5 years if you are 79-51 years of age. ? Every year if you are 61 years old or older. Diabetes Have regular diabetes screenings. This checks your fasting blood sugar level. Have the screening done:  Once every three years after age 28 if you are at a normal weight and have a low risk for diabetes.  More often and at a younger age if you are overweight or have a high risk for diabetes. What should I know about preventing infection? Hepatitis B If you have a higher risk for hepatitis B, you should  be screened for this virus. Talk with your health care provider to find out if you are at risk for hepatitis B infection. Hepatitis C Testing is recommended for:  Everyone born from [birth year] through 1966.  Anyone with known risk factors for hepatitis C. Sexually transmitted infections (STIs)  Get screened for STIs, including gonorrhea and chlamydia, if: ? You are sexually active and are younger than 70 years of age. ? You are older than 70 years of age and your health care provider tells you that you are at risk for this type of infection. ? Your sexual activity has changed since you were last screened, and you are at increased risk for chlamydia or gonorrhea. Ask your health care provider if you are at risk.  Ask your health care provider about whether you are at high risk for HIV. Your health care provider may recommend a prescription medicine to help prevent HIV infection. If you choose to take medicine to prevent HIV, you should first get tested for HIV. You should then be tested every 3 months for as long as you are taking the medicine. Pregnancy  If you are about to stop having your period (premenopausal) and you may become pregnant, seek counseling before you get pregnant.  Take 400 to 800 micrograms (mcg) of folic acid every day if you become pregnant.  Ask for birth control (contraception) if you want to prevent pregnancy. Osteoporosis and menopause Osteoporosis is a disease in which the bones lose minerals and strength with aging. This can result in bone fractures. If you are 53 years old or older, or if you are at risk for osteoporosis and fractures, ask your health care provider if you should:  Be screened for bone loss.  Take a calcium or vitamin D supplement to lower your risk of fractures.  Be given hormone replacement therapy (HRT) to treat symptoms of menopause. Follow these instructions at home: Lifestyle  Do not use any products that contain nicotine or tobacco, such  as cigarettes, e-cigarettes, and chewing tobacco. If you need help quitting, ask your health care provider.  Do not use street drugs.  Do not share needles.  Ask your health care provider for help if you need support or information about quitting drugs. Alcohol use  Do not drink alcohol if: ? Your health care provider tells you not to drink. ? You are pregnant, may be pregnant, or are planning to become pregnant.  If you drink alcohol: ? Limit how much you use to 0-1 drink a day. ? Limit intake if you are breastfeeding.  Be aware of how much alcohol is in your drink. In the [place] one drink equals one 12 oz bottle of beer (355 mL), one 5 oz glass of wine (148 mL), or one 1 oz glass of hard liquor (44 mL). General instructions  Schedule regular health, dental, and eye exams.  Stay current with your vaccines.  Tell your  health care provider if: ? You often feel depressed. ? You have ever been abused or do not feel safe at home. Summary  Adopting a healthy lifestyle and getting preventive care are important in promoting health and wellness.  Follow your health care provider's instructions about healthy diet, exercising, and getting tested or screened for diseases.  Follow your health care provider's instructions on monitoring your cholesterol and blood pressure. This information is not intended to replace advice given to you by your health care provider. Make sure you discuss any questions you have with your health care provider. Document Revised: 03/09/2018 Document Reviewed: 03/09/2018 Elsevier Patient Education  2020 [facility]'s number is [phone]. Please call with questions or concerns about what we discussed today.

## 2019-05-10 NOTE — Progress Notes (Signed)
HPI: [name] MVPand orthostasis. Carotid Dopplers 2010showed no significant stenosis.Echocardiogram repeated9/16and showed normal LV function.Patient had chest pain followed by abnormal nuclear study.Patient then had cardiac catheterization December 2018 that showed an 80% ostial ramus and ejection fraction 50-55%. Patient had PCI with drug-eluting stent.Since last seen,the patient denies any dyspnea on exertion, orthopnea, PND, pedal edema, palpitations, syncope or chest pain.   Current Outpatient Medications  Medication Sig Dispense Refill  . amLODipine (NORVASC) 5 MG tablet TAKE 1 TABLET BY MOUTH EVERY DAY 90 tablet 3  . aspirin 81 MG tablet Take 81 mg by mouth daily.    [name] atorvastatin (LIPITOR) 80 MG tablet Take 1 tablet (80 mg total) by mouth daily. 90 tablet 3  . cyclobenzaprine (FLEXERIL) 10 MG tablet TAKE 1 TABLET (10 MG TOTAL) BY MOUTH DAILY AS NEEDED FOR MUSCLE SPASMS. 30 tablet 1  . ferrous sulfate 325 (65 FE) MG tablet TAKE 1 TABLET BY MOUTH EVERY DAY WITH BREAKFAST 90 tablet 1  . fluticasone (FLONASE) 50 MCG/ACT nasal spray Place 1 spray daily into both nostrils.    [name] gabapentin (NEURONTIN) 100 MG capsule TAKE 2 CAPSULES (200 MG TOTAL) BY MOUTH AT BEDTIME. 180 capsule 1  . metoprolol tartrate (LOPRESSOR) 25 MG tablet Take 0.5 tablets (12.5 mg total) by mouth 2 (two) times daily. 60 tablet 6  . pantoprazole (PROTONIX) 40 MG tablet TAKE 1 TABLET BY MOUTH TWICE A DAY 180 tablet 3  . sertraline (ZOLOFT) 100 MG tablet TAKE 1 TABLET BY MOUTH EVERY DAY 90 tablet 0  . nitroGLYCERIN (NITROSTAT) 0.4 MG SL tablet PLACE 1 TABLET (0.4 MG TOTAL) UNDER THE TONGUE EVERY 5 (FIVE) MINUTES AS NEEDED FOR CHEST PAIN. 25 tablet 7   No current facility-administered medications for this visit.     Past Medical History:  Diagnosis Date  . Coronary artery disease   . Depression   . Hyperlipidemia   . Hypertension   . Microcytic anemia   . Mitral valve prolapse     Past  Surgical History:  Procedure Laterality Date  . ABDOMINAL HYSTERECTOMY    . CARDIAC CATHETERIZATION    . CORONARY STENT INTERVENTION N/A 03/01/2017   Procedure: CORONARY STENT INTERVENTION;  Surgeon: Burnell Blanks, MD;  Location: [facility];  Service: Cardiovascular;  Laterality: N/A;  . INTRAVASCULAR PRESSURE WIRE/FFR STUDY N/A 03/01/2017   Procedure: INTRAVASCULAR PRESSURE WIRE/FFR STUDY;  Surgeon: Burnell Blanks, MD;  Location: [facility];  Service: Cardiovascular;  Laterality: N/A;  . LEFT HEART CATH AND CORONARY ANGIOGRAPHY N/A 03/01/2017   Procedure: LEFT HEART CATH AND CORONARY ANGIOGRAPHY;  Surgeon: Burnell Blanks, MD;  Location: [facility];  Service: Cardiovascular;  Laterality: N/A;  . PARTIAL HYSTERECTOMY      Social History   Socioeconomic History  . Marital status: Married    Spouse name: Quita Skye  . Number of children: 3  . Years of education: 92  . Highest education level: High school graduate  Occupational History    Comment: Retired  Tobacco Use  . Smoking status: Former Smoker    Packs/day: 0.50    Years: 20.00    Pack years: 10.00    Quit date: 07/30/2004    Years since quitting: 14.8  . Smokeless tobacco: Never Used  Substance and Sexual Activity  . Alcohol use: Yes    Alcohol/week: 0.0 standard drinks    Comment: Occasional  . Drug use: No  . Sexual activity: Yes  Partners: Male    Birth control/protection: Post-menopausal  Other Topics Concern  . Not on file  Social History Narrative   Patient lives in [place] with her husband and niece.   Patient has 3 daughters she is close with. One lives right next door.    Patient enjoys sewing, cooking, reading, and outdoor activities.    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    Social Determinants of Health   Financial Resource Strain: Low Risk   . Difficulty of Paying Living Expenses: Not hard at all  Food Insecurity: No  Food Insecurity  . Worried About Charity fundraiser in the Last Year: Never true  . Ran Out of Food in the Last Year: Never true  Transportation Needs: No Transportation Needs  . Lack of Transportation (Medical): No  . Lack of Transportation (Non-Medical): No  Physical Activity: Sufficiently Active  . Days of Exercise per Week: 3 days  . Minutes of Exercise per Session: 60 min  Stress: No Stress Concern Present  . Feeling of Stress : Only a little  Social Connections: Not Isolated  . Frequency of Communication with Friends and Family: More than three times a week  . Frequency of Social Gatherings with Friends and Family: More than three times a week  . Attends Religious Services: More than 4 times per year  . Active Member of Clubs or Organizations: Yes  . Attends Archivist Meetings: More than 4 times per year  . Marital Status: Married  Human resources officer Violence: Not At Risk  . Fear of Current or Ex-Partner: No  . Emotionally Abused: No  . Physically Abused: No  . Sexually Abused: No    Family History  Problem Relation Age of Onset  . Heart disease Mother        CHF  . Diabetes Mother   . Hypertension Mother   . Cancer Father   . Hypertension Sister   . Pancreatitis Sister   . Dementia Sister     ROS: no fevers or chills, productive cough, hemoptysis, dysphasia, odynophagia, melena, hematochezia, dysuria, hematuria, rash, seizure activity, orthopnea, PND, pedal edema, claudication. Remaining systems are negative.  Physical Exam: Well-developed well-nourished in no acute distress.  Skin is warm and dry.  HEENT is normal.  Neck is supple.  Chest is clear to auscultation with normal expansion.  Cardiovascular exam is regular rate and rhythm.  Abdominal exam nontender or distended. No masses palpated. Extremities show no edema. neuro grossly intact  ECG-Sinus with no ST changes; personally reviewed  A/P  1 coronary artery disease-patient denies  exertional chest pain.  Continue aspirin and statin.  2 hypertension-blood pressure controlled.  Continue present medical regimen.  3 hyperlipidemia-continue statin. Check lipids and liver.  4 question history of mitral valve prolapse-this was not shown on most recent echocardiogram.  5 history of orthostatic hypotension-I again encouraged fluid intake and sodium intake.  Symptoms are reasonably well controlled at present.  [name], MD

## 2019-05-19 ENCOUNTER — Encounter: Payer: Self-pay | Admitting: Cardiology

## 2019-05-19 ENCOUNTER — Ambulatory Visit: Payer: Medicare Other | Admitting: Cardiology

## 2019-05-19 ENCOUNTER — Other Ambulatory Visit: Payer: Self-pay

## 2019-05-19 VITALS — BP 135/60 | HR 56 | Temp 95.1°F | Ht 62.0 in | Wt 171.4 lb

## 2019-05-19 DIAGNOSIS — I2510 Atherosclerotic heart disease of native coronary artery without angina pectoris: Secondary | ICD-10-CM | POA: Diagnosis not present

## 2019-05-19 DIAGNOSIS — E7800 Pure hypercholesterolemia, unspecified: Secondary | ICD-10-CM

## 2019-05-19 DIAGNOSIS — I10 Essential (primary) hypertension: Secondary | ICD-10-CM

## 2019-05-19 DIAGNOSIS — I059 Rheumatic mitral valve disease, unspecified: Secondary | ICD-10-CM | POA: Diagnosis not present

## 2019-05-19 NOTE — Patient Instructions (Signed)
Medication Instructions:  NO CHANGE *If you need a refill on your cardiac medications before your next appointment, please call your pharmacy*  Lab Work: Your physician recommends that you return for lab work PRIOR TO EATING  If you have labs (blood work) drawn today and your tests are completely normal, you will receive your results only by: [name] MyChart Message (if you have MyChart) OR . A paper copy in the mail If you have any lab test that is abnormal or we need to change your treatment, we will call you to review the results.  Follow-Up: At [facility], you and your health needs are our priority.  As part of our continuing mission to provide you with exceptional heart care, we have created designated Provider Care Teams.  These Care Teams include your primary Cardiologist (physician) and Advanced Practice Providers (APPs -  Physician Assistants and Nurse Practitioners) who all work together to provide you with the care you need, when you need it.  Your next appointment:   12 month(s)  The format for your next appointment:   Either In Person or Virtual  Provider:   You may see [name] MD or one of the following Advanced Practice Providers on your designated Care Team:    Kerin Ransom, PA-C  [place], [place]  Coletta Memos, [place]

## 2019-05-29 ENCOUNTER — Ambulatory Visit: Payer: Medicare Other | Attending: Internal Medicine

## 2019-05-29 DIAGNOSIS — Z23 Encounter for immunization: Secondary | ICD-10-CM

## 2019-05-29 NOTE — Progress Notes (Signed)
Covid-19 Vaccination Clinic  Name:  AMADITA STONEBREAKER    MRN: [id number] DOB: September 06, 1950  05/29/2019  Ms. Yance was observed post Covid-19 immunization for 15 minutes without incidence. She was provided with Vaccine Information Sheet and instruction to access the V-Safe system.   Ms. Gelb was instructed to call 911 with any severe reactions post vaccine: [name] Difficulty breathing  . Swelling of your face and throat  . A fast heartbeat  . A bad rash all over your body  . Dizziness and weakness    Immunizations Administered    Name Date Dose VIS Date Route   Pfizer COVID-19 Vaccine 05/29/2019 11:58 AM 0.3 mL 03/10/2019 Intramuscular   Manufacturer: [employer], [employer]   Lot: [id number]   NDC: [address]-2

## 2019-06-27 ENCOUNTER — Ambulatory Visit: Payer: Medicare Other | Attending: Internal Medicine

## 2019-06-27 DIAGNOSIS — Z23 Encounter for immunization: Secondary | ICD-10-CM

## 2019-06-27 NOTE — Progress Notes (Signed)
Covid-19 Vaccination Clinic  Name:  [name] [name]    MRN: [id number] DOB: 1950-06-08  06/27/2019  [name] was observed post Covid-19 immunization for 15 minutes without incident. She was provided with Vaccine Information Sheet and instruction to access the V-Safe system.   [name] was instructed to call 911 with any severe reactions post vaccine: [name] Difficulty breathing  . Swelling of face and throat  . A fast heartbeat  . A bad rash all over body  . Dizziness and weakness   Immunizations Administered    Name Date Dose VIS Date Route   Pfizer COVID-19 Vaccine 06/27/2019 12:01 PM 0.3 mL 03/10/2019 Intramuscular   Manufacturer: [employer], [employer]   Lot: [id number]   NDC: [address]-2

## 2019-07-20 ENCOUNTER — Ambulatory Visit: Payer: Medicare Other

## 2019-07-20 DIAGNOSIS — M25552 Pain in left hip: Secondary | ICD-10-CM | POA: Insufficient documentation

## 2019-07-20 NOTE — Progress Notes (Deleted)
    SUBJECTIVE:   CHIEF COMPLAINT / HPI:   Left hip pain: Patient presenting today with history of left hip pain.***   PERTINENT  PMH / PSH:  Patient Active Problem List   Diagnosis Date Noted  . Plantar fasciitis of right foot 02/13/2019  . CAD (coronary artery disease) 02/09/2019  . Plantar fasciitis of left foot 10/24/2018  . Neuropathy due to medical condition ([place]) 01/31/2018  . Difficulty sleeping 01/31/2018  . Unstable angina ([place])   . Heel pain, bilateral 09/07/2016  . Orthostatic hypotension 09/07/2016  . Right hip pain 08/19/2016  . Lumbar strain 04/27/2016  . Left knee pain 04/27/2016  . Finger pain, right 04/27/2016  . Neck pain 11/23/2015  . Low back pain 07/11/2015  . Right shoulder pain 04/25/2015  . GERD (gastroesophageal reflux disease) 01/28/2015  . Microcytic anemia 01/28/2015  . Near syncope 11/09/2014  . Leg cramps 11/15/2013  . Vitamin D deficiency 11/15/2013  . Prediabetes 03/01/2012  . Chest pain 08/18/2011  . Anxiety and depression 05/31/2009  . Mitral valve disorder 04/15/2009  . CARPAL TUNNEL SYNDROME 01/26/2008  . OBESITY 01/04/2008  . HYPERCHOLESTEROLEMIA 06/24/2006  . Essential hypertension 06/24/2006    OBJECTIVE:   There were no vitals taken for this visit.   Physical exam: General:*** Cardiac:*** Respiratory:*** Hip: *** - Inspection: No gross deformity, no swelling, erythema, or ecchymosis - Palpation: No TTP, specifically none over greater trochanter - ROM: Normal range of motion on Flexion, extension, abduction, internal and external rotation - Strength: Normal strength. - Neuro/vasc: NV intact distally - Special Tests: Negative FABER and FADIR.  Negative Scour.  Negative Trendelenberg.  Negative Ober's.    ASSESSMENT/PLAN:   No problem-specific Assessment & Plan notes found for this encounter.     [place]

## 2019-07-21 ENCOUNTER — Ambulatory Visit: Payer: Medicare Other

## 2019-07-21 DIAGNOSIS — M25552 Pain in left hip: Secondary | ICD-10-CM

## 2019-07-27 ENCOUNTER — Ambulatory Visit: Payer: Medicare Other

## 2019-07-31 ENCOUNTER — Encounter: Payer: Self-pay | Admitting: *Deleted

## 2019-08-11 ENCOUNTER — Other Ambulatory Visit: Payer: Self-pay | Admitting: Cardiology

## 2019-08-28 ENCOUNTER — Other Ambulatory Visit: Payer: Self-pay | Admitting: Cardiology

## 2019-08-29 ENCOUNTER — Other Ambulatory Visit: Payer: Self-pay | Admitting: Cardiology

## 2019-08-31 ENCOUNTER — Other Ambulatory Visit: Payer: Self-pay | Admitting: Family Medicine

## 2019-09-10 ENCOUNTER — Other Ambulatory Visit: Payer: Self-pay | Admitting: Cardiology

## 2019-09-13 ENCOUNTER — Other Ambulatory Visit: Payer: Self-pay

## 2019-09-13 MED ORDER — FERROUS SULFATE 325 (65 FE) MG PO TABS: ORAL_TABLET | ORAL | 1 refills | Status: DC

## 2019-09-14 ENCOUNTER — Other Ambulatory Visit: Payer: Self-pay

## 2019-09-14 MED ORDER — SERTRALINE HCL 100 MG PO TABS: 100.0000 mg | ORAL_TABLET | Freq: Every day | ORAL | 3 refills | Status: DC

## 2019-10-30 ENCOUNTER — Other Ambulatory Visit: Payer: Self-pay | Admitting: Cardiology

## 2019-11-15 ENCOUNTER — Telehealth: Payer: Self-pay | Admitting: *Deleted

## 2019-11-15 DIAGNOSIS — E7800 Pure hypercholesterolemia, unspecified: Secondary | ICD-10-CM

## 2019-11-15 LAB — HEPATIC FUNCTION PANEL
ALT: 15 IU/L (ref 0–32)
AST: 13 IU/L (ref 0–40)
Albumin: 4.5 g/dL (ref 3.8–4.8)
Alkaline Phosphatase: 75 IU/L (ref 48–121)
Bilirubin Total: 0.8 mg/dL (ref 0.0–1.2)
Bilirubin, Direct: 0.19 mg/dL (ref 0.00–0.40)
Total Protein: 7.3 g/dL (ref 6.0–8.5)

## 2019-11-15 LAB — LIPID PANEL
Chol/HDL Ratio: 2.8 ratio (ref 0.0–4.4)
Cholesterol, Total: 188 mg/dL (ref 100–199)
HDL: 67 mg/dL (ref >39)
LDL Chol Calc (NIH): 102 mg/dL — ABNORMAL HIGH (ref 0–99)
Triglycerides: 107 mg/dL (ref 0–149)
VLDL Cholesterol Cal: 19 mg/dL (ref 5–40)

## 2019-11-15 NOTE — Telephone Encounter (Addendum)
Left message for pt to call    ----- Message from Lelon Perla, MD sent at 11/15/2019  7:38 AM EDT ----- Add zetia 10 mg daily; lipids and liver 12 weeks [name]

## 2019-11-27 ENCOUNTER — Other Ambulatory Visit: Payer: Self-pay | Admitting: *Deleted

## 2019-11-27 DIAGNOSIS — E7800 Pure hypercholesterolemia, unspecified: Secondary | ICD-10-CM

## 2019-11-27 MED ORDER — EZETIMIBE 10 MG PO TABS: 10.0000 mg | ORAL_TABLET | Freq: Every day | ORAL | 3 refills | Status: DC

## 2019-11-27 NOTE — Telephone Encounter (Signed)
Spoke with pt, Aware of dr crenshaw's recommendations.  ?New script sent to the pharmacy  ?Lab orders mailed to the pt  ?

## 2019-11-27 NOTE — Progress Notes (Signed)
lip

## 2020-01-05 ENCOUNTER — Other Ambulatory Visit: Payer: Self-pay | Admitting: *Deleted

## 2020-01-05 MED ORDER — SERTRALINE HCL 100 MG PO TABS: 100.0000 mg | ORAL_TABLET | Freq: Every day | ORAL | 0 refills | Status: DC

## 2020-01-23 ENCOUNTER — Ambulatory Visit: Payer: Medicare Other | Attending: Internal Medicine

## 2020-01-23 DIAGNOSIS — Z23 Encounter for immunization: Secondary | ICD-10-CM

## 2020-01-23 NOTE — Progress Notes (Signed)
Covid-19 Vaccination Clinic  Name:  [name] [name]    MRN: [id number] DOB: 1950/12/12  01/23/2020  [name] [name] was observed post Covid-19 immunization for 15 minutes without incident. She was provided with Vaccine Information Sheet and instruction to access the V-Safe system.   [name] [name] was instructed to call 911 with any severe reactions post vaccine: [name] Difficulty breathing  . Swelling of face and throat  . A fast heartbeat  . A bad rash all over body  . Dizziness and weakness

## 2020-02-12 ENCOUNTER — Ambulatory Visit (INDEPENDENT_AMBULATORY_CARE_PROVIDER_SITE_OTHER): Payer: Medicare Other | Admitting: Family Medicine

## 2020-02-12 ENCOUNTER — Other Ambulatory Visit: Payer: Self-pay

## 2020-02-12 VITALS — BP 138/65 | HR 68 | Wt 180.6 lb

## 2020-02-12 DIAGNOSIS — N393 Stress incontinence (female) (male): Secondary | ICD-10-CM | POA: Insufficient documentation

## 2020-02-12 MED ORDER — ESTROGENS, CONJUGATED 0.625 MG/GM VA CREA: TOPICAL_CREAM | VAGINAL | 12 refills | Status: DC

## 2020-02-12 NOTE — Progress Notes (Signed)
Subjective:   Patient ID: [name] [name]    DOB: [birth year], 69 y.o. female   MRN: [id number]  [name] [name] is a 69 y.o. female with a history of CAD, hypertension, atrial valve disorder, orthostatic hypotension, unstable angina, GERD, carpal tunnel syndrome, neuropathy, lumbar strain, plantar fasciitis, anxiety, depression, microcytic anemia, obesity, prediabetes, vitamin D deficiency here for bladder leakage.  Bladder Leakage:  Patient notes that she has bladder leakage every time she sneezes, coughs, laughs.  She quit smoking but occasionally has a puffer to have a cigarette and if she coughs she will urinate.  She denies pelvic pain, dysuria, frequency, urgency.  She notes that she wakes up about 2 times per night.  Denies excessive caffeine intake.  Occasional alcohol.  She notes daily to every other day bowel movements.  She has had 3 children vaginally.  Review of Systems:  Per HPI.   Objective:   BP 138/65   Pulse 68   Wt 180 lb 9.6 oz (81.9 kg)   SpO2 97%   BMI 33.03 kg/m  Vitals and nursing note reviewed.  General: pleasant older woman, sitting comfortably in exam chair, well nourished, well developed, in no acute distress with non-toxic appearance Resp: breathing comfortably on room air, speaking in full sentences MSK:  gait normal Neuro: Alert and oriented, speech normal  Assessment & Plan:   Stress incontinence History appears most consistent with stress incontinence in setting of 3 prior vaginal deliveries.  Recommended conservative management at this time with plan for referral to urology if no improvement within 3 months.  avoid alcohol, caffeine, and carbonated beverages as this can exacerbate symptoms  Treat constipation with high fiber diet or MiraLAX daily  Avoid smoking  Perform Kegel exercises TID  trial of topical vaginal estrogen 0.5 grams: apply vaginally daily for 2 weeks then twice a week  If no improvement in 3 months with above  recommendations plan for referral to urologist for further evaluation  No orders of the defined types were placed in this encounter.  Meds ordered this encounter  Medications  . conjugated estrogens (PREMARIN) vaginal cream    Sig: Apply vaginally daily x 2 weeks then twice weekly thereafter    Dispense:  42.5 g    Refill:  12    Orpah Cobb, DO PGY-3, [facility] 02/12/2020 2:49 PM

## 2020-02-12 NOTE — Patient Instructions (Addendum)
It was a pleasure to see you today!  Thank you for choosing [facility] for your primary care.  [name] [name] was seen for bladder leakage  Our plans for today were:  Try to avoid alcohol, caffeine, and carbonated beverages as this can exacerbate symptoms  If you have constipation I recommend using MiraLAX daily  Avoid smoking  Perform Kegel exercises daily: 2 8-12 contractions holding each 8 to 10 seconds doing this 3 times a day   trial topical vaginal estrogen 0.5grams: apply vaginally daily for 2 weeks then twice a week  If no improvement in 3 months with above recommendations we can discuss referral to urologist for further discussion  Best Wishes,   Mina Marble, DO

## 2020-02-12 NOTE — Assessment & Plan Note (Signed)
History appears most consistent with stress incontinence in setting of 3 prior vaginal deliveries.  Recommended conservative management at this time with plan for referral to urology if no improvement within 3 months.  avoid alcohol, caffeine, and carbonated beverages as this can exacerbate symptoms  Treat constipation with high fiber diet or MiraLAX daily  Avoid smoking  Perform Kegel exercises TID  trial of topical vaginal estrogen 0.5 grams: apply vaginally daily for 2 weeks then twice a week  If no improvement in 3 months with above recommendations plan for referral to urologist for further evaluation

## 2020-02-27 ENCOUNTER — Encounter: Payer: Self-pay | Admitting: *Deleted

## 2020-02-29 ENCOUNTER — Other Ambulatory Visit: Payer: Self-pay | Admitting: *Deleted

## 2020-02-29 MED ORDER — FERROUS SULFATE 325 (65 FE) MG PO TABS: ORAL_TABLET | ORAL | 1 refills | Status: DC

## 2020-03-08 ENCOUNTER — Other Ambulatory Visit: Payer: Self-pay | Admitting: Family Medicine

## 2020-03-08 DIAGNOSIS — I2510 Atherosclerotic heart disease of native coronary artery without angina pectoris: Secondary | ICD-10-CM

## 2020-03-11 ENCOUNTER — Other Ambulatory Visit: Payer: Self-pay | Admitting: *Deleted

## 2020-03-12 MED ORDER — GABAPENTIN 100 MG PO CAPS: 200.0000 mg | ORAL_CAPSULE | Freq: Every day | ORAL | 1 refills | Status: DC

## 2020-04-21 NOTE — Progress Notes (Signed)
    SUBJECTIVE:   CHIEF COMPLAINT / HPI: fatigue and difficulty sleeping.  Would also like referral for colonoscopy  Difficulty sleeping Has been ongoing for about 1 year.  Sleeps from 1030 pm -1130 am.  Takes 20 min- 2 hrs to get to sleep.  Denies any late night eating, or caffeine intake.  Denies any daytime naps but reports falling asleep watching TV for a few minutes.  Feels fatigued sometimes.  Endorses snoring at night.  Has never had sleep studies in the past.   HTN Asymptomatic.  Does not check BP at home.  Denies any chest pain, headaches or visual disturbances. Not on any medications.  Was previously on Amodipine but was stopped secondary to orthostatic hypotension.  PERTINENT  PMH / PSH:  PreDiabetes HTN   OBJECTIVE:   BP 132/62   Pulse 64   Ht 5\' 2"  (1.575 m)   Wt 175 lb 6.4 oz (79.6 kg)   BMI 32.08 kg/m    General: Alert, no acute distress Cardio: Normal S1 and S2, RRR, no r/m/g Pulm: CTAB, normal work of breathing Abdomen: Bowel sounds normal. Abdomen soft and non-tender.  Extremities: No peripheral edema.  Neuro: Cranial nerves grossly intact    ASSESSMENT/PLAN:   Prediabetes A1c 6.2 today, increase from previous 5.9.   Discuss diet and exercise Repeat in 3/12 If continues to elevated will discuss initiating Metformin  Essential hypertension Normotensive.  Not currently on antihypertensive medications.   Will recheck at next visit Could consider starting low dose Losartan if A1c increases to Diabetic range or BP increases. Follow up 3/12  Difficulty sleeping Continues to have difficulty sleeping and feeling tired.  Discussed sleep hygiene and plans to practice this prior to referral for sleep studies.   Has not had sleep studies done in the past. TSH, Vit D, CBC labs today Follow up if symptoms worsen  Healthcare maintenance Covid vaccine: Fully vaccinated Flu vaccine: received today Colonoscopy: ordered today Mammogram: ordered today Pap: n/a  last one inn 2013 and HPV negative Eye Exam: 12/20 per patient Foot Exam: at next visit  HYPERCHOLESTEROLEMIA Last LDL 102.  Currently taking Atorvastatin 80 mg and Zetia 10 mg daily. Repeat Lipid Profile today Maintain LDL < 70  Microcytic anemia Chronic Feeling of fatigue.  Review of chart show Hbg wnl, low MCV and Elevated RBC. Menzter score <13 indicates Thalassemia Repeat CBC today Follow up in 3/12     Carollee Leitz, MD [place]

## 2020-04-21 NOTE — Patient Instructions (Addendum)
Thank you for coming to see me today. It was a pleasure.   You are due for a colonoscopy.  Please use the form that we have given you to schedule this at your convenience.   I have placed an order for your mammogram.  Please call [facility] at [phone] to schedule your appointment within one week.  I have placed an order for CT chest.  Please go to [place] at [employer] or at [facility] to have this completed.  You do not need an appointment, but if you would like to call them beforehand, their number is [phone].  We will contact you with your results afterwards.  We will get some labs today.  If they are abnormal or we need to do something about them, I will call you.  If they are normal, I will send you a message on MyChart (if it is active) or a letter in the mail.  If you don't hear from [place] in 2 weeks, please call the office at the number below.  You do not need a PAP smear anymore.  Please follow-up with PCP for Diabetic foot exam in the near future  If you have any questions or concerns, please do not hesitate to call the office at [phone].  Best,   Carollee Leitz, MD   What can I do to improve my insomnia? -- You can follow good "sleep hygiene." That means that you: ?Sleep only long enough to feel rested and then get out of bed ?Go to bed and get up at the same time every day ?Do not try to force yourself to sleep. If you can't sleep, get out of bed and try again later. ?Have coffee, tea, and other foods that have caffeine only in the morning ?Avoid alcohol in the late afternoon, evening, and bedtime ?Avoid smoking, especially in the evening ?Keep your bedroom dark, cool, quiet, and free of reminders of work or other things that cause you stress ?Solve problems you have before you go to bed ?Exercise several days a week, but not right before bed ?Avoid looking at phones or reading devices ("e-books") that give off light before bed.  This can make it harder to fall asleep. Other things that can improve sleep include: ?Relaxation therapy, in which you focus on relaxing all the muscles in your body 1 by 1 ?Working with a Engineer, materials to deal with the problems that might be causing poor sleep  Sleep hygiene guidelines Recommendation Details  Regular bedtime and rise time Having a consistent bedtime and rise time leads to more regular sleep schedules and avoids periods of sleep deprivation or periods of extended wakefulness during the night.  Avoid napping Avoid napping, especially naps lasting longer than 1 hour and naps late in the day.  Limit caffeine Avoid caffeine after lunch. The time between lunch and bedtime represents approximately 2 half-lives for caffeine, and this time window allows for most caffeine to be metabolized before bedtime.  Limit alcohol Recommendations are typically focused on avoiding alcohol near bedtime. Alcohol is initially sedating, but activating as it is metabolized. Alcohol also negatively impacts sleep architecture.  Avoid nicotine Nicotine is a stimulant and should be avoided near bedtime and at night.  Exercise Daytime physical activity is encouraged, in particular, 4 to 6 hours before bedtime, as this may facilitate sleep onset. Rigorous exercise within 2 hours of bedtime is discouraged.  Keep the sleep environment quiet and dark Noise and light  exposure during the night can disrupt sleep. White noise or ear plugs are often recommended to reduce noise. Using blackout shades or an eye mask is commonly recommended to reduce light. This may also include avoiding exposure to television or technology near bedtime, as this can have an impact on circadian rhythms by shifting sleep timing later.   Bedroom clock Avoid checking the time at night. This includes alarm clocks and other time pieces (eg, watches and smart phones). Checking the time increases cognitive arousal and prolongs wakefulness.   Evening eating Avoid a large meal near bedtime, but don't go to bed hungry. Eat a healthy and filling meal in the evening and avoid late-night snacks.

## 2020-04-22 ENCOUNTER — Other Ambulatory Visit: Payer: Self-pay

## 2020-04-22 ENCOUNTER — Ambulatory Visit (INDEPENDENT_AMBULATORY_CARE_PROVIDER_SITE_OTHER): Payer: Medicare Other | Admitting: Family Medicine

## 2020-04-22 ENCOUNTER — Encounter: Payer: Self-pay | Admitting: Family Medicine

## 2020-04-22 VITALS — BP 132/62 | HR 64 | Ht 62.0 in | Wt 175.4 lb

## 2020-04-22 DIAGNOSIS — Z87891 Personal history of nicotine dependence: Secondary | ICD-10-CM

## 2020-04-22 DIAGNOSIS — G479 Sleep disorder, unspecified: Secondary | ICD-10-CM

## 2020-04-22 DIAGNOSIS — R7303 Prediabetes: Secondary | ICD-10-CM | POA: Diagnosis not present

## 2020-04-22 DIAGNOSIS — E785 Hyperlipidemia, unspecified: Secondary | ICD-10-CM

## 2020-04-22 DIAGNOSIS — Z1211 Encounter for screening for malignant neoplasm of colon: Secondary | ICD-10-CM

## 2020-04-22 DIAGNOSIS — I2510 Atherosclerotic heart disease of native coronary artery without angina pectoris: Secondary | ICD-10-CM

## 2020-04-22 DIAGNOSIS — R5383 Other fatigue: Secondary | ICD-10-CM | POA: Diagnosis not present

## 2020-04-22 DIAGNOSIS — I10 Essential (primary) hypertension: Secondary | ICD-10-CM

## 2020-04-22 DIAGNOSIS — Z1231 Encounter for screening mammogram for malignant neoplasm of breast: Secondary | ICD-10-CM

## 2020-04-22 DIAGNOSIS — D509 Iron deficiency anemia, unspecified: Secondary | ICD-10-CM

## 2020-04-22 DIAGNOSIS — E559 Vitamin D deficiency, unspecified: Secondary | ICD-10-CM

## 2020-04-22 DIAGNOSIS — E7800 Pure hypercholesterolemia, unspecified: Secondary | ICD-10-CM

## 2020-04-22 DIAGNOSIS — Z23 Encounter for immunization: Secondary | ICD-10-CM

## 2020-04-22 DIAGNOSIS — Z0000 Encounter for general adult medical examination without abnormal findings: Secondary | ICD-10-CM

## 2020-04-22 LAB — POCT GLYCOSYLATED HEMOGLOBIN (HGB A1C): Hemoglobin A1C: 6.2 % — AB (ref 4.0–5.6)

## 2020-04-23 ENCOUNTER — Encounter: Payer: Self-pay | Admitting: Family Medicine

## 2020-04-23 ENCOUNTER — Telehealth: Payer: Self-pay | Admitting: Family Medicine

## 2020-04-23 DIAGNOSIS — Z0000 Encounter for general adult medical examination without abnormal findings: Secondary | ICD-10-CM | POA: Insufficient documentation

## 2020-04-23 LAB — BASIC METABOLIC PANEL
BUN/Creatinine Ratio: 14 (ref 12–28)
BUN: 13 mg/dL (ref 8–27)
CO2: 19 mmol/L — ABNORMAL LOW (ref 20–29)
Calcium: 9.6 mg/dL (ref 8.7–10.3)
Chloride: 106 mmol/L (ref 96–106)
Creatinine, Ser: 0.96 mg/dL (ref 0.57–1.00)
GFR calc Af Amer: 69 mL/min/{1.73_m2} (ref >59)
GFR calc non Af Amer: 60 mL/min/{1.73_m2} (ref >59)
Glucose: 96 mg/dL (ref 65–99)
Potassium: 4.6 mmol/L (ref 3.5–5.2)
Sodium: 139 mmol/L (ref 134–144)

## 2020-04-23 LAB — LIPID PANEL
Chol/HDL Ratio: 2.2 ratio (ref 0.0–4.4)
Cholesterol, Total: 149 mg/dL (ref 100–199)
HDL: 69 mg/dL (ref >39)
LDL Chol Calc (NIH): 61 mg/dL (ref 0–99)
Triglycerides: 103 mg/dL (ref 0–149)
VLDL Cholesterol Cal: 19 mg/dL (ref 5–40)

## 2020-04-23 LAB — CBC WITH DIFFERENTIAL
Basophils Absolute: 0.1 10*3/uL (ref 0.0–0.2)
Basos: 1 %
EOS (ABSOLUTE): 0.2 10*3/uL (ref 0.0–0.4)
Eos: 3 %
Hematocrit: 43.2 % (ref 34.0–46.6)
Hemoglobin: 13.6 g/dL (ref 11.1–15.9)
Immature Grans (Abs): 0.1 10*3/uL (ref 0.0–0.1)
Immature Granulocytes: 1 %
Lymphocytes Absolute: 2.5 10*3/uL (ref 0.7–3.1)
Lymphs: 33 %
MCH: 22.7 pg — ABNORMAL LOW (ref 26.6–33.0)
MCHC: 31.5 g/dL (ref 31.5–35.7)
MCV: 72 fL — ABNORMAL LOW (ref 79–97)
Monocytes Absolute: 0.4 10*3/uL (ref 0.1–0.9)
Monocytes: 6 %
Neutrophils Absolute: 4.2 10*3/uL (ref 1.4–7.0)
Neutrophils: 56 %
RBC: 6 x10E6/uL — ABNORMAL HIGH (ref 3.77–5.28)
RDW: 15.6 % — ABNORMAL HIGH (ref 11.7–15.4)
WBC: 7.4 10*3/uL (ref 3.4–10.8)

## 2020-04-23 LAB — VITAMIN D 25 HYDROXY (VIT D DEFICIENCY, FRACTURES): Vit D, 25-Hydroxy: 10.6 ng/mL — ABNORMAL LOW (ref 30.0–100.0)

## 2020-04-23 LAB — TSH: TSH: 0.82 u[IU]/mL (ref 0.450–4.500)

## 2020-04-23 MED ORDER — NITROGLYCERIN 0.4 MG SL SUBL: 0.4000 mg | SUBLINGUAL_TABLET | SUBLINGUAL | 7 refills | Status: DC | PRN

## 2020-04-23 NOTE — Assessment & Plan Note (Addendum)
Continues to have difficulty sleeping and feeling tired.  Discussed sleep hygiene and plans to practice this prior to referral for sleep studies.   Has not had sleep studies done in the past. TSH, Vit D, CBC labs today Follow up if symptoms worsen

## 2020-04-23 NOTE — Assessment & Plan Note (Signed)
Covid vaccine: Fully vaccinated Flu vaccine: received today Colonoscopy: ordered today Mammogram: ordered today Pap: n/a last one inn 2013 and HPV negative Eye Exam: 12/20 per patient Foot Exam: at next visit

## 2020-04-23 NOTE — Assessment & Plan Note (Addendum)
Last LDL 102.  Currently taking Atorvastatin 80 mg and Zetia 10 mg daily. Repeat Lipid Profile today Maintain LDL < 70

## 2020-04-23 NOTE — Assessment & Plan Note (Signed)
Chronic Feeling of fatigue.  Review of chart show Hbg wnl, low MCV and Elevated RBC. Menzter score <13 indicates Thalassemia Repeat CBC today Follow up in 3/12

## 2020-04-23 NOTE — Assessment & Plan Note (Signed)
A1c 6.2 today, increase from previous 5.9.   Discuss diet and exercise Repeat in 3/12 If continues to elevated will discuss initiating Metformin

## 2020-04-23 NOTE — Assessment & Plan Note (Signed)
Normotensive.  Not currently on antihypertensive medications.   Will recheck at next visit Could consider starting low dose Losartan if A1c increases to Diabetic range or BP increases. Follow up 3/12

## 2020-04-24 NOTE — Telephone Encounter (Signed)
Pt being called by front office.[name] [name], CMA

## 2020-05-06 ENCOUNTER — Other Ambulatory Visit: Payer: Self-pay | Admitting: Family Medicine

## 2020-05-14 ENCOUNTER — Ambulatory Visit: Payer: Medicare Other | Admitting: Family Medicine

## 2020-05-14 ENCOUNTER — Other Ambulatory Visit: Payer: Self-pay

## 2020-05-14 ENCOUNTER — Encounter: Payer: Self-pay | Admitting: Family Medicine

## 2020-05-14 DIAGNOSIS — Z0000 Encounter for general adult medical examination without abnormal findings: Secondary | ICD-10-CM

## 2020-05-14 DIAGNOSIS — E119 Type 2 diabetes mellitus without complications: Secondary | ICD-10-CM | POA: Diagnosis not present

## 2020-05-14 NOTE — Patient Instructions (Signed)
Thank you for coming to see me today. It was a pleasure.  Foot exam was normal.  Continue to watch your diet and exercise.     Please follow-up with PCP as needed  If you have any questions or concerns, please do not hesitate to call the office at [phone].  Best,   Carollee Leitz, MD [facility]

## 2020-05-14 NOTE — Progress Notes (Signed)
    SUBJECTIVE:   CHIEF COMPLAINT / HPI: Foot exam  No acute concerns today.  Colonoscopy and Mammogram scheduled in the near future.  PERTINENT  PMH / PSH:  Prediabetes  OBJECTIVE:   BP 122/80   Pulse (!) 58   Wt 178 lb 6.4 oz (80.9 kg)   SpO2 97%   BMI 32.63 kg/m    Diabetic foot exam: No deformities, ulcerations, or other skin breakdown on feet bilaterally.  Sensation intact to monofilament and light touch.  PT and DP pulses intact BL.    ASSESSMENT/PLAN:   Encounter for diabetic foot exam ([place]) Normal foot exam. Follow up in 1 year     Carollee Leitz, MD [place]

## 2020-05-16 ENCOUNTER — Encounter: Payer: Self-pay | Admitting: Family Medicine

## 2020-05-18 ENCOUNTER — Encounter: Payer: Self-pay | Admitting: Family Medicine

## 2020-05-18 DIAGNOSIS — Z0000 Encounter for general adult medical examination without abnormal findings: Secondary | ICD-10-CM | POA: Insufficient documentation

## 2020-05-18 DIAGNOSIS — E119 Type 2 diabetes mellitus without complications: Secondary | ICD-10-CM | POA: Insufficient documentation

## 2020-05-18 NOTE — Assessment & Plan Note (Signed)
Normal foot exam. Follow up in 1 year

## 2020-06-25 ENCOUNTER — Ambulatory Visit (AMBULATORY_SURGERY_CENTER): Payer: Self-pay | Admitting: *Deleted

## 2020-06-25 ENCOUNTER — Other Ambulatory Visit: Payer: Self-pay | Admitting: Family Medicine

## 2020-06-25 ENCOUNTER — Other Ambulatory Visit: Payer: Self-pay

## 2020-06-25 VITALS — Ht 62.0 in | Wt 178.6 lb

## 2020-06-25 DIAGNOSIS — Z1211 Encounter for screening for malignant neoplasm of colon: Secondary | ICD-10-CM

## 2020-06-25 MED ORDER — PEG 3350-KCL-NA BICARB-NACL 420 G PO SOLR: 4000.0000 mL | Freq: Once | ORAL | 0 refills | Status: AC

## 2020-06-25 NOTE — Progress Notes (Signed)
  No trouble with anesthesia, denies being told they were difficult to intubate, or hx/fam hx of malignant hyperthermia per pt   No egg or soy allergy  No home oxygen use   No medications for weight loss taken  emmi information given  Pt denies constipation issues  Pt informed that we do not do prior authorizations for prep  Spoke with pharmacy tech at [place]- [facility] is available.

## 2020-07-05 ENCOUNTER — Encounter: Payer: Self-pay | Admitting: Gastroenterology

## 2020-07-09 ENCOUNTER — Ambulatory Visit (AMBULATORY_SURGERY_CENTER): Payer: Medicare Other | Admitting: Gastroenterology

## 2020-07-09 ENCOUNTER — Other Ambulatory Visit: Payer: Self-pay

## 2020-07-09 ENCOUNTER — Encounter: Payer: Self-pay | Admitting: Gastroenterology

## 2020-07-09 VITALS — BP 123/62 | HR 55 | Temp 97.8°F | Resp 12 | Ht 62.0 in | Wt 178.6 lb

## 2020-07-09 DIAGNOSIS — Z1211 Encounter for screening for malignant neoplasm of colon: Secondary | ICD-10-CM | POA: Diagnosis not present

## 2020-07-09 DIAGNOSIS — D122 Benign neoplasm of ascending colon: Secondary | ICD-10-CM

## 2020-07-09 MED ORDER — SODIUM CHLORIDE 0.9 % IV SOLN: 500.0000 mL | Freq: Once | INTRAVENOUS | Status: DC

## 2020-07-09 NOTE — Progress Notes (Signed)
VS by CW  Pt's states no medical or surgical changes since previsit or office visit.  

## 2020-07-09 NOTE — Patient Instructions (Signed)
Handout given for polyps.  Await pathology results.  YOU HAD AN ENDOSCOPIC PROCEDURE TODAY AT [facility]:   Refer to the procedure report that was given to you for any specific questions about what was found during the examination.  If the procedure report does not answer your questions, please call your gastroenterologist to clarify.  If you requested that your care partner not be given the details of your procedure findings, then the procedure report has been included in a sealed envelope for you to review at your convenience later.  YOU SHOULD EXPECT: Some feelings of bloating in the abdomen. Passage of more gas than usual.  Walking can help get rid of the air that was put into your GI tract during the procedure and reduce the bloating. If you had a lower endoscopy (such as a colonoscopy or flexible sigmoidoscopy) you may notice spotting of blood in your stool or on the toilet paper. If you underwent a bowel prep for your procedure, you may not have a normal bowel movement for a few days.  Please Note:  You might notice some irritation and congestion in your nose or some drainage.  This is from the oxygen used during your procedure.  There is no need for concern and it should clear up in a day or so.  SYMPTOMS TO REPORT IMMEDIATELY:   Following lower endoscopy (colonoscopy or flexible sigmoidoscopy):  Excessive amounts of blood in the stool  Significant tenderness or worsening of abdominal pains  Swelling of the abdomen that is new, acute  Fever of 100F or higher  For urgent or emergent issues, a gastroenterologist can be reached at any hour by calling [phone]. Do not use MyChart messaging for urgent concerns.    DIET:  We do recommend a small meal at first, but then you may proceed to your regular diet.  Drink plenty of fluids but you should avoid alcoholic beverages for 24 hours.  ACTIVITY:  You should plan to take it easy for the rest of today and you should  NOT DRIVE or use heavy machinery until tomorrow (because of the sedation medicines used during the test).    FOLLOW UP: Our staff will call the number listed on your records 48-72 hours following your procedure to check on you and address any questions or concerns that you may have regarding the information given to you following your procedure. If we do not reach you, we will leave a message.  We will attempt to reach you two times.  During this call, we will ask if you have developed any symptoms of COVID 19. If you develop any symptoms (ie: fever, flu-like symptoms, shortness of breath, cough etc.) before then, please call [phone].  If you test positive for Covid 19 in the 2 weeks post procedure, please call and report this information to [place].    If any biopsies were taken you will be contacted by phone or by letter within the next 1-3 weeks.  Please call us at [phone] if you have not heard about the biopsies in 3 weeks.    SIGNATURES/CONFIDENTIALITY: You and/or your care partner have signed paperwork which will be entered into your electronic medical record.  These signatures attest to the fact that that the information above on your After Visit Summary has been reviewed and is understood.  Full responsibility of the confidentiality of this discharge information lies with you and/or your care-partner.

## 2020-07-09 NOTE — Progress Notes (Signed)
Called to room to assist during endoscopic procedure.  Patient ID and intended procedure confirmed with present staff. Received instructions for my participation in the procedure from the performing physician.  

## 2020-07-09 NOTE — Progress Notes (Signed)
PT taken to PACU. Monitors in place. VSS. Report given to RN. 

## 2020-07-09 NOTE — Op Note (Signed)
[facility] Patient Name: [name] [name] Procedure Date: 07/09/2020 10:42 AM MRN: [id number] Endoscopist: Meryl Dare , MD Age: 70 Referring MD:  Date of Birth: 1950-11-25 Gender: Female Account #: [id number] Procedure:                Colonoscopy Indications:              Screening for colorectal malignant neoplasm Medicines:                Monitored Anesthesia Care Procedure:                Pre-Anesthesia Assessment:                           - Prior to the procedure, a History and Physical                            was performed, and patient medications and                            allergies were reviewed. The patient's tolerance of                            previous anesthesia was also reviewed. The risks                            and benefits of the procedure and the sedation                            options and risks were discussed with the patient.                            All questions were answered, and informed consent                            was obtained. Prior Anticoagulants: The patient has                            taken no previous anticoagulant or antiplatelet                            agents. ASA Grade Assessment: III - A patient with                            severe systemic disease. After reviewing the risks                            and benefits, the patient was deemed in                            satisfactory condition to undergo the procedure.                           After obtaining informed consent, the colonoscope  was passed under direct vision. Throughout the                            procedure, the patient's blood pressure, pulse, and                            oxygen saturations were monitored continuously. The                            Olympus [id number]) [id number] was introduced                            through the anus and advanced to the the cecum,                             identified by appendiceal orifice and ileocecal                            valve. The ileocecal valve, appendiceal orifice,                            and rectum were photographed. The quality of the                            bowel preparation was good. The colonoscopy was                            performed without difficulty. The patient tolerated                            the procedure well. Scope In: 10:50:37 AM Scope Out: 11:04:44 AM Scope Withdrawal Time: 0 hours 11 minutes 46 seconds  Total Procedure Duration: 0 hours 14 minutes 7 seconds  Findings:                 The perianal and digital rectal examinations were                            normal.                           Three sessile polyps were found in the ascending                            colon. The polyps were 5 to 7 mm in size. These                            polyps were removed with a cold snare. Resection                            and retrieval were complete.                           The exam was otherwise without abnormality on  direct and retroflexion views. Complications:            No immediate complications. Estimated blood loss:                            None. Estimated Blood Loss:     Estimated blood loss: none. Impression:               - Three 5 to 7 mm polyps in the ascending colon,                            removed with a cold snare. Resected and retrieved.                           - The examination was otherwise normal on direct                            and retroflexion views. Recommendation:           - Repeat colonoscopy after studies are complete for                            surveillance based on pathology results.                           - Patient has a contact number available for                            emergencies. The signs and symptoms of potential                            delayed complications were discussed with the                             patient. Return to normal activities tomorrow.                            Written discharge instructions were provided to the                            patient.                           - Resume previous diet.                           - Continue present medications.                           - Await pathology results. Meryl Dare, MD 07/09/2020 11:08:20 AM This report has been signed electronically.

## 2020-07-11 ENCOUNTER — Telehealth: Payer: Self-pay | Admitting: *Deleted

## 2020-07-11 NOTE — Telephone Encounter (Signed)
  Follow up Call-  Call back number 07/09/2020  Post procedure Call Back phone  # [phone]  Permission to leave phone message Yes  Some recent data might be hidden     Patient questions:  Do you have a fever, pain , or abdominal swelling? No. Pain Score  0 *  Have you tolerated food without any problems? Yes.    Have you been able to return to your normal activities? Yes.    Do you have any questions about your discharge instructions: Diet   No. Medications  No. Follow up visit  No.  Do you have questions or concerns about your Care? No.  Actions: * If pain score is 4 or above: 1. No action needed, pain <4.Have you developed a fever since your procedure? no  2.   Have you had an respiratory symptoms (SOB or cough) since your procedure? no  3.   Have you tested positive for COVID 19 since your procedure no  4.   Have you had any family members/close contacts diagnosed with the COVID 19 since your procedure?  no   If yes to any of these questions please route to Joylene John, RN and Joella Prince, RN

## 2020-07-23 ENCOUNTER — Encounter: Payer: Self-pay | Admitting: Gastroenterology

## 2020-09-10 NOTE — Progress Notes (Deleted)
HPI: FU CAD. Also H/O MVP and orthostasis. Carotid Dopplers 2010 showed no significant stenosis. Echocardiogram repeated 9/16 and showed normal LV function. Patient had chest pain followed by abnormal nuclear study. Patient then had cardiac catheterization December 2018 that showed an 80% ostial ramus and ejection fraction 50-55%. Patient had PCI with drug-eluting stent. Since last seen, the patient denies any dyspnea on exertion, orthopnea, PND, pedal edema, palpitations, syncope or chest pain.  Current Outpatient Medications  Medication Sig Dispense Refill   amLODipine (NORVASC) 5 MG tablet TAKE 1 TABLET BY MOUTH EVERY DAY 90 tablet 3   aspirin 81 MG tablet Take 81 mg by mouth daily.     atorvastatin (LIPITOR) 80 MG tablet TAKE 1 TABLET BY MOUTH EVERY DAY 90 tablet 3   conjugated estrogens (PREMARIN) vaginal cream Apply vaginally daily x 2 weeks then twice weekly thereafter 42.5 g 12   cyclobenzaprine (FLEXERIL) 10 MG tablet TAKE 1 TABLET (10 MG TOTAL) BY MOUTH DAILY AS NEEDED FOR MUSCLE SPASMS. (Patient not taking: Reported on 07/09/2020) 30 tablet 1   ezetimibe (ZETIA) 10 MG tablet Take 1 tablet (10 mg total) by mouth daily. 90 tablet 3   ferrous sulfate 325 (65 FE) MG tablet TAKE 1 TABLET BY MOUTH EVERY DAY WITH BREAKFAST 90 tablet 1   fluticasone (FLONASE) 50 MCG/ACT nasal spray Place 1 spray daily into both nostrils.     gabapentin (NEURONTIN) 100 MG capsule Take 2 capsules (200 mg total) by mouth at bedtime. 180 capsule 1   metoprolol tartrate (LOPRESSOR) 25 MG tablet TAKE 0.5 TABLETS (12.5 MG TOTAL) BY MOUTH 2 (TWO) TIMES DAILY. 90 tablet 4   nitroGLYCERIN (NITROSTAT) 0.4 MG SL tablet Place 1 tablet (0.4 mg total) under the tongue every 5 (five) minutes as needed for chest pain. 25 tablet 7   pantoprazole (PROTONIX) 40 MG tablet TAKE 1 TABLET BY MOUTH TWICE A DAY 180 tablet 3   prednisoLONE acetate (PRED FORTE) 1 % ophthalmic suspension 1 drop 3 (three) times daily.     sertraline  (ZOLOFT) 100 MG tablet TAKE 1 TABLET BY MOUTH EVERY DAY 90 tablet 0   No current facility-administered medications for this visit.     Past Medical History:  Diagnosis Date   Arthritis    Blood transfusion without reported diagnosis    Cataract    Coronary artery disease    Depression    Hyperlipidemia    Hypertension    Microcytic anemia    Mitral valve prolapse     Past Surgical History:  Procedure Laterality Date   ABDOMINAL HYSTERECTOMY     CARDIAC CATHETERIZATION     CATARACT EXTRACTION, BILATERAL     COLONOSCOPY     greater than 10 years ago   CORONARY STENT INTERVENTION N/A 03/01/2017   Procedure: CORONARY STENT INTERVENTION;  Surgeon: Burnell Blanks, MD;  Location: [facility];  Service: Cardiovascular;  Laterality: N/A;   INTRAVASCULAR PRESSURE WIRE/FFR STUDY N/A 03/01/2017   Procedure: INTRAVASCULAR PRESSURE WIRE/FFR STUDY;  Surgeon: Burnell Blanks, MD;  Location: [facility];  Service: Cardiovascular;  Laterality: N/A;   LEFT HEART CATH AND CORONARY ANGIOGRAPHY N/A 03/01/2017   Procedure: LEFT HEART CATH AND CORONARY ANGIOGRAPHY;  Surgeon: Burnell Blanks, MD;  Location: [facility];  Service: Cardiovascular;  Laterality: N/A;   PARTIAL HYSTERECTOMY      Social History   Socioeconomic History   Marital status: Married    Spouse name: Quita Skye  Number of children: 3   Years of education: 12   Highest education level: High school graduate  Occupational History    Comment: Retired  Tobacco Use   Smoking status: Former    Packs/day: 0.50    Years: 20.00    Pack years: 10.00    Types: Cigarettes    Quit date: 07/30/2004    Years since quitting: 16.1   Smokeless tobacco: Never  Vaping Use   Vaping Use: Never used  Substance and Sexual Activity   Alcohol use: Yes    Alcohol/week: 0.0 standard drinks    Comment: Occasional   Drug use: No   Sexual activity: Yes    Partners: Male    Birth control/protection:  Post-menopausal  Other Topics Concern   Not on file  Social History Narrative   Patient lives in [place] with her husband and niece.   Patient has 3 daughters she is close with. One lives right next door.    Patient enjoys sewing, cooking, reading, and outdoor activities.    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    Social Determinants of Health   Financial Resource Strain: Not on file  Food Insecurity: Not on file  Transportation Needs: Not on file  Physical Activity: Not on file  Stress: Not on file  Social Connections: Not on file  Intimate Partner Violence: Not on file    Family History  Problem Relation Age of Onset   Heart disease Mother        CHF   Diabetes Mother    Hypertension Mother    Cancer Father    Hypertension Sister    Pancreatitis Sister    Dementia Sister    Esophageal cancer Sister    Esophageal cancer Brother    Colon cancer Neg Hx    Stomach cancer Neg Hx    Rectal cancer Neg Hx     ROS: no fevers or chills, productive cough, hemoptysis, dysphasia, odynophagia, melena, hematochezia, dysuria, hematuria, rash, seizure activity, orthopnea, PND, pedal edema, claudication. Remaining systems are negative.  Physical Exam: Well-developed well-nourished in no acute distress.  Skin is warm and dry.  HEENT is normal.  Neck is supple.  Chest is clear to auscultation with normal expansion.  Cardiovascular exam is regular rate and rhythm.  Abdominal exam nontender or distended. No masses palpated. Extremities show no edema. neuro grossly intact  ECG- personally reviewed  A/P  1 coronary artery disease-patient doing well with no chest pain.  Plan to continue medical therapy with aspirin and statin.  2 hypertension-patient's blood pressure is controlled.  Continue present medications.  3 hyperlipidemia-continue statin.  4 history of orthostasis-symptoms are controlled at present.  Continue to encourage increase p.o.  fluid intake and sodium intake.  [name], MD

## 2020-09-19 ENCOUNTER — Ambulatory Visit: Payer: Medicare Other | Admitting: Cardiology

## 2020-09-20 ENCOUNTER — Other Ambulatory Visit: Payer: Self-pay | Admitting: Cardiology

## 2020-10-07 ENCOUNTER — Other Ambulatory Visit: Payer: Self-pay | Admitting: Cardiology

## 2020-10-10 NOTE — Progress Notes (Signed)
HPI: FU CAD. Also H/O MVP and orthostasis. Carotid Dopplers 2010 showed no significant stenosis. Echocardiogram repeated 9/16 and showed normal LV function. Patient had chest pain followed by abnormal nuclear study. Patient then had cardiac catheterization December 2018 that showed an 80% ostial ramus and ejection fraction 50-55%. Patient had PCI with drug-eluting stent. Since last seen, patient denies dyspnea on exertion, orthopnea, PND, pedal edema, syncope or exertional chest pain.  However she has had spells of her heart fluttering associated with dizziness.  These are sudden in onset and lasted approximately 1 minute.  They resolve spontaneously.  She has not had associated syncope.  Current Outpatient Medications  Medication Sig Dispense Refill   amLODipine (NORVASC) 5 MG tablet TAKE 1 TABLET BY MOUTH EVERY DAY 90 tablet 3   amoxicillin (AMOXIL) 500 MG capsule Take 500 mg by mouth 4 (four) times daily.     aspirin 81 MG tablet Take 81 mg by mouth daily.     atorvastatin (LIPITOR) 80 MG tablet TAKE 1 TABLET BY MOUTH EVERY DAY 90 tablet 3   conjugated estrogens (PREMARIN) vaginal cream Apply vaginally daily x 2 weeks then twice weekly thereafter 42.5 g 12   cyclobenzaprine (FLEXERIL) 10 MG tablet TAKE 1 TABLET (10 MG TOTAL) BY MOUTH DAILY AS NEEDED FOR MUSCLE SPASMS. 30 tablet 1   ezetimibe (ZETIA) 10 MG tablet Take 1 tablet (10 mg total) by mouth daily. 90 tablet 3   ferrous sulfate 325 (65 FE) MG tablet TAKE 1 TABLET BY MOUTH EVERY DAY WITH BREAKFAST 90 tablet 1   fluticasone (FLONASE) 50 MCG/ACT nasal spray Place 1 spray daily into both nostrils.     gabapentin (NEURONTIN) 100 MG capsule Take 2 capsules (200 mg total) by mouth at bedtime. 180 capsule 1   metoprolol tartrate (LOPRESSOR) 25 MG tablet TAKE 0.5 TABLETS (12.5 MG TOTAL) BY MOUTH 2 (TWO) TIMES DAILY. 90 tablet 4   pantoprazole (PROTONIX) 40 MG tablet TAKE 1 TABLET BY MOUTH TWICE A DAY 180 tablet 3   sertraline (ZOLOFT) 100  MG tablet TAKE 1 TABLET BY MOUTH EVERY DAY 90 tablet 0   nitroGLYCERIN (NITROSTAT) 0.4 MG SL tablet Place 1 tablet (0.4 mg total) under the tongue every 5 (five) minutes as needed for chest pain. 25 tablet 7   prednisoLONE acetate (PRED FORTE) 1 % ophthalmic suspension 1 drop 3 (three) times daily. (Patient not taking: Reported on 10/11/2020)     No current facility-administered medications for this visit.     Past Medical History:  Diagnosis Date   Arthritis    Blood transfusion without reported diagnosis    Cataract    Coronary artery disease    Depression    Hyperlipidemia    Hypertension    Microcytic anemia    Mitral valve prolapse     Past Surgical History:  Procedure Laterality Date   ABDOMINAL HYSTERECTOMY     CARDIAC CATHETERIZATION     CATARACT EXTRACTION, BILATERAL     COLONOSCOPY     greater than 10 years ago   CORONARY STENT INTERVENTION N/A 03/01/2017   Procedure: CORONARY STENT INTERVENTION;  Surgeon: Burnell Blanks, MD;  Location: [facility];  Service: Cardiovascular;  Laterality: N/A;   INTRAVASCULAR PRESSURE WIRE/FFR STUDY N/A 03/01/2017   Procedure: INTRAVASCULAR PRESSURE WIRE/FFR STUDY;  Surgeon: Burnell Blanks, MD;  Location: [facility];  Service: Cardiovascular;  Laterality: N/A;   LEFT HEART CATH AND CORONARY ANGIOGRAPHY N/A 03/01/2017  Procedure: LEFT HEART CATH AND CORONARY ANGIOGRAPHY;  Surgeon: Burnell Blanks, MD;  Location: [facility];  Service: Cardiovascular;  Laterality: N/A;   PARTIAL HYSTERECTOMY      Social History   Socioeconomic History   Marital status: Married    Spouse name: Adam   Number of children: 3   Years of education: 12   Highest education level: High school graduate  Occupational History    Comment: Retired  Tobacco Use   Smoking status: Former    Packs/day: 0.50    Years: 20.00    Pack years: 10.00    Types: Cigarettes    Quit date: 07/30/2004    Years since quitting:  16.2   Smokeless tobacco: Never  Vaping Use   Vaping Use: Never used  Substance and Sexual Activity   Alcohol use: Yes    Alcohol/week: 0.0 standard drinks    Comment: Occasional   Drug use: No   Sexual activity: Yes    Partners: Male    Birth control/protection: Post-menopausal  Other Topics Concern   Not on file  Social History Narrative   Patient lives in [place] with her husband and niece.   Patient has 3 daughters she is close with. One lives right next door.    Patient enjoys sewing, cooking, reading, and outdoor activities.    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    Social Determinants of Health   Financial Resource Strain: Not on file  Food Insecurity: Not on file  Transportation Needs: Not on file  Physical Activity: Not on file  Stress: Not on file  Social Connections: Not on file  Intimate Partner Violence: Not on file    Family History  Problem Relation Age of Onset   Heart disease Mother        CHF   Diabetes Mother    Hypertension Mother    Cancer Father    Hypertension Sister    Pancreatitis Sister    Dementia Sister    Esophageal cancer Sister    Esophageal cancer Brother    Colon cancer Neg Hx    Stomach cancer Neg Hx    Rectal cancer Neg Hx     ROS: no fevers or chills, productive cough, hemoptysis, dysphasia, odynophagia, melena, hematochezia, dysuria, hematuria, rash, seizure activity, orthopnea, PND, pedal edema, claudication. Remaining systems are negative.  Physical Exam: Well-developed well-nourished in no acute distress.  Skin is warm and dry.  HEENT is normal.  Neck is supple.  Chest is clear to auscultation with normal expansion.  Cardiovascular exam is regular rate and rhythm.  Abdominal exam nontender or distended. No masses palpated. Extremities show no edema. neuro grossly intact  ECG-sinus bradycardia at a rate of 58, no ST changes.  Personally reviewed  A/P  1 coronary artery  disease-patient doing well with no chest pain.  Plan to continue medical therapy with aspirin and statin.  2 hypertension-patient's blood pressure is controlled.  Continue present medications.  3 hyperlipidemia-continue statin.  Check lipids and liver.  4 history of orthostasis-symptoms are controlled at present.  Continue to encourage increase p.o. fluid intake and sodium intake.  5 palpitations-she is describing palpitations of uncertain etiology.  We will arrange an echocardiogram to reassess LV function.  I will also arrange a monitor to further evaluate her palpitations.  [name], MD

## 2020-10-11 ENCOUNTER — Encounter: Payer: Self-pay | Admitting: Cardiology

## 2020-10-11 ENCOUNTER — Other Ambulatory Visit: Payer: Self-pay

## 2020-10-11 ENCOUNTER — Ambulatory Visit: Payer: Medicare Other | Admitting: Cardiology

## 2020-10-11 VITALS — BP 122/54 | HR 58 | Ht 62.0 in | Wt 179.4 lb

## 2020-10-11 DIAGNOSIS — I10 Essential (primary) hypertension: Secondary | ICD-10-CM | POA: Diagnosis not present

## 2020-10-11 DIAGNOSIS — I2510 Atherosclerotic heart disease of native coronary artery without angina pectoris: Secondary | ICD-10-CM

## 2020-10-11 DIAGNOSIS — E7800 Pure hypercholesterolemia, unspecified: Secondary | ICD-10-CM

## 2020-10-11 DIAGNOSIS — R002 Palpitations: Secondary | ICD-10-CM | POA: Diagnosis not present

## 2020-10-11 NOTE — Patient Instructions (Signed)
Lab Work:  Your physician recommends that you return for lab work in: FASTING  If you have labs (blood work) drawn today and your tests are completely normal, you will receive your results only by: [employer] (if you have [place]) OR A paper copy in the mail If you have any lab test that is abnormal or we need to change your treatment, we will call you to review the results.   Testing/Procedures:  Your physician has requested that you have an echocardiogram. Echocardiography is a painless test that uses sound waves to create images of your heart. It provides your doctor with information about the size and shape of your heart and how well your heart's chambers and valves are working. This procedure takes approximately one hour. There are no restrictions for this procedure. [place] Instructions  Your physician has requested you wear a ZIO patch monitor for 7 days.  This is a single patch monitor. [employer] supplies one patch monitor per enrollment. Additional stickers are not available. Please do not apply patch if you will be having a Nuclear Stress Test,  Echocardiogram, Cardiac CT, MRI, or Chest Xray during the period you would be wearing the  monitor. The patch cannot be worn during these tests. You cannot remove and re-apply the  ZIO XT patch monitor.  Your ZIO patch monitor will be mailed 3 day USPS to your address on file. It may take 3-5 days  to receive your monitor after you have been enrolled.  Once you have received your monitor, please review the enclosed instructions. Your monitor  has already been registered assigning a specific monitor serial # to you.  Billing and Patient Assistance Program Information  We have supplied [employer] with any of your insurance information on file for billing purposes. [employer] offers a sliding scale Patient Assistance Program for patients that do not have  insurance, or whose insurance does not  completely cover the cost of the ZIO monitor.  You must apply for the Patient Assistance Program to qualify for this discounted rate.  To apply, please call [employer] at [phone], select option 4, select option 2, ask to apply for  Patient Assistance Program. [name] [name] will ask your household income, and how many people  are in your household. They will quote your out-of-pocket cost based on that information.  [employer] will also be able to set up a 15-month, interest-free payment plan if needed.  Applying the monitor   Shave hair from upper left chest.  Hold abrader disc by orange tab. Rub abrader in 40 strokes over the upper left chest as  indicated in your monitor instructions.  Clean area with 4 enclosed alcohol pads. Let dry.  Apply patch as indicated in monitor instructions. Patch will be placed under collarbone on left  side of chest with arrow pointing upward.  Rub patch adhesive wings for 2 minutes. Remove white label marked "1". Remove the white  label marked "2". Rub patch adhesive wings for 2 additional minutes.  While looking in a mirror, press and release button in center of patch. A small green light will  flash 3-4 times. This will be your only indicator that the monitor has been turned on.  Do not shower for the first 24 hours. You may shower after the first 24 hours.  Press the button if you feel a symptom. You will hear a small click. Record Date, Time and  Symptom in the Patient Logbook.  When  you are ready to remove the patch, follow instructions on the last 2 pages of Patient  Logbook. Stick patch monitor onto the last page of Patient Logbook.  Place Patient Logbook in the blue and white box. Use locking tab on box and tape box closed  securely. The blue and white box has prepaid postage on it. Please place it in the mailbox as  soon as possible. Your physician should have your test results approximately 7 days after the  monitor has been mailed back to [facility].  Call  [place] at [phone] if you have questions regarding  your ZIO XT patch monitor. Call them immediately if you see an orange light blinking on your  monitor.  If your monitor falls off in less than 4 days, contact our Monitor department at [phone].  If your monitor becomes loose or falls off after 4 days call [employer] at [phone] for  suggestions on securing your monitor    Follow-Up: At [facility], you and your health needs are our priority.  As part of our continuing mission to provide you with exceptional heart care, we have created designated Provider Care Teams.  These Care Teams include your primary Cardiologist (physician) and Advanced Practice Providers (APPs -  Physician Assistants and Nurse Practitioners) who all work together to provide you with the care you need, when you need it.  We recommend signing up for the patient portal called "MyChart".  Sign up information is provided on this After Visit Summary.  MyChart is used to connect with patients for Virtual Visits (Telemedicine).  Patients are able to view lab/test results, encounter notes, upcoming appointments, etc.  Non-urgent messages can be sent to your provider as well.   To learn more about what you can do with MyChart, go to [email or web address].    Your next appointment:   3 month(s)  The format for your next appointment:   In Person  Provider:   You will see one of the following Advanced Practice Providers on your designated Care Team:   [name] Rives, PA-C [name] Memos, FNP  Then, [name] Ruths MD will plan to see you again in 6 month(s).

## 2020-10-14 ENCOUNTER — Ambulatory Visit (INDEPENDENT_AMBULATORY_CARE_PROVIDER_SITE_OTHER): Payer: Medicare Other

## 2020-10-14 DIAGNOSIS — R002 Palpitations: Secondary | ICD-10-CM

## 2020-10-14 NOTE — Progress Notes (Unsigned)
Patient enrolled for [employer] to mail a 7 day ZIO XT monitor to address on file.

## 2020-10-21 DIAGNOSIS — R002 Palpitations: Secondary | ICD-10-CM

## 2020-11-01 DIAGNOSIS — R002 Palpitations: Secondary | ICD-10-CM | POA: Diagnosis not present

## 2020-11-02 ENCOUNTER — Other Ambulatory Visit: Payer: Self-pay | Admitting: Cardiology

## 2020-11-07 ENCOUNTER — Ambulatory Visit (HOSPITAL_COMMUNITY): Payer: Medicare Other | Attending: Cardiology

## 2020-11-07 ENCOUNTER — Other Ambulatory Visit: Payer: Self-pay

## 2020-11-07 DIAGNOSIS — I2510 Atherosclerotic heart disease of native coronary artery without angina pectoris: Secondary | ICD-10-CM | POA: Diagnosis not present

## 2020-11-07 DIAGNOSIS — E785 Hyperlipidemia, unspecified: Secondary | ICD-10-CM | POA: Insufficient documentation

## 2020-11-07 DIAGNOSIS — I10 Essential (primary) hypertension: Secondary | ICD-10-CM | POA: Diagnosis not present

## 2020-11-07 DIAGNOSIS — R002 Palpitations: Secondary | ICD-10-CM | POA: Insufficient documentation

## 2020-11-07 DIAGNOSIS — I358 Other nonrheumatic aortic valve disorders: Secondary | ICD-10-CM

## 2020-11-07 DIAGNOSIS — I341 Nonrheumatic mitral (valve) prolapse: Secondary | ICD-10-CM | POA: Insufficient documentation

## 2020-11-07 LAB — ECHOCARDIOGRAM COMPLETE
Area-P 1/2: 2.99 cm2
S' Lateral: 2.7 cm

## 2020-11-08 ENCOUNTER — Encounter: Payer: Self-pay | Admitting: *Deleted

## 2020-11-26 ENCOUNTER — Other Ambulatory Visit: Payer: Self-pay | Admitting: *Deleted

## 2020-11-26 DIAGNOSIS — E7800 Pure hypercholesterolemia, unspecified: Secondary | ICD-10-CM

## 2020-11-26 MED ORDER — EZETIMIBE 10 MG PO TABS: 10.0000 mg | ORAL_TABLET | Freq: Every day | ORAL | 3 refills | Status: DC

## 2020-11-26 MED ORDER — PANTOPRAZOLE SODIUM 40 MG PO TBEC: 40.0000 mg | DELAYED_RELEASE_TABLET | Freq: Two times a day (BID) | ORAL | 3 refills | Status: DC

## 2020-11-26 MED ORDER — AMLODIPINE BESYLATE 5 MG PO TABS: 5.0000 mg | ORAL_TABLET | Freq: Every day | ORAL | 3 refills | Status: DC

## 2020-11-26 MED ORDER — METOPROLOL TARTRATE 25 MG PO TABS: 12.5000 mg | ORAL_TABLET | Freq: Two times a day (BID) | ORAL | 3 refills | Status: DC

## 2020-12-11 ENCOUNTER — Emergency Department (HOSPITAL_COMMUNITY): Payer: Medicare Other

## 2020-12-11 ENCOUNTER — Encounter (HOSPITAL_COMMUNITY): Payer: Self-pay | Admitting: Emergency Medicine

## 2020-12-11 ENCOUNTER — Emergency Department (HOSPITAL_COMMUNITY)
Admission: EM | Admit: 2020-12-11 | Discharge: 2020-12-12 | Discharge status: Against Medical Advice | Payer: Medicare Other | Attending: Emergency Medicine | Admitting: Emergency Medicine

## 2020-12-11 ENCOUNTER — Other Ambulatory Visit: Payer: Self-pay

## 2020-12-11 DIAGNOSIS — Z5321 Procedure and treatment not carried out due to patient leaving prior to being seen by health care provider: Secondary | ICD-10-CM | POA: Diagnosis not present

## 2020-12-11 DIAGNOSIS — M549 Dorsalgia, unspecified: Secondary | ICD-10-CM | POA: Insufficient documentation

## 2020-12-11 DIAGNOSIS — Z20822 Contact with and (suspected) exposure to covid-19: Secondary | ICD-10-CM | POA: Insufficient documentation

## 2020-12-11 DIAGNOSIS — R0789 Other chest pain: Secondary | ICD-10-CM | POA: Insufficient documentation

## 2020-12-11 DIAGNOSIS — R0902 Hypoxemia: Secondary | ICD-10-CM | POA: Diagnosis not present

## 2020-12-11 DIAGNOSIS — R509 Fever, unspecified: Secondary | ICD-10-CM | POA: Insufficient documentation

## 2020-12-11 DIAGNOSIS — J90 Pleural effusion, not elsewhere classified: Secondary | ICD-10-CM | POA: Diagnosis not present

## 2020-12-11 DIAGNOSIS — Z743 Need for continuous supervision: Secondary | ICD-10-CM | POA: Diagnosis not present

## 2020-12-11 DIAGNOSIS — R6889 Other general symptoms and signs: Secondary | ICD-10-CM | POA: Diagnosis not present

## 2020-12-11 DIAGNOSIS — R079 Chest pain, unspecified: Secondary | ICD-10-CM | POA: Diagnosis not present

## 2020-12-11 NOTE — ED Provider Notes (Signed)
Emergency Medicine Provider Triage Evaluation Note  [name] [name] , a 70 y.o. female  was evaluated in triage.  Pt complains of pain under left breast and left back.  Was at gym yesterday and today but only walked on treadmill.  Pain worse with movement.  Some mild nausea but no vomiting.  No SOB, diaphoresis.  Has hx of CAD w/stent, sees Dr. Stanford Breed.  States she is not sure if it is muscle spasm or her heart.  Review of Systems  Positive: Chest pain, nausea Negative: Cough, fever  Physical Exam  BP (!) 159/77 (BP Location: Left Arm)   Pulse 94   Temp (!) 100.9 F (38.3 C) (Oral)   Resp 16   SpO2 94%   Gen:   Awake, no distress   Resp:  Normal effort  MSK:   Moves extremities without difficulty, does appear to have pain when moving left arm Other:    Medical Decision Making  Medically screening exam initiated at 10:45 PM.  Appropriate orders placed.  Verl Blalock was informed that the remainder of the evaluation will be completed by another provider, this initial triage assessment does not replace that evaluation, and the importance of remaining in the ED until their evaluation is complete.  Patient feels pain may be due to muscle spasm.  As she has history of CAD w/stent.  Will proceed with cardiac work-up.  Patient also noted to have temp of 100.78F in triage.  Will obtain covid swab as well.   Larene Pickett, PA-C 12/12/20 [id number]    Ripley Fraise, MD 12/12/20 [id number]

## 2020-12-11 NOTE — ED Triage Notes (Signed)
Per EMS, pt c/o pain under her left chest and in her left back.  No cough,sob,vomiting or fever.  Pain increases w/ movement.  168/68 HR82 94% RA  Pt does have a fever in triage 100.4

## 2020-12-12 ENCOUNTER — Telehealth: Payer: Self-pay | Admitting: Cardiology

## 2020-12-12 LAB — BASIC METABOLIC PANEL
Anion gap: 12 (ref 5–15)
BUN: 11 mg/dL (ref 8–23)
CO2: 21 mmol/L — ABNORMAL LOW (ref 22–32)
Calcium: 9.3 mg/dL (ref 8.9–10.3)
Chloride: 102 mmol/L (ref 98–111)
Creatinine, Ser: 0.92 mg/dL (ref 0.44–1.00)
GFR, Estimated: >60 mL/min (ref >60)
Glucose, Bld: 121 mg/dL — ABNORMAL HIGH (ref 70–99)
Potassium: 4 mmol/L (ref 3.5–5.1)
Sodium: 135 mmol/L (ref 135–145)

## 2020-12-12 LAB — CBC WITH DIFFERENTIAL/PLATELET
Abs Immature Granulocytes: 0.05 10*3/uL (ref 0.00–0.07)
Basophils Absolute: 0.1 10*3/uL (ref 0.0–0.1)
Basophils Relative: 1 %
Eosinophils Absolute: 0.1 10*3/uL (ref 0.0–0.5)
Eosinophils Relative: 1 %
HCT: 41.5 % (ref 36.0–46.0)
Hemoglobin: 13.1 g/dL (ref 12.0–15.0)
Immature Granulocytes: 0 %
Lymphocytes Relative: 18 %
Lymphs Abs: 2.3 10*3/uL (ref 0.7–4.0)
MCH: 22.5 pg — ABNORMAL LOW (ref 26.0–34.0)
MCHC: 31.6 g/dL (ref 30.0–36.0)
MCV: 71.4 fL — ABNORMAL LOW (ref 80.0–100.0)
Monocytes Absolute: 0.7 10*3/uL (ref 0.1–1.0)
Monocytes Relative: 6 %
Neutro Abs: 9.7 10*3/uL — ABNORMAL HIGH (ref 1.7–7.7)
Neutrophils Relative %: 74 %
Platelets: 355 10*3/uL (ref 150–400)
RBC: 5.81 MIL/uL — ABNORMAL HIGH (ref 3.87–5.11)
RDW: 15.7 % — ABNORMAL HIGH (ref 11.5–15.5)
WBC: 12.9 10*3/uL — ABNORMAL HIGH (ref 4.0–10.5)
nRBC: 0 % (ref 0.0–0.2)

## 2020-12-12 LAB — RESP PANEL BY RT-PCR (FLU A&B, COVID) ARPGX2
Influenza A by PCR: NEGATIVE
Influenza B by PCR: NEGATIVE
SARS Coronavirus 2 by RT PCR: NEGATIVE

## 2020-12-12 LAB — TROPONIN I (HIGH SENSITIVITY): Troponin I (High Sensitivity): 3 ng/L (ref <18)

## 2020-12-12 NOTE — Telephone Encounter (Signed)
Spoke with pt and was seen in the ED yesterday for CP reviewed labs Troponin normal other labs stable with the exception of elevated  WBC CXR reviewed shows small pleural effusion and possible lung infection to left lung Pt was needing muscle relaxer Encouraged pt to contact PCP for that med Per pt PCP schedule is full the rest of this week and next Instructed to call an urgent care to be seen Pt feels the CP is muscular Pt will most likely contact an urgent care Will forward to Dr Stanford Breed for review and recommendations ./cy

## 2020-12-12 NOTE — Telephone Encounter (Signed)
Pt c/o of Chest Pain: STAT if CP now or developed within 24 hours  1. Are you having CP right now? yes  2. Are you experiencing any other symptoms (ex. SOB, nausea, vomiting, sweating)? Headache, sweating  3. How long have you been experiencing CP? 3days   4. Is your CP continuous or coming and going? Comes and goes  5. Have you taken Nitroglycerin? yes ?

## 2020-12-12 NOTE — ED Notes (Signed)
Patient son states he is taking her home

## 2020-12-13 ENCOUNTER — Other Ambulatory Visit: Payer: Self-pay

## 2020-12-13 ENCOUNTER — Ambulatory Visit (HOSPITAL_COMMUNITY)
Admission: EM | Admit: 2020-12-13 | Discharge: 2020-12-13 | Disposition: A | Discharge status: Home / Self Care | Payer: Medicare Other

## 2020-12-13 ENCOUNTER — Other Ambulatory Visit: Payer: Self-pay | Admitting: *Deleted

## 2020-12-13 ENCOUNTER — Encounter (HOSPITAL_COMMUNITY): Payer: Self-pay

## 2020-12-13 DIAGNOSIS — R519 Headache, unspecified: Secondary | ICD-10-CM | POA: Diagnosis not present

## 2020-12-13 DIAGNOSIS — R61 Generalized hyperhidrosis: Secondary | ICD-10-CM

## 2020-12-13 NOTE — Telephone Encounter (Signed)
Patient is requesting a refill of medications selected below.  She is scheduled for a hospital/UC follow up on 12-18-20 with Dr. Susa Simmonds.  Uzair Godley,CMA

## 2020-12-13 NOTE — ED Triage Notes (Signed)
Pt presents with headaches, body aches and SOB X 4 days.States she took a COVID test that resulted negative. Pt states she was told she had fluid in her left lung. Pt states she is concerned more about headaches and body aches.

## 2020-12-13 NOTE — Discharge Instructions (Signed)
You can take Tylenol as needed for fever reduction and pain relief.   For cough: honey 1/2 to 1 teaspoon (you can dilute the honey in water or another fluid).  You can also use guaifenesin and dextromethorphan for cough. You can use a humidifier for chest congestion and cough.  If you don't have a humidifier, you can sit in the bathroom with the hot shower running.     For sore throat: try warm salt water gargles, cepacol lozenges, throat spray, warm tea or water with lemon/honey, popsicles or ice, or OTC cold relief medicine for throat discomfort.    For congestion: take a daily anti-histamine like Zyrtec, Claritin, and a oral decongestant, such as pseudoephedrine.  You can also use Flonase 1-2 sprays in each nostril daily.    It is important to stay hydrated: drink plenty of fluids (water, gatorade/powerade/pedialyte, juices, or teas) to keep your throat moisturized and help further relieve irritation/discomfort.   Return or go to the Emergency Department if symptoms worsen or do not improve in the next few days.

## 2020-12-13 NOTE — ED Provider Notes (Signed)
[name] [name]    CSN: [id number] Arrival date & time: 12/13/20  1239      History   Chief Complaint Chief Complaint  Patient presents with   Headache   Generalized Body Aches    HPI [name] [name] is a 70 y.o. female.   Patient here for evaluation of headaches, body aches, and sweating that has been ongoing for the past 4 days.  Patient was in the emergency room on Wednesday for evaluation but did not stay to be seen by a provider due to the weight.  Patient did have labs drawn that showed WBC of 12.9, x-ray did not show any active cardiopulmonary disease but did have left midlung patchy opacities and some small pleural effusions that will require a repeat chest x-ray in 4 to 6 weeks.  Reports taking Tylenol with minimal symptom relief.  Reports headache is worse when she is up and moving around.  COVID test was negative.  Denies any trauma, injury, or other precipitating event.  Denies any chest pain, shortness of breath, N/V/D, numbness, tingling, weakness, abdominal pain.    The history is provided by the patient.  Headache Associated symptoms: fatigue, fever and myalgias    Past Medical History:  Diagnosis Date   Arthritis    Blood transfusion without reported diagnosis    Cataract    Coronary artery disease    Depression    Hyperlipidemia    Hypertension    Microcytic anemia    Mitral valve prolapse     Patient Active Problem List   Diagnosis Date Noted   Normal foot exam 05/18/2020   Encounter for diabetic foot exam ([place]) 05/18/2020   Healthcare maintenance 04/23/2020   Stress incontinence 02/12/2020   Left hip pain 07/20/2019   Plantar fasciitis of right foot 02/13/2019   CAD (coronary artery disease) 02/09/2019   Plantar fasciitis of left foot 10/24/2018   Neuropathy due to medical condition ([place]) 01/31/2018   Difficulty sleeping 01/31/2018   Unstable angina (H[facility])    Heel pain, bilateral 09/07/2016   Orthostatic hypotension 09/07/2016   Right  hip pain 08/19/2016   Lumbar strain 04/27/2016   Left knee pain 04/27/2016   Finger pain, right 04/27/2016   Neck pain 11/23/2015   Low back pain 07/11/2015   Right shoulder pain 04/25/2015   GERD (gastroesophageal reflux disease) 01/28/2015   Microcytic anemia 01/28/2015   Near syncope 11/09/2014   Leg cramps 11/15/2013   Vitamin D deficiency 11/15/2013   Prediabetes 03/01/2012   Chest pain 08/18/2011   Anxiety and depression 05/31/2009   Mitral valve disorder 04/15/2009   CARPAL TUNNEL SYNDROME 01/26/2008   OBESITY 01/04/2008   HYPERCHOLESTEROLEMIA 06/24/2006   Essential hypertension 06/24/2006    Past Surgical History:  Procedure Laterality Date   ABDOMINAL HYSTERECTOMY     CARDIAC CATHETERIZATION     CATARACT EXTRACTION, BILATERAL     COLONOSCOPY     greater than 10 years ago   CORONARY STENT INTERVENTION N/A 03/01/2017   Procedure: CORONARY STENT INTERVENTION;  Surgeon: Burnell Blanks, MD;  Location: [place] [facility];  Service: Cardiovascular;  Laterality: N/A;   INTRAVASCULAR PRESSURE WIRE/FFR STUDY N/A 03/01/2017   Procedure: INTRAVASCULAR PRESSURE WIRE/FFR STUDY;  Surgeon: Burnell Blanks, MD;  Location: [place] [facility];  Service: Cardiovascular;  Laterality: N/A;   LEFT HEART CATH AND CORONARY ANGIOGRAPHY N/A 03/01/2017   Procedure: LEFT HEART CATH AND CORONARY ANGIOGRAPHY;  Surgeon: Burnell Blanks, MD;  Location: [place]  [facility];  Service: Cardiovascular;  Laterality: N/A;   PARTIAL HYSTERECTOMY      OB History   No obstetric history on file.      Home Medications    Prior to Admission medications   Medication Sig Start Date End Date Taking? Authorizing Provider  amLODipine (NORVASC) 5 MG tablet Take 1 tablet (5 mg total) by mouth daily. 11/26/20   Lelon Perla, MD  amoxicillin (AMOXIL) 500 MG capsule Take 500 mg by mouth 4 (four) times daily. 09/24/20   [provider]  aspirin 81 MG tablet Take 81 mg by mouth  daily.    [provider]  atorvastatin (LIPITOR) 80 MG tablet TAKE 1 TABLET BY MOUTH EVERY DAY 03/09/20   Carollee Leitz, MD  conjugated estrogens (PREMARIN) vaginal cream Apply vaginally daily x 2 weeks then twice weekly thereafter 02/12/20   Mullis, Kiersten P, DO  cyclobenzaprine (FLEXERIL) 10 MG tablet TAKE 1 TABLET (10 MG TOTAL) BY MOUTH DAILY AS NEEDED FOR MUSCLE SPASMS. 10/06/18   Kathrene Alu, MD  ezetimibe (ZETIA) 10 MG tablet Take 1 tablet (10 mg total) by mouth daily. 11/26/20 02/24/21  Lelon Perla, MD  ferrous sulfate 325 (65 FE) MG tablet TAKE 1 TABLET BY MOUTH EVERY DAY WITH BREAKFAST 02/29/20   Carollee Leitz, MD  fluticasone [facility]) 50 MCG/ACT nasal spray Place 1 spray daily into both nostrils.    [provider]  gabapentin (NEURONTIN) 100 MG capsule Take 2 capsules (200 mg total) by mouth at bedtime. 03/12/20   Carollee Leitz, MD  metoprolol tartrate (LOPRESSOR) 25 MG tablet Take 0.5 tablets (12.5 mg total) by mouth 2 (two) times daily. 11/26/20   Lelon Perla, MD  nitroGLYCERIN (NITROSTAT) 0.4 MG SL tablet Place 1 tablet (0.4 mg total) under the tongue every 5 (five) minutes as needed for chest pain. 04/23/20 07/22/20  Carollee Leitz, MD  pantoprazole (PROTONIX) 40 MG tablet Take 1 tablet (40 mg total) by mouth 2 (two) times daily. 11/26/20   Lelon Perla, MD  prednisoLONE acetate (PRED FORTE) 1 % ophthalmic suspension 1 drop 3 (three) times daily. Patient not taking: Reported on 10/11/2020 06/12/20   [provider]  sertraline (ZOLOFT) 100 MG tablet TAKE 1 TABLET BY MOUTH EVERY DAY 06/26/20   Lyndee Hensen, DO    Family History Family History  Problem Relation Age of Onset   Heart disease Mother        CHF   Diabetes Mother    Hypertension Mother    Cancer Father    Hypertension Sister    Pancreatitis Sister    Dementia Sister    Esophageal cancer Sister    Esophageal cancer Brother    Colon cancer Neg Hx    Stomach cancer Neg Hx     Rectal cancer Neg Hx     Social History Social History   Tobacco Use   Smoking status: Former    Packs/day: 0.50    Years: 20.00    Pack years: 10.00    Types: Cigarettes    Quit date: 07/30/2004    Years since quitting: 16.3   Smokeless tobacco: Never  Vaping Use   Vaping Use: Never used  Substance Use Topics   Alcohol use: Yes    Alcohol/week: 0.0 standard drinks    Comment: Occasional   Drug use: No     Allergies   Patient has no known allergies.   Review of Systems Review of Systems  Constitutional:  Positive  for diaphoresis, fatigue and fever.  Musculoskeletal:  Positive for myalgias.  Neurological:  Positive for headaches.  All other systems reviewed and are negative.   Physical Exam Triage Vital Signs ED Triage Vitals  Enc Vitals Group     BP 12/13/20 1345 (!) 146/77     Pulse Rate 12/13/20 1345 63     Resp 12/13/20 1345 18     Temp 12/13/20 1345 98.4 F (36.9 C)     Temp Source 12/13/20 1345 Oral     SpO2 12/13/20 1345 96 %     Weight --      Height --      Head Circumference --      Peak Flow --      Pain Score 12/13/20 1344 5     Pain Loc --      Pain Edu? --      Excl. in [place]? --    No data found.  Updated Vital Signs BP (!) 146/77 (BP Location: Left Arm)   Pulse 63   Temp 98.4 F (36.9 C) (Oral)   Resp 18   SpO2 96%   Visual Acuity Right Eye Distance:   Left Eye Distance:   Bilateral Distance:    Right Eye Near:   Left Eye Near:    Bilateral Near:     Physical Exam Vitals and nursing note reviewed.  Constitutional:      General: She is not in acute distress.    Appearance: Normal appearance. She is not ill-appearing, toxic-appearing or diaphoretic.  HENT:     Head: Normocephalic and atraumatic.     Mouth/Throat:     Mouth: Mucous membranes are moist.  Eyes:     Extraocular Movements: Extraocular movements intact.     Conjunctiva/sclera: Conjunctivae normal.     Pupils: Pupils are equal, round, and reactive to light.   Cardiovascular:     Rate and Rhythm: Normal rate and regular rhythm.     Pulses: Normal pulses.     Heart sounds: Normal heart sounds.  Pulmonary:     Effort: Pulmonary effort is normal.     Breath sounds: Normal breath sounds. No stridor. No rhonchi or rales.  Abdominal:     General: Abdomen is flat.  Musculoskeletal:        General: Normal range of motion.     Cervical back: Normal range of motion.  Skin:    General: Skin is warm and dry.  Neurological:     General: No focal deficit present.     Mental Status: She is alert and oriented to person, place, and time.     GCS: GCS eye subscore is 4. GCS verbal subscore is 5. GCS motor subscore is 6.     Cranial Nerves: No dysarthria or facial asymmetry.     Motor: No weakness.     Coordination: Romberg sign negative. Coordination normal.     Gait: Gait normal.  Psychiatric:        Mood and Affect: Mood normal.     UC Treatments / Results  Labs (all labs ordered are listed, but only abnormal results are displayed) Labs Reviewed - No data to display  EKG   Radiology DG Chest 2 View  Result Date: 12/11/2020 CLINICAL DATA:  Chest pain. EXAM: CHEST - 2 VIEW COMPARISON:  Chest x-ray 12/09/2010. FINDINGS: There is some faint patchy airspace opacities in the left mid lung. There are small bilateral pleural effusions. There is no pneumothorax. The cardiomediastinal silhouette is  within normal limits. No acute fractures are seen. IMPRESSION: 1. Minimal patchy airspace opacities in the left mid lung worrisome for infection. 2. Small pleural effusions. 3. Follow-up chest x-ray recommended in 4-6 weeks to confirm resolution. Electronically Signed   By: Ronney Asters M.D.   On: 12/11/2020 23:50    Procedures Procedures (including critical care time)  Medications Ordered in UC Medications - No data to display  Initial Impression / Assessment and Plan / UC Course  I have reviewed the triage vital signs and the nursing  notes.  Pertinent labs & imaging results that were available during my care of the patient were reviewed by me and considered in my medical decision making (see chart for details).    Assessment negative for red flags or concerns.  Headache and diaphoresis.  Likely a viral illness but headache possibly tension as it worsens with movement.  Discussed conservative symptom management including Tylenol as needed.  Encourage fluids and rest.  Recommend following up with primary care soon as possible for reevaluation.  Strict ED follow-up for any red flag symptoms. Final Clinical Impressions(s) / UC Diagnoses   Final diagnoses:  Nonintractable headache, unspecified chronicity pattern, unspecified headache type  Diaphoresis     Discharge Instructions      You can take Tylenol as needed for fever reduction and pain relief.   For cough: honey 1/2 to 1 teaspoon (you can dilute the honey in water or another fluid).  You can also use guaifenesin and dextromethorphan for cough. You can use a humidifier for chest congestion and cough.  If you don't have a humidifier, you can sit in the bathroom with the hot shower running.     For sore throat: try warm salt water gargles, cepacol lozenges, throat spray, warm tea or water with lemon/honey, popsicles or ice, or OTC cold relief medicine for throat discomfort.    For congestion: take a daily anti-histamine like Zyrtec, Claritin, and a oral decongestant, such as pseudoephedrine.  You can also use Flonase 1-2 sprays in each nostril daily.    It is important to stay hydrated: drink plenty of fluids (water, gatorade/powerade/pedialyte, juices, or teas) to keep your throat moisturized and help further relieve irritation/discomfort.   Return or go to the Emergency Department if symptoms worsen or do not improve in the next few days.      ED Prescriptions   None    PDMP not reviewed this encounter.   Pearson Forster, NP 12/13/20 1433

## 2020-12-14 ENCOUNTER — Telehealth: Payer: Self-pay | Admitting: Family Medicine

## 2020-12-14 MED ORDER — FERROUS SULFATE 325 (65 FE) MG PO TABS: ORAL_TABLET | ORAL | 1 refills | Status: DC

## 2020-12-14 NOTE — Telephone Encounter (Signed)
After hours/ emergency line  Recently had an urgent care visit a few days ago for chest pain with complete workup and deemed to be less likely of cardiac etiology and more likely musculoskeletal. But calls regarding a headache. Describes as sharp, stabbing pain that is all over her head, both frontal and occipital. Pain has worsened gradually and she has had this headache since a few days ago. Endorses vision changes initially that only started with positional changes but have resolved the past few days. Headache does not wake her up in the night but can make it hard to fall asleep. Recommended to patient that if she gets a sudden headache or has vision changes again to return to the ED. Other return precautions given. Recommended to take tylenol and apply ice, avoid NSAIDs given STENT placement.

## 2020-12-15 ENCOUNTER — Emergency Department (HOSPITAL_COMMUNITY): Payer: Medicare Other

## 2020-12-15 ENCOUNTER — Other Ambulatory Visit: Payer: Self-pay

## 2020-12-15 ENCOUNTER — Encounter (HOSPITAL_COMMUNITY): Payer: Self-pay

## 2020-12-15 ENCOUNTER — Emergency Department (HOSPITAL_COMMUNITY)
Admission: EM | Admit: 2020-12-15 | Discharge: 2020-12-15 | Disposition: A | Discharge status: Home / Self Care | Payer: Medicare Other | Attending: Emergency Medicine | Admitting: Emergency Medicine

## 2020-12-15 DIAGNOSIS — I2510 Atherosclerotic heart disease of native coronary artery without angina pectoris: Secondary | ICD-10-CM | POA: Insufficient documentation

## 2020-12-15 DIAGNOSIS — Z7982 Long term (current) use of aspirin: Secondary | ICD-10-CM | POA: Insufficient documentation

## 2020-12-15 DIAGNOSIS — J189 Pneumonia, unspecified organism: Secondary | ICD-10-CM

## 2020-12-15 DIAGNOSIS — R0602 Shortness of breath: Secondary | ICD-10-CM | POA: Diagnosis not present

## 2020-12-15 DIAGNOSIS — Z20822 Contact with and (suspected) exposure to covid-19: Secondary | ICD-10-CM | POA: Diagnosis not present

## 2020-12-15 DIAGNOSIS — R519 Headache, unspecified: Secondary | ICD-10-CM | POA: Insufficient documentation

## 2020-12-15 DIAGNOSIS — J181 Lobar pneumonia, unspecified organism: Secondary | ICD-10-CM | POA: Diagnosis not present

## 2020-12-15 DIAGNOSIS — R059 Cough, unspecified: Secondary | ICD-10-CM | POA: Diagnosis present

## 2020-12-15 DIAGNOSIS — Z79899 Other long term (current) drug therapy: Secondary | ICD-10-CM | POA: Insufficient documentation

## 2020-12-15 DIAGNOSIS — Z87891 Personal history of nicotine dependence: Secondary | ICD-10-CM | POA: Insufficient documentation

## 2020-12-15 DIAGNOSIS — I10 Essential (primary) hypertension: Secondary | ICD-10-CM | POA: Insufficient documentation

## 2020-12-15 LAB — CBC WITH DIFFERENTIAL/PLATELET
Abs Immature Granulocytes: 0.08 10*3/uL — ABNORMAL HIGH (ref 0.00–0.07)
Basophils Absolute: 0.1 10*3/uL (ref 0.0–0.1)
Basophils Relative: 0 %
Eosinophils Absolute: 0.1 10*3/uL (ref 0.0–0.5)
Eosinophils Relative: 0 %
HCT: 38 % (ref 36.0–46.0)
Hemoglobin: 12.1 g/dL (ref 12.0–15.0)
Immature Granulocytes: 1 %
Lymphocytes Relative: 16 %
Lymphs Abs: 2.2 10*3/uL (ref 0.7–4.0)
MCH: 22.5 pg — ABNORMAL LOW (ref 26.0–34.0)
MCHC: 31.8 g/dL (ref 30.0–36.0)
MCV: 70.8 fL — ABNORMAL LOW (ref 80.0–100.0)
Monocytes Absolute: 1 10*3/uL (ref 0.1–1.0)
Monocytes Relative: 7 %
Neutro Abs: 10.6 10*3/uL — ABNORMAL HIGH (ref 1.7–7.7)
Neutrophils Relative %: 76 %
Platelets: 413 10*3/uL — ABNORMAL HIGH (ref 150–400)
RBC: 5.37 MIL/uL — ABNORMAL HIGH (ref 3.87–5.11)
RDW: 15.3 % (ref 11.5–15.5)
WBC: 13.9 10*3/uL — ABNORMAL HIGH (ref 4.0–10.5)
nRBC: 0 % (ref 0.0–0.2)

## 2020-12-15 LAB — COMPREHENSIVE METABOLIC PANEL
ALT: 67 U/L — ABNORMAL HIGH (ref 0–44)
AST: 38 U/L (ref 15–41)
Albumin: 3.6 g/dL (ref 3.5–5.0)
Alkaline Phosphatase: 77 U/L (ref 38–126)
Anion gap: 11 (ref 5–15)
BUN: 10 mg/dL (ref 8–23)
CO2: 22 mmol/L (ref 22–32)
Calcium: 9.1 mg/dL (ref 8.9–10.3)
Chloride: 103 mmol/L (ref 98–111)
Creatinine, Ser: 0.78 mg/dL (ref 0.44–1.00)
GFR, Estimated: >60 mL/min (ref >60)
Glucose, Bld: 117 mg/dL — ABNORMAL HIGH (ref 70–99)
Potassium: 4.1 mmol/L (ref 3.5–5.1)
Sodium: 136 mmol/L (ref 135–145)
Total Bilirubin: 0.8 mg/dL (ref 0.3–1.2)
Total Protein: 8.2 g/dL — ABNORMAL HIGH (ref 6.5–8.1)

## 2020-12-15 LAB — RESP PANEL BY RT-PCR (FLU A&B, COVID) ARPGX2
Influenza A by PCR: NEGATIVE
Influenza B by PCR: NEGATIVE
SARS Coronavirus 2 by RT PCR: NEGATIVE

## 2020-12-15 LAB — TROPONIN I (HIGH SENSITIVITY)
Troponin I (High Sensitivity): 4 ng/L (ref <18)
Troponin I (High Sensitivity): 4 ng/L (ref <18)

## 2020-12-15 MED ORDER — KETOROLAC TROMETHAMINE 30 MG/ML IJ SOLN
30.0000 mg | Freq: Once | INTRAMUSCULAR | Status: AC
  Administered 2020-12-15: 30 mg via INTRAVENOUS
  Filled 2020-12-15: qty 1

## 2020-12-15 MED ORDER — DOXYCYCLINE HYCLATE 100 MG PO TABS
100.0000 mg | ORAL_TABLET | Freq: Once | ORAL | Status: AC
  Administered 2020-12-15: 100 mg via ORAL
  Filled 2020-12-15: qty 1

## 2020-12-15 MED ORDER — SODIUM CHLORIDE 0.9 % IV BOLUS
1000.0000 mL | Freq: Once | INTRAVENOUS | Status: AC
  Administered 2020-12-15: 1000 mL via INTRAVENOUS

## 2020-12-15 MED ORDER — DOXYCYCLINE HYCLATE 100 MG PO CAPS: 100.0000 mg | ORAL_CAPSULE | Freq: Two times a day (BID) | ORAL | 0 refills | Status: DC

## 2020-12-15 MED ORDER — SODIUM CHLORIDE 0.9 % IV SOLN
2.0000 g | Freq: Once | INTRAVENOUS | Status: AC
  Administered 2020-12-15: 2 g via INTRAVENOUS
  Filled 2020-12-15: qty 20

## 2020-12-15 MED ORDER — ACETAMINOPHEN 325 MG PO TABS
650.0000 mg | ORAL_TABLET | Freq: Once | ORAL | Status: AC
  Administered 2020-12-15: 650 mg via ORAL
  Filled 2020-12-15: qty 2

## 2020-12-15 NOTE — ED Provider Notes (Signed)
[facility] DEPT Provider Note   CSN: [id number] Arrival date & time: 12/15/20  1330     History Chief Complaint  Patient presents with   Headache   Cough    [name] [name] is a 70 y.o. female.  Patient complains of fever aches cough for few days.  The history is provided by the patient and medical records. No language interpreter was used.  Cough Cough characteristics:  Productive Sputum characteristics:  Nondescript Severity:  Moderate Onset quality:  Sudden Timing:  Constant Progression:  Worsening Chronicity:  New Smoker: no   Context: not animal exposure   Relieved by:  Nothing Associated symptoms: no chest pain, no eye discharge, no headaches and no rash       Past Medical History:  Diagnosis Date   Arthritis    Blood transfusion without reported diagnosis    Cataract    Coronary artery disease    Depression    Hyperlipidemia    Hypertension    Microcytic anemia    Mitral valve prolapse     Patient Active Problem List   Diagnosis Date Noted   Normal foot exam 05/18/2020   Encounter for diabetic foot exam ([place]) 05/18/2020   Healthcare maintenance 04/23/2020   Stress incontinence 02/12/2020   Left hip pain 07/20/2019   Plantar fasciitis of right foot 02/13/2019   CAD (coronary artery disease) 02/09/2019   Plantar fasciitis of left foot 10/24/2018   Neuropathy due to medical condition ([place]) 01/31/2018   Difficulty sleeping 01/31/2018   Unstable angina (H[facility])    Heel pain, bilateral 09/07/2016   Orthostatic hypotension 09/07/2016   Right hip pain 08/19/2016   Lumbar strain 04/27/2016   Left knee pain 04/27/2016   Finger pain, right 04/27/2016   Neck pain 11/23/2015   Low back pain 07/11/2015   Right shoulder pain 04/25/2015   GERD (gastroesophageal reflux disease) 01/28/2015   Microcytic anemia 01/28/2015   Near syncope 11/09/2014   Leg cramps 11/15/2013   Vitamin D deficiency 11/15/2013   Prediabetes  03/01/2012   Chest pain 08/18/2011   Anxiety and depression 05/31/2009   Mitral valve disorder 04/15/2009   CARPAL TUNNEL SYNDROME 01/26/2008   OBESITY 01/04/2008   HYPERCHOLESTEROLEMIA 06/24/2006   Essential hypertension 06/24/2006    Past Surgical History:  Procedure Laterality Date   ABDOMINAL HYSTERECTOMY     CARDIAC CATHETERIZATION     CATARACT EXTRACTION, BILATERAL     COLONOSCOPY     greater than 10 years ago   CORONARY STENT INTERVENTION N/A 03/01/2017   Procedure: CORONARY STENT INTERVENTION;  Surgeon: Burnell Blanks, MD;  Location: [facility];  Service: Cardiovascular;  Laterality: N/A;   INTRAVASCULAR PRESSURE WIRE/FFR STUDY N/A 03/01/2017   Procedure: INTRAVASCULAR PRESSURE WIRE/FFR STUDY;  Surgeon: Burnell Blanks, MD;  Location: [facility];  Service: Cardiovascular;  Laterality: N/A;   LEFT HEART CATH AND CORONARY ANGIOGRAPHY N/A 03/01/2017   Procedure: LEFT HEART CATH AND CORONARY ANGIOGRAPHY;  Surgeon: Burnell Blanks, MD;  Location: [facility];  Service: Cardiovascular;  Laterality: N/A;   PARTIAL HYSTERECTOMY       OB History   No obstetric history on file.     Family History  Problem Relation Age of Onset   Heart disease Mother        CHF   Diabetes Mother    Hypertension Mother    Cancer Father    Hypertension Sister    Pancreatitis Sister  Dementia Sister    Esophageal cancer Sister    Esophageal cancer Brother    Colon cancer Neg Hx    Stomach cancer Neg Hx    Rectal cancer Neg Hx     Social History   Tobacco Use   Smoking status: Former    Packs/day: 0.50    Years: 20.00    Pack years: 10.00    Types: Cigarettes    Quit date: 07/30/2004    Years since quitting: 16.3   Smokeless tobacco: Never  Vaping Use   Vaping Use: Never used  Substance Use Topics   Alcohol use: Yes    Alcohol/week: 0.0 standard drinks    Comment: Occasional   Drug use: No    Home Medications Prior to Admission  medications   Medication Sig Start Date End Date Taking? Authorizing Provider  doxycycline (VIBRAMYCIN) 100 MG capsule Take 1 capsule (100 mg total) by mouth 2 (two) times daily. One po bid x 7 days 12/15/20  Yes Milton Ferguson, MD  amLODipine (NORVASC) 5 MG tablet Take 1 tablet (5 mg total) by mouth daily. 11/26/20   Lelon Perla, MD  amoxicillin (AMOXIL) 500 MG capsule Take 500 mg by mouth 4 (four) times daily. 09/24/20   [provider]  aspirin 81 MG tablet Take 81 mg by mouth daily.    [provider]  atorvastatin (LIPITOR) 80 MG tablet TAKE 1 TABLET BY MOUTH EVERY DAY 03/09/20   Carollee Leitz, MD  conjugated estrogens (PREMARIN) vaginal cream Apply vaginally daily x 2 weeks then twice weekly thereafter 02/12/20   Mullis, Kiersten P, DO  cyclobenzaprine (FLEXERIL) 10 MG tablet TAKE 1 TABLET (10 MG TOTAL) BY MOUTH DAILY AS NEEDED FOR MUSCLE SPASMS. 10/06/18   Kathrene Alu, MD  ezetimibe (ZETIA) 10 MG tablet Take 1 tablet (10 mg total) by mouth daily. 11/26/20 02/24/21  Lelon Perla, MD  ferrous sulfate 325 (65 FE) MG tablet TAKE 1 TABLET BY MOUTH EVERY DAY WITH BREAKFAST 12/14/20   Carollee Leitz, MD  fluticasone [facility]) 50 MCG/ACT nasal spray Place 1 spray daily into both nostrils.    [provider]  gabapentin (NEURONTIN) 100 MG capsule Take 2 capsules (200 mg total) by mouth at bedtime. 03/12/20   Carollee Leitz, MD  metoprolol tartrate (LOPRESSOR) 25 MG tablet Take 0.5 tablets (12.5 mg total) by mouth 2 (two) times daily. 11/26/20   Lelon Perla, MD  nitroGLYCERIN (NITROSTAT) 0.4 MG SL tablet Place 1 tablet (0.4 mg total) under the tongue every 5 (five) minutes as needed for chest pain. 04/23/20 07/22/20  Carollee Leitz, MD  pantoprazole (PROTONIX) 40 MG tablet Take 1 tablet (40 mg total) by mouth 2 (two) times daily. 11/26/20   Lelon Perla, MD  prednisoLONE acetate (PRED FORTE) 1 % ophthalmic suspension 1 drop 3 (three) times daily. Patient not taking:  Reported on 10/11/2020 06/12/20   [provider]  sertraline (ZOLOFT) 100 MG tablet TAKE 1 TABLET BY MOUTH EVERY DAY 06/26/20   Lyndee Hensen, DO    Allergies    Patient has no known allergies.  Review of Systems   Review of Systems  Constitutional:  Negative for appetite change and fatigue.  HENT:  Negative for congestion, ear discharge and sinus pressure.   Eyes:  Negative for discharge.  Respiratory:  Positive for cough.   Cardiovascular:  Negative for chest pain.  Gastrointestinal:  Negative for abdominal pain and diarrhea.  Genitourinary:  Negative for frequency and hematuria.  Musculoskeletal:  Negative for back pain.  Skin:  Negative for rash.  Neurological:  Negative for seizures and headaches.  Psychiatric/Behavioral:  Negative for hallucinations.    Physical Exam Updated Vital Signs BP (!) 151/84   Pulse 73   Temp 100.2 F (37.9 C) (Oral)   Resp 17   SpO2 98%   Physical Exam Vitals and nursing note reviewed.  Constitutional:      Appearance: She is well-developed.  HENT:     Head: Normocephalic.     Nose: Nose normal.  Eyes:     General: No scleral icterus.    Conjunctiva/sclera: Conjunctivae normal.  Neck:     Thyroid: No thyromegaly.  Cardiovascular:     Rate and Rhythm: Normal rate and regular rhythm.     Heart sounds: No murmur heard.   No friction rub. No gallop.  Pulmonary:     Breath sounds: No stridor. No wheezing or rales.  Chest:     Chest wall: No tenderness.  Abdominal:     General: There is no distension.     Tenderness: There is no abdominal tenderness. There is no rebound.  Musculoskeletal:        General: Normal range of motion.     Cervical back: Neck supple.  Lymphadenopathy:     Cervical: No cervical adenopathy.  Skin:    Findings: No erythema or rash.  Neurological:     Mental Status: She is alert and oriented to person, place, and time.     Motor: No abnormal muscle tone.     Coordination: Coordination normal.   Psychiatric:        Behavior: Behavior normal.    ED Results / Procedures / Treatments   Labs (all labs ordered are listed, but only abnormal results are displayed) Labs Reviewed  COMPREHENSIVE METABOLIC PANEL - Abnormal; Notable for the following components:      Result Value   Glucose, Bld 117 (*)    Total Protein 8.2 (*)    ALT 67 (*)    All other components within normal limits  CBC WITH DIFFERENTIAL/PLATELET - Abnormal; Notable for the following components:   WBC 13.9 (*)    RBC 5.37 (*)    MCV 70.8 (*)    MCH 22.5 (*)    Platelets 413 (*)    Neutro Abs 10.6 (*)    Abs Immature Granulocytes 0.08 (*)    All other components within normal limits  RESP PANEL BY RT-PCR (FLU A&B, COVID) ARPGX2  TROPONIN I (HIGH SENSITIVITY)  TROPONIN I (HIGH SENSITIVITY)    EKG None  Radiology DG Chest 2 View  Result Date: 12/15/2020 CLINICAL DATA:  Shortness of breath EXAM: CHEST - 2 VIEW COMPARISON:  Chest radiograph 12/11/2020 FINDINGS: The cardiomediastinal silhouette is normal. There are patchy opacities in the left lower lobe, increased since 12/12/2019. The remainder of the lungs are clear. There is no pleural effusion or pneumothorax. There is no acute osseous abnormality. IMPRESSION: Patchy opacities in the left lower lobe are increased since 12/11/2020, concerning for left lower lobe pneumonia. As before, follow-up radiographs are recommended in 4-6 weeks to assess for resolution. Electronically Signed   By: Valetta Mole M.D.   On: 12/15/2020 14:26   CT Head Wo Contrast  Result Date: 12/15/2020 CLINICAL DATA:  Headache EXAM: CT HEAD WITHOUT CONTRAST TECHNIQUE: Contiguous axial images were obtained from the base of the skull through the vertex without intravenous contrast. COMPARISON:  Brain MRI 05/03/2008 FINDINGS: Brain: There is no  evidence of acute intracranial hemorrhage, extra-axial fluid collection, or acute infarct. The ventricles are normal in size. A focus of hypodensity in  the right parietal lobe is noted, nonspecific. There is no mass lesion. There is no midline shift. Vascular: No hyperdense vessel or unexpected calcification. Skull: Normal. Negative for fracture or focal lesion. Sinuses/Orbits: The imaged paranasal sinuses are clear. Bilateral lens implants are in place. The globes and orbits are otherwise unremarkable. Other: None. IMPRESSION: 1. No evidence of acute intracranial hemorrhage or infarct. 2. Single focus of hypodensity in the right parietal lobe is nonspecific and may reflect a small remote infarct or chronic white matter microangiopathic change. Electronically Signed   By: Valetta Mole M.D.   On: 12/15/2020 14:31    Procedures Procedures   Medications Ordered in ED Medications  sodium chloride 0.9 % bolus 1,000 mL (0 mLs Intravenous Stopped 12/15/20 1721)  ketorolac (TORADOL) 30 MG/ML injection 30 mg (30 mg Intravenous Given 12/15/20 1612)  acetaminophen (TYLENOL) tablet 650 mg (650 mg Oral Given 12/15/20 1601)  cefTRIAXone (ROCEPHIN) 2 g in sodium chloride 0.9 % 100 mL IVPB (0 g Intravenous Stopped 12/15/20 1721)  doxycycline (VIBRA-TABS) tablet 100 mg (100 mg Oral Given 12/15/20 1601)    ED Course  I have reviewed the triage vital signs and the nursing notes.  Pertinent labs & imaging results that were available during my care of the patient were reviewed by me and considered in my medical decision making (see chart for details).    MDM Rules/Calculators/A&P                           Patient has a left lower lobe pneumonia.  She is nontoxic and not hypoxic.  She is sent home on doxycycline.  She had COVID test 3 days ago in the emergency department was negative but we will repeat that here and those results can be gone over with her when she sees her doctor Tuesday Final Clinical Impression(s) / ED Diagnoses Final diagnoses:  Community acquired pneumonia of left lower lobe of lung    Rx / DC Orders ED Discharge Orders          Ordered     doxycycline (VIBRAMYCIN) 100 MG capsule  2 times daily        12/15/20 1721             Milton Ferguson, MD 12/15/20 1725

## 2020-12-15 NOTE — ED Provider Notes (Signed)
Emergency Medicine Provider Triage Evaluation Note  [name] [name] , a 70 y.o. female  was evaluated in triage.  Pt complains of headache.  Started on Wednesday night after taking a nitroglycerin for chest pain.  Headache has been constant since and worsening.  No history of headaches before this last week.  Reports the chest pain is improved, but she is still feeling short of breath.  History of stent placement, most recent echo was in August 2022 showing LVEF 60%.  Review of Systems  Positive: Headache, shortness of breath, chest tightness, cough Negative: Nausea, vomiting  Physical Exam  BP (!) 157/71 (BP Location: Right Arm)   Pulse 78   Temp 100.2 F (37.9 C) (Oral)   Resp 18   SpO2 94%  Gen:   Awake, no distress   Resp:  Normal effort  MSK:   Moves extremities without difficulty  Other:    Medical Decision Making  Medically screening exam initiated at 1:48 PM.  Appropriate orders placed.  Verl Blalock was informed that the remainder of the evaluation will be completed by another provider, this initial triage assessment does not replace that evaluation, and the importance of remaining in the ED until their evaluation is complete.  Patient primarily here for headache relief, we will proceed with CT head because he has not had that done yet in previous visits and the headache is worsening.  We will also check chest pain labs given her history and feeling short of breath.   Sherrill Raring, PA-C 12/15/20 1349    Milton Ferguson, MD 12/16/20 [phone]

## 2020-12-15 NOTE — Discharge Instructions (Addendum)
Follow-up with your doctor Tuesday for recheck.  Take Tylenol and Motrin for fever and aches.  Drink plenty of fluids.  Your COVID test will be back tomorrow

## 2020-12-15 NOTE — ED Triage Notes (Signed)
Pt c/o headache and cough for the past few days.

## 2020-12-18 ENCOUNTER — Ambulatory Visit: Payer: Medicare Other | Admitting: Family Medicine

## 2020-12-18 NOTE — Telephone Encounter (Signed)
Pt went to urgent care and again to the ED is being treated for pneumonia Per pt is feeling better at this time ./cy

## 2021-01-02 NOTE — Progress Notes (Signed)
    SUBJECTIVE:   CHIEF COMPLAINT / HPI: refills, second Covid booster and Flu vaccine  Patient was seen in ED for CAP 09/18 and treated with 7 day course Doxycycline for which she has now completed.  She reports still some mild shortness of breath with activity but is improving.  No fevers or productive cough.  Med refills for Flexeril, Flonase Protonix  PERTINENT  PMH / PSH:  CAP resolved HTN GERD Neuropathy Chronic low back pain  OBJECTIVE:   BP 124/65   Pulse 72   Ht 5\' 2"  (1.575 m)   Wt 179 lb (81.2 kg)   SpO2 98%   BMI 32.74 kg/m    General: Alert, no acute distress Cardio: Normal S1 and S2, RRR, no r/m/g Pulm: CTAB, normal work of breathing Abdomen: Bowel sounds normal. Abdomen soft and non-tender.  Extremities: No peripheral edema.   ASSESSMENT/PLAN:   Encounter for medication review and counseling Flexeril discontinued as not appropriate for given age. Will start Zanaflex 4 mg q8h prn for muscle spasms.   Protonix decreased from 40 mg BID to 20 mg BID. Plan to slowly discontinue and transition to H2A if needed given increase in side effects for long term use. Switched Gabapentin from 200 mg at night to 100 mg BID as was making patient sleepy. Flonase nasal spray refilled Discussed changes with patients and risks of continuing medications on [facility] list. Patient agreeable to changes listed above.   Will continue to monitor. Follow up in 3 months  Prediabetes A1c 6.1 today.  Goal <7 given age Continue diet and exercise Follow up in 6 months  Hospital discharge follow-up CAP resolved.  Completed 7 days Doxycycline.  No fevers, chills or productive cough.  Continues to have some mild shortness of breath with activity but this is improving.  Lung exam today benign. -Reassurance provided -Follow up as needed -Strict return precautions provided  Flu vaccine and COVID booster given today Second Shingles vaccine prescription sent to pharmacy     [name] Leitz, MD [place]

## 2021-01-02 NOTE — Patient Instructions (Addendum)
Thank you for coming to see me today. It was a pleasure. Today we talked about:   You should pay attention to your hemoglobin A1C.  It is a three month test about your average blood sugar. If the A1C is - <7.0 is great.  That is our goal for treating you. - Between 7.0 and 9.0 is not so good.  We would need to work to do better. - Above 9.0 is terrible.  You would really need to work with [place] to get it under control.    Today's A1C = 6.1   Reordered your medications today Stop Flexeril Start Zanaflex 4 mg every 8 hours as needed Take Gabapentin 100 mg two times a day Take Protonix 20 mg two times a day  Shingles vaccine is sent to your pharmacy.  This is your second and last dose  Please follow-up with PCP in 3 months  If you have any questions or concerns, please do not hesitate to call the office at [phone].  Best,   Carollee Leitz, MD

## 2021-01-07 ENCOUNTER — Encounter: Payer: Self-pay | Admitting: Family Medicine

## 2021-01-07 ENCOUNTER — Ambulatory Visit (INDEPENDENT_AMBULATORY_CARE_PROVIDER_SITE_OTHER): Payer: Medicare Other

## 2021-01-07 ENCOUNTER — Ambulatory Visit (INDEPENDENT_AMBULATORY_CARE_PROVIDER_SITE_OTHER): Payer: Medicare Other | Admitting: Family Medicine

## 2021-01-07 ENCOUNTER — Other Ambulatory Visit: Payer: Self-pay

## 2021-01-07 VITALS — BP 124/65 | HR 72 | Ht 62.0 in | Wt 179.0 lb

## 2021-01-07 DIAGNOSIS — Z23 Encounter for immunization: Secondary | ICD-10-CM

## 2021-01-07 DIAGNOSIS — R7303 Prediabetes: Secondary | ICD-10-CM | POA: Diagnosis not present

## 2021-01-07 DIAGNOSIS — Z7189 Other specified counseling: Secondary | ICD-10-CM | POA: Diagnosis not present

## 2021-01-07 DIAGNOSIS — Z09 Encounter for follow-up examination after completed treatment for conditions other than malignant neoplasm: Secondary | ICD-10-CM

## 2021-01-07 LAB — POCT GLYCOSYLATED HEMOGLOBIN (HGB A1C): HbA1c, POC (prediabetic range): 6.1 % (ref 5.7–6.4)

## 2021-01-07 MED ORDER — SHINGRIX 50 MCG/0.5ML IM SUSR: 0.5000 mL | Freq: Once | INTRAMUSCULAR | 0 refills | Status: AC

## 2021-01-07 MED ORDER — PANTOPRAZOLE SODIUM 20 MG PO TBEC: 40.0000 mg | DELAYED_RELEASE_TABLET | Freq: Two times a day (BID) | ORAL | 0 refills | Status: DC

## 2021-01-07 MED ORDER — FLUTICASONE PROPIONATE 50 MCG/ACT NA SUSP: 1.0000 | Freq: Every day | NASAL | 1 refills | Status: DC

## 2021-01-07 MED ORDER — GABAPENTIN 100 MG PO CAPS: 100.0000 mg | ORAL_CAPSULE | Freq: Two times a day (BID) | ORAL | 0 refills | Status: DC

## 2021-01-07 MED ORDER — TIZANIDINE HCL 4 MG PO TABS: 4.0000 mg | ORAL_TABLET | Freq: Three times a day (TID) | ORAL | 0 refills | Status: DC | PRN

## 2021-01-09 ENCOUNTER — Telehealth: Payer: Self-pay | Admitting: *Deleted

## 2021-01-09 NOTE — Telephone Encounter (Signed)
Contacted pt to confirm that she wanted her Rx to go to [facility] and she said she has decided to stay with the [facility].  I have removed the [facility] from her chart. Calem Cocozza Zimmerman Rumple, CMA

## 2021-01-11 ENCOUNTER — Encounter: Payer: Self-pay | Admitting: Family Medicine

## 2021-01-11 DIAGNOSIS — Z7189 Other specified counseling: Secondary | ICD-10-CM | POA: Insufficient documentation

## 2021-01-11 DIAGNOSIS — Z09 Encounter for follow-up examination after completed treatment for conditions other than malignant neoplasm: Secondary | ICD-10-CM | POA: Insufficient documentation

## 2021-01-11 NOTE — Assessment & Plan Note (Signed)
Flexeril discontinued as not appropriate for given age. Will start Zanaflex 4 mg q8h prn for muscle spasms.   Protonix decreased from 40 mg BID to 20 mg BID. Plan to slowly discontinue and transition to H2A if needed given increase in side effects for long term use. Switched Gabapentin from 200 mg at night to 100 mg BID as was making patient sleepy. Flonase nasal spray refilled Discussed changes with patients and risks of continuing medications on [facility] list. Patient agreeable to changes listed above.   Will continue to monitor. Follow up in 3 months

## 2021-01-11 NOTE — Assessment & Plan Note (Signed)
CAP resolved.  Completed 7 days Doxycycline.  No fevers, chills or productive cough.  Continues to have some mild shortness of breath with activity but this is improving.  Lung exam today benign. -Reassurance provided -Follow up as needed -Strict return precautions provided

## 2021-01-11 NOTE — Assessment & Plan Note (Signed)
A1c 6.1 today.  Goal <7 given age Continue diet and exercise Follow up in 6 months

## 2021-01-16 NOTE — Progress Notes (Signed)
Cardiology Office Note   Date:  01/16/2021   ID:  [name] [name], [name] [name] 08/17/1950, MRN [id number]  PCP:  Dana Allan, MD  Cardiologist: Dr. Jens Som CC: Follow Up Palpitations and chest pain    History of Present Illness: [name] [name] is a 70 y.o. female who presents for ongoing assessment and management of coronary artery disease, hypertension, hyperlipidemia, with prior history of orthostasis and palpitations.    Most recent cardiac catheterization December 2018 showed an 80% ostial ramus with an EF of 55%.  The patient had PCI with drug-eluting stent.  When seen last on 10/11/2020 she complained of "spells of heart fluttering" with associated dizziness.  These were sudden in onset and lasted approximately 1 minute resolving spontaneously.  Dr. Jens Som arranged a outpatient monitor to evaluate frequency and morphology of her palpitations.  On 11/04/2020 Dr. Jens Som reviewed her cardiac monitor which revealed sinus bradycardia, normal sinus rhythm, sinus tachycardia, occasional PACs with brief PAT and rare PVCs.  He recommended no change in medication as there were no significant arrhythmias seen.    She called our office on 12/12/2020 with complaints of chest discomfort.  This had been lasting over 3 days.  She was seen in urgent care and again in the ED and was being treated for pneumonia.  She is feeling some better after recovering from pneumonia but she will have some weakness on days occasionally.  At that time she normally just rest.  Breathing status has improved.  Primary care has changed around muscle relaxer and taken her off of Flexeril and placed her on Zanaflex which she cannot tolerate causing significant dizziness and drowsiness.  She has reduced her dose to half a tablet on her own which seems to be helpful.  She is due to follow-up with them next week.  Past Medical History:  Diagnosis Date   Arthritis    Blood transfusion without reported diagnosis    Cataract     Coronary artery disease    Depression    Hyperlipidemia    Hypertension    Microcytic anemia    Mitral valve prolapse     Past Surgical History:  Procedure Laterality Date   ABDOMINAL HYSTERECTOMY     CARDIAC CATHETERIZATION     CATARACT EXTRACTION, BILATERAL     COLONOSCOPY     greater than 10 years ago   CORONARY STENT INTERVENTION N/A 03/01/2017   Procedure: CORONARY STENT INTERVENTION;  Surgeon: Kathleene Hazel, MD;  Location: [facility];  Service: Cardiovascular;  Laterality: N/A;   INTRAVASCULAR PRESSURE WIRE/FFR STUDY N/A 03/01/2017   Procedure: INTRAVASCULAR PRESSURE WIRE/FFR STUDY;  Surgeon: Kathleene Hazel, MD;  Location: [facility];  Service: Cardiovascular;  Laterality: N/A;   LEFT HEART CATH AND CORONARY ANGIOGRAPHY N/A 03/01/2017   Procedure: LEFT HEART CATH AND CORONARY ANGIOGRAPHY;  Surgeon: Kathleene Hazel, MD;  Location: [facility];  Service: Cardiovascular;  Laterality: N/A;   PARTIAL HYSTERECTOMY       Current Outpatient Medications  Medication Sig Dispense Refill   amLODipine (NORVASC) 5 MG tablet Take 1 tablet (5 mg total) by mouth daily. 90 tablet 3   aspirin 81 MG tablet Take 81 mg by mouth daily.     atorvastatin (LIPITOR) 80 MG tablet TAKE 1 TABLET BY MOUTH EVERY DAY 90 tablet 3   conjugated estrogens (PREMARIN) vaginal cream Apply vaginally daily x 2 weeks then twice weekly thereafter 42.5 g 12   ezetimibe (ZETIA) 10 MG tablet  Take 1 tablet (10 mg total) by mouth daily. 90 tablet 3   ferrous sulfate 325 (65 FE) MG tablet TAKE 1 TABLET BY MOUTH EVERY DAY WITH BREAKFAST 90 tablet 1   fluticasone (FLONASE) 50 MCG/ACT nasal spray Place 1 spray into both nostrils daily. 9.9 mL 1   gabapentin (NEURONTIN) 100 MG capsule Take 1 capsule (100 mg total) by mouth 2 (two) times daily. 180 capsule 0   metoprolol tartrate (LOPRESSOR) 25 MG tablet Take 0.5 tablets (12.5 mg total) by mouth 2 (two) times daily. 90 tablet 3    nitroGLYCERIN (NITROSTAT) 0.4 MG SL tablet Place 1 tablet (0.4 mg total) under the tongue every 5 (five) minutes as needed for chest pain. 25 tablet 7   pantoprazole (PROTONIX) 20 MG tablet Take 2 tablets (40 mg total) by mouth 2 (two) times daily. 360 tablet 0   sertraline (ZOLOFT) 100 MG tablet TAKE 1 TABLET BY MOUTH EVERY DAY 90 tablet 0   tiZANidine (ZANAFLEX) 4 MG tablet Take 1 tablet (4 mg total) by mouth every 8 (eight) hours as needed for muscle spasms. 30 tablet 0   No current facility-administered medications for this visit.    Allergies:   Patient has no known allergies.    Social History:  The patient  reports that she quit smoking about 16 years ago. Her smoking use included cigarettes. She has a 10.00 pack-year smoking history. She has never used smokeless tobacco. She reports current alcohol use. She reports that she does not use drugs.   Family History:  The patient's family history includes Cancer in her father; Dementia in her sister; Diabetes in her mother; Esophageal cancer in her brother and sister; Heart disease in her mother; Hypertension in her mother and sister; Pancreatitis in her sister.    ROS: All other systems are reviewed and negative. Unless otherwise mentioned in H&P    PHYSICAL EXAM: VS:  There were no vitals taken for this visit. , BMI There is no height or weight on file to calculate BMI. GEN: Well nourished, well developed, in no acute distress HEENT: normal Neck: no JVD, left carotid bruit, soft right carotid bruit, no masses Cardiac: RRR; soft systolic murmur at the left sternal boarder, no rubs, or gallops,no edema  Respiratory:  Clear to auscultation bilaterally, normal work of breathing, some crackles in the right base, no wheezes. GI: soft, nontender, nondistended, + BS MS: no deformity or atrophy Skin: warm and dry, no rash Neuro:  Strength and sensation are intact Psych: euthymic mood, full affect   EKG:  EKG is not ordered  today.    Recent Labs: 04/22/2020: TSH 0.820 12/15/2020: ALT 67; BUN 10; Creatinine, Ser 0.78; Hemoglobin 12.1; Platelets 413; Potassium 4.1; Sodium 136    Lipid Panel    Component Value Date/Time   CHOL 149 04/22/2020 1530   TRIG 103 04/22/2020 1530   HDL 69 04/22/2020 1530   CHOLHDL 2.2 04/22/2020 1530   CHOLHDL 3.1 05/29/2015 1527   VLDL 17 05/29/2015 1527   LDLCALC 61 04/22/2020 1530   LDLDIRECT 129 (H) 01/01/2010 2219      Wt Readings from Last 3 Encounters:  01/07/21 179 lb (81.2 kg)  12/15/20 180 lb 12.4 oz (82 kg)  10/11/20 179 lb 6.4 oz (81.4 kg)      Other studies Reviewed: Echocardiogram 11-26-20 . Left ventricular ejection fraction, by estimation, is 60 to 65%. The  left ventricle has normal function. The left ventricle has no regional  wall motion abnormalities.  Left ventricular diastolic parameters were  normal. The average left ventricular  global longitudinal strain is -24.5 %. The global longitudinal strain is  normal.   2. Right ventricular systolic function is normal. The right ventricular  size is normal.   3. The mitral valve is normal in structure. Trivial mitral valve  regurgitation. No evidence of mitral stenosis.   4. The aortic valve is tricuspid. Aortic valve regurgitation is not  visualized. Mild aortic valve sclerosis is present, with no evidence of  aortic valve stenosis.   5. The inferior vena cava is normal in size with greater than 50%  respiratory variability, suggesting right atrial     ASSESSMENT AND PLAN:  1.  Hypertension: Excellent control of blood pressure on current medication regimen.  No changes at this time.  2.  Palpitations: Denies any recurrent palpitations.  3.  Community-acquired pneumonia: Has recently finished treatment continues to have some fatigue and cough.  No significant dyspnea.  The followed by PCP.  4.  Carotid bruit: Noted on the left greater then on the right.  May consider carotid Doppler  ultrasound on follow-up visit if she continues to have feelings of dizziness.  She is not had any complaints of headache, or evidence of TIA.  5.  Hyperlipidemia: Remains on Lipitor 80 mg daily.  Labs followed by PCP.   Current medicines are reviewed at length with the patient today.  I have spent 25 min's  dedicated to the care of this patient on the date of this encounter to include pre-visit review of records, assessment, management and diagnostic testing,with shared decision making.  Labs/ tests ordered today include: None  Bettey Mare. Liborio Nixon, ANP, AACC   01/16/2021 12:23 PM    [facility] [address] Office [phone] Fax [phone]  Notice: This dictation was prepared with Dragon dictation along with smaller phrase technology. Any transcriptional errors that result from this process are unintentional and may not be corrected upon review.

## 2021-01-17 ENCOUNTER — Encounter: Payer: Self-pay | Admitting: Adult Health

## 2021-01-17 ENCOUNTER — Ambulatory Visit (INDEPENDENT_AMBULATORY_CARE_PROVIDER_SITE_OTHER): Payer: Medicare Other | Admitting: Adult Health

## 2021-01-17 ENCOUNTER — Other Ambulatory Visit: Payer: Self-pay

## 2021-01-17 VITALS — BP 132/62 | HR 62 | Ht 62.0 in | Wt 178.6 lb

## 2021-01-17 DIAGNOSIS — R0989 Other specified symptoms and signs involving the circulatory and respiratory systems: Secondary | ICD-10-CM

## 2021-01-17 DIAGNOSIS — I10 Essential (primary) hypertension: Secondary | ICD-10-CM | POA: Diagnosis not present

## 2021-01-17 DIAGNOSIS — R002 Palpitations: Secondary | ICD-10-CM | POA: Diagnosis not present

## 2021-01-17 DIAGNOSIS — E7800 Pure hypercholesterolemia, unspecified: Secondary | ICD-10-CM

## 2021-01-17 NOTE — Patient Instructions (Signed)
Medication Instructions:  No Changes *If you need a refill on your cardiac medications before your next appointment, please call your pharmacy*   Lab Work: BMET, Magnesium:  Today If you have labs (blood work) drawn today and your tests are completely normal, you will receive your results only by: [place] (if you have MyChart) OR A paper copy in the mail If you have any lab test that is abnormal or we need to change your treatment, we will call you to review the results.   Testing/Procedures: No Testing   Follow-Up: At [facility], you and your health needs are our priority.  As part of our continuing mission to provide you with exceptional heart care, we have created designated Provider Care Teams.  These Care Teams include your primary Cardiologist (physician) and Advanced Practice Providers (APPs -  Physician Assistants and Nurse Practitioners) who all work together to provide you with the care you need, when you need it.  We recommend signing up for the patient portal called "MyChart".  Sign up information is provided on this After Visit Summary.  MyChart is used to connect with patients for Virtual Visits (Telemedicine).  Patients are able to view lab/test results, encounter notes, upcoming appointments, etc.  Non-urgent messages can be sent to your provider as well.   To learn more about what you can do with MyChart, go to [email or web address].    Your next appointment:   3 month(s)  The format for your next appointment:   In Person  Provider:   [name], MD or Jory Sims, NP

## 2021-01-18 LAB — HEPATIC FUNCTION PANEL
ALT: 17 IU/L (ref 0–32)
AST: 14 IU/L (ref 0–40)
Albumin: 4.6 g/dL (ref 3.7–4.7)
Alkaline Phosphatase: 81 IU/L (ref 44–121)
Bilirubin Total: 0.7 mg/dL (ref 0.0–1.2)
Bilirubin, Direct: 0.19 mg/dL (ref 0.00–0.40)
Total Protein: 7.5 g/dL (ref 6.0–8.5)

## 2021-01-18 LAB — BASIC METABOLIC PANEL
BUN/Creatinine Ratio: 13 (ref 12–28)
BUN: 12 mg/dL (ref 8–27)
CO2: 22 mmol/L (ref 20–29)
Calcium: 9.9 mg/dL (ref 8.7–10.3)
Chloride: 106 mmol/L (ref 96–106)
Creatinine, Ser: 0.89 mg/dL (ref 0.57–1.00)
Glucose: 105 mg/dL — ABNORMAL HIGH (ref 70–99)
Potassium: 4.7 mmol/L (ref 3.5–5.2)
Sodium: 143 mmol/L (ref 134–144)
eGFR: 69 mL/min/{1.73_m2} (ref >59)

## 2021-01-18 LAB — LIPID PANEL
Chol/HDL Ratio: 2.4 ratio (ref 0.0–4.4)
Cholesterol, Total: 146 mg/dL (ref 100–199)
HDL: 61 mg/dL (ref >39)
LDL Chol Calc (NIH): 67 mg/dL (ref 0–99)
Triglycerides: 95 mg/dL (ref 0–149)
VLDL Cholesterol Cal: 18 mg/dL (ref 5–40)

## 2021-01-18 LAB — MAGNESIUM: Magnesium: 2.1 mg/dL (ref 1.6–2.3)

## 2021-01-20 ENCOUNTER — Encounter: Payer: Self-pay | Admitting: *Deleted

## 2021-01-20 ENCOUNTER — Telehealth: Payer: Self-pay

## 2021-01-20 NOTE — Telephone Encounter (Addendum)
Called Patient regarding Lab results----- Message from Lendon Colonel, NP sent at 01/18/2021 10:28 AM EDT ----- I have reviewed her labs. All are okay. No changes in medications or plan.   K L

## 2021-01-21 ENCOUNTER — Telehealth: Payer: Self-pay

## 2021-01-21 ENCOUNTER — Telehealth: Payer: Self-pay | Admitting: Adult Health

## 2021-01-21 NOTE — Telephone Encounter (Signed)
Spoke with patient of [name] Bears NP/Dr. Stanford Breed  She repots she had left lobe pneumonia but saw [name] [name] on 10/21 and she was told she heard fluid in in the right lobe  She has recurrent issues with DOE and feeling hot when doing minimal activity   Patient would like to know what follow up is recommended, if she needs additional medication   She wanted to follow up with [place] first, as she saw our providers most recently, but can follow up with PCP if this is recommended  She had labs done recently, but CBC was not checked  Advised will send to MD/NP to review

## 2021-01-21 NOTE — Telephone Encounter (Signed)
Patient calls nurse line regarding scheduling appointment to follow up on pneumonia. Patient reports that at cardiology appointment on 10/21, she was told she had fluid on right lung.   Patient was told by cardiology office to follow up with PCP. Patient reports that she has been having slight SHOB since last visit with Dr. Volanda Napoleon. Patient is able to speak in complete sentences and does not appear to be in distress. Denies CP, fever, body aches.   Scheduled for follow up on 10/27 in [facility] with Dr. Caron Presume.   ED precautions given.   Talbot Grumbling, RN

## 2021-01-21 NOTE — Telephone Encounter (Signed)
Patient is called stating that last week when she was in they heard fluid in the right lung, she is having same issues as before. She is wondering if she will put her on some type of antibiotic. Or if she would defer her to go back to her PCP.

## 2021-01-21 NOTE — Telephone Encounter (Signed)
Spoke with pt, Aware of dr crenshaw's recommendations.  °

## 2021-01-23 ENCOUNTER — Other Ambulatory Visit: Payer: Self-pay

## 2021-01-23 ENCOUNTER — Ambulatory Visit (INDEPENDENT_AMBULATORY_CARE_PROVIDER_SITE_OTHER): Payer: Medicare Other | Admitting: Family Medicine

## 2021-01-23 VITALS — BP 133/64 | HR 62 | Ht 62.0 in | Wt 180.0 lb

## 2021-01-23 DIAGNOSIS — J189 Pneumonia, unspecified organism: Secondary | ICD-10-CM | POA: Diagnosis not present

## 2021-01-23 NOTE — Progress Notes (Signed)
    SUBJECTIVE:   CHIEF COMPLAINT / HPI:   Pneumonia follow-up Patient reports that she was diagnosed with pneumonia approximately month and a half ago.  She recently went and saw her cardiologist who reports she heard fluid in her right lung.  Patient reports that pneumonia was in her left lung and the patient is concerned about the fluid that may be in her right lung.  She denies any sick symptoms or shortness of breath.  Denies any chest pain.  She has a recent echo which showed normal cardiac function.  Denies any swelling in her lower extremities.  OBJECTIVE:   BP 133/64   Pulse 62   Ht 5\' 2"  (1.575 m)   Wt 180 lb (81.6 kg)   SpO2 98%   BMI 32.92 kg/m   General: Well-appearing 70 year old female in no acute distress Cardiac: Regular rate and rhythm, no murmurs appreciated Respiratory: Normal work of breathing, lungs clear to auscultation bilaterally, no crackles appreciated at either lung base Abdomen: Soft, nontender, positive bowel sounds MSK: No gross abnormalities, no edema in lower extremities  ASSESSMENT/PLAN:   Pneumonia of left lower lobe due to infectious organism Patient with recent history of pneumonia in the left lower lobe.  Chest x-ray recommended repeat imaging in 4 to 6 weeks.  Patient is asymptomatic at this time and lungs are clear to auscultation.  Recommended patient get x-ray in 1-2 weeks for the follow-up chest x-ray.  Patient is agreeable.  Strict return precautions given.     Gifford Shave, MD [place]

## 2021-01-23 NOTE — Assessment & Plan Note (Signed)
Patient with recent history of pneumonia in the left lower lobe.  Chest x-ray recommended repeat imaging in 4 to 6 weeks.  Patient is asymptomatic at this time and lungs are clear to auscultation.  Recommended patient get x-ray in 1-2 weeks for the follow-up chest x-ray.  Patient is agreeable.  Strict return precautions given.

## 2021-01-23 NOTE — Patient Instructions (Signed)
It was great seeing you today.  I do not hear any fluid in your lungs and your breathing extremely well.  Your blood pressure also looks very well controlled.  I have no concerns at this time.  If you notice increase shortness of breath or chest pain I want you to be seen again.  I hope you have a wonderful afternoon please let me know if you have any questions or concerns.

## 2021-01-30 ENCOUNTER — Other Ambulatory Visit: Payer: Self-pay | Admitting: Family Medicine

## 2021-03-18 DIAGNOSIS — H04123 Dry eye syndrome of bilateral lacrimal glands: Secondary | ICD-10-CM | POA: Diagnosis not present

## 2021-03-26 ENCOUNTER — Other Ambulatory Visit: Payer: Self-pay | Admitting: Family Medicine

## 2021-03-26 DIAGNOSIS — I2510 Atherosclerotic heart disease of native coronary artery without angina pectoris: Secondary | ICD-10-CM

## 2021-03-31 ENCOUNTER — Telehealth: Payer: Self-pay | Admitting: Family Medicine

## 2021-03-31 NOTE — Telephone Encounter (Signed)
**  After Hours/ Emergency Line Call**  Received a call to report that [name] [name] that her husband went to [place] long due to concern for pneumonia.  She states he had xrays and blood work and was negative for [employer].  She states that due to the long wait he left without being seen by physician and attempted to return to the ED the next day but there was a 12-hour or longer wait.  She states he is having cough, fever, congestion.  Denying shortness of breath or chest pain in him.  She request getting an office appointment tomorrow to avoid going back to the emergency department.  I attempted to schedule her but there was nothing available for me to schedule.  I recommended that as long as he maintained no shortness of breath, chest pain, confusion that it would be reasonable to reach out first thing in the morning to see if anything is opened up.  I did offer to try to schedule him for a visit on Wednesday or Thursday of this week but she preferred to call in the morning to see if he could be seen on 1/3.  I discussed that if anything worsens if he develops chest pain, if he develops shortness of breath or confusion he needs to go to the emergency department.  She expressed that she understands that but truly hopes to avoid it as the weight is so long.  Red flags discussed.  Will forward to PCP.  Lurline Del, DO PGY-3, [facility] 03/31/2021 11:11 PM

## 2021-04-03 IMAGING — MG DIGITAL SCREENING BILATERAL MAMMOGRAM WITH TOMO AND CAD
8 series · 8 of 24 positions shown · non-contrast
Comparison: Previous exam(s).

CLINICAL DATA: Screening.

EXAM:
DIGITAL SCREENING BILATERAL MAMMOGRAM WITH TOMO AND CAD

[R CC synth-2D]
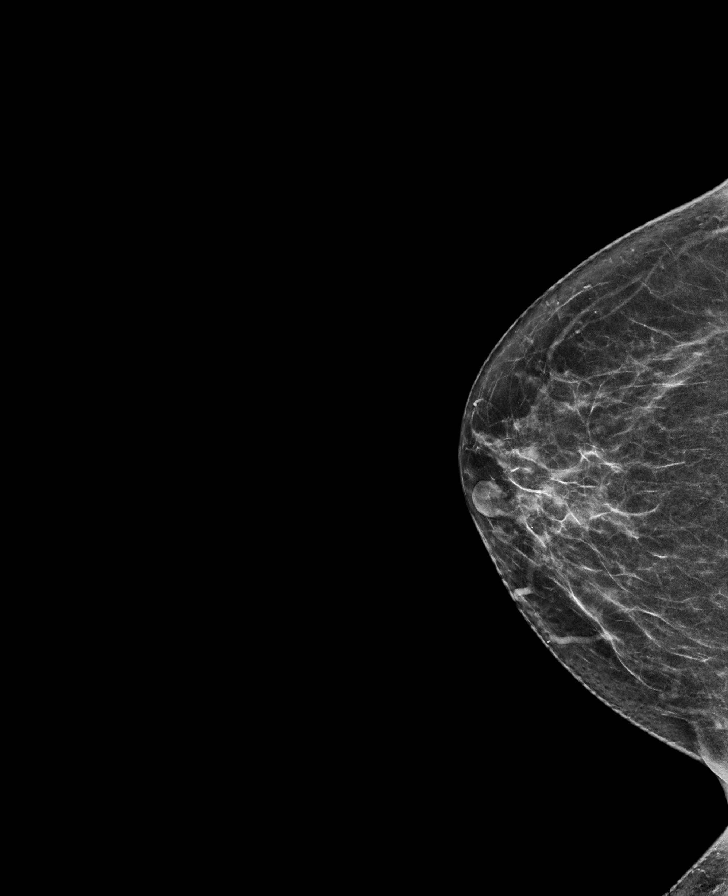

[L CC synth-2D]
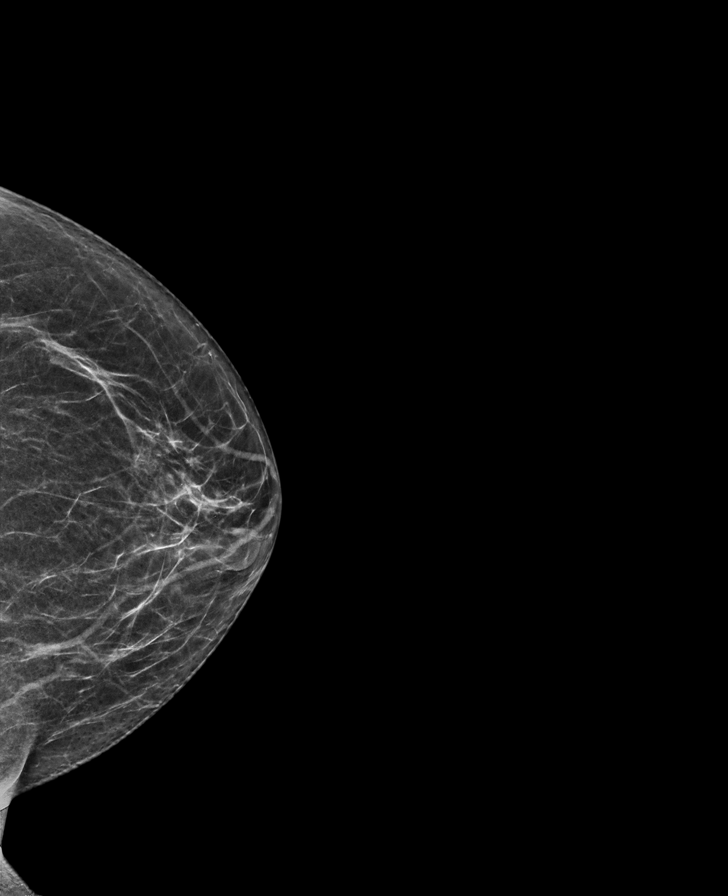

[L MLO synth-2D]
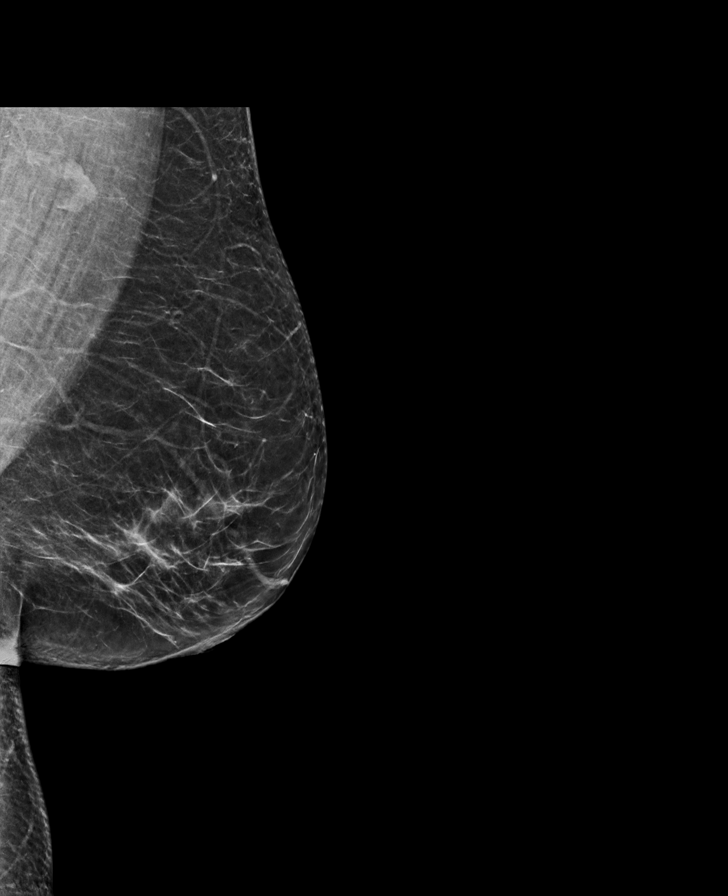

[R MLO synth-2D]
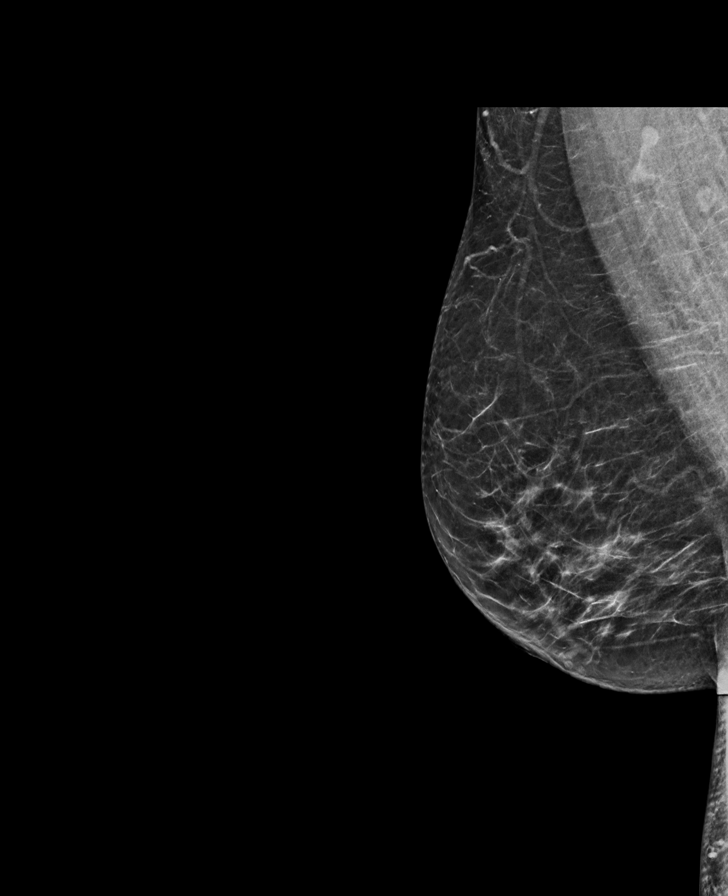

[R MLO tomo · tomo slice 33/66.0]
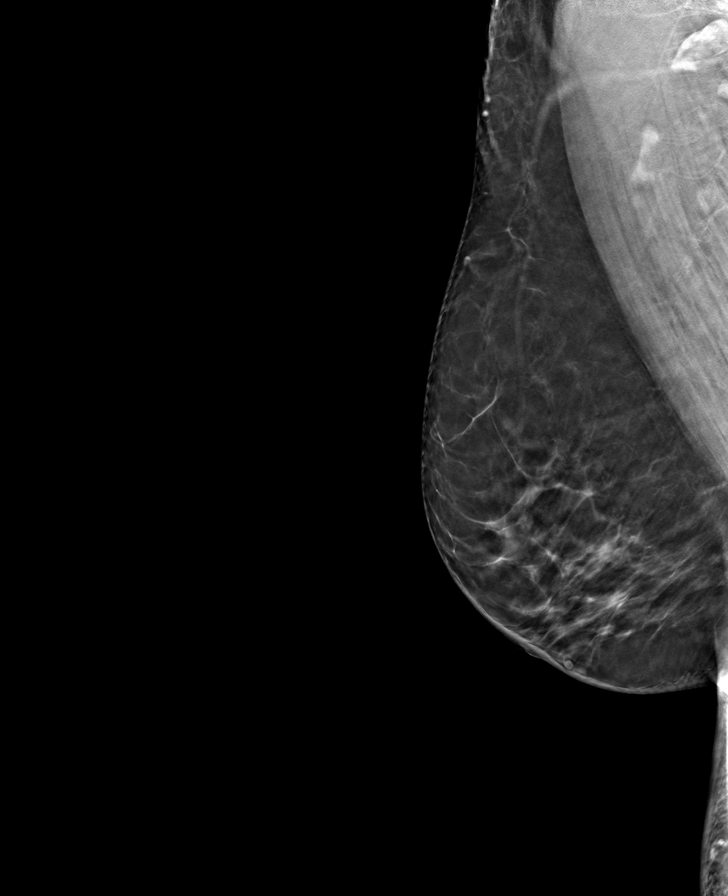

[L CC tomo · tomo slice 29/58.0]
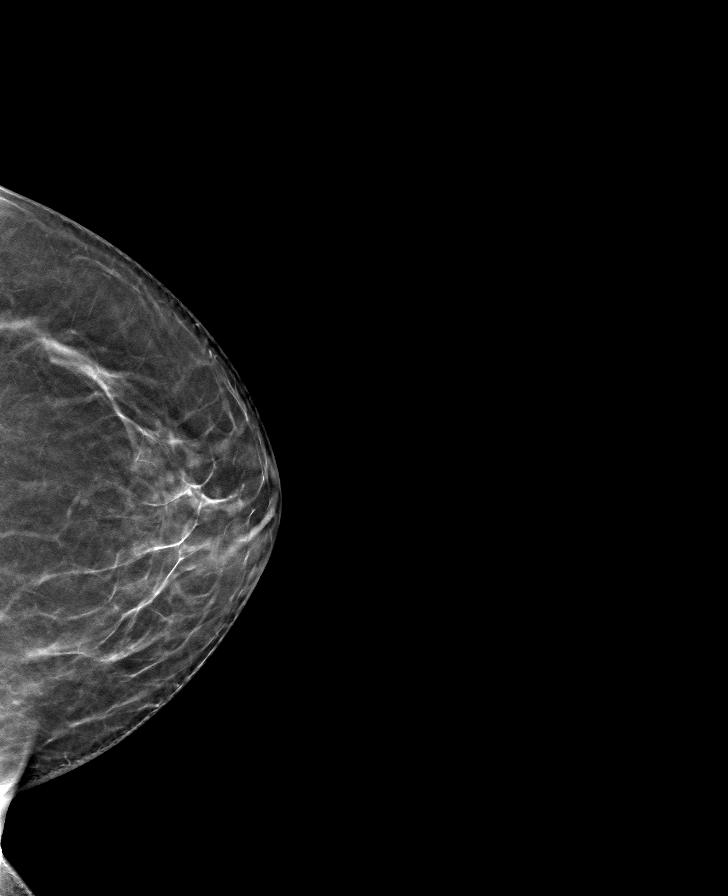

[L MLO tomo · tomo slice 35/69.0]
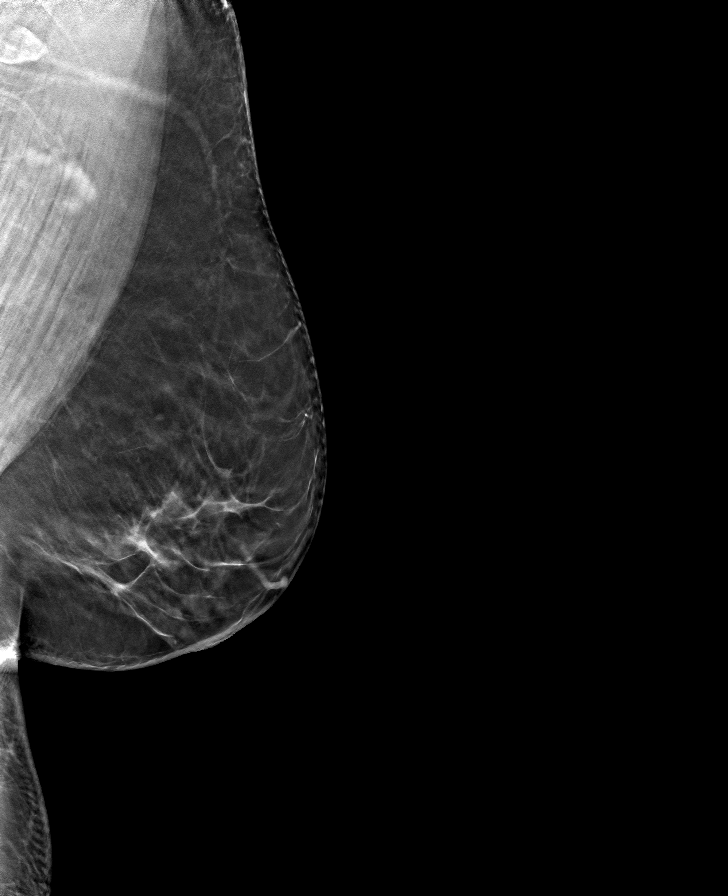

[R CC tomo · tomo slice 31/60.0]
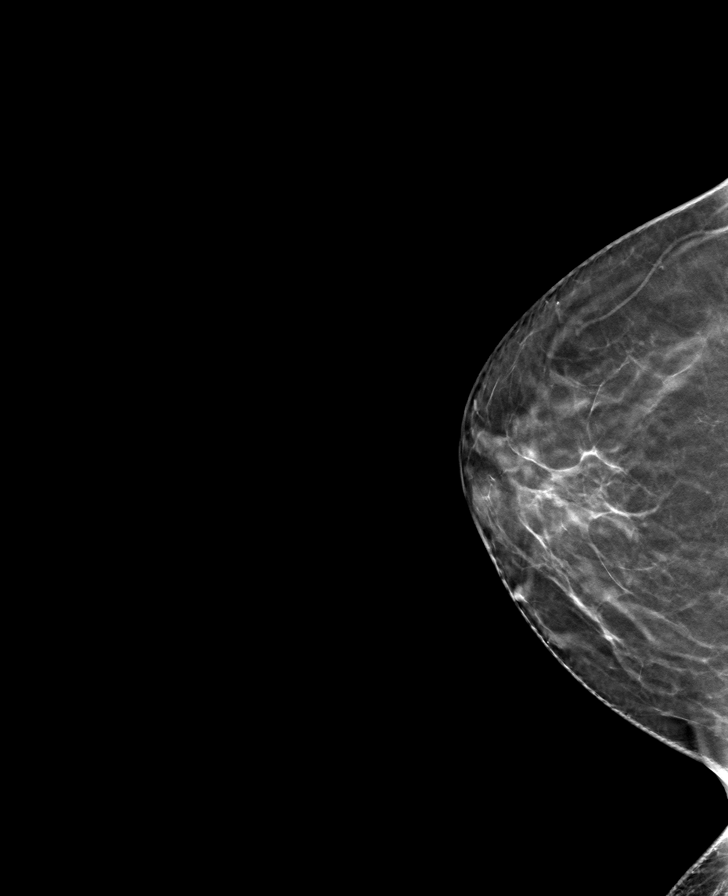

[8 of 24 positions shown; findings below may reference images not displayed]

ACR Breast Density Category b: There are scattered areas of
fibroglandular density.
FINDINGS: There are no findings suspicious for malignancy. Images were
processed with CAD.
IMPRESSION: No mammographic evidence of malignancy. A result letter of this
screening mammogram will be mailed directly to the patient.

RECOMMENDATION:
Screening mammogram in one year. (Code:[id number])

BI-RADS CATEGORY  1: Negative.

## 2021-04-24 NOTE — Progress Notes (Signed)
Cardiology Clinic Note   Patient Name: [name] [name] Date of Encounter: 04/25/2021  Primary Care Provider:  Carollee Leitz, MD Primary Cardiologist:  Dr.  Stanford Breed   Patient Profile    71 year old female with known history of coronary artery disease, hypertension, hyperlipidemia, and prior history of orthostatic hypotension and palpitations.  Most recent catheterization December 2018 revealing an 80% ostial ramus with an EF of 55%.  She is status post PCI with drug-eluting stent.  She also has a history of palpitations, status post cardiac monitor on 11/04/2020 which revealed sinus bradycardia normal sinus rhythm sinus tachycardia occasional PACs and brief PAT and rare PVCs.  Past Medical History    Past Medical History:  Diagnosis Date   Arthritis    Blood transfusion without reported diagnosis    Cataract    Coronary artery disease    Depression    Hyperlipidemia    Hypertension    Microcytic anemia    Mitral valve prolapse    Past Surgical History:  Procedure Laterality Date   ABDOMINAL HYSTERECTOMY     CARDIAC CATHETERIZATION     CATARACT EXTRACTION, BILATERAL     COLONOSCOPY     greater than 10 years ago   CORONARY STENT INTERVENTION N/A 03/01/2017   Procedure: CORONARY STENT INTERVENTION;  Surgeon: Burnell Blanks, MD;  Location: [place] [facility];  Service: Cardiovascular;  Laterality: N/A;   INTRAVASCULAR PRESSURE WIRE/FFR STUDY N/A 03/01/2017   Procedure: INTRAVASCULAR PRESSURE WIRE/FFR STUDY;  Surgeon: Burnell Blanks, MD;  Location: [place] [facility];  Service: Cardiovascular;  Laterality: N/A;   LEFT HEART CATH AND CORONARY ANGIOGRAPHY N/A 03/01/2017   Procedure: LEFT HEART CATH AND CORONARY ANGIOGRAPHY;  Surgeon: Burnell Blanks, MD;  Location: [place] [facility];  Service: Cardiovascular;  Laterality: N/A;   PARTIAL HYSTERECTOMY      Allergies  No Known Allergies  History of Present Illness    Mrs. [name] [name] is a very pleasant  71 year old female patient we are following for ongoing assessment and management of coronary artery disease status post PCI to the ostial ramus in 2018, hypertension and hyperlipidemia along with palpitations.  On last office visit on 01/17/2021 she was recovering from pneumonia and continued to have some chest discomfort.  She also had some complaints of muscle spasms and was treated with Flexeril and subsequently Zanaflex by PCP, but she was unable to tolerate it due to significant dizziness. She was able to cut her dose in half which helped with her side effect symptoms.  It was also noted on the last office visit that she had a carotid bruit, left greater than right.  Consideration for carotid Doppler studies will be discussed at this visit.  She comes today generally feeling well.  She is very active in her house redecorating her bathroom.  She is not as physically active as she wants to be and would like to begin a walking program when the weather is better or join the [facility].  She also has some complaints of insomnia and restless leg syndrome.  She is medically compliant denies any chest pain, but does feel a little pressure at the base of her throat on occasion.  "Feels like my heart is in my throat" this is not elicited by any activity, and resolves on its own.  There is no other associated symptoms.  She is in good spirits..   Home Medications    Current Outpatient Medications  Medication Sig Dispense Refill   aspirin 81  MG tablet Take 81 mg by mouth daily.     conjugated estrogens (PREMARIN) vaginal cream Apply vaginally daily x 2 weeks then twice weekly thereafter 42.5 g 12   ferrous sulfate 325 (65 FE) MG tablet TAKE 1 TABLET BY MOUTH EVERY DAY WITH BREAKFAST 90 tablet 1   fluticasone (FLONASE) 50 MCG/ACT nasal spray Place 1 spray into both nostrils daily. 9.9 mL 1   gabapentin (NEURONTIN) 100 MG capsule Take 1 capsule (100 mg total) by mouth 2 (two) times daily. 180 capsule 0   sertraline  (ZOLOFT) 100 MG tablet TAKE 1 TABLET BY MOUTH EVERY DAY 90 tablet 0   tiZANidine (ZANAFLEX) 4 MG tablet Take 1 tablet (4 mg total) by mouth every 8 (eight) hours as needed for muscle spasms. 30 tablet 0   amLODipine (NORVASC) 5 MG tablet Take 1 tablet (5 mg total) by mouth daily. 90 tablet 3   atorvastatin (LIPITOR) 80 MG tablet Take 1 tablet (80 mg total) by mouth daily. 90 tablet 3   ezetimibe (ZETIA) 10 MG tablet Take 1 tablet (10 mg total) by mouth daily. 90 tablet 3   metoprolol tartrate (LOPRESSOR) 25 MG tablet Take 0.5 tablets (12.5 mg total) by mouth 2 (two) times daily. 90 tablet 3   nitroGLYCERIN (NITROSTAT) 0.4 MG SL tablet Place 1 tablet (0.4 mg total) under the tongue every 5 (five) minutes as needed for chest pain. 25 tablet 7   pantoprazole (PROTONIX) 20 MG tablet Take 2 tablets (40 mg total) by mouth 2 (two) times daily. 360 tablet 0   No current facility-administered medications for this visit.     Family History    Family History  Problem Relation Age of Onset   Heart disease Mother        CHF   Diabetes Mother    Hypertension Mother    Cancer Father    Hypertension Sister    Pancreatitis Sister    Dementia Sister    Esophageal cancer Sister    Esophageal cancer Brother    Colon cancer Neg Hx    Stomach cancer Neg Hx    Rectal cancer Neg Hx    She indicated that her mother is alive. She indicated that her father is deceased. She indicated that her sister is alive. She indicated that the status of her brother is unknown. She indicated that all of her three daughters are alive. She indicated that the status of her neg hx is unknown.  Social History    Social History   Socioeconomic History   Marital status: Married    Spouse name: Adam   Number of children: 3   Years of education: 12   Highest education level: High school graduate  Occupational History    Comment: Retired  Tobacco Use   Smoking status: Former    Packs/day: 0.50    Years: 20.00    Pack  years: 10.00    Types: Cigarettes    Quit date: 07/30/2004    Years since quitting: 16.7   Smokeless tobacco: Never  Vaping Use   Vaping Use: Never used  Substance and Sexual Activity   Alcohol use: Yes    Alcohol/week: 0.0 standard drinks    Comment: Occasional   Drug use: No   Sexual activity: Yes    Partners: Male    Birth control/protection: Post-menopausal  Other Topics Concern   Not on file  Social History Narrative   Patient lives in [place] with her husband and niece.  Patient has 3 daughters she is close with. One lives right next door.    Patient enjoys sewing, cooking, reading, and outdoor activities.    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    Social Determinants of Health   Financial Resource Strain: Not on file  Food Insecurity: Not on file  Transportation Needs: Not on file  Physical Activity: Not on file  Stress: Not on file  Social Connections: Not on file  Intimate Partner Violence: Not on file     Review of Systems    General:  No chills, fever, night sweats or weight changes.  Cardiovascular:  No chest pain, dyspnea on exertion, edema, orthopnea, palpitations, paroxysmal nocturnal dyspnea. Dermatological: No rash, lesions/masses Respiratory: No cough, dyspnea Urologic: No hematuria, dysuria Abdominal:   No nausea, vomiting, diarrhea, bright red blood per rectum, melena, or hematemesis Neurologic:  No visual changes, wkns, changes in mental status. All other systems reviewed and are otherwise negative except as noted above.     Physical Exam    VS:  BP 132/68    Pulse 60    Ht 5\' 2"  (1.575 m)    Wt 184 lb 12.8 oz (83.8 kg)    SpO2 96%    BMI 33.80 kg/m  , BMI Body mass index is 33.8 kg/m.     GEN: Well nourished, well developed, in no acute distress. HEENT: normal. Neck: Supple, no JVD, left carotid bruit,  or masses. Cardiac: RRR, no murmurs, rubs, or gallops. No clubbing, cyanosis, edema.  Radials/DP/PT 2+  and equal bilaterally.  Respiratory:  Respirations regular and unlabored, clear to auscultation bilaterally. GI: Soft, nontender, nondistended, BS + x 4. MS: no deformity or atrophy. Skin: warm and dry, no rash. Neuro:  Strength and sensation are intact. Psych: Normal affect.  Accessory Clinical Findings    ECG personally reviewed by me today- Normal sinus rhythm, rate of 60 bpm. T-wave flattening in the Limb leads,  I just finished,and V4-V6. - No acute changes  Lab Results  Component Value Date   WBC 13.9 (H) 12/15/2020   HGB 12.1 12/15/2020   HCT 38.0 12/15/2020   MCV 70.8 (L) 12/15/2020   PLT 413 (H) 12/15/2020   Lab Results  Component Value Date   CREATININE 0.89 01/17/2021   BUN 12 01/17/2021   NA 143 01/17/2021   K 4.7 01/17/2021   CL 106 01/17/2021   CO2 22 01/17/2021   Lab Results  Component Value Date   ALT 17 01/17/2021   AST 14 01/17/2021   ALKPHOS 81 01/17/2021   BILITOT 0.7 01/17/2021   Lab Results  Component Value Date   CHOL 146 01/17/2021   HDL 61 01/17/2021   LDLCALC 67 01/17/2021   LDLDIRECT 129 (H) 01/01/2010   TRIG 95 01/17/2021   CHOLHDL 2.4 01/17/2021    Lab Results  Component Value Date   HGBA1C 6.1 01/07/2021    Review of Prior Studies: Echocardiogram 11/07/2020 . Left ventricular ejection fraction, by estimation, is 60 to 65%. The  left ventricle has normal function. The left ventricle has no regional  wall motion abnormalities. Left ventricular diastolic parameters were  normal. The average left ventricular  global longitudinal strain is -24.5 %. The global longitudinal strain is  normal.   2. Right ventricular systolic function is normal. The right ventricular  size is normal.   3. The mitral valve is normal in structure. Trivial mitral valve  regurgitation. No evidence of mitral  stenosis.   4. The aortic valve is tricuspid. Aortic valve regurgitation is not  visualized. Mild aortic valve sclerosis is present, with no evidence  of  aortic valve stenosis.   5. The inferior vena cava is normal in size with greater than 50%  respiratory variability, suggesting right atrial    Cardiac Monitor 11/01/2020 Sinus bradycardia, normal sinus rhythm, sinus tachycardia, occasional PAC, brief PAT, rare PVC.  Left Heart Cath 03/01/2017 Mid RCA lesion is 20% stenosed. Ost Ramus lesion is 80% stenosed. A drug-eluting stent was successfully placed using a STENT PROMUS PREM MR 3.0X12. Post intervention, there is a 0% residual stenosis. The left ventricular systolic function is normal. LV end diastolic pressure is normal. The left ventricular ejection fraction is 50-55% by visual estimate. There is no mitral valve regurgitation.  Assessment & Plan   1.  Coronary artery disease: History of drug-eluting stent to the ostial ramus in 2018.  She is no longer on dual antiplatelet therapy, but is taking enteric-coated aspirin daily, and is on secondary prevention with blood pressure control, statin therapy.  I have asked her to become more active and she is motivated to do so.  She will begin a walking program slowly, and then may be when she is stronger will progress to the [facility].  In the meantime, I have told her that it would be good for her to park further away from the store in the parking lot to increase her walking during normal daily activities.  2.  Hyperlipidemia: Remains on atorvastatin 80 mg daily.  She has some complaints of mild "restless legs" when she is trying to go to bed but no frank myalgias.  I have advised her to take 1 magnesium glycinate which she can find over-the-counter, and take 1 hour before bed. LDL wads 67 on 01/17/21  3.  Insomnia: I have advised her to decrease her caffeine intake, especially after 5 PM, good sleep hygiene, possibly try OTC melatonin at 5 mg.  Hopefully with increased activity this will also allow her to sleep better.  4. Hypertension: Currently well controlled. No changes in her regimen.    Current medicines are reviewed at length with the patient today.  I have spent 25 min's  dedicated to the care of this patient on the date of this encounter to include pre-visit review of records, assessment, management and diagnostic testing,with shared decision making. Signed, Phill Myron. West Pugh, ANP, AACC   04/25/2021 2:26 PM    [facility] [address] Office [phone] Fax [phone]  Notice: This dictation was prepared with Dragon dictation along with smaller phrase technology. Any transcriptional errors that result from this process are unintentional and may not be corrected upon review.

## 2021-04-25 ENCOUNTER — Encounter: Payer: Self-pay | Admitting: Adult Health

## 2021-04-25 ENCOUNTER — Other Ambulatory Visit: Payer: Self-pay | Admitting: Family Medicine

## 2021-04-25 ENCOUNTER — Ambulatory Visit (INDEPENDENT_AMBULATORY_CARE_PROVIDER_SITE_OTHER): Payer: Medicare Other | Admitting: Adult Health

## 2021-04-25 ENCOUNTER — Other Ambulatory Visit: Payer: Self-pay

## 2021-04-25 VITALS — BP 132/68 | HR 60 | Ht 62.0 in | Wt 184.8 lb

## 2021-04-25 DIAGNOSIS — I10 Essential (primary) hypertension: Secondary | ICD-10-CM | POA: Diagnosis not present

## 2021-04-25 DIAGNOSIS — I2510 Atherosclerotic heart disease of native coronary artery without angina pectoris: Secondary | ICD-10-CM | POA: Diagnosis not present

## 2021-04-25 DIAGNOSIS — G479 Sleep disorder, unspecified: Secondary | ICD-10-CM | POA: Diagnosis not present

## 2021-04-25 DIAGNOSIS — E7800 Pure hypercholesterolemia, unspecified: Secondary | ICD-10-CM | POA: Diagnosis not present

## 2021-04-25 DIAGNOSIS — G2581 Restless legs syndrome: Secondary | ICD-10-CM

## 2021-04-25 MED ORDER — AMLODIPINE BESYLATE 5 MG PO TABS: 5.0000 mg | ORAL_TABLET | Freq: Every day | ORAL | 3 refills | Status: DC

## 2021-04-25 MED ORDER — ATORVASTATIN CALCIUM 80 MG PO TABS: 80.0000 mg | ORAL_TABLET | Freq: Every day | ORAL | 3 refills | Status: DC

## 2021-04-25 MED ORDER — METOPROLOL TARTRATE 25 MG PO TABS: 12.5000 mg | ORAL_TABLET | Freq: Two times a day (BID) | ORAL | 3 refills | Status: DC

## 2021-04-25 MED ORDER — EZETIMIBE 10 MG PO TABS: 10.0000 mg | ORAL_TABLET | Freq: Every day | ORAL | 3 refills | Status: DC

## 2021-04-25 NOTE — Patient Instructions (Addendum)
Medication Instructions:  No Changes *If you need a refill on your cardiac medications before your next appointment, please call your pharmacy*   Lab Work: No Labs If you have labs (blood work) drawn today and your tests are completely normal, you will receive your results only by: [place] (if you have MyChart) OR A paper copy in the mail If you have any lab test that is abnormal or we need to change your treatment, we will call you to review the results.   Testing/Procedures: Your physician has requested that you have a carotid duplex. This test is an ultrasound of the carotid arteries in your neck. It looks at blood flow through these arteries that supply the brain with blood. Allow one hour for this exam. There are no restrictions or special instructions.    Follow-Up: [address]. At [facility], you and your health needs are our priority.  As part of our continuing mission to provide you with exceptional heart care, we have created designated Provider Care Teams.  These Care Teams include your primary Cardiologist (physician) and Advanced Practice Providers (APPs -  Physician Assistants and Nurse Practitioners) who all work together to provide you with the care you need, when you need it.  We recommend signing up for the patient portal called "MyChart".  Sign up information is provided on this After Visit Summary.  MyChart is used to connect with patients for Virtual Visits (Telemedicine).  Patients are able to view lab/test results, encounter notes, upcoming appointments, etc.  Non-urgent messages can be sent to your provider as well.   To learn more about what you can do with MyChart, go to [email or web address].    Your next appointment:   1 year(s)  The format for your next appointment:   In Person  Provider:   [name], MD

## 2021-04-29 ENCOUNTER — Ambulatory Visit (HOSPITAL_COMMUNITY)
Admission: RE | Admit: 2021-04-29 | Discharge: 2021-04-29 | Disposition: A | Discharge status: Home / Self Care | Payer: Medicare Other | Source: Ambulatory Visit | Attending: Cardiovascular Disease | Admitting: Cardiovascular Disease

## 2021-04-29 ENCOUNTER — Other Ambulatory Visit: Payer: Self-pay

## 2021-04-29 DIAGNOSIS — I2510 Atherosclerotic heart disease of native coronary artery without angina pectoris: Secondary | ICD-10-CM | POA: Diagnosis present

## 2021-04-29 DIAGNOSIS — R0989 Other specified symptoms and signs involving the circulatory and respiratory systems: Secondary | ICD-10-CM | POA: Diagnosis not present

## 2021-04-29 DIAGNOSIS — E7800 Pure hypercholesterolemia, unspecified: Secondary | ICD-10-CM | POA: Diagnosis present

## 2021-05-04 ENCOUNTER — Other Ambulatory Visit: Payer: Self-pay | Admitting: Family Medicine

## 2021-05-05 ENCOUNTER — Telehealth: Payer: Self-pay

## 2021-05-05 NOTE — Telephone Encounter (Addendum)
Called patient regarding results. Patient had understanding of results.----- Message from Lendon Colonel, NP sent at 05/01/2021  5:18 PM EST ----- Review of doppler ultrasound results found her blood flow in the carotid arteries to be normal.  The bruit I heard was not from a blockage. Good report.   KL

## 2021-06-09 ENCOUNTER — Telehealth: Payer: Self-pay | Admitting: *Deleted

## 2021-06-09 NOTE — Telephone Encounter (Signed)
Contacted pt to confirm if she is wanting to use the [facility] and she said yes that is where she wants her medications to go.  Placed form back in Dr. Volanda Napoleon box per her request after contacting pt.[name] [name], CMA ? ?

## 2021-06-10 NOTE — Telephone Encounter (Signed)
Completed and put in front office to be faxed. ? ?Thank you

## 2021-06-30 ENCOUNTER — Other Ambulatory Visit: Payer: Self-pay | Admitting: Family Medicine

## 2021-07-09 ENCOUNTER — Ambulatory Visit (INDEPENDENT_AMBULATORY_CARE_PROVIDER_SITE_OTHER): Payer: Medicare Other | Admitting: Family Medicine

## 2021-07-09 ENCOUNTER — Encounter: Payer: Medicare Other | Admitting: Family Medicine

## 2021-07-09 VITALS — BP 128/80 | HR 65 | Temp 98.4°F | Ht 62.0 in | Wt 182.6 lb

## 2021-07-09 DIAGNOSIS — E7800 Pure hypercholesterolemia, unspecified: Secondary | ICD-10-CM

## 2021-07-09 DIAGNOSIS — R7303 Prediabetes: Secondary | ICD-10-CM | POA: Diagnosis not present

## 2021-07-09 DIAGNOSIS — I10 Essential (primary) hypertension: Secondary | ICD-10-CM

## 2021-07-09 DIAGNOSIS — R252 Cramp and spasm: Secondary | ICD-10-CM | POA: Diagnosis not present

## 2021-07-09 DIAGNOSIS — M545 Low back pain, unspecified: Secondary | ICD-10-CM

## 2021-07-09 DIAGNOSIS — G8929 Other chronic pain: Secondary | ICD-10-CM

## 2021-07-09 LAB — POCT UA - MICROALBUMIN
Albumin/Creatinine Ratio, Urine, POC: <30
Creatinine, POC: 300 mg/dL
Microalbumin Ur, POC: 30 mg/L

## 2021-07-09 MED ORDER — POLYETHYLENE GLYCOL 3350 17 GM/SCOOP PO POWD: 17.0000 g | Freq: Two times a day (BID) | ORAL | 1 refills | Status: AC | PRN

## 2021-07-09 MED ORDER — TIZANIDINE HCL 4 MG PO TABS: 4.0000 mg | ORAL_TABLET | Freq: Four times a day (QID) | ORAL | 0 refills | Status: DC | PRN

## 2021-07-09 MED ORDER — ZOSTER VAC RECOMB ADJUVANTED 50 MCG/0.5ML IM SUSR: 0.5000 mL | Freq: Once | INTRAMUSCULAR | 0 refills | Status: AC

## 2021-07-09 NOTE — Progress Notes (Signed)
? ? ?  SUBJECTIVE:  ? ?Chief compliant/HPI: medication management for back spasms ? ?Muscle spasm  ?In the back of her neck of right side  ?Was taking flexeril for this was recommended to stop taking this as frequently  ?She has been using biofreeze to help  ?States she has also tried zanaflex, not sure if it helped as much  ?She requests to start the zanaflex again ? ? ?OBJECTIVE:  ? ?BP 128/80   Pulse 65   Temp 98.4 ?F (36.9 ?C)   Ht '5\' 2"'$  (1.575 m)   Wt 182 lb 9.6 oz (82.8 kg)   SpO2 98%   BMI 33.40 kg/m?   ?Physical Exam ?Constitutional:   ?   General: She is not in acute distress. ?   Appearance: Normal appearance. She is obese. She is not ill-appearing.  ?Cardiovascular:  ?   Rate and Rhythm: Normal rate.  ?Pulmonary:  ?   Effort: Pulmonary effort is normal.  ?   Breath sounds: Normal breath sounds.  ?Musculoskeletal:  ?   Cervical back: No swelling, edema, deformity, erythema, bony tenderness or crepitus.  ?   Thoracic back: Normal.  ?   Lumbar back: Spasms and tenderness present. No bony tenderness. Negative right straight leg raise test and negative left straight leg raise test.  ?   Comments: Lumbar pain with movement bilateral paraspinal muscle   ?Neurological:  ?   Mental Status: She is alert and oriented to person, place, and time.  ? ? ?ASSESSMENT/PLAN:  ? ?Low back pain ?Refilled zanaflex  ?Checked magnesium  ? ?Cramps, muscle, general ?Will check BMP and Mg today  ? ?Prediabetes ?Will check urine microalbumin today  ? ?HYPERCHOLESTEROLEMIA ?Will check lipid panel  ?  ?.  ?Cervical cancer screening:  has had partial hysterectomy  ?Breast cancer screening:  has one scheduled for this afternoon  ? ?Vaccinations recommended for second dose of shingles, printed script.  ? ? ? ?Eulis Foster, MD ?[place]  ? ?

## 2021-07-09 NOTE — Patient Instructions (Signed)
I have submitted refill for your zanaflex.  ? ?We will collect lab work today and I will follow up with any abnormal results.  ? ?Please follow up with Dr. Volanda Napoleon for a foot exam in one month.  ? ?Please continue to follow up with sports medicine for your shoulder.  ?

## 2021-07-10 LAB — LIPID PANEL
Chol/HDL Ratio: 2.8 ratio (ref 0.0–4.4)
Cholesterol, Total: 164 mg/dL (ref 100–199)
HDL: 58 mg/dL (ref >39)
LDL Chol Calc (NIH): 89 mg/dL (ref 0–99)
Triglycerides: 94 mg/dL (ref 0–149)
VLDL Cholesterol Cal: 17 mg/dL (ref 5–40)

## 2021-07-10 LAB — BASIC METABOLIC PANEL
BUN/Creatinine Ratio: 13 (ref 12–28)
BUN: 13 mg/dL (ref 8–27)
CO2: 24 mmol/L (ref 20–29)
Calcium: 9.6 mg/dL (ref 8.7–10.3)
Chloride: 106 mmol/L (ref 96–106)
Creatinine, Ser: 0.98 mg/dL (ref 0.57–1.00)
Glucose: 116 mg/dL — ABNORMAL HIGH (ref 70–99)
Potassium: 4.1 mmol/L (ref 3.5–5.2)
Sodium: 142 mmol/L (ref 134–144)
eGFR: 61 mL/min/{1.73_m2} (ref >59)

## 2021-07-10 LAB — MAGNESIUM: Magnesium: 2.1 mg/dL (ref 1.6–2.3)

## 2021-07-13 NOTE — Assessment & Plan Note (Addendum)
Will check urine microalbumin today  ?

## 2021-07-13 NOTE — Assessment & Plan Note (Addendum)
Refilled zanaflex  ?Checked magnesium  ?

## 2021-07-13 NOTE — Assessment & Plan Note (Signed)
Will check lipid panel. 

## 2021-07-13 NOTE — Assessment & Plan Note (Signed)
Will check BMP and Mg today  ?

## 2021-07-14 ENCOUNTER — Other Ambulatory Visit: Payer: Self-pay | Admitting: Family Medicine

## 2021-07-16 ENCOUNTER — Ambulatory Visit (INDEPENDENT_AMBULATORY_CARE_PROVIDER_SITE_OTHER): Payer: Medicare Other | Admitting: Family Medicine

## 2021-07-16 ENCOUNTER — Ambulatory Visit: Payer: Self-pay

## 2021-07-16 VITALS — BP 154/57 | Ht 62.0 in | Wt 180.0 lb

## 2021-07-16 DIAGNOSIS — M25511 Pain in right shoulder: Secondary | ICD-10-CM

## 2021-07-16 NOTE — Patient Instructions (Signed)
You have rotator cuff impingement with bursitis. ?Try voltaren gel up to 4 times a day in addition to your icy blue, tylenol. ?Wait about a week then start the home exercises as directed. ?Let me know if you're not improving and we can go ahead with a subacromial injection - just give [place] a call if you want to do this. ?Follow up with me as needed if you continue to improve. ?

## 2021-07-18 ENCOUNTER — Encounter: Payer: Self-pay | Admitting: Family Medicine

## 2021-07-18 NOTE — Progress Notes (Signed)
PCP: Carollee Leitz, MD ? ?Subjective:  ? ?HPI: ?Patient is a 71 y.o. female here for right shoulder pain. ? ?Patient reports she's had lateral right shoulder pain for 2-3 weeks. ?Started after she began taking down wallpaper in her house. ?No acute injury or trauma. ?Pain worse when she reaches back or overhead. ?+ night pain. ?Some mild improvement the past couple days. ?She has been using icy blue and biofreeze patches. ?She is right handed. ? ?Past Medical History:  ?Diagnosis Date  ? Arthritis   ? Blood transfusion without reported diagnosis   ? Cataract   ? Coronary artery disease   ? Depression   ? Hyperlipidemia   ? Hypertension   ? Microcytic anemia   ? Mitral valve prolapse   ? ? ?Current Outpatient Medications on File Prior to Visit  ?Medication Sig Dispense Refill  ? amLODipine (NORVASC) 5 MG tablet Take 1 tablet (5 mg total) by mouth daily. 90 tablet 3  ? aspirin 81 MG tablet Take 81 mg by mouth daily.    ? atorvastatin (LIPITOR) 80 MG tablet Take 1 tablet (80 mg total) by mouth daily. 90 tablet 3  ? conjugated estrogens (PREMARIN) vaginal cream Apply vaginally daily x 2 weeks then twice weekly thereafter 42.5 g 12  ? ezetimibe (ZETIA) 10 MG tablet Take 1 tablet (10 mg total) by mouth daily. 90 tablet 3  ? ferrous sulfate 325 (65 FE) MG tablet TAKE 1 TABLET BY MOUTH EVERY DAY WITH BREAKFAST 90 tablet 1  ? fluticasone (FLONASE) 50 MCG/ACT nasal spray Place 1 spray into both nostrils daily. 9.9 mL 1  ? gabapentin (NEURONTIN) 100 MG capsule TAKE ONE CAPSULE BY MOUTH TWICE DAILY @ 9AM & 5PM 120 capsule 1  ? metoprolol tartrate (LOPRESSOR) 25 MG tablet Take 0.5 tablets (12.5 mg total) by mouth 2 (two) times daily. 90 tablet 3  ? nitroGLYCERIN (NITROSTAT) 0.4 MG SL tablet DISSOLVE 1 TABLET UNDER THE TONGUE EVERY 5 MINUTES AS NEEDED FOR CHEST PAIN. DO NOT EXCEED A TOTAL OF 3 DOSES IN 15 MINUTES. (ORIG) 25 tablet 1  ? pantoprazole (PROTONIX) 20 MG tablet Take 2 tablets (40 mg total) by mouth 2 (two) times daily.  360 tablet 0  ? polyethylene glycol powder (GLYCOLAX/MIRALAX) 17 GM/SCOOP powder Take 17 g by mouth 2 (two) times daily as needed. 3350 g 1  ? sertraline (ZOLOFT) 100 MG tablet TAKE ONE TABLET BY MOUTH DAILY AT 9 AM 90 tablet 11  ? tiZANidine (ZANAFLEX) 4 MG tablet Take 1 tablet (4 mg total) by mouth every 6 (six) hours as needed for muscle spasms. 30 tablet 0  ? ?No current facility-administered medications on file prior to visit.  ? ? ?Past Surgical History:  ?Procedure Laterality Date  ? ABDOMINAL HYSTERECTOMY    ? CARDIAC CATHETERIZATION    ? CATARACT EXTRACTION, BILATERAL    ? COLONOSCOPY    ? greater than 10 years ago  ? CORONARY STENT INTERVENTION N/A 03/01/2017  ? Procedure: CORONARY STENT INTERVENTION;  Surgeon: Burnell Blanks, MD;  Location: [place] [facility];  Service: Cardiovascular;  Laterality: N/A;  ? INTRAVASCULAR PRESSURE WIRE/FFR STUDY N/A 03/01/2017  ? Procedure: INTRAVASCULAR PRESSURE WIRE/FFR STUDY;  Surgeon: Burnell Blanks, MD;  Location: [place] [facility];  Service: Cardiovascular;  Laterality: N/A;  ? LEFT HEART CATH AND CORONARY ANGIOGRAPHY N/A 03/01/2017  ? Procedure: LEFT HEART CATH AND CORONARY ANGIOGRAPHY;  Surgeon: Burnell Blanks, MD;  Location: [place] [facility];  Service: Cardiovascular;  Laterality: N/A;  ? PARTIAL HYSTERECTOMY    ? ? ?No Known Allergies ? ?BP (!) 154/57   Ht '5\' 2"'$  (1.575 m)   Wt 180 lb (81.6 kg)   BMI 32.92 kg/m?  ? ?   ? View : No data to display.  ?  ?  ?  ? ? ?   ? View : No data to display.  ?  ?  ?  ? ? ?    ?Objective:  ?Physical Exam: ? ?Gen: NAD, comfortable in exam room ? ?Right shoulder: ?No swelling, ecchymoses.  No gross deformity. ?No TTP AC joint, biceps tendon. ?FROM with painful arc. ?Positive Hawkins, Neers. ?Negative Yergasons. ?Strength 5/5 with empty can and resisted internal/external rotation.  Pain empty can. ?NV intact distally. ? ?Limited MSK u/s right shoulder: ?Subscapularis: intact with mild overlying  bursitis ?Infraspinatus: intact without abnormalities ?Supraspinatus: no visible tears.  Overlying bursitis.  Small insertional calcification bursal side. ? ?Impression: Subacromial bursitis ?  ?Assessment & Plan:  ?1. Right shoulder pain - 2/2 rotator cuff impingement with bursitis. Discussed options.  She will consider subacromial injection.  Voltaren gel, other topical medicines reviewed.  Icing.  Hom exercises reviewed to start in about a week.  F/u prn if she is improving. ?

## 2021-07-20 ENCOUNTER — Other Ambulatory Visit: Payer: Self-pay | Admitting: Family Medicine

## 2021-07-30 ENCOUNTER — Encounter: Payer: Self-pay | Admitting: Family Medicine

## 2021-08-04 ENCOUNTER — Encounter: Payer: Self-pay | Admitting: Family Medicine

## 2021-08-04 ENCOUNTER — Ambulatory Visit (INDEPENDENT_AMBULATORY_CARE_PROVIDER_SITE_OTHER): Payer: Medicare Other | Admitting: Family Medicine

## 2021-08-04 VITALS — BP 112/58 | HR 62 | Ht 62.0 in | Wt 184.2 lb

## 2021-08-04 DIAGNOSIS — R7303 Prediabetes: Secondary | ICD-10-CM | POA: Diagnosis not present

## 2021-08-04 DIAGNOSIS — F32A Depression, unspecified: Secondary | ICD-10-CM

## 2021-08-04 DIAGNOSIS — F419 Anxiety disorder, unspecified: Secondary | ICD-10-CM

## 2021-08-04 DIAGNOSIS — L608 Other nail disorders: Secondary | ICD-10-CM

## 2021-08-04 LAB — POCT GLYCOSYLATED HEMOGLOBIN (HGB A1C): HbA1c, POC (prediabetic range): 6.3 % (ref 5.7–6.4)

## 2021-08-04 MED ORDER — PANTOPRAZOLE SODIUM 20 MG PO TBEC: 40.0000 mg | DELAYED_RELEASE_TABLET | Freq: Every day | ORAL | 0 refills | Status: DC

## 2021-08-04 MED ORDER — SERTRALINE HCL 100 MG PO TABS: 50.0000 mg | ORAL_TABLET | Freq: Every day | ORAL | 3 refills | Status: DC

## 2021-08-04 NOTE — Patient Instructions (Addendum)
Thank you for coming to see me today. It was a pleasure.  ? ?Recommend Shingles vaccine.  This is a 2 dose series and can be given at your local pharmacy.  Please talk to your pharmacist about this. ? ?You are due for an eye exam.  Please make sure that you call your eye doctor to have this scheduled and have them fax the results to our office. ? ?Decrease Protonix to 20 mg daily ?Decrease Zoloft to 50 mg daily ? ?Please follow-up with PCP in 2 weeks ? ?If you have any questions or concerns, please do not hesitate to call the office at [phone]. ? ?Best,  ? ?Carollee Leitz, MD   ?

## 2021-08-04 NOTE — Progress Notes (Signed)
    SUBJECTIVE:   CHIEF COMPLAINT / HPI: Diabetic foot exam  Patient presents clinic for A1c and diabetic foot exam.  Denies any headaches, visual changes, chest pain, lower extremity edema, numbness, tingling, weakness, ulcers, infections or open areas on skin of lower extremities.  She reports she has never been diagnosed with diabetes always been prediabetic range.  GERD At last visit decrease Protonix from 40 mg twice daily to 20 twice daily.  Has been tolerating medication well without any abdominal pain.   Mood disorder Patient requesting to decrease Zoloft.  Reports mood has been good, no tearfulness, no suicidal ideation or homicidal ideation.  Sleeping well, eating well.  Right great toe discoloration Has been there for years.  No pain, itching, trauma or previous injury.    PERTINENT  PMH / PSH:  Prediabetes Hypertension GERD Hyperlipidemia Mood disorder Peripheral neuropathy  OBJECTIVE:   BP (!) 112/58   Pulse 62   Ht '5\' 2"'$  (1.575 m)   Wt 184 lb 3.2 oz (83.6 kg)   SpO2 99%   BMI 33.69 kg/m    General: Alert, no acute distress Cardio: Normal S1 and S2, RRR, no r/m/g Pulm: CTAB, normal work of breathing Abdomen: Bowel sounds normal. Abdomen soft and non-tender.  Extremities: No peripheral edema.  Diabetic foot exam: No deformities, ulcerations, or other skin breakdown on feet bilaterally.  Sensation intact to monofilament and light touch.  PT and DP pulses intact BL.  Right hallux nail:      ASSESSMENT/PLAN:   Prediabetes A1c 6.3 today Diabetic foot exam not indicated given no history of diabetes. Repeat A1c in 1 year Follow-up with PCP as needed  Anxiety and depression PHQ-9 remains essentially unchanged.  Patient requesting decrease in medication. Decrease Zoloft to 50 mg daily Follow up in 1 week Strict return precautions provided  Nail discoloration Right great hallux nail thickening and discolored for number of years.  Non tender and no  previous trauma.  Differentials include fungal melanochyia, subungal hematoma, malignancy.   Will refer to Podiatry for further evaluation     Carollee Leitz, MD [place]

## 2021-08-05 ENCOUNTER — Encounter: Payer: Self-pay | Admitting: Family Medicine

## 2021-08-05 ENCOUNTER — Telehealth: Payer: Self-pay

## 2021-08-05 DIAGNOSIS — L608 Other nail disorders: Secondary | ICD-10-CM | POA: Insufficient documentation

## 2021-08-05 MED ORDER — TIZANIDINE HCL 4 MG PO TABS: 4.0000 mg | ORAL_TABLET | Freq: Four times a day (QID) | ORAL | 0 refills | Status: DC | PRN

## 2021-08-05 NOTE — Telephone Encounter (Signed)
Refill completed. Thank you

## 2021-08-05 NOTE — Assessment & Plan Note (Signed)
A1c 6.3 today ?Diabetic foot exam not indicated given no history of diabetes. ?Repeat A1c in 1 year ?Follow-up with PCP as needed ?

## 2021-08-05 NOTE — Telephone Encounter (Signed)
Patient calls nurse line reporting she was seen in office yesterday.  ? ?Patient reports all medications were sent to her pharmacy except for Tizanidine.  ? ?Will forward to PCP for advisement.  ?

## 2021-08-05 NOTE — Assessment & Plan Note (Addendum)
PHQ-9 remains essentially unchanged.  Patient requesting decrease in medication. ?Decrease Zoloft to 50 mg daily ?Follow up in 1 week ?Strict return precautions provided ?

## 2021-08-05 NOTE — Assessment & Plan Note (Signed)
Right great hallux nail thickening and discolored for number of years.  Non tender and no previous trauma.  Differentials include fungal melanochyia, subungal hematoma, malignancy.   ?Will refer to Podiatry for further evaluation ?

## 2021-08-18 ENCOUNTER — Telehealth (INDEPENDENT_AMBULATORY_CARE_PROVIDER_SITE_OTHER): Payer: Medicare Other | Admitting: Family Medicine

## 2021-08-18 DIAGNOSIS — F419 Anxiety disorder, unspecified: Secondary | ICD-10-CM

## 2021-08-18 DIAGNOSIS — F32A Depression, unspecified: Secondary | ICD-10-CM | POA: Diagnosis not present

## 2021-08-18 NOTE — Progress Notes (Unsigned)
[place] Telemedicine Visit  Patient consented to have virtual visit and was identified by name and date of birth. Method of visit: {TELEPHONE VS ENMMH:68088}  Encounter participants: Patient: [name] [name] - located at *** Provider: Carollee Leitz - located at *** Others (if applicable): ***  Chief Complaint: ***  HPI:  ***  ROS: per HPI  Pertinent PMHx: ***  Exam:  There were no vitals taken for this visit.  Respiratory: ***  Assessment/Plan:  No problem-specific Assessment & Plan notes found for this encounter.    Time spent during visit with patient: 10 minutes

## 2021-08-20 ENCOUNTER — Encounter: Payer: Self-pay | Admitting: Family Medicine

## 2021-08-20 NOTE — Assessment & Plan Note (Signed)
PHQ improving Will continue current dose Zoloft 50 mg daily Wean as tolerated Follow up as needed Strict return precautions provided

## 2021-09-02 ENCOUNTER — Encounter: Payer: Self-pay | Admitting: *Deleted

## 2021-09-02 ENCOUNTER — Ambulatory Visit: Payer: Medicare Other | Admitting: Podiatry

## 2021-09-02 DIAGNOSIS — L819 Disorder of pigmentation, unspecified: Secondary | ICD-10-CM

## 2021-09-02 DIAGNOSIS — B351 Tinea unguium: Secondary | ICD-10-CM

## 2021-09-02 DIAGNOSIS — L853 Xerosis cutis: Secondary | ICD-10-CM

## 2021-09-09 ENCOUNTER — Other Ambulatory Visit: Payer: Self-pay | Admitting: Family Medicine

## 2021-09-09 ENCOUNTER — Telehealth: Payer: Self-pay | Admitting: Podiatry

## 2021-09-09 NOTE — Telephone Encounter (Signed)
Pt was scheduled to see you tomorrow @ 345 but is canceling and would like to reschedule. Where would you like me to put her? Did you want another Wednesday?

## 2021-09-09 NOTE — Telephone Encounter (Signed)
Left message for pt to call to reschedule the appt that was canceled the next Wednesday afternoon would be 6.28 or we could look into July.

## 2021-09-09 NOTE — Progress Notes (Signed)
Subjective:   Patient ID: [name] [name], female   DOB: 71 y.o.   MRN: [id number]   HPI 71 year old female presents the office today for concerns of right big toenail becoming thick and discolored and spent ongoing for greater than 10 years.  She also has dryness to her heels.  No open lesions.  No swelling redness or any drainage.   Review of Systems  All other systems reviewed and are negative.  Past Medical History:  Diagnosis Date   Arthritis    Blood transfusion without reported diagnosis    Cataract    Coronary artery disease    Depression    Hyperlipidemia    Hypertension    Microcytic anemia    Mitral valve prolapse     Past Surgical History:  Procedure Laterality Date   ABDOMINAL HYSTERECTOMY     CARDIAC CATHETERIZATION     CATARACT EXTRACTION, BILATERAL     COLONOSCOPY     greater than 10 years ago   CORONARY STENT INTERVENTION N/A 03/01/2017   Procedure: CORONARY STENT INTERVENTION;  Surgeon: Burnell Blanks, MD;  Location: [facility];  Service: Cardiovascular;  Laterality: N/A;   INTRAVASCULAR PRESSURE WIRE/FFR STUDY N/A 03/01/2017   Procedure: INTRAVASCULAR PRESSURE WIRE/FFR STUDY;  Surgeon: Burnell Blanks, MD;  Location: [facility];  Service: Cardiovascular;  Laterality: N/A;   LEFT HEART CATH AND CORONARY ANGIOGRAPHY N/A 03/01/2017   Procedure: LEFT HEART CATH AND CORONARY ANGIOGRAPHY;  Surgeon: Burnell Blanks, MD;  Location: [facility];  Service: Cardiovascular;  Laterality: N/A;   PARTIAL HYSTERECTOMY       Current Outpatient Medications:    amLODipine (NORVASC) 5 MG tablet, Take 1 tablet (5 mg total) by mouth daily., Disp: 90 tablet, Rfl: 3   aspirin 81 MG tablet, Take 81 mg by mouth daily., Disp: , Rfl:    atorvastatin (LIPITOR) 80 MG tablet, Take 1 tablet (80 mg total) by mouth daily., Disp: 90 tablet, Rfl: 3   conjugated estrogens (PREMARIN) vaginal cream, Apply vaginally daily x 2 weeks then twice  weekly thereafter, Disp: 42.5 g, Rfl: 12   ezetimibe (ZETIA) 10 MG tablet, Take 1 tablet (10 mg total) by mouth daily., Disp: 90 tablet, Rfl: 3   ferrous sulfate 325 (65 FE) MG tablet, TAKE 1 TABLET BY MOUTH EVERY DAY WITH BREAKFAST, Disp: 90 tablet, Rfl: 1   fluticasone (FLONASE) 50 MCG/ACT nasal spray, USE 1 SPRAY IN EACH NOSTRIL DAILY (BULK), Disp: 16 mL, Rfl: 0   gabapentin (NEURONTIN) 100 MG capsule, TAKE ONE CAPSULE BY MOUTH TWICE DAILY @ 9AM & 5PM, Disp: 120 capsule, Rfl: 1   metoprolol tartrate (LOPRESSOR) 25 MG tablet, Take 0.5 tablets (12.5 mg total) by mouth 2 (two) times daily., Disp: 90 tablet, Rfl: 3   nitroGLYCERIN (NITROSTAT) 0.4 MG SL tablet, DISSOLVE 1 TABLET UNDER THE TONGUE EVERY 5 MINUTES AS NEEDED FOR CHEST PAIN. DO NOT EXCEED A TOTAL OF 3 DOSES IN 15 MINUTES. (ORIG), Disp: 25 tablet, Rfl: 1   pantoprazole (PROTONIX) 20 MG tablet, Take 2 tablets (40 mg total) by mouth daily., Disp: 180 tablet, Rfl: 0   polyethylene glycol powder (GLYCOLAX/MIRALAX) 17 GM/SCOOP powder, Take 17 g by mouth 2 (two) times daily as needed., Disp: 3350 g, Rfl: 1   sertraline (ZOLOFT) 100 MG tablet, Take 0.5 tablets (50 mg total) by mouth daily., Disp: 90 tablet, Rfl: 3   tiZANidine (ZANAFLEX) 4 MG tablet, Take 1 tablet (4 mg total) by mouth  every 6 (six) hours as needed for muscle spasms., Disp: 30 tablet, Rfl: 0  No Known Allergies        Objective:  Physical Exam  General: AAO x3, NAD  Dermatological: Right hallux nail is hypertrophic, dystrophic and darkened discoloration noted.  There is no extensively hyperpigmentation in the surrounding skin.  No edema, erythema, drainage or pus or signs of infection.  Dry skin present with any skin openings otherwise.  Vascular: Dorsalis Pedis artery and Posterior Tibial artery pedal pulses are 2/4 bilateral with immedate capillary fill time.  There is no pain with calf compression, swelling, warmth, erythema.   Neruologic: Grossly intact via light  touch bilateral.   Musculoskeletal: Muscular strength 5/5 in all groups tested bilateral.  Gait: Unassisted, Nonantalgic.       Assessment:   Onychodystrophy, discoloration hallux toenail; dry skin     Plan:  -Treatment options discussed including all alternatives, risks, and complications -Etiology of symptoms were discussed -Ultimately for the hallux toenail discussed biopsy of the toenail.  I do think it be beneficial to remove the toenail biopsy of the toenail.  After discussion she wants to proceed this and we will reappoint her to get this done at her convenience. -For the dry skin on the heel discussed urea.  Trula Slade DPM

## 2021-09-10 ENCOUNTER — Ambulatory Visit: Payer: Medicare Other | Admitting: Podiatry

## 2021-09-27 ENCOUNTER — Ambulatory Visit (INDEPENDENT_AMBULATORY_CARE_PROVIDER_SITE_OTHER): Payer: Medicare Other | Admitting: Podiatry

## 2021-09-27 DIAGNOSIS — L819 Disorder of pigmentation, unspecified: Secondary | ICD-10-CM

## 2021-09-27 DIAGNOSIS — B351 Tinea unguium: Secondary | ICD-10-CM | POA: Diagnosis not present

## 2021-09-27 MED ORDER — AMMONIUM LACTATE 12 % EX LOTN: 1.0000 | TOPICAL_LOTION | CUTANEOUS | 0 refills | Status: DC | PRN

## 2021-09-27 NOTE — Patient Instructions (Signed)

## 2021-09-27 NOTE — Progress Notes (Signed)
Subjective: 71 year old female presents the office today to have the right big toenail removed.  She states it is still thick and discolored and she denies any recent changes since she was last seen.  No swelling or redness or any drainage.  She gets dry skin to her heels and asking about a prescription moisturizer.  Objective: AAO x3, NAD DP/PT pulses palpable bilaterally, CRT less than 3 seconds The right hallux nail is hypertrophic, dystrophic with darkened discoloration.  No extensively hyperpigmentation in the surrounding skin.  There is no drainage or pus. Dry skin present the heels without any skin breakdown, skin fissures or any drainage. No pain with calf compression, swelling, warmth, erythema  Assessment: Onychodystrophy, onychomycosis; dry skin  Plan: -All treatment options discussed with the patient including all alternatives, risks, complications.  -Prescribed AmLactin for dry skin -Again discussed nail removal versus conservative treatment.  We recommend biopsy anyway.  She wants to proceed with nail removal. -At this time, recommended total nail removal without chemical matricectomy right hallux toenail.  Risks and complications were discussed with the patient for which they understand and  verbally consent to the procedure. Under sterile conditions a total of 3 mL of a mixture of 2% lidocaine plain and 0.5% Marcaine plain was infiltrated in a hallux block fashion. Once anesthetized, the skin was prepped in sterile fashion. A tourniquet was then applied.  This time the right hallux nail was removed in total.  Found to be brittle with subungual debris.  Once the nail was removed, the area was debrided and the underlying skin was intact.  No purulence or signs of infection.  The area was irrigated and hemostasis was obtained.  A dry sterile dressing was applied. After application of the dressing the tourniquet was removed and there is found to be an immediate capillary refill time to  the digit. The patient tolerated the procedure well any complications. Post procedure instructions were discussed the patient for which he verbally understood.  Discussed signs/symptoms of worsening infection and directed to call the office immediately should any occur or go directly to the emergency room. In the meantime, encouraged to call the office with any questions, concerns, changes symptoms. -Patient encouraged to call the office with any questions, concerns, change in symptoms.   Trula Slade DPM

## 2021-09-29 ENCOUNTER — Other Ambulatory Visit: Payer: Self-pay

## 2021-09-29 MED ORDER — GABAPENTIN 100 MG PO CAPS
ORAL_CAPSULE | ORAL | 1 refills | Status: DC
Start: 2021-09-29 — End: 2022-01-21

## 2021-10-02 ENCOUNTER — Other Ambulatory Visit: Payer: Self-pay | Admitting: Cardiology

## 2021-10-02 ENCOUNTER — Telehealth: Payer: Self-pay | Admitting: Podiatry

## 2021-10-02 NOTE — Telephone Encounter (Signed)
Pt states she had her toe nail removed on 7/1 and she has been doing the foot soaks and wrapping it up, however it now has some pus drainage coming from it. Pt is concerned if this is infection and if so, can an antibiotic be called in for her?  [id number] [facility] [address]  Please advise

## 2021-10-17 ENCOUNTER — Ambulatory Visit (INDEPENDENT_AMBULATORY_CARE_PROVIDER_SITE_OTHER): Payer: Medicare Other | Admitting: Podiatry

## 2021-10-17 DIAGNOSIS — I739 Peripheral vascular disease, unspecified: Secondary | ICD-10-CM

## 2021-10-17 DIAGNOSIS — M722 Plantar fascial fibromatosis: Secondary | ICD-10-CM

## 2021-10-17 DIAGNOSIS — B351 Tinea unguium: Secondary | ICD-10-CM

## 2021-10-17 MED ORDER — CICLOPIROX 8 % EX SOLN: Freq: Every day | CUTANEOUS | 2 refills | Status: DC

## 2021-10-17 NOTE — Progress Notes (Signed)
Subjective: Chief Complaint  Patient presents with   Ingrown Toenail    Nail check No pain, Just peeling Tx; still soaking    Subjective: 71 year old female presents the office today status post right big toenail removal.  She states she had some peeling skin but otherwise been doing well.  No swelling redness or drainage.  Objective: AAO x3, NAD DP/PT pulses palpable bilaterally, CRT less than 3 seconds Status post right total nail removal.  Nailbed appears to be healed there are some peeling, dry skin but there is no open lesions.  No drainage or pus.  No fluctuance or crepitation. No pain with calf compression, swelling, warmth, erythema  Assessment: Status post nail removal  Plan: -All treatment options discussed with the patient including all alternatives, risks, complications.  -Continue washing with soap and water and apply antibiotic ointment and a Band-Aid during the day but can leave the area open at nighttime.  Monitor for any signs or symptoms of infection -Awaiting nail culture result -Patient encouraged to call the office with any questions, concerns, change in symptoms.   Vivi Barrack DPM

## 2021-10-21 ENCOUNTER — Encounter: Payer: Self-pay | Admitting: Podiatry

## 2021-10-30 ENCOUNTER — Other Ambulatory Visit: Payer: Self-pay | Admitting: Family Medicine

## 2021-11-05 ENCOUNTER — Other Ambulatory Visit: Payer: Self-pay | Admitting: Cardiology

## 2021-11-06 ENCOUNTER — Other Ambulatory Visit: Payer: Self-pay | Admitting: Family Medicine

## 2021-11-07 NOTE — Telephone Encounter (Signed)
Refilled Zanaflex for #15. Please schedule visit to follow up with PCP on medical conditions, including spasms.

## 2021-11-21 ENCOUNTER — Other Ambulatory Visit: Payer: Self-pay | Admitting: Family Medicine

## 2021-11-24 NOTE — Patient Instructions (Signed)
It was great to see you! Thank you for allowing me to participate in your care!  I recommend that you always bring your medications to each appointment as this makes it easy to ensure we are on the correct medications and helps [place] not miss when refills are needed.  Our plans for today:  - Diabetes   Checking A1c today  - Side pain  Sounds like a tight muscle, could also be bony pain in back.   Recommend you use  -Heating pads as needed -Voltaren cream to area as needed - 200 mg ibuprofen as needed  We are checking some labs today, I will call you if they are abnormal will send you a MyChart message or a letter if they are normal.  If you do not hear about your labs in the next 2 weeks please let us know.  Take care and seek immediate care sooner if you develop any concerns.   Dr. Holley Bouche, MD [place]

## 2021-11-24 NOTE — Progress Notes (Unsigned)
SUBJECTIVE:   CHIEF COMPLAINT / HPI:   Med Rec: Patient taking Tizanidine and gabapentin, using tizanidine for back spasms and sleep.   HTN: BP Readings from Last 3 Encounters:  11/25/21 (!) 124/59  08/04/21 (!) 112/58  07/16/21 (!) 154/57  Meds: Metoprolol 12.5 BID, Amlodipine 5 Compliance: daily    DM: Prediabetic, last a1c 6.3 08/04/21. A1c 6.5 in 2013 Optho: to have eye exam done   HLD Meds: atorvastatin 80 Lab Results  Component Value Date   CHOL 164 07/09/2021   HDL 58 07/09/2021   LDLCALC 89 07/09/2021   LDLDIRECT 129 (H) 01/01/2010   TRIG 94 07/09/2021   CHOLHDL 2.8 07/09/2021    CAD Seen cardiology 10/11/20. Had echo and halter that were normal. Reports she saw them recently this year.  Anxiety & Depression On zoloft 50 mg daily, seen in 08/18/21 Still taking, seems to be helping.   Healthcare Maintenance Mammogram - normal 07/30/21 repeat in 2 years Colon Cancer - Colonoscopy 07/09/20 tubular adenoma x 3, f/u 3 years  Right Pain in side Started a couple of weeks ago, eases off and comes back. Sharp pain causing her to freeze. Feels like a muscle spasm. Has tried tums because she thought it was gas. Pain will linger for a little bit, but last seconds. Hurts when turning to side or coughing. Located on right side of back, sometimes unable to sleep on side. Pain rated a 2-3 and more of a nuisance, sometimes 5-6. No problem with riding this weekned on motor cycle.     Other Complaints today: Chest congestion Wanting to talk about weight  Swelling in ankle Sleep issues   PERTINENT  PMH / PSH: HTN, CAD,DM    OBJECTIVE:  BP (!) 124/59   Pulse 64   Ht '5\' 2"'$  (1.575 m)   Wt 184 lb 4 oz (83.6 kg)   SpO2 96%   BMI 33.70 kg/m   General: NAD, pleasant, able to participate in exam Cardiac: RRR, no murmurs auscultated. Respiratory: CTAB, normal effort, no wheezes, rales or rhonchi Abdomen: soft, non-tender, non-distended Extremities: warm and well  perfused, no edema or cyanosis. Physical Exam Musculoskeletal:     Thoracic back: Normal. No swelling, edema, deformity, signs of trauma, spasms or tenderness. Normal range of motion.     Lumbar back: Normal. No swelling, edema, deformity, signs of trauma, spasms or tenderness. Normal range of motion.     Comments: Back with full ROM and symmetric bulk, minimal pain in back when crunching over to right side.      ASSESSMENT/PLAN:  Essential hypertension Patient BP well controlled, patient reports good compliance with meds. -Continue metoprolol and amlodipine -BMP  CAD (coronary artery disease) Patient with CAD, last seen cards 10/11/20 per chart review, but reports having seen them this year. Had echo and halter monitor that were both normal 10/11/20. -Continue to f/u yearly or as needed  Anxiety and depression Reports anxiety is going well. No symptoms of depression. Meds seem to be helping, doesn't need change in dose. -50 mg zoloft  HYPERCHOLESTEROLEMIA Continues to take daily atorvastatin. Last lipid panel not at goal, will recheck.  Lab Results  Component Value Date   CHOL 164 07/09/2021   HDL 58 07/09/2021   LDLCALC 89 07/09/2021   LDLDIRECT 129 (H) 01/01/2010   TRIG 94 07/09/2021   CHOLHDL 2.8 07/09/2021  -Continue atorvostatin 80 mg -lipid panel   Prediabetes Patient with hx of prediabetes. Will assess A1c today and determine need for glycemic  control -Hgb A1c  Cramps, muscle, general Pain seems to be 2/2 tight muscle, muscular etiology, given symptoms and hx. May be cramps. Pain is sporadic, lasting short time, more related to positional changes in back. Back muscles with symmetric bulk and ROM. Less likely back spasm given timing of symptoms. Low concern for arthritis given timing of symptoms.  -Heating pad - Voltaren Gel   Med Rec: On med rec patient disclosed they were using tizanidine for sleep. Discouraged patient from using medicine for sleep, she reports  not taking it frequently. Will stop refilling tizanidine.  Patient with numerous other issues, encourage patient to make f/u appointment to discuss other issues.     Meds ordered this encounter  Medications   diclofenac Sodium (VOLTAREN) 1 % topical gel 2 g   No follow-ups on file. '@SIGNNOTE'$ @

## 2021-11-25 ENCOUNTER — Ambulatory Visit (INDEPENDENT_AMBULATORY_CARE_PROVIDER_SITE_OTHER): Payer: Medicare Other | Admitting: Student

## 2021-11-25 ENCOUNTER — Encounter: Payer: Self-pay | Admitting: Student

## 2021-11-25 VITALS — BP 124/59 | HR 64 | Ht 62.0 in | Wt 184.2 lb

## 2021-11-25 DIAGNOSIS — R7303 Prediabetes: Secondary | ICD-10-CM

## 2021-11-25 DIAGNOSIS — R252 Cramp and spasm: Secondary | ICD-10-CM | POA: Diagnosis not present

## 2021-11-25 DIAGNOSIS — I2510 Atherosclerotic heart disease of native coronary artery without angina pectoris: Secondary | ICD-10-CM

## 2021-11-25 DIAGNOSIS — F419 Anxiety disorder, unspecified: Secondary | ICD-10-CM

## 2021-11-25 DIAGNOSIS — I10 Essential (primary) hypertension: Secondary | ICD-10-CM | POA: Diagnosis not present

## 2021-11-25 DIAGNOSIS — F32A Depression, unspecified: Secondary | ICD-10-CM

## 2021-11-25 DIAGNOSIS — E7800 Pure hypercholesterolemia, unspecified: Secondary | ICD-10-CM

## 2021-11-25 LAB — POCT GLYCOSYLATED HEMOGLOBIN (HGB A1C): HbA1c, POC (controlled diabetic range): 6.8 % (ref 0.0–7.0)

## 2021-11-25 MED ORDER — DICLOFENAC SODIUM 1 % EX GEL: 2.0000 g | CUTANEOUS | Status: DC | PRN

## 2021-11-27 NOTE — Assessment & Plan Note (Signed)
Patient with CAD, last seen cards 10/11/20 per chart review, but reports having seen them this year. Had echo and halter monitor that were both normal 10/11/20. -Continue to f/u yearly or as needed

## 2021-11-27 NOTE — Assessment & Plan Note (Signed)
Patient with hx of prediabetes. Will assess A1c today and determine need for glycemic control -Hgb A1c

## 2021-11-27 NOTE — Assessment & Plan Note (Addendum)
Patient BP well controlled 124/59, patient reports good compliance with meds. -Continue metoprolol and amlodipine -BMP

## 2021-11-27 NOTE — Assessment & Plan Note (Signed)
Pain seems to be 2/2 tight muscle, muscular etiology, given symptoms and hx. May be cramps. Pain is sporadic, lasting short time, more related to positional changes in back. Back muscles with symmetric bulk and ROM. Less likely back spasm given timing of symptoms. Low concern for arthritis given timing of symptoms.  -Heating pad - Voltaren Gel

## 2021-11-27 NOTE — Assessment & Plan Note (Signed)
Reports anxiety is going well. No symptoms of depression. Meds seem to be helping, doesn't need change in dose. -50 mg zoloft

## 2021-11-27 NOTE — Assessment & Plan Note (Addendum)
Continues to take daily atorvastatin. Last lipid panel not at goal, will recheck.  Lab Results  Component Value Date   CHOL 164 07/09/2021   HDL 58 07/09/2021   LDLCALC 89 07/09/2021   LDLDIRECT 129 (H) 01/01/2010   TRIG 94 07/09/2021   CHOLHDL 2.8 07/09/2021  -Continue atorvostatin 80 mg -lipid panel

## 2021-12-04 ENCOUNTER — Telehealth: Payer: Self-pay

## 2021-12-04 ENCOUNTER — Other Ambulatory Visit: Payer: Self-pay | Admitting: Student

## 2021-12-04 MED ORDER — OZEMPIC (2 MG/DOSE) 8 MG/3ML ~~LOC~~ SOPN: 0.2500 mg | PEN_INJECTOR | SUBCUTANEOUS | 0 refills | Status: DC

## 2021-12-04 NOTE — Telephone Encounter (Signed)
-----   Message from Holley Bouche, MD sent at 12/04/2021 11:58 AM EDT ----- Regarding: Ozempic training [facility] team!  Will you reach out to this patient and see if she would like training on how to use/administer Ozempic? We are starting her on it, for her diabetes and weight loss. If she does want some training, can we schedule her for a pharmacy visit with Dr. Valentina Lucks to discuss how to give the medication?  Thank you!  -BS

## 2021-12-04 NOTE — Telephone Encounter (Signed)
Patient has touched base with Dr. Valentina Lucks and is comfortable with using medication and she will follow up with him on 12/17/2021.   [name] [name], [place]

## 2021-12-05 ENCOUNTER — Telehealth: Payer: Self-pay

## 2021-12-05 NOTE — Telephone Encounter (Signed)
Patient calls nurse line reporting difficulties picking up Ozempic prescription.   I called patients pharmacy. Pharmacy reports the .'25mg'$  pen needs to be sent in vs the '2mg'$  pen.   Will forward to PCP to resend.

## 2021-12-08 ENCOUNTER — Other Ambulatory Visit: Payer: Self-pay | Admitting: Student

## 2021-12-08 MED ORDER — SEMAGLUTIDE(0.25 OR 0.5MG/DOS) 2 MG/1.5ML ~~LOC~~ SOPN
0.2500 mg | PEN_INJECTOR | SUBCUTANEOUS | 0 refills | Status: DC
Start: 2021-12-08 — End: 2021-12-10

## 2021-12-10 MED ORDER — OZEMPIC (0.25 OR 0.5 MG/DOSE) 2 MG/3ML ~~LOC~~ SOPN: 0.2500 mg | PEN_INJECTOR | SUBCUTANEOUS | 3 refills | Status: DC

## 2021-12-10 NOTE — Telephone Encounter (Signed)
Patient called my voice mail with request to assist in resolution of precription issue for new medication.   Appears that the old dosage form of Ozempic was prescribed. (1.93m instead of the new size of 3 ml)  New prescription for correct dosage form sent to [facility] Patient contact to share detail.  Explained adjustment and communicated new prescription should be ready later today.  Patient educated on purpose, proper use and potential adverse effects of nausea.  Following instruction patient verbalized understanding of treatment plan.   I also adjusted her follow-up date to be 1 week later 9/20 changed to 9/27.  Consider tolerability and plan for dose increase at that time.

## 2021-12-10 NOTE — Telephone Encounter (Signed)
Noted and agree. 

## 2021-12-17 ENCOUNTER — Ambulatory Visit: Payer: Medicare Other | Admitting: Pharmacist

## 2021-12-23 ENCOUNTER — Other Ambulatory Visit: Payer: Self-pay

## 2021-12-23 MED ORDER — FLUTICASONE PROPIONATE 50 MCG/ACT NA SUSP: 2.0000 | Freq: Every day | NASAL | 4 refills | Status: AC

## 2021-12-24 ENCOUNTER — Ambulatory Visit: Payer: Medicare Other | Admitting: Pharmacist

## 2021-12-24 DIAGNOSIS — E7800 Pure hypercholesterolemia, unspecified: Secondary | ICD-10-CM

## 2021-12-24 DIAGNOSIS — R7303 Prediabetes: Secondary | ICD-10-CM | POA: Diagnosis not present

## 2021-12-24 MED ORDER — METFORMIN HCL ER 500 MG PO TB24: 500.0000 mg | ORAL_TABLET | Freq: Two times a day (BID) | ORAL | 3 refills | Status: DC

## 2021-12-24 MED ORDER — EZETIMIBE 10 MG PO TABS: 10.0000 mg | ORAL_TABLET | Freq: Every day | ORAL | 3 refills | Status: DC

## 2021-12-24 NOTE — Assessment & Plan Note (Signed)
Prediabetes longstanding, currently uncontrolled. Patient is able to verbalize appropriate hypoglycemia management plan. Medication adherence appears good. Control is suboptimal due to dietary habits. -Continued GLP-1 Ozempic (generic semaglutide) 0.25 mg weekly, reassess at f/u in one month.  -Restarted metformin 500 mg BID.  -Patient educated on purpose, proper use, and potential adverse effects of restarting metformin.  - Will assess tolerance of restarting Metformin at next f/u visit, will consider titrating Ozempic and Metformin doses at that time. -Extensively discussed pathophysiology of diabetes, recommended lifestyle interventions, dietary effects on blood sugar control.  -Counseled on s/sx of and management of hypoglycemia.

## 2021-12-24 NOTE — Progress Notes (Signed)
S:     Chief Complaint  Patient presents with   Medication Management    Diabetes Management   [name] [name] is a 71 y.o. female who presents for diabetes evaluation, education, and management.  PMH is significant for HTN, GERD, hypercholesterolemia.  Patient was referred and last seen by Primary Care Provider, Dr. Marcina Millard, on 11/25/2021.  At last visit, started on Ozempic 0.25 mg weekly.   Today, patient arrives in good spirits and presents without any assistance.   Patient reports prediabetes was diagnosed in 2013.   Family/Social History: former smoker, quit in 2006  Current diabetes medications include: Ozempic (semaglutide) 0.25 mg weekly Current hypertension medications include: amlodipine 5 mg daily, metoprolol 25 mg BID Current hyperlipidemia medications include: atorvastatin 80 mg, ezetimibe 10 mg  Patient reports adherence to taking all medications as prescribed.   Do you feel that your medications are working for you? yes Have you been experiencing any side effects to the medications prescribed? Yes, experienced nausea when first started Ozempic, went away after short period. Do you have any problems obtaining medications due to transportation or finances? no Insurance coverage: [employer]  Patient denies hypoglycemic events. Patient is not checking blood sugars.  Patient reports nocturia (nighttime urination). 1-2 times per night. Patient reports neuropathy (nerve pain). Patient denies visual changes. Patient reports self foot exams.   Patient reported dietary habits: Eats 3 meals/day Breakfast: bacon and egg sandwich Lunch: [employer] wings, fried foods, vegetables Dinner: Salad, rice, mash potatoes Snacks: pie Drinks: sweet tea, water   Patient-reported exercise habits: Recently started going to [employer], does zumba, yoga classes, recently started walking. Wants to go regularly, at least 2 days per week.  O:   Review of Systems   Gastrointestinal:  Positive for nausea. Negative for abdominal pain, constipation, diarrhea and vomiting.  All other systems reviewed and are negative.   Physical Exam Vitals reviewed.  Constitutional:      Appearance: Normal appearance. She is normal weight.  Neurological:     Mental Status: She is alert.  Psychiatric:        Mood and Affect: Mood normal.        Behavior: Behavior normal.        Thought Content: Thought content normal.      Lab Results  Component Value Date   HGBA1C 6.8 11/25/2021   Vitals:   12/24/21 1123  BP: 114/65  Pulse: 60  SpO2: 97%    Lipid Panel     Component Value Date/Time   CHOL 164 07/09/2021 1148   TRIG 94 07/09/2021 1148   HDL 58 07/09/2021 1148   CHOLHDL 2.8 07/09/2021 1148   CHOLHDL 3.1 05/29/2015 1527   VLDL 17 05/29/2015 1527   LDLCALC 89 07/09/2021 1148   LDLDIRECT 129 (H) 01/01/2010 2219    Clinical Atherosclerotic Cardiovascular Disease (ASCVD): Yes  The 10-year ASCVD risk score (Arnett DK, et al., 2019) is: 20.7%   Values used to calculate the score:     Age: 24 years     Sex: Female     Is Non-Hispanic African American: Yes     Diabetic: Yes     Tobacco smoker: No     Systolic Blood Pressure: 774 mmHg     Is BP treated: Yes     HDL Cholesterol: 58 mg/dL     Total Cholesterol: 164 mg/dL    A/P: Prediabetes longstanding, currently uncontrolled. Patient is able to verbalize appropriate hypoglycemia management plan. Medication adherence  appears good. Control is suboptimal due to dietary habits. -Continued GLP-1 Ozempic (generic semaglutide) 0.25 mg weekly, reassess at f/u in one month.  -Restarted metformin 500 mg BID.  -Patient educated on purpose, proper use, and potential adverse effects of restarting metformin.  - Will assess tolerance of restarting Metformin at next f/u visit, will consider titrating Ozempic and Metformin doses at that time. -Extensively discussed pathophysiology of diabetes, recommended  lifestyle interventions, dietary effects on blood sugar control.  -Counseled on s/sx of and management of hypoglycemia.   Discussed weight loss and dietary goals. Patient goal weight is 135 lbs and commits to going to the [employer] at least 2 times per week to make this happen. Also discussed dietary habits, reducing intake of fried foods and sweet tea. Patient said she will make an effort to cut back on these types of foods, patient seems very motivated to lose weight and live healthier lifestyle.    Written patient instructions provided. Patient verbalized understanding of treatment plan.  Total time in face to face counseling 45 minutes.    Follow-up:  Pharmacist on 01/21/2022 PCP clinic visit not scheduled.  Patient seen with Martina Sinner, PharmD Candidate and  Jeneen Rinks, PharmD PGY-1 Resident.

## 2021-12-24 NOTE — Patient Instructions (Addendum)
It was nice to see you today! We made the following changes to your medications today Restart Metformin 500 mg twice daily, can start at one tablet a day Continue Ozempic 0.25 mg weekly

## 2021-12-25 NOTE — Progress Notes (Signed)
Reviewed: I agree with Dr. Koval's documentation and management. 

## 2022-01-19 ENCOUNTER — Ambulatory Visit: Payer: Medicare Other | Admitting: Podiatry

## 2022-01-19 DIAGNOSIS — B351 Tinea unguium: Secondary | ICD-10-CM

## 2022-01-19 DIAGNOSIS — Z79899 Other long term (current) drug therapy: Secondary | ICD-10-CM

## 2022-01-19 NOTE — Progress Notes (Unsigned)
   Denies any systemic complaints such as fevers, chills, nausea, vomiting. No acute changes since last appointment, and no other complaints at this time.   Objective: AAO x3, NAD DP/PT pulses palpable bilaterally, CRT less than 3 seconds Protective sensation intact with Simms Weinstein monofilament, vibratory sensation intact, Achilles tendon reflex intact No areas of pinpoint bony tenderness or pain with vibratory sensation. MMT 5/5, ROM WNL. No edema, erythema, increase in warmth to bilateral lower extremities.  No open lesions or pre-ulcerative lesions.  No pain with calf compression, swelling, warmth, erythema  Assessment:  Plan: -All treatment options discussed with the patient including all alternatives, risks, complications.  -Lamisil -Patient encouraged to call the office with any questions, concerns, change in symptoms.

## 2022-01-19 NOTE — Patient Instructions (Signed)
Terbinafine Tablets What is this medication? TERBINAFINE (TER bin a feen) treats fungal infections of the nails. It belongs to a group of medications called antifungals. It will not treat infections caused by bacteria or viruses. This medicine may be used for other purposes; ask your health care provider or pharmacist if you have questions. COMMON BRAND NAME(S): Lamisil, Terbinex What should I tell my care team before I take this medication? They need to know if you have any of these conditions: Liver disease An unusual or allergic reaction to terbinafine, other medications, foods, dyes, or preservatives Pregnant or trying to get pregnant Breast-feeding How should I use this medication? Take this medication by mouth with water. Take it as directed on the prescription label at the same time every day. You can take it with or without food. If it upsets your stomach, take it with food. Keep taking it unless your care team tells you to stop. A special MedGuide will be given to you by the pharmacist with each prescription and refill. Be sure to read this information carefully each time. Talk to your care team regarding the use of this medication in children. Special care may be needed. Overdosage: If you think you have taken too much of this medicine contact a poison control center or emergency room at once. NOTE: This medicine is only for you. Do not share this medicine with others. What if I miss a dose? If you miss a dose, take it as soon as you can unless it is more than 4 hours late. If it is more than 4 hours late, skip the missed dose. Take the next dose at the normal time. What may interact with this medication? Do not take this medication with any of the following: Pimozide Thioridazine This medication may also interact with the following: Beta blockers Caffeine Certain medications for mental health conditions Cimetidine Cyclosporine Medications for fungal infections like fluconazole  and ketoconazole Medications for irregular heartbeat like amiodarone, flecainide and propafenone Rifampin Warfarin This list may not describe all possible interactions. Give your health care provider a list of all the medicines, herbs, non-prescription drugs, or dietary supplements you use. Also tell them if you smoke, drink alcohol, or use illegal drugs. Some items may interact with your medicine. What should I watch for while using this medication? Visit your care team for regular checks on your progress. You may need blood work while you are taking this medication. It may be some time before you see the benefit from this medication. This medication may cause serious skin reactions. They can happen weeks to months after starting the medication. Contact your care team right away if you notice fevers or flu-like symptoms with a rash. The rash may be red or purple and then turn into blisters or peeling of the skin. Or, you might notice a red rash with swelling of the face, lips or lymph nodes in your neck or under your arms. This medication can make you more sensitive to the sun. Keep out of the sun, If you cannot avoid being in the sun, wear protective clothing and sunscreen. Do not use sun lamps or tanning beds/booths. What side effects may I notice from receiving this medication? Side effects that you should report to your care team as soon as possible: Allergic reactions--skin rash, itching, hives, swelling of the face, lips, tongue, or throat Change in sense of smell Change in taste Infection--fever, chills, cough, or sore throat Liver injury--right upper belly pain, loss of appetite, nausea,   light-colored stool, dark yellow or brown urine, yellowing skin or eyes, unusual weakness or fatigue Low red blood cell level--unusual weakness or fatigue, dizziness, headache, trouble breathing Lupus-like syndrome--joint pain, swelling, or stiffness, butterfly-shaped rash on the face, rashes that get worse  in the sun, fever, unusual weakness or fatigue Rash, fever, and swollen lymph nodes Redness, blistering, peeling, or loosening of the skin, including inside the mouth Unusual bruising or bleeding Worsening mood, feelings of depression Side effects that usually do not require medical attention (report to your care team if they continue or are bothersome): Diarrhea Gas Headache Nausea Stomach pain Upset stomach This list may not describe all possible side effects. Call your doctor for medical advice about side effects. You may report side effects to FDA at 1-800-FDA-1088. Where should I keep my medication? Keep out of the reach of children and pets. Store between 20 and 25 degrees C (68 and 77 degrees F). Protect from light. Get rid of any unused medication after the expiration date. To get rid of medications that are no longer needed or have expired: Take the medication to a medication take-back program. Check with your pharmacy or law enforcement to find a location. If you cannot return the medication, check the label or package insert to see if the medication should be thrown out in the garbage or flushed down the toilet. If you are not sure, ask your care team. If it is safe to put it in the trash, take the medication out of the container. Mix the medication with cat litter, dirt, coffee grounds, or other unwanted substance. Seal the mixture in a bag or container. Put it in the trash. NOTE: This sheet is a summary. It may not cover all possible information. If you have questions about this medicine, talk to your doctor, pharmacist, or health care provider.  2023 Elsevier/Gold Standard (2020-10-08 00:00:00)  

## 2022-01-21 ENCOUNTER — Ambulatory Visit: Payer: Medicare Other | Admitting: Pharmacist

## 2022-01-21 VITALS — BP 122/61 | HR 61 | Wt 177.4 lb

## 2022-01-21 DIAGNOSIS — I10 Essential (primary) hypertension: Secondary | ICD-10-CM | POA: Diagnosis not present

## 2022-01-21 DIAGNOSIS — G63 Polyneuropathy in diseases classified elsewhere: Secondary | ICD-10-CM

## 2022-01-21 DIAGNOSIS — R7303 Prediabetes: Secondary | ICD-10-CM

## 2022-01-21 DIAGNOSIS — D509 Iron deficiency anemia, unspecified: Secondary | ICD-10-CM

## 2022-01-21 DIAGNOSIS — I2510 Atherosclerotic heart disease of native coronary artery without angina pectoris: Secondary | ICD-10-CM

## 2022-01-21 MED ORDER — GABAPENTIN 100 MG PO CAPS: ORAL_CAPSULE | ORAL | 3 refills | Status: DC

## 2022-01-21 MED ORDER — FERROUS SULFATE 325 (65 FE) MG PO TABS: ORAL_TABLET | ORAL | 1 refills | Status: DC

## 2022-01-21 MED ORDER — OZEMPIC (0.25 OR 0.5 MG/DOSE) 2 MG/3ML ~~LOC~~ SOPN: 0.5000 mg | PEN_INJECTOR | SUBCUTANEOUS | 3 refills | Status: DC

## 2022-01-21 MED ORDER — METOPROLOL SUCCINATE ER 25 MG PO TB24: 25.0000 mg | ORAL_TABLET | Freq: Every day | ORAL | 3 refills | Status: DC

## 2022-01-21 NOTE — Assessment & Plan Note (Signed)
Diabetes longstanding currently controlled given A1C <7. Patient is able to verbalize appropriate hypoglycemia management plan. Medication adherence appears good. -Increased dose of GLP-1 Ozempic (generic semaglutide) to 5.03 mg plus 5 clicks. Reassess tolerability, including reduced appetite, at next visit.  -Changed metformin XR 500 mg BID to 2 tablets once daily.  -Patient educated on purpose, proper use, and potential adverse effects of nausea and GI upset with Ozempic and metformin.  -Extensively discussed pathophysiology of diabetes, recommended lifestyle interventions, dietary effects on blood sugar control.  -Counseled on s/sx of and management of hypoglycemia.  -Next A1c anticipated November 2023.

## 2022-01-21 NOTE — Assessment & Plan Note (Signed)
Hypertension longstanding currently controlled in office with multiple readings. However, patient reports elevated readings at home. Blood pressure goal of <130/80 mmHg. Medication adherence good. -Encouraged patient to continue to check blood pressure at home and bring monitor to next visit. Consider ambulatory blood pressure monitoring if discrepancies persist. -Switched metoprolol tartrate 12.5 mg BID to metoprolol succinate 25 mg once daily for reduced pill burden.

## 2022-01-21 NOTE — Assessment & Plan Note (Signed)
Diabetic neuropathy currently uncontrolled given persistent pain in the evenings. Patient also reports trouble falling asleep at night. -Increased gabapentin to 100 mg three times daily.

## 2022-01-21 NOTE — Progress Notes (Signed)
S:     Chief Complaint  Patient presents with   Medication Management    DM follow-up   71 y.o. female who presents for diabetes evaluation, education, and management.  PMH is significant for HTN, GERD, hypercholesterolemia.  Patient was last seen by Primary Care Provider, Dr. Marcina Millard, on 11/25/2021 Patient was last seen in Rx clinic on 12/24/2021.  At last visit, metformin was restarted at 500 mg BID   Today, patient arrives in good spirits and presents without any assistance.   Patient reports Diabetes was diagnosed in 2013.   Family/Social History: taking care of 9 year old mother  Current diabetes medications include: Ozempic (semaglutide) 0.25 mg weekly, metformin 500 mg BID Current hypertension medications include: amlodipine 5 mg daily, metoprolol tartrate 25 mg BID Current hyperlipidemia medications include: atorvastatin 80 mg, ezetimibe 10 mg  Patient reports adherence to taking all medications as prescribed, excluding metformin, which patient reports intermittent adherence to evening dose.  Do you feel that your medications are working for you? yes Have you been experiencing any side effects to the medications prescribed? Yes. Patient reports occasional nausea, occurred when she overate. Reports some diarrhea with initiation of metformin but reports this has since resolved. Do you have any problems obtaining medications due to transportation or finances? no Insurance coverage: Medicare  Home blood pressure readings: 140-150s/80-90s  Patient denies hypoglycemic events.  Reported 2 hour post-meal/random blood sugars: 120-150.  Patient reports nocturia (nighttime urination). 2-3 times Patient denies neuropathy (nerve pain). Patient denies visual changes. Patient reports self foot exams.   Patient reported dietary habits: Eats 1 meals/day and a smaller snack Patient reports often feeling lack of appetite. Dinner: bbq sandwich (if goes to [employer]), piece of  chicken or fish, some vegetables Snacks: peanut butter jelly sandwich, peanut butter crackers, grapes, fruit Drinks: water, if craving tea will have a half a glass and then switch to water  Patient-reported exercise habits: Goes to [employer] on tues/thurs, line-dancing or yoga. Walking at least a mile a few days a week.  O:   Review of Systems  All other systems reviewed and are negative.   Physical Exam Constitutional:      Appearance: Normal appearance. She is normal weight.  Pulmonary:     Effort: Pulmonary effort is normal.  Neurological:     General: No focal deficit present.     Mental Status: She is alert.  Psychiatric:        Mood and Affect: Mood normal.        Behavior: Behavior normal.        Thought Content: Thought content normal.        Judgment: Judgment normal.       Lab Results  Component Value Date   HGBA1C 6.8 11/25/2021   Vitals:   01/21/22 1125 01/21/22 1132  BP: (!) 115/55 122/61  Pulse: 61 61  SpO2: 100%     Lipid Panel     Component Value Date/Time   CHOL 164 07/09/2021 1148   TRIG 94 07/09/2021 1148   HDL 58 07/09/2021 1148   CHOLHDL 2.8 07/09/2021 1148   CHOLHDL 3.1 05/29/2015 1527   VLDL 17 05/29/2015 1527   LDLCALC 89 07/09/2021 1148   LDLDIRECT 129 (H) 01/01/2010 2219    A/P: Diabetes longstanding currently controlled given A1C <7. Patient is able to verbalize appropriate hypoglycemia management plan. Medication adherence appears good. -Increased dose of GLP-1 Ozempic (generic semaglutide) to 4.09 mg plus 5 clicks. Reassess tolerability, including  reduced appetite, at next visit.  -Changed metformin XR 500 mg BID to 2 tablets once daily.  -Patient educated on purpose, proper use, and potential adverse effects of nausea and GI upset with Ozempic and metformin.  -Extensively discussed pathophysiology of diabetes, recommended lifestyle interventions, dietary effects on blood sugar control.  -Counseled on s/sx of and management of  hypoglycemia.  -Next A1c anticipated November 2023.   Hypertension longstanding currently controlled in office with multiple readings. However, patient reports elevated readings at home. Blood pressure goal of <130/80 mmHg. Medication adherence good. -Encouraged patient to continue to check blood pressure at home and bring monitor to next visit. Consider ambulatory blood pressure monitoring if discrepancies persist. -Switched metoprolol tartrate 12.5 mg BID to metoprolol succinate 25 mg once daily for reduced pill burden.  Diabetic neuropathy currently uncontrolled given persistent pain in the evenings. Patient also reports trouble falling asleep at night. -Increased gabapentin to 100 mg three times daily.  History of iron deficiency anemia  Refilled iron sulfate maintenance therapy   Written patient instructions provided. Patient verbalized understanding of treatment plan.  Total time in face to face counseling 47 minutes.    Follow-up:  Pharmacist 3 weeks. Patient seen with Geraldo Docker, PharmD Candidate and Garrel Ridgel PharmD Candidate. [name]

## 2022-01-21 NOTE — Patient Instructions (Addendum)
It was great to see you today!  Keep up the great work making it to [employer] and walking a few times a week, I'm glad to hear you found an enjoyable way to exercise!  Today we made the following changes to your medications:  INCREASE Ozempic (semaglutide) to 7.33 PLUS 5 clicks once weekly.  INCREASE gabapentin to one tablet three times daily.  Finish your current supply of metoprolol tartrate twice daily, then [facility] to the new metoprolol succinate once daily.

## 2022-01-22 DIAGNOSIS — B351 Tinea unguium: Secondary | ICD-10-CM | POA: Insufficient documentation

## 2022-01-26 NOTE — Progress Notes (Signed)
Reviewed: I agree with Dr. Koval's documentation and management. 

## 2022-02-04 ENCOUNTER — Encounter: Payer: Self-pay | Admitting: Podiatry

## 2022-02-11 ENCOUNTER — Ambulatory Visit (INDEPENDENT_AMBULATORY_CARE_PROVIDER_SITE_OTHER): Payer: Medicare Other | Admitting: Pharmacist

## 2022-02-11 VITALS — BP 144/70 | HR 66 | Wt 175.0 lb

## 2022-02-11 DIAGNOSIS — E139 Other specified diabetes mellitus without complications: Secondary | ICD-10-CM

## 2022-02-11 DIAGNOSIS — I10 Essential (primary) hypertension: Secondary | ICD-10-CM | POA: Diagnosis not present

## 2022-02-11 DIAGNOSIS — G63 Polyneuropathy in diseases classified elsewhere: Secondary | ICD-10-CM | POA: Diagnosis not present

## 2022-02-11 MED ORDER — VALSARTAN 40 MG PO TABS: 40.0000 mg | ORAL_TABLET | Freq: Every day | ORAL | 3 refills | Status: DC

## 2022-02-11 MED ORDER — GABAPENTIN 100 MG PO CAPS: ORAL_CAPSULE | ORAL | 3 refills | Status: DC

## 2022-02-11 NOTE — Assessment & Plan Note (Signed)
Hypertension longstanding currently uncontrolled in office above SBP goal of <130. DBP at goal <80. Home cuff validated today, and average home BP over past three days also elevated above goal. Medication adherence good. Favor addition of ARB with hx of angina, although pt does not report recent chest pain. - Start Valsartan 40 mg daily. Repeat BMET in 3-4 weeks to assess SCr and K. - Continue amlodipine 5 mg daily - Continue metoprolol succinate 25 mg daily - Encouraged pt to continue checking BP at home and monitor for s/sx of hypotension like dizziness, lightheadedness

## 2022-02-11 NOTE — Patient Instructions (Addendum)
It was great to see you today!  We are making the following changes to your medications today:  START valsartan 40 mg daily.  INCREASE Ozempic (semaglutide) to 0.5 mg weekly  INCREASE gabapentin to 100 mg in the morning, 100 mg midday, and 200 mg at bedtime.   Schedule back with Dr. Marcina Millard after the holidays!

## 2022-02-11 NOTE — Progress Notes (Signed)
S:     Chief Complaint  Patient presents with   Medication Management    DM f/u   71 y.o. female who presents for diabetes evaluation, education, and management.  PMH is significant for HTN, GERD, hypercholesterolemia.  Patient was last seen by Primary Care Provider, Dr. Marcina Millard, on 11/25/2021 Patient was last seen in Rx clinic on 01/21/2022.  At last visit, increased to Ozempic to 1.47 mg + 5 clicks. Switched metoprolol to once daily (succinate). Incr gabapentin to 100 mg TID.  Today, patient arrives in good spirits and presents without any assistance. Had Steak and cheese sub last night (6in), small tea, did not feel well last night-> abdominal pain. Has not taken medications today (last took ~12 PM yesterday). Brought home BP cuff for validation.  Jan 2nd weight goal per pt: <170 lbs  Patient reports Diabetes was diagnosed in 2013.   Family/Social History: taking care of 32 year old mother  Current diabetes medications include: Ozempic (semaglutide) 8.29 mg + 5 clicks weekly (Wednesday), metformin 1000 mg daily  Current hypertension medications include: amlodipine 5 mg daily, metoprolol succ 25 mg daily  Current hyperlipidemia medications include: atorvastatin 80 mg, ezetimibe 10 mg  Patient reports adherence to taking all medications as prescribed.  Do you feel that your medications are working for you? yes Have you been experiencing any side effects to the medications prescribed? Yes. Feels GI burning sensation most days after taking medications. Some nausea related to larger meals/higher carbohydrate intake. Do you have any problems obtaining medications due to transportation or finances? no Insurance coverage: Medicare  Home blood pressure readings- checking in the morning and before bedtime: avg of prev 3: 156/73, 147/70, 164/73, 57/76, 136/75, 132/70  Clinic cuff today: 144/72 Home cuff today: 145/70  Patient denies hypoglycemic events.  Reported FBG:  [phone]  Patient reports nocturia (nighttime urination). 2-3 times Patient denies neuropathy (nerve pain). Patient denies visual changes. Patient reports self foot exams.   Patient reported dietary habits: Eats 1 meals/day and a smaller snack (does not typically eat until 12PM or after) Patient reports often feeling lack of appetite. Dinner: bbq sandwich (if goes to [employer]), piece of chicken or fish, some vegetables Snacks: peanut butter jelly sandwich, peanut butter crackers, grapes, fruit Drinks: water, if craving tea will have a half a glass and then switch to water  Patient-reported exercise habits: Goes to [employer] on tues/thurs, line-dancing or yoga. Walking at least a mile a few days a week.  O:   Review of Systems  Cardiovascular:  Negative for chest pain and leg swelling.  All other systems reviewed and are negative.   Physical Exam Constitutional:      Appearance: Normal appearance. She is normal weight.  Pulmonary:     Effort: Pulmonary effort is normal.  Musculoskeletal:     Right lower leg: No edema.     Left lower leg: No edema.  Neurological:     General: No focal deficit present.     Mental Status: She is alert.  Psychiatric:        Mood and Affect: Mood normal.        Behavior: Behavior normal.        Thought Content: Thought content normal.        Judgment: Judgment normal.     Lab Results  Component Value Date   HGBA1C 6.8 11/25/2021   There were no vitals filed for this visit.   Lipid Panel     Component  Value Date/Time   CHOL 164 07/09/2021 1148   TRIG 94 07/09/2021 1148   HDL 58 07/09/2021 1148   CHOLHDL 2.8 07/09/2021 1148   CHOLHDL 3.1 05/29/2015 1527   VLDL 17 05/29/2015 1527   LDLCALC 89 07/09/2021 1148   LDLDIRECT 129 (H) 01/01/2010 2219    A/P: Diabetes longstanding currently controlled given A1C <7. Patient is able to verbalize appropriate hypoglycemia management plan. Medication adherence appears good. Pt is motivated to  lose more weight. Suspect GI AE with Ozempic (semaglutide) likely related to portion size and food choices, so pt willing to increase does and attempt to reduce intake/portion size of calorie dense foods. -Increased dose of GLP-1 Ozempic (semaglutide) to 0.5 mg plus 5 clicks. Reassess tolerability at next visit, then consider increasing to 1 mg weekly.  -Continue metformin XR 1000 mg daily -Patient educated on purpose, proper use, and potential adverse effects of nausea and GI upset with Ozempic and metformin.  -Discussed taking protonix in the AM at least 30 min before eating to reduce burning sensation after eating and taking meds. -Extensively discussed pathophysiology of diabetes, recommended lifestyle interventions, dietary effects on blood sugar control.  -Counseled on s/sx of and management of hypoglycemia.   Hypertension longstanding currently uncontrolled in office above SBP goal of <130. DBP at goal <80. Home cuff validated today, and average home BP over past three days also elevated above goal. Medication adherence good. Favor addition of ARB with hx of angina, although pt does not report recent chest pain. - Start Valsartan 40 mg daily. Repeat BMET in 3-4 weeks to assess SCr and K. - Continue amlodipine 5 mg daily - Continue metoprolol succinate 25 mg daily - Encouraged pt to continue checking BP at home and monitor for s/sx of hypotension like dizziness, lightheadedness  Neuropathy currently improved control during the day, but sleep only slightly improved on increased dose of gabapentin.  -Increased gabapentin to 100 mg in the morning and at lunch time, then gabapentin 200 mg at bedtime.  Discussed above with PCP, Dr. Marcina Millard,  Written patient instructions provided. Patient verbalized understanding of treatment plan.  Total time in face to face counseling 40 minutes.    Follow-up:  PCP [place] in Jan-Feb 2024 Patient seen with Geraldo Docker, PharmD Candidate and Park Liter,  PharmD Candidate. [name]

## 2022-02-11 NOTE — Assessment & Plan Note (Signed)
Neuropathy currently improved control during the day, but sleep only slightly improved on increased dose of gabapentin.  -Increased gabapentin to 100 mg in the morning and at lunch time, then gabapentin 200 mg at bedtime.

## 2022-02-11 NOTE — Assessment & Plan Note (Signed)
Diabetes longstanding currently controlled given A1C <7. Patient is able to verbalize appropriate hypoglycemia management plan. Medication adherence appears good. Pt is motivated to lose more weight. Suspect GI AE with Ozempic (semaglutide) likely related to portion size and food choices, so pt willing to increase does and attempt to reduce intake/portion size of calorie dense foods. -Increased dose of GLP-1 Ozempic (semaglutide) to 0.5 mg plus 5 clicks. Reassess tolerability at next visit, then consider increasing to 1 mg weekly.  -Continue metformin XR 1000 mg daily -Patient educated on purpose, proper use, and potential adverse effects of nausea and GI upset with Ozempic and metformin.  -Discussed taking protonix in the AM at least 30 min before eating to reduce burning sensation after eating and taking meds. -Extensively discussed pathophysiology of diabetes, recommended lifestyle interventions, dietary effects on blood sugar control.  -Counseled on s/sx of and management of hypoglycemia.

## 2022-02-11 NOTE — Progress Notes (Signed)
Reviewed: I agree with Dr. Koval's documentation and management. 

## 2022-04-07 NOTE — Patient Instructions (Addendum)
It was great to see you! Thank you for allowing me to participate in your care!  Your Diabetes is doing well. We can stop one of your medicines!  Our plans for today:  - Diabetes  Stop Metformin  Continue Ozempic 0.25 mg weekly  - Hypertension  Keep log of weird feeling (when it happens, how long it last, what meds you've taken, what your BP was at that time) and bring to next appointment.  Continue Metoprolol and valsartan  -F/u in 3 months  We are checking some labs today, I will call you if they are abnormal will send you a MyChart message or a letter if they are normal.  If you do not hear about your labs in the next 2 weeks please let us know.  Take care and seek immediate care sooner if you develop any concerns.   Dr. Holley Bouche, MD [place]

## 2022-04-07 NOTE — Progress Notes (Unsigned)
  SUBJECTIVE:   CHIEF COMPLAINT / HPI:   F/u DM  DM Meds: Metformin 1000 mg daily, Ozempic 0.5 mg q wk (Report's taking 0.25, instead of 0.5).  A1c: 6.8 - 11/25/21 CBG range: Sporadically checking ranging around 100's, lowest was 91. Symptoms: Denies any symptoms of hypoglycemia (nausea, dizzy, weak) Fasting CBG: not checking, encouraged to record and bring to next visit Med Compliance: good, taking daily   HTN Meds: Metop Suc 25 mg daily, valsartan 40 mg Med compliance: Can't take valsartan and metop at same time, causes her to feel funny/could pass out/dizzy/numb. BP range 140-170/80's during events. Denies any fluttering sensation, rapid heart beat, or feeling of skipped beats, and denies any LOC.   HLD Atorvostatin 80 mg daily, ezetimibe 10 mg Lab Results  Component Value Date   CHOL 128 04/21/2022   HDL 51 04/21/2022   LDLCALC 55 04/21/2022   LDLDIRECT 129 (H) 01/01/2010   TRIG 122 04/21/2022   CHOLHDL 2.5 04/21/2022  Compliance: good, taking both meds    PERTINENT  PMH / PSH: DM, HTN, HLD    OBJECTIVE:  BP 118/75   Pulse 77   Temp 98.3 F (36.8 C)   Ht '5\' 2"'$  (1.575 m)   Wt 173 lb 3.2 oz (78.6 kg)   SpO2 95%   BMI 31.68 kg/m  Physical Exam Constitutional:      General: She is not in acute distress.    Appearance: Normal appearance. She is not ill-appearing.  Cardiovascular:     Rate and Rhythm: Normal rate and regular rhythm.     Pulses: Normal pulses.     Heart sounds: Normal heart sounds. No murmur heard.    No friction rub. No gallop.  Pulmonary:     Effort: Pulmonary effort is normal. No respiratory distress.     Breath sounds: Normal breath sounds. No stridor. No wheezing, rhonchi or rales.  Abdominal:     General: Bowel sounds are normal. There is no distension.     Palpations: Abdomen is soft.     Tenderness: There is no abdominal tenderness.  Neurological:     Mental Status: She is alert.      ASSESSMENT/PLAN:  Other specified diabetes  mellitus without complication, without long-term current use of insulin ([place]) Assessment & Plan: Patient A1c today 5.5. Patient control is good and can decrease some of her DM meds. Will d/c metformin and continue ozempic at this time. -D/c metformin -Continue Ozempic 0.25 mg -F/u 3 months  Orders: -     POCT glycosylated hemoglobin (Hb A1C) -     Lipid panel  Need for immunization against influenza -     Flu Vaccine QUAD High Dose(Fluad)  Essential hypertension Assessment & Plan: Patient BP at goal today and patient compliant w/ medications. Patient note's she is not taking amlodipine, previously prescribed on her med list. Will d/c as patient BP well controlled. Patient notes events of feeling "funny" she describes as kinda dizzy, numb, but not actually dizzy or light headed. Patient denies any LOC. Will have patient monitor and record episodes, to be discussed at next visit.  -Continue Metop Succ 25, Valsartan 40 mg -Keep log of "funny" feeling events   HYPERCHOLESTEROLEMIA Assessment & Plan: Patient due for lipid panel, and has good compliance w/ medications. -Atorvastatin 80 mg, Ezetimibe 10 mg -lipid panel    No follow-ups on file. Holley Bouche, MD 04/22/2022, 9:14 AM PGY-2, [place]

## 2022-04-16 ENCOUNTER — Other Ambulatory Visit: Payer: Self-pay | Admitting: Podiatry

## 2022-04-16 ENCOUNTER — Other Ambulatory Visit: Payer: Self-pay | Admitting: Family Medicine

## 2022-04-16 DIAGNOSIS — I2510 Atherosclerotic heart disease of native coronary artery without angina pectoris: Secondary | ICD-10-CM

## 2022-04-20 ENCOUNTER — Ambulatory Visit (INDEPENDENT_AMBULATORY_CARE_PROVIDER_SITE_OTHER): Payer: Medicare Other | Admitting: Podiatry

## 2022-04-20 ENCOUNTER — Telehealth: Payer: Self-pay

## 2022-04-20 DIAGNOSIS — B351 Tinea unguium: Secondary | ICD-10-CM

## 2022-04-20 DIAGNOSIS — Z79899 Other long term (current) drug therapy: Secondary | ICD-10-CM

## 2022-04-20 NOTE — Telephone Encounter (Signed)
Patient called requesting to speak with a Freight forwarder. I received the call and patient sated that she can not afford to continue to pay her co-pay to see Dr. Jacqualyn Posey if she can not get her lab work done. Patient was order lab work at the last office visit but patient stated she never received the order and has not been started the Lamisil medication. After reviewing the patient's chart I can see the lab order was placed on 01/19/22 but patient does not remember being advised to go to [facility] to have the blood work drawn.

## 2022-04-20 NOTE — Patient Instructions (Signed)
Terbinafine Tablets What is this medication? TERBINAFINE (TER bin a feen) treats fungal infections of the nails. It belongs to a group of medications called antifungals. It will not treat infections caused by bacteria or viruses. This medicine may be used for other purposes; ask your health care provider or pharmacist if you have questions. COMMON BRAND NAME(S): Lamisil, Terbinex What should I tell my care team before I take this medication? They need to know if you have any of these conditions: Liver disease An unusual or allergic reaction to terbinafine, other medications, foods, dyes, or preservatives Pregnant or trying to get pregnant Breast-feeding How should I use this medication? Take this medication by mouth with water. Take it as directed on the prescription label at the same time every day. You can take it with or without food. If it upsets your stomach, take it with food. Keep taking it unless your care team tells you to stop. A special MedGuide will be given to you by the pharmacist with each prescription and refill. Be sure to read this information carefully each time. Talk to your care team regarding the use of this medication in children. Special care may be needed. Overdosage: If you think you have taken too much of this medicine contact a poison control center or emergency room at once. NOTE: This medicine is only for you. Do not share this medicine with others. What if I miss a dose? If you miss a dose, take it as soon as you can unless it is more than 4 hours late. If it is more than 4 hours late, skip the missed dose. Take the next dose at the normal time. What may interact with this medication? Do not take this medication with any of the following: Pimozide Thioridazine This medication may also interact with the following: Beta blockers Caffeine Certain medications for mental health conditions Cimetidine Cyclosporine Medications for fungal infections like fluconazole  and ketoconazole Medications for irregular heartbeat like amiodarone, flecainide and propafenone Rifampin Warfarin This list may not describe all possible interactions. Give your health care provider a list of all the medicines, herbs, non-prescription drugs, or dietary supplements you use. Also tell them if you smoke, drink alcohol, or use illegal drugs. Some items may interact with your medicine. What should I watch for while using this medication? Visit your care team for regular checks on your progress. You may need blood work while you are taking this medication. It may be some time before you see the benefit from this medication. This medication may cause serious skin reactions. They can happen weeks to months after starting the medication. Contact your care team right away if you notice fevers or flu-like symptoms with a rash. The rash may be red or purple and then turn into blisters or peeling of the skin. Or, you might notice a red rash with swelling of the face, lips or lymph nodes in your neck or under your arms. This medication can make you more sensitive to the sun. Keep out of the sun, If you cannot avoid being in the sun, wear protective clothing and sunscreen. Do not use sun lamps or tanning beds/booths. What side effects may I notice from receiving this medication? Side effects that you should report to your care team as soon as possible: Allergic reactions--skin rash, itching, hives, swelling of the face, lips, tongue, or throat Change in sense of smell Change in taste Infection--fever, chills, cough, or sore throat Liver injury--right upper belly pain, loss of appetite, nausea,  light-colored stool, dark yellow or brown urine, yellowing skin or eyes, unusual weakness or fatigue Low red blood cell level--unusual weakness or fatigue, dizziness, headache, trouble breathing Lupus-like syndrome--joint pain, swelling, or stiffness, butterfly-shaped rash on the face, rashes that get worse  in the sun, fever, unusual weakness or fatigue Rash, fever, and swollen lymph nodes Redness, blistering, peeling, or loosening of the skin, including inside the mouth Unusual bruising or bleeding Worsening mood, feelings of depression Side effects that usually do not require medical attention (report to your care team if they continue or are bothersome): Diarrhea Gas Headache Nausea Stomach pain Upset stomach This list may not describe all possible side effects. Call your doctor for medical advice about side effects. You may report side effects to FDA at 1-800-FDA-1088. Where should I keep my medication? Keep out of the reach of children and pets. Store between 20 and 25 degrees C (68 and 77 degrees F). Protect from light. Get rid of any unused medication after the expiration date. To get rid of medications that are no longer needed or have expired: Take the medication to a medication take-back program. Check with your pharmacy or law enforcement to find a location. If you cannot return the medication, check the label or package insert to see if the medication should be thrown out in the garbage or flushed down the toilet. If you are not sure, ask your care team. If it is safe to put it in the trash, take the medication out of the container. Mix the medication with cat litter, dirt, coffee grounds, or other unwanted substance. Seal the mixture in a bag or container. Put it in the trash. NOTE: This sheet is a summary. It may not cover all possible information. If you have questions about this medicine, talk to your doctor, pharmacist, or health care provider.  2023 Elsevier/Gold Standard (2020-10-08 00:00:00)

## 2022-04-20 NOTE — Progress Notes (Unsigned)
Subjective: Chief Complaint  Patient presents with   Foot Problem    3 month follow up on foot fungus   72 year old female presents today for follow-up evaluation of fungus as after having her right big toenail removed in July.  She states that she is doing well and the pain.  Denies any redness or drainage but still concerned that the coloration.   Objective: AAO x3, NAD DP/PT pulses palpable bilaterally, CRT less than 3 seconds The toenail is starting to grow and however is hypertrophic, dystrophic with yellow, brown discoloration.  No edema, erythema or signs of infection.  No open lesions. No pain with calf compression, swelling, warmth, erythema  Assessment: Onychomycosis  Plan: -All treatment options discussed with the patient including all alternatives, risks, complications.  -Discussed treatment options for nail fungus including oral, topical as well as alternative treatments.  At this time the patient wants to proceed with oral Lamisil.  We discussed side effects of the medication and success rates.  We will check a CBC and LFT prior to starting the medication.  Once I receive the results of this I will then call the medication in.  -Patient encouraged to call the office with any questions, concerns, change in symptoms.    *no charge for today, just reprinted blood work  Trula Slade DPM

## 2022-04-21 ENCOUNTER — Other Ambulatory Visit: Payer: Self-pay | Admitting: Podiatry

## 2022-04-21 ENCOUNTER — Ambulatory Visit (INDEPENDENT_AMBULATORY_CARE_PROVIDER_SITE_OTHER): Payer: Medicare Other | Admitting: Student

## 2022-04-21 ENCOUNTER — Encounter: Payer: Self-pay | Admitting: Student

## 2022-04-21 VITALS — BP 118/75 | HR 77 | Temp 98.3°F | Ht 62.0 in | Wt 173.2 lb

## 2022-04-21 DIAGNOSIS — I10 Essential (primary) hypertension: Secondary | ICD-10-CM

## 2022-04-21 DIAGNOSIS — Z23 Encounter for immunization: Secondary | ICD-10-CM | POA: Diagnosis not present

## 2022-04-21 DIAGNOSIS — Z79899 Other long term (current) drug therapy: Secondary | ICD-10-CM

## 2022-04-21 DIAGNOSIS — E7800 Pure hypercholesterolemia, unspecified: Secondary | ICD-10-CM | POA: Diagnosis not present

## 2022-04-21 DIAGNOSIS — E139 Other specified diabetes mellitus without complications: Secondary | ICD-10-CM | POA: Diagnosis not present

## 2022-04-21 LAB — CBC WITH DIFFERENTIAL/PLATELET
Basophils Absolute: 0.1 10*3/uL (ref 0.0–0.2)
Basos: 1 %
EOS (ABSOLUTE): 0.3 10*3/uL (ref 0.0–0.4)
Eos: 4 %
Hematocrit: 39.9 % (ref 34.0–46.6)
Hemoglobin: 12.6 g/dL (ref 11.1–15.9)
Immature Grans (Abs): 0 10*3/uL (ref 0.0–0.1)
Immature Granulocytes: 0 %
Lymphocytes Absolute: 2.8 10*3/uL (ref 0.7–3.1)
Lymphs: 39 %
MCH: 22 pg — ABNORMAL LOW (ref 26.6–33.0)
MCHC: 31.6 g/dL (ref 31.5–35.7)
MCV: 70 fL — ABNORMAL LOW (ref 79–97)
Monocytes Absolute: 0.3 10*3/uL (ref 0.1–0.9)
Monocytes: 5 %
Neutrophils Absolute: 3.8 10*3/uL (ref 1.4–7.0)
Neutrophils: 51 %
Platelets: 418 10*3/uL (ref 150–450)
RBC: 5.72 x10E6/uL — ABNORMAL HIGH (ref 3.77–5.28)
RDW: 16.6 % — ABNORMAL HIGH (ref 11.7–15.4)
WBC: 7.4 10*3/uL (ref 3.4–10.8)

## 2022-04-21 LAB — HEPATIC FUNCTION PANEL
ALT: 13 IU/L (ref 0–32)
AST: 17 IU/L (ref 0–40)
Albumin: 4.4 g/dL (ref 3.8–4.8)
Alkaline Phosphatase: 76 IU/L (ref 44–121)
Bilirubin Total: 1.1 mg/dL (ref 0.0–1.2)
Bilirubin, Direct: 0.27 mg/dL (ref 0.00–0.40)
Total Protein: 7 g/dL (ref 6.0–8.5)

## 2022-04-21 LAB — POCT GLYCOSYLATED HEMOGLOBIN (HGB A1C): HbA1c, POC (controlled diabetic range): 5.5 % (ref 0.0–7.0)

## 2022-04-21 MED ORDER — TERBINAFINE HCL 250 MG PO TABS: 250.0000 mg | ORAL_TABLET | Freq: Every day | ORAL | 0 refills | Status: DC

## 2022-04-21 NOTE — Telephone Encounter (Signed)
Patient had her lab work completed already.

## 2022-04-22 LAB — LIPID PANEL
Chol/HDL Ratio: 2.5 ratio (ref 0.0–4.4)
Cholesterol, Total: 128 mg/dL (ref 100–199)
HDL: 51 mg/dL (ref >39)
LDL Chol Calc (NIH): 55 mg/dL (ref 0–99)
Triglycerides: 122 mg/dL (ref 0–149)
VLDL Cholesterol Cal: 22 mg/dL (ref 5–40)

## 2022-04-22 NOTE — Assessment & Plan Note (Signed)
Patient due for lipid panel, and has good compliance w/ medications. -Atorvastatin 80 mg, Ezetimibe 10 mg -lipid panel

## 2022-04-22 NOTE — Assessment & Plan Note (Signed)
Patient BP at goal today and patient compliant w/ medications. Patient note's she is not taking amlodipine, previously prescribed on her med list. Will d/c as patient BP well controlled. Patient notes events of feeling "funny" she describes as kinda dizzy, numb, but not actually dizzy or light headed. Patient denies any LOC. Will have patient monitor and record episodes, to be discussed at next visit.  -Continue Metop Succ 25, Valsartan 40 mg -Keep log of "funny" feeling events

## 2022-04-22 NOTE — Assessment & Plan Note (Signed)
Patient A1c today 5.5. Patient control is good and can decrease some of her DM meds. Will d/c metformin and continue ozempic at this time. -D/c metformin -Continue Ozempic 0.25 mg -F/u 3 months

## 2022-04-23 ENCOUNTER — Encounter: Payer: Self-pay | Admitting: Student

## 2022-04-27 LAB — HM DIABETES EYE EXAM

## 2022-05-18 ENCOUNTER — Telehealth: Payer: Self-pay

## 2022-05-18 MED ORDER — SERTRALINE HCL 100 MG PO TABS: 50.0000 mg | ORAL_TABLET | Freq: Every day | ORAL | 3 refills | Status: DC

## 2022-05-18 MED ORDER — ESTROGENS CONJUGATED 0.625 MG/GM VA CREA: 1.0000 | TOPICAL_CREAM | Freq: Every day | VAGINAL | 12 refills | Status: DC

## 2022-05-18 NOTE — Telephone Encounter (Signed)
Patient calls nurse line requesting refills on Zoloft and Premarin.   She has an upcoming apt with PCP on 3/1.

## 2022-05-19 NOTE — Progress Notes (Signed)
Cardiology Office Note:    Date:  05/20/2022   ID:  [name] [name], DOB 09/22/1950, MRN [id number]  PCP:  [name] Kinds, MD    [facility] Providers Cardiologist:  [name] Millers, MD     Referring MD: [name] Kinds, MD   No chief complaint on file.   History of Present Illness:    [name] [name] is a 72 y.o. female with a hx of hypertension, CAD (s/p DES to the ostial ramus in 2018), GERD, DM, HLD, palpitations, orthostatic hypotension, anxiety and depression.  In 2018 she had a treadmill Myoview for concerns of chest pain, which was a high risk study, she was sent for heart catheterization in December 2018 which revealed an 80% ostial ramus with an EF of 55%, DES was placed to the ostial ramus.  Previously wore a monitor for palpitations in August 2022 which revealed dominantly sinus rhythm with occasional PACs and rare PVCs.  Echocardiogram in August 2022 revealed an EF of 60 to 65%, no RWMA, trivial MR, mild aortic valve sclerosis without evidence of stenosis.  She was most recently last seen in our office on 04/25/2021 by [name] Reining, NP, at that time she was doing well from a cardiac perspective.  She was feeling well, eager to become more active and was contemplating starting to walk more.  A carotid ultrasound was arranged at this time which revealed mild stenosis of her left and right carotid artery.   She presents today for a follow-up visit.  She states she is doing relatively well from a cardiac perspective.  She does endorse a high level of personal stress, she is the primary caregiver for her mother, a special needs niece and generally did go-to person for her large family.  She was evaluated by her PCP in December, her blood pressure was elevated, her Norvasc was discontinued and she was started on valsartan.  She has been checking her blood pressure sporadically at home, some blood pressure readings are in the 140 systolic range, but she has also had  times where they are in the 150 systolic.  She was recently started on Ozempic for weight loss and diabetes, she has lost 10 pounds over the last 2 months.  She did have an episode a few nights ago of abdominal pain, stated it felt like trapped gas and eventually subsided.  It was not reminiscent of her previous chest pain that she had with her MI. She denies chest pain, palpitations, dyspnea, pnd, orthopnea, n, v, dizziness, syncope, edema, weight gain, or early satiety.   Past Medical History:  Diagnosis Date   Arthritis    Blood transfusion without reported diagnosis    Cataract    Coronary artery disease    Depression    Hyperlipidemia    Hypertension    Microcytic anemia    Mitral valve prolapse     Past Surgical History:  Procedure Laterality Date   ABDOMINAL HYSTERECTOMY     CARDIAC CATHETERIZATION     CATARACT EXTRACTION, BILATERAL     COLONOSCOPY     greater than 10 years ago   CORONARY STENT INTERVENTION N/A 03/01/2017   Procedure: CORONARY STENT INTERVENTION;  Surgeon: Kathleene Hazel, MD;  [facility];  Service: Cardiovascular;  Laterality: N/A;   INTRAVASCULAR PRESSURE WIRE/FFR STUDY N/A 03/01/2017   Procedure: INTRAVASCULAR PRESSURE WIRE/FFR STUDY;  Surgeon: Kathleene Hazel, MD;  [facility];  Service: Cardiovascular;  Laterality: N/A;   LEFT HEART CATH  AND CORONARY ANGIOGRAPHY N/A 03/01/2017   Procedure: LEFT HEART CATH AND CORONARY ANGIOGRAPHY;  Surgeon: Kathleene Hazel, MD;  [facility]: [facility];  Service: Cardiovascular;  Laterality: N/A;   PARTIAL HYSTERECTOMY      Current Medications: Current Meds  Medication Sig   ammonium lactate (LAC-HYDRIN) 12 % lotion APPLY 1 APPLICATION TOPICALLY AS NEEDED FOR DRY SKIN.   aspirin 81 MG tablet Take 81 mg by mouth daily.   atorvastatin (LIPITOR) 80 MG tablet Take 1 tablet (80 mg total) by mouth daily.   conjugated estrogens (PREMARIN) vaginal cream Place 1  Applicatorful vaginally daily.   cycloSPORINE (RESTASIS) 0.05 % ophthalmic emulsion 1 drop 2 (two) times daily. Uses PRN   ezetimibe (ZETIA) 10 MG tablet Take 1 tablet (10 mg total) by mouth daily.   ferrous sulfate 325 (65 FE) MG tablet TAKE 1 TABLET BY MOUTH EVERY DAY WITH BREAKFAST   fluticasone (FLONASE) 50 MCG/ACT nasal spray Place 2 sprays into both nostrils daily.   gabapentin (NEURONTIN) 100 MG capsule Take 1 capsule (100 mg total) by mouth every morning AND 1 capsule (100 mg total) daily with lunch AND 2 capsules (200 mg total) at bedtime.   metoprolol succinate (TOPROL-XL) 25 MG 24 hr tablet TAKE 1 TABLET (25 MG TOTAL) BY MOUTH DAILY.   nitroGLYCERIN (NITROSTAT) 0.4 MG SL tablet DISSOLVE 1 TABLET UNDER THE TONGUE EVERY 5 MINUTES AS NEEDED FOR CHEST PAIN. DO NOT EXCEED A TOTAL OF 3 DOSES IN 15 MINUTES. (ORIG)   pantoprazole (PROTONIX) 40 MG tablet TAKE ONE TABLET BY MOUTH TWICE DAILY @ 9AM & 5PM   Polyethyl Glycol-Propyl Glycol (SYSTANE) 0.4-0.3 % GEL ophthalmic gel Place 1 Application into both eyes.   polyethylene glycol powder (GLYCOLAX/MIRALAX) 17 GM/SCOOP powder Take 17 g by mouth 2 (two) times daily as needed.   Semaglutide,0.25 or 0.5MG /DOS, (OZEMPIC, 0.25 OR 0.5 MG/DOSE,) 2 MG/3ML SOPN Inject 0.5 mg into the skin once a week. Use as directed   sertraline (ZOLOFT) 100 MG tablet Take 0.5 tablets (50 mg total) by mouth daily.   terbinafine (LAMISIL) 250 MG tablet Take 1 tablet (250 mg total) by mouth daily.   tiZANidine (ZANAFLEX) 4 MG tablet TAKE ONE TABLET BY MOUTH EVERY 6 HOURS AS NEEDED FOR MUSCLE SPASMS (VIAL)   valsartan (DIOVAN) 40 MG tablet Take 1 tablet (40 mg total) by mouth daily.   Current Facility-Administered Medications for the 05/20/22 encounter (Office Visit) with Ronney Asters, NP  Medication   diclofenac Sodium (VOLTAREN) 1 % topical gel 2 g     Allergies:   Patient has no known allergies.   Social History   Socioeconomic History   Marital status: Married     Spouse name: [name] [name]   Number of children: 3   Years of education: 12   Highest education level: High school graduate  Occupational History    Comment: Retired  Tobacco Use   Smoking status: Former    Packs/day: 0.50    Years: 20.00    Total pack years: 10.00    Types: Cigarettes    Quit date: 07/30/2004    Years since quitting: 17.8   Smokeless tobacco: Never  Vaping Use   Vaping Use: Never used  Substance and Sexual Activity   Alcohol use: Yes    Alcohol/week: 0.0 standard drinks of alcohol    Comment: Occasional   Drug use: No   Sexual activity: Yes    Partners: Male    Birth control/protection: Post-menopausal  Other  Topics Concern   Not on file  Social History Narrative   Patient lives in [place] with her husband and niece.   Patient has 3 daughters she is close with. One lives right next door.    Patient enjoys sewing, cooking, reading, and outdoor activities.    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    Social Determinants of Health   Financial Resource Strain: Low Risk  (04/21/2019)   Overall Financial Resource Strain (CARDIA)    Difficulty of Paying Living Expenses: Not hard at all  Food Insecurity: No Food Insecurity (04/21/2019)   Hunger Vital Sign    Worried About Running Out of Food in the Last Year: Never true    Ran Out of Food in the Last Year: Never true  Transportation Needs: No Transportation Needs (04/21/2019)   PRAPARE - Administrator, Civil Service (Medical): No    Lack of Transportation (Non-Medical): No  Physical Activity: Sufficiently Active (04/21/2019)   Exercise Vital Sign    Days of Exercise per Week: 3 days    Minutes of Exercise per Session: 60 min  Stress: No Stress Concern Present (04/21/2019)   [employer] of Occupational Health - Occupational Stress Questionnaire    Feeling of Stress : Only a little  Social Connections: Socially Integrated (04/21/2019)   Social Connection and  Isolation Panel [NHANES]    Frequency of Communication with Friends and Family: More than three times a week    Frequency of Social Gatherings with Friends and Family: More than three times a week    Attends Religious Services: More than 4 times per year    Active Member of [employer] or Organizations: Yes    Attends Engineer, structural: More than 4 times per year    Marital Status: Married     Family History: The patient's family history includes Cancer in her father; Dementia in her sister; Diabetes in her mother; Esophageal cancer in her brother and sister; Heart disease in her mother; Hypertension in her mother and sister; Pancreatitis in her sister. There is no history of Colon cancer, Stomach cancer, or Rectal cancer.  ROS:   Please see the history of present illness.    All other systems reviewed and are negative.  EKGs/Labs/Other Studies Reviewed:    The following studies were reviewed today:  04/29/21 Carotid US - mild, bilateral stenosis  11/07/20 echo complete - EF of 60 to 65%, no RWMA, trivial MR, mild aortic valve sclerosis without evidence of stenosis.  11/11/20 monitor - Sinus bradycardia, normal sinus rhythm, sinus tachycardia, occasional PAC, brief PAT, rare PVC  03/01/2017 LHC -  Mid RCA lesion is 20% stenosed. Ost Ramus lesion is 80% stenosed. A drug-eluting stent was successfully placed using a STENT PROMUS PREM MR 3.0X12. Post intervention, there is a 0% residual stenosis. The left ventricular systolic function is normal. LV end diastolic pressure is normal. The left ventricular ejection fraction is 50-55% by visual estimate. There is no mitral valve regurgitation.   1. Unstable angina 2. Severe stenosis ostium of the Ramus Intermediate branch 3. Successful PTCA/DES x 1 ostium of the intermediate branch   Recommendations: DAPT with ASA and Plavix for one year. Continue statin. Start beta blocker. D/C home today after 6 hours bedrest per same day PCI  protocol.      EKG:  EKG is  ordered today.  The ekg ordered today demonstrates normal sinus rhythm, heart rate 66 bpm, consistent  with previous EKG tracings.   Recent Labs: 07/09/2021: BUN 13; Creatinine, Ser 0.98; Magnesium 2.1; Potassium 4.1; Sodium 142 04/20/2022: ALT 13; Hemoglobin 12.6; Platelets 418  Recent Lipid Panel    Component Value Date/Time   CHOL 128 04/21/2022 1649   TRIG 122 04/21/2022 1649   HDL 51 04/21/2022 1649   CHOLHDL 2.5 04/21/2022 1649   CHOLHDL 3.1 05/29/2015 1527   VLDL 17 05/29/2015 1527   LDLCALC 55 04/21/2022 1649   LDLDIRECT 129 (H) 01/01/2010 2219     Risk Assessment/Calculations:                Physical Exam:    VS:  BP 132/78   Pulse 66   Ht 5\' 2"  (1.575 m)   Wt 171 lb 6.4 oz (77.7 kg)   SpO2 95%   BMI 31.35 kg/m     Wt Readings from Last 3 Encounters:  05/20/22 171 lb 6.4 oz (77.7 kg)  04/21/22 173 lb 3.2 oz (78.6 kg)  02/11/22 175 lb (79.4 kg)     GEN:  Well nourished, well developed in no acute distress HEENT: Normal NECK: No JVD; No carotid bruits LYMPHATICS: No lymphadenopathy CARDIAC: RRR, soft murmurs, rubs, gallops RESPIRATORY:  Clear to auscultation without rales, wheezing or rhonchi  ABDOMEN: Soft, non-tender, non-distended MUSCULOSKELETAL:  No edema; No deformity  SKIN: Warm and dry NEUROLOGIC:  Alert and oriented x 3 PSYCHIATRIC:  Normal affect   ASSESSMENT:    1. Coronary artery disease involving native coronary artery of native heart without angina pectoris   2. Palpitations   3. Pure hypercholesterolemia   4. Primary hypertension   5. Mitral valve disorder    PLAN:    In order of problems listed above:  Coronary artery disease - s/P DES to ostial ramus in 2018, continue aspirin, Lipitor, Zetia, metoprolol, nitroglycerin as needed. Stable with no anginal symptoms. No indication for ischemic evaluation.    Hypertension -blood pressure today was initially elevated at 142/70, repeat 132/78.  Continue  valsartan, this was recently added by her PCP and amlodipine was discontinued.  She has a follow-up appointment with him next week for continued blood pressure management.   Hyperlipidemia -LDL on 04/21/2022 was 55, currently well-controlled, continue Zetia and atorvastatin  Palpitations -previously wore a monitor which showed predominantly normal sinus rhythm with PACs and PVCs, quiescent.  Continue metoprolol.   Mitral valve prolapse -trivial MR noted on echo in August 2022, soft murmur noted upon evaluation today.  Denies shortness of breath or heart failure symptoms.    Disposition-follow-up with Dr. Jens Som in 6 months.       Medication Adjustments/Labs and Tests Ordered: Current medicines are reviewed at length with the patient today.  Concerns regarding medicines are outlined above.  Orders Placed This Encounter  Procedures   EKG 12-Lead   No orders of the defined types were placed in this encounter.   Patient Instructions  Medication Instructions:  The current medical regimen is effective;  continue present plan and medications as directed. Please refer to the Current Medication list given to you today.  *If you need a refill on your cardiac medications before your next appointment, please call your pharmacy*  Lab Work: NONE If you have labs (blood work) drawn today and your tests are completely normal, you will receive your results only by: MyChart Message (if you have MyChart) OR A paper copy in the mail If you have any lab test that is abnormal or we need to change your  treatment, we will call you to review the results.   Testing/Procedures: NONE  Follow-Up: At [facility], you and your health needs are our priority.  As part of our continuing mission to provide you with exceptional heart care, we have created designated Provider Care Teams.  These Care Teams include your primary Cardiologist (physician) and Advanced Practice Providers (APPs -  Physician  Assistants and Nurse Practitioners) who all work together to provide you with the care you need, when you need it.  Your next appointment:   6 month(s)  Provider:   Olga Millers, MD     Other Instructions     Signed, Flossie Dibble, NP  05/20/2022 3:06 PM     [facility]

## 2022-05-20 ENCOUNTER — Ambulatory Visit: Payer: Medicare Other | Attending: General Practice | Admitting: Cardiology

## 2022-05-20 ENCOUNTER — Encounter: Payer: Self-pay | Admitting: General Practice

## 2022-05-20 VITALS — BP 132/78 | HR 66 | Ht 62.0 in | Wt 171.4 lb

## 2022-05-20 DIAGNOSIS — I2510 Atherosclerotic heart disease of native coronary artery without angina pectoris: Secondary | ICD-10-CM | POA: Diagnosis not present

## 2022-05-20 DIAGNOSIS — E7800 Pure hypercholesterolemia, unspecified: Secondary | ICD-10-CM | POA: Diagnosis not present

## 2022-05-20 DIAGNOSIS — I10 Essential (primary) hypertension: Secondary | ICD-10-CM | POA: Diagnosis not present

## 2022-05-20 DIAGNOSIS — I059 Rheumatic mitral valve disease, unspecified: Secondary | ICD-10-CM

## 2022-05-20 DIAGNOSIS — R002 Palpitations: Secondary | ICD-10-CM | POA: Diagnosis not present

## 2022-05-20 NOTE — Patient Instructions (Signed)
Medication Instructions:  The current medical regimen is effective;  continue present plan and medications as directed. Please refer to the Current Medication list given to you today.  *If you need a refill on your cardiac medications before your next appointment, please call your pharmacy*  Lab Work: NONE If you have labs (blood work) drawn today and your tests are completely normal, you will receive your results only by: [place] (if you have MyChart) OR A paper copy in the mail If you have any lab test that is abnormal or we need to change your treatment, we will call you to review the results.   Testing/Procedures: NONE  Follow-Up: At [facility], you and your health needs are our priority.  As part of our continuing mission to provide you with exceptional heart care, we have created designated Provider Care Teams.  These Care Teams include your primary Cardiologist (physician) and Advanced Practice Providers (APPs -  Physician Assistants and Nurse Practitioners) who all work together to provide you with the care you need, when you need it.  Your next appointment:   6 month(s)  Provider:   [name], MD     Other Instructions

## 2022-05-27 ENCOUNTER — Other Ambulatory Visit: Payer: Self-pay | Admitting: Podiatry

## 2022-05-29 ENCOUNTER — Encounter: Payer: Self-pay | Admitting: Student

## 2022-05-29 ENCOUNTER — Ambulatory Visit: Payer: Medicare Other | Admitting: Student

## 2022-05-29 ENCOUNTER — Other Ambulatory Visit: Payer: Self-pay

## 2022-05-29 VITALS — BP 135/70 | HR 71 | Ht 62.0 in | Wt 173.6 lb

## 2022-05-29 DIAGNOSIS — F419 Anxiety disorder, unspecified: Secondary | ICD-10-CM | POA: Diagnosis not present

## 2022-05-29 DIAGNOSIS — I2510 Atherosclerotic heart disease of native coronary artery without angina pectoris: Secondary | ICD-10-CM

## 2022-05-29 DIAGNOSIS — F32A Depression, unspecified: Secondary | ICD-10-CM

## 2022-05-29 MED ORDER — METOPROLOL SUCCINATE ER 25 MG PO TB24: 25.0000 mg | ORAL_TABLET | Freq: Every day | ORAL | 1 refills | Status: DC

## 2022-05-29 MED ORDER — SERTRALINE HCL 100 MG PO TABS: 100.0000 mg | ORAL_TABLET | Freq: Every day | ORAL | 1 refills | Status: DC

## 2022-05-29 NOTE — Progress Notes (Signed)
  SUBJECTIVE:   CHIEF COMPLAINT / HPI:   Depression f/u  Report's her mood was allover the place last week. Report's a lot of social stressors. Mood has since improved, when she has some quiet time. Was having emotional liability last week. Was taking a half a pill, and had ran out of medication for a couple of days (sertraline). Has been taking a whole pill (100 mg) and feels like it's providing better support. She note's that she's been more irritable lately and her family has been committing on it.  Denies any SI/HI.  Sleep hasn't been good, having troubles falling asleep. Appetite and interest in things that give pleasure are doing fine.    PERTINENT  PMH / PSH: Depression   OBJECTIVE:  BP 135/70   Pulse 71   Ht '5\' 2"'$  (1.575 m)   Wt 173 lb 9.6 oz (78.7 kg)   SpO2 100%   BMI 31.75 kg/m  Physical Exam Psychiatric:        Mood and Affect: Mood normal. Mood is not anxious, depressed or elated. Affect is not labile, blunt, flat, angry, tearful or inappropriate.        Speech: Speech normal.        Behavior: Behavior normal. Behavior is not agitated, slowed, aggressive, withdrawn, hyperactive or combative. Behavior is cooperative.        Thought Content: Thought content normal.      ASSESSMENT/PLAN:  Anxiety and depression Assessment & Plan: Patient report's she's been more irritable than usually w/ depressed mood. She report's family members have been commenting on her irritablillity. She also report's that she's been a little more scattered than normal and attributes it to her depression, was having a harder time focusing on task. She also appreciates her sleep has been poor, as it's hard for her to fall asleep. She scheduled the appt to discuss going up in dose on her depression medication. She had been taking it regularly, but notes she'd been off of it for 2 days, and was also noticing a difference with that. She denies any SI/HI. Patient has significant social stressors playing a  role with family members leaning on her heavily. Patient would benefit from increased dose and therapy. Screened patient for mania, given scattered thoughts and increased irritability/low sleep. She reports feeling tired often, because she's not gotten sleep, and nearly dozing off when she takes a movement to herself. She denies any impulse activities, flight of ideas, thoughts of grandiosity. Encourage patient to be aware of these symptoms and notify me if anything changes.  -Increase dose of Zoloft to 100 mg daily -Therapy resources given   Coronary artery disease involving native coronary artery of native heart without angina pectoris -     Metoprolol Succinate ER; Take 1 tablet (25 mg total) by mouth daily.  Dispense: 90 tablet; Refill: 1  Other orders -     Sertraline HCl; Take 1 tablet (100 mg total) by mouth daily.  Dispense: 90 tablet; Refill: 1   No follow-ups on file. Holley Bouche, MD 05/29/2022, 6:06 PM PGY-2, [place]

## 2022-05-29 NOTE — Patient Instructions (Signed)
It was great to see you! Thank you for allowing me to participate in your care!   Our plans for today:  - Depression:  Increasing dose of Zoloft to 100 mg daily  Follow up in 1 month  - High Blood Pressure  Refilling Blood pressure medicine   Therapy and Counseling Resources Most providers on this list will take Medicaid. Patients with commercial insurance or Medicare should contact their insurance company to get a list of in network providers.  [employer] [employer] (takes children) Location 1: [address], [place], [place] [address] Location 2: [place], [place] [address] [place] ([place] speaking therapist available)(habla espanol)(take medicare and medicaid)  [place], [place], [place] [address], [place] [email or web address] [phone]  [facility] and Counseling [address] [place], [place] [address] [place], [place], [place] [address]      [phone]  [place] (spanish available) [place], [place] [address] Center (take [facility] and medicare) [address] [place], [place] [address]       [phone]     [facility] (virtual only) [phone]  [facility] [address], [place], [place]  [facility]:  [place]. [place] [phone]  [facility]:  [place] [address] [phone]  [facility] (under & uninsured) [address], [place] [phone]    [email or web address]    [place] [address]  [place]    [phone]  [facility] [place]     Phone:  [phone]     [facility] Ambrose  [place] [address]      [place], [place] [address] [place]: [place], [place], [place]   [place] ([place] therapist) [email or web address]  [place] [address]   [place] [address]    [phone]    [facility]   [address],  [place], [place]   [place]  [place]  [facility]  [address] [place], [place]        [phone]  Open Access/Walk In Clinic under & uninsured  [facility]  [address] [place], [place] Crisis [phone]  [facility] of the [place],  ([place])   [place], [place]: [phone]) 8:30 - 12; 1 - 2:30  Family Service of the [place],  [place], [place]    [phone]):8:30 - 12; 2 - 3PM  [facility],  [address]  [place]; [phone]):   Mon - Fri 8 AM - 5 PM  [facility] [place]  MWF 12:30 to 3:00 or call to schedule an appointment  [phone]  Specific Provider options Psychology Today  [email or web address] click on find a therapist  enter your zip code left side and select or tailor a therapist for your specific need.   [facility] Provider Directory [email or web address]  (Medicaid)   Follow all drop down to find a provider  Social Support  program [place] [address]) [phone] or [email or web address] [address], [place], [place] Recovery support and educational   24- Hour Availability:   [facility]  [address] [place], [place] Crisis [phone]  Family Service of the [employer] [phone]  [place]  [phone]   [place]  [phone]  (after hours)  Therapeutic Alternative/Mobile Crisis   [phone]  [place] National Suicide Hotline  [phone] Diamantina Monks)  Call 911 or go to emergency room  [facility]  [phone]);  [place] and [employer]  [phone]); [place], [place], [place], [place], [place], [place], [place]  Take care and seek immediate care sooner if you develop any concerns.   Dr. Holley Bouche, MD [place]

## 2022-05-29 NOTE — Assessment & Plan Note (Signed)
Patient report's she's been more irritable than usually w/ depressed mood. She report's family members have been commenting on her irritablillity. She also report's that she's been a little more scattered than normal and attributes it to her depression, was having a harder time focusing on task. She also appreciates her sleep has been poor, as it's hard for her to fall asleep. She scheduled the appt to discuss going up in dose on her depression medication. She had been taking it regularly, but notes she'd been off of it for 2 days, and was also noticing a difference with that. She denies any SI/HI. Patient has significant social stressors playing a role with family members leaning on her heavily. Patient would benefit from increased dose and therapy. Screened patient for mania, given scattered thoughts and increased irritability/low sleep. She reports feeling tired often, because she's not gotten sleep, and nearly dozing off when she takes a movement to herself. She denies any impulse activities, flight of ideas, thoughts of grandiosity. Encourage patient to be aware of these symptoms and notify me if anything changes.  -Increase dose of Zoloft to 100 mg daily -Therapy resources given

## 2022-06-30 ENCOUNTER — Telehealth: Payer: Self-pay | Admitting: Student

## 2022-06-30 NOTE — Telephone Encounter (Signed)
Contacted Verl Blalock to schedule their annual wellness visit. Appointment made for 07/03/2022.  Thank you,  [place] Direct dial  [phone]

## 2022-07-03 ENCOUNTER — Ambulatory Visit (INDEPENDENT_AMBULATORY_CARE_PROVIDER_SITE_OTHER): Payer: Medicare Other

## 2022-07-03 NOTE — Progress Notes (Signed)
  I attempted to contact the patient for her scheduled Virtual Telephone Annual Wellness Visit. No answer. I left a detailed message on the patient's voicemail.   

## 2022-07-03 NOTE — Patient Instructions (Signed)

## 2022-07-13 ENCOUNTER — Encounter: Payer: Self-pay | Admitting: *Deleted

## 2022-07-13 ENCOUNTER — Telehealth: Payer: Self-pay | Admitting: *Deleted

## 2022-07-13 NOTE — Telephone Encounter (Signed)
Patient is calling to ask what she can soak her toe or advise on how to treat until she is seen in office, had wanted a sooner appointment ,didn't have anything, is on cancellation list. She had an ingrown procedure, nail has grown back splitting down the middle, causing pain, please advise.

## 2022-07-17 ENCOUNTER — Encounter: Payer: Self-pay | Admitting: Student

## 2022-07-17 ENCOUNTER — Ambulatory Visit (INDEPENDENT_AMBULATORY_CARE_PROVIDER_SITE_OTHER): Payer: Medicare Other | Admitting: Student

## 2022-07-17 VITALS — BP 156/71 | HR 59 | Ht 62.0 in | Wt 177.2 lb

## 2022-07-17 DIAGNOSIS — E139 Other specified diabetes mellitus without complications: Secondary | ICD-10-CM | POA: Diagnosis not present

## 2022-07-17 DIAGNOSIS — I10 Essential (primary) hypertension: Secondary | ICD-10-CM | POA: Diagnosis not present

## 2022-07-17 DIAGNOSIS — F32A Depression, unspecified: Secondary | ICD-10-CM | POA: Diagnosis not present

## 2022-07-17 DIAGNOSIS — F419 Anxiety disorder, unspecified: Secondary | ICD-10-CM

## 2022-07-17 MED ORDER — SERTRALINE HCL 100 MG PO TABS: 100.0000 mg | ORAL_TABLET | Freq: Every day | ORAL | 1 refills | Status: DC

## 2022-07-17 NOTE — Assessment & Plan Note (Signed)
Patient reports compliance w/ meds, however BP elevated in office today. No CP, SOB. Will have patient f/u in 2 wk for BP check. -2 wk f/u for BP check -Consider thiazide or CCB

## 2022-07-17 NOTE — Patient Instructions (Signed)
It was great to see you! Thank you for allowing me to participate in your care!  I recommend that you always bring your medications to each appointment as this makes it easy to ensure we are on the correct medications and helps [place] not miss when refills are needed.  Our plans for today:  - Ozempic  Restart at 0.25 mg for 2 wk, then increase to 0.5 mg weekly  After 4 weeks at 0.5 mg, can go up to 0.75 mg if tolerating well  If hair thinning starts again, let me know and we can discuss starting different medication in same class (Trulicity)  - Depression  Seems to be going well. Continue to take meds. Will reassess 6 months or so  - High Blood Pressure Blood pressure has been elevated. Make appointment to be seen in 1-2 weeks for Blood Pressure check/consider starting new medicine  Take care and seek immediate care sooner if you develop any concerns.   Dr. Bess Kinds, MD [facility]

## 2022-07-17 NOTE — Progress Notes (Signed)
SUBJECTIVE:   CHIEF COMPLAINT / HPI:   Depression f/u Patient zoloft incr 100 mg for increased irritability/poor sleep/low mood Reports depression is doing well and is still taking it. Irritability is improved. Sadness/depressive symptoms improved, feeling better. No SI or hallucinations.    Ozempic use Stopped taking the ozempic because she thought it was making her hair fall out/thin out. Is willing to restart as she had PRPP treatment for hair lost. Is willing to restart    PERTINENT  PMH / PSH: HTN, Depression, DM  Patient Care Team: [name] Kinds, MD as PCP - General (Family Medicine) [name] Som Madolyn Frieze, MD as PCP - Cardiology (Cardiology) [name] [name], OD (Optometry) [name] Som Madolyn Frieze, MD as Consulting Physician (Cardiology) OBJECTIVE:  BP (!) 156/71   Pulse (!) 59   Ht  (1.575 m)   Wt 177 lb 3.2 oz (80.4 kg)   SpO2 99%   BMI 32.41 kg/m  Physical Exam Constitutional:      General: She is not in acute distress.    Appearance: Normal appearance. She is not ill-appearing.  Cardiovascular:     Rate and Rhythm: Normal rate and regular rhythm.     Heart sounds: Normal heart sounds. No murmur heard.    No friction rub. No gallop.  Pulmonary:     Effort: Pulmonary effort is normal. No respiratory distress.     Breath sounds: Normal breath sounds. No stridor. No wheezing, rhonchi or rales.  Neurological:     Mental Status: She is alert.  Psychiatric:        Attention and Perception: Attention normal. She does not perceive auditory or visual hallucinations.        Mood and Affect: Affect normal. Mood is elated.        Speech: Speech normal.        Behavior: Behavior normal. Behavior is cooperative.        Thought Content: Thought content normal. Thought content does not include suicidal ideation.      ASSESSMENT/PLAN:  Essential hypertension Assessment & Plan: Patient reports compliance w/ meds, however BP elevated in office today. No CP, SOB.  Will have patient f/u in 2 wk for BP check. -2 wk f/u for BP check -Consider thiazide or CCB   Other specified diabetes mellitus without complication, without long-term current use of insulin Assessment & Plan: Patient had stopped taking Ozempic for about 1 month, 2/2 believing it was causing her hair to thin/fall out. She has received tx for her hair issue and is noting weight gain since stopping ozempic. She would like to restart, was previously on 0.5 mg q wk. Reassured no knowledge of hair thinning w/ ozempic. Patient due for DM f/u -restart ozempic 0.25 mg x 2 wk  Increase to 0.5 mg after 2 wk and hold for 4 wk  Can increase to 0.75 mg after, if tolerated well -F/u diabetes 2 wk   Anxiety and depression Assessment & Plan: Patient reports depression is doing well. Is still on the 100 mg, and notes decrease in irritability/depressive symptoms. Appetite is up, after stopping ozempic. Patient reports she feels well and has no thoughts of SI, or hallucinations. Depression seems to be well managed. Will continue dose and re-evaluate in the future. -Cont Zoloft 100 mg -F/u 3-6 months   Other orders -     Sertraline HCl; Take 1 tablet (100 mg total) by mouth daily.  Dispense: 90 tablet; Refill: 1   No follow-ups on file. [name] Kinds, MD 07/17/2022,  12:52 PM PGY-2, [facility]

## 2022-07-17 NOTE — Assessment & Plan Note (Signed)
Patient had stopped taking Ozempic for about 1 month, 2/2 believing it was causing her hair to thin/fall out. She has received tx for her hair issue and is noting weight gain since stopping ozempic. She would like to restart, was previously on 0.5 mg q wk. Reassured no knowledge of hair thinning w/ ozempic. Patient due for DM f/u -restart ozempic 0.25 mg x 2 wk  Increase to 0.5 mg after 2 wk and hold for 4 wk  Can increase to 0.75 mg after, if tolerated well -F/u diabetes 2 wk

## 2022-07-17 NOTE — Assessment & Plan Note (Signed)
Patient reports depression is doing well. Is still on the 100 mg, and notes decrease in irritability/depressive symptoms. Appetite is up, after stopping ozempic. Patient reports she feels well and has no thoughts of SI, or hallucinations. Depression seems to be well managed. Will continue dose and re-evaluate in the future. -Cont Zoloft 100 mg -F/u 3-6 months

## 2022-07-24 ENCOUNTER — Ambulatory Visit: Payer: Medicare Other | Admitting: Student

## 2022-07-24 ENCOUNTER — Encounter: Payer: Self-pay | Admitting: Student

## 2022-07-24 VITALS — BP 138/68 | HR 72 | Ht 62.0 in | Wt 173.0 lb

## 2022-07-24 DIAGNOSIS — E139 Other specified diabetes mellitus without complications: Secondary | ICD-10-CM

## 2022-07-24 DIAGNOSIS — I10 Essential (primary) hypertension: Secondary | ICD-10-CM

## 2022-07-24 LAB — POCT GLYCOSYLATED HEMOGLOBIN (HGB A1C): HbA1c, POC (controlled diabetic range): 5.8 % (ref 0.0–7.0)

## 2022-07-24 NOTE — Patient Instructions (Signed)
It was great to see you! Thank you for allowing me to participate in your care!  I recommend that you always bring your medications to each appointment as this makes it easy to ensure we are on the correct medications and helps [place] not miss when refills are needed.  Our plans for today:  - Blood pressure  Continue Metop Succ 25, Valsartan 40 mg  - DM Increase to 0.5mg  a wk x 4, then increase to 0.75 mg a wk x 4, then increase to 1 mg a week  -f/u 6 months  Take care and seek immediate care sooner if you develop any concerns.   Dr. Bess Kinds, MD [facility]

## 2022-07-24 NOTE — Progress Notes (Unsigned)
  SUBJECTIVE:   CHIEF COMPLAINT / HPI:   DM Meds: Ozempic 0.5 mg qwk  Should be on 0.25 x 2 wks then increase to 0.5 mg Checking A1c today  HTN Meds: Metop Succ 25, Valsartan 40 mg  BP at goal today, patient reports she has not taken her meds.  PERTINENT  PMH / PSH: DM, HTN   Patient Care Team: Bess Kinds, MD as PCP - General (Family Medicine) Jens Som Madolyn Frieze, MD as PCP - Cardiology (Cardiology) Liberty Handy, OD (Optometry) Lewayne Bunting, MD as Consulting Physician (Cardiology) OBJECTIVE:  BP 138/68   Pulse 72   Ht 5\' 2"  (1.575 m)   Wt 173 lb (78.5 kg)   SpO2 98%   BMI 31.64 kg/m  Physical Exam Constitutional:      General: She is not in acute distress.    Appearance: Normal appearance. She is not ill-appearing.  Cardiovascular:     Heart sounds: Normal heart sounds. No murmur heard.    No friction rub. No gallop.  Pulmonary:     Effort: Pulmonary effort is normal. No respiratory distress.     Breath sounds: Normal breath sounds. No stridor. No wheezing, rhonchi or rales.  Neurological:     Mental Status: She is alert.      ASSESSMENT/PLAN:  Other specified diabetes mellitus without complication, without long-term current use of insulin (HCC) Assessment & Plan: A1c today 5.8, patient slightly worse than previous (5.5), patient had stopped Ozempic for short period thinking it was related to hair loss. Patient no longer on other meds for DM control.  -Continue Ozempic 0.25 mg for 2 wk, then increase to 0.5 mg. 0.5 mg for 1 month than increase.  -f/u 6 months.   Orders: -     POCT glycosylated hemoglobin (Hb A1C)  Essential hypertension Assessment & Plan: Patient BP at goal today. Will CTM -F/u prn -Continue Metop Succ 25, Valsartan 40 mg     No follow-ups on file. Bess Kinds, MD 07/26/2022, 8:30 AM PGY-2, [facility]

## 2022-07-26 NOTE — Assessment & Plan Note (Signed)
Patient BP at goal today. Will CTM -F/u prn -Continue Metop Succ 25, Valsartan 40 mg

## 2022-07-26 NOTE — Assessment & Plan Note (Signed)
A1c today 5.8, patient slightly worse than previous (5.5), patient had stopped Ozempic for short period thinking it was related to hair loss. Patient no longer on other meds for DM control.  -Continue Ozempic 0.25 mg for 2 wk, then increase to 0.5 mg. 0.5 mg for 1 month than increase.  -f/u 6 months.

## 2022-07-28 ENCOUNTER — Ambulatory Visit: Payer: Medicare Other | Admitting: Podiatry

## 2022-07-28 DIAGNOSIS — Z79899 Other long term (current) drug therapy: Secondary | ICD-10-CM

## 2022-07-28 DIAGNOSIS — B351 Tinea unguium: Secondary | ICD-10-CM | POA: Diagnosis not present

## 2022-07-28 NOTE — Progress Notes (Signed)
Subjective: Chief Complaint  Patient presents with   Nail Problem    Rm 11 Right great hallux nail problem. Pt states her nail started splitting down the middle of her nail for 1 month. Occasional soreness to nail bed.     72 year old female presents the office for above concerns.  She is a nail starting a splint at the tip of the right big toenail she wants to have it checked and trimmed as she was nervous to do it herself. No redness or drainage.  No pain. She is tolerating the Lamisil well.   Objective: AAO x3, NAD DP/PT pulses palpable bilaterally, CRT less than 3 seconds Right hallux nail is hypertrophic and dystrophic and there is a mild vertical split on the central aspect of the nail.  There is no edema no erythema, drainage or pus or any signs of infection.  No open lesions. There is clearing of the proximal nail folds.  No pain with calf compression, swelling, warmth, erythema  Assessment: 72 year old female with onychodystrophy  Plan: -All treatment options discussed with the patient including all alternatives, risks, complications.  -Debrided the nail with any complications or bleeding. She is tolerating the Lamisil well and we will continue for an additional 30 days. Continue to monitor for side affects.  -Patient encouraged to call the office with any questions, concerns, change in symptoms.   Vivi Barrack DPM

## 2022-07-28 NOTE — Patient Instructions (Signed)
Terbinafine Tablets What is this medication? TERBINAFINE (TER bin a feen) treats fungal infections of the nails. It belongs to a group of medications called antifungals. It will not treat infections caused by bacteria or viruses. This medicine may be used for other purposes; ask your health care provider or pharmacist if you have questions. COMMON BRAND NAME(S): Lamisil, Terbinex What should I tell my care team before I take this medication? They need to know if you have any of these conditions: Liver disease An unusual or allergic reaction to terbinafine, other medications, foods, dyes, or preservatives Pregnant or trying to get pregnant Breast-feeding How should I use this medication? Take this medication by mouth with water. Take it as directed on the prescription label at the same time every day. You can take it with or without food. If it upsets your stomach, take it with food. Keep taking it unless your care team tells you to stop. A special MedGuide will be given to you by the pharmacist with each prescription and refill. Be sure to read this information carefully each time. Talk to your care team regarding the use of this medication in children. Special care may be needed. Overdosage: If you think you have taken too much of this medicine contact a poison control center or emergency room at once. NOTE: This medicine is only for you. Do not share this medicine with others. What if I miss a dose? If you miss a dose, take it as soon as you can unless it is more than 4 hours late. If it is more than 4 hours late, skip the missed dose. Take the next dose at the normal time. What may interact with this medication? Do not take this medication with any of the following: Pimozide Thioridazine This medication may also interact with the following: Beta blockers Caffeine Certain medications for mental health conditions Cimetidine Cyclosporine Medications for fungal infections like fluconazole  and ketoconazole Medications for irregular heartbeat like amiodarone, flecainide and propafenone Rifampin Warfarin This list may not describe all possible interactions. Give your health care provider a list of all the medicines, herbs, non-prescription drugs, or dietary supplements you use. Also tell them if you smoke, drink alcohol, or use illegal drugs. Some items may interact with your medicine. What should I watch for while using this medication? Visit your care team for regular checks on your progress. You may need blood work while you are taking this medication. It may be some time before you see the benefit from this medication. This medication may cause serious skin reactions. They can happen weeks to months after starting the medication. Contact your care team right away if you notice fevers or flu-like symptoms with a rash. The rash may be red or purple and then turn into blisters or peeling of the skin. Or, you might notice a red rash with swelling of the face, lips or lymph nodes in your neck or under your arms. This medication can make you more sensitive to the sun. Keep out of the sun, If you cannot avoid being in the sun, wear protective clothing and sunscreen. Do not use sun lamps or tanning beds/booths. What side effects may I notice from receiving this medication? Side effects that you should report to your care team as soon as possible: Allergic reactions--skin rash, itching, hives, swelling of the face, lips, tongue, or throat Change in sense of smell Change in taste Infection--fever, chills, cough, or sore throat Liver injury--right upper belly pain, loss of appetite, nausea,   light-colored stool, dark yellow or brown urine, yellowing skin or eyes, unusual weakness or fatigue Low red blood cell level--unusual weakness or fatigue, dizziness, headache, trouble breathing Lupus-like syndrome--joint pain, swelling, or stiffness, butterfly-shaped rash on the face, rashes that get worse  in the sun, fever, unusual weakness or fatigue Rash, fever, and swollen lymph nodes Redness, blistering, peeling, or loosening of the skin, including inside the mouth Unusual bruising or bleeding Worsening mood, feelings of depression Side effects that usually do not require medical attention (report to your care team if they continue or are bothersome): Diarrhea Gas Headache Nausea Stomach pain Upset stomach This list may not describe all possible side effects. Call your doctor for medical advice about side effects. You may report side effects to FDA at 1-800-FDA-1088. Where should I keep my medication? Keep out of the reach of children and pets. Store between 20 and 25 degrees C (68 and 77 degrees F). Protect from light. Get rid of any unused medication after the expiration date. To get rid of medications that are no longer needed or have expired: Take the medication to a medication take-back program. Check with your pharmacy or law enforcement to find a location. If you cannot return the medication, check the label or package insert to see if the medication should be thrown out in the garbage or flushed down the toilet. If you are not sure, ask your care team. If it is safe to put it in the trash, take the medication out of the container. Mix the medication with cat litter, dirt, coffee grounds, or other unwanted substance. Seal the mixture in a bag or container. Put it in the trash. NOTE: This sheet is a summary. It may not cover all possible information. If you have questions about this medicine, talk to your doctor, pharmacist, or health care provider.  2023 Elsevier/Gold Standard (2020-10-08 00:00:00)  

## 2022-07-29 ENCOUNTER — Other Ambulatory Visit: Payer: Self-pay | Admitting: Podiatry

## 2022-07-29 LAB — CBC WITH DIFFERENTIAL/PLATELET
Basophils Absolute: 0.1 10*3/uL (ref 0.0–0.2)
Basos: 1 %
EOS (ABSOLUTE): 0.4 10*3/uL (ref 0.0–0.4)
Eos: 6 %
Hematocrit: 40.4 % (ref 34.0–46.6)
Hemoglobin: 12.4 g/dL (ref 11.1–15.9)
Immature Grans (Abs): 0 10*3/uL (ref 0.0–0.1)
Immature Granulocytes: 0 %
Lymphocytes Absolute: 2.8 10*3/uL (ref 0.7–3.1)
Lymphs: 40 %
MCH: 22.5 pg — ABNORMAL LOW (ref 26.6–33.0)
MCHC: 30.7 g/dL — ABNORMAL LOW (ref 31.5–35.7)
MCV: 73 fL — ABNORMAL LOW (ref 79–97)
Monocytes Absolute: 0.3 10*3/uL (ref 0.1–0.9)
Monocytes: 5 %
Neutrophils Absolute: 3.3 10*3/uL (ref 1.4–7.0)
Neutrophils: 48 %
Platelets: 376 10*3/uL (ref 150–450)
RBC: 5.51 x10E6/uL — ABNORMAL HIGH (ref 3.77–5.28)
RDW: 15.3 % (ref 11.7–15.4)
WBC: 6.9 10*3/uL (ref 3.4–10.8)

## 2022-07-29 LAB — HEPATIC FUNCTION PANEL
ALT: 14 IU/L (ref 0–32)
AST: 13 IU/L (ref 0–40)
Albumin: 4.3 g/dL (ref 3.8–4.8)
Alkaline Phosphatase: 69 IU/L (ref 44–121)
Bilirubin Total: 0.6 mg/dL (ref 0.0–1.2)
Bilirubin, Direct: 0.14 mg/dL (ref 0.00–0.40)
Total Protein: 6.8 g/dL (ref 6.0–8.5)

## 2022-07-29 MED ORDER — TERBINAFINE HCL 250 MG PO TABS: 250.0000 mg | ORAL_TABLET | Freq: Every day | ORAL | 0 refills | Status: AC

## 2022-09-04 ENCOUNTER — Telehealth: Payer: Self-pay

## 2022-09-04 NOTE — Telephone Encounter (Signed)
Attempted to contact to discuss Ozempic tolerability. Left voicemail for patient to reach out at her earliest convenience.  Irish Elders, PharmD PGY-1 [facility] Resident

## 2022-09-16 ENCOUNTER — Telehealth: Payer: Self-pay | Admitting: Pharmacist

## 2022-09-16 NOTE — Telephone Encounter (Signed)
-----   Message from [name], RPH-CPP sent at 09/03/2022  3:24 PM EDT ----- Hi guys,   This patient was misattributed and was on one of my clinic adherence lists. I added her back at the bottom of the [facility] May Adherence report.   Looks like could be a good one - you've worked with her, Consulting civil engineer. Appears she was worried about Ozempic causing her hair to fall out, and she talked with Dr. Barbaraann Faster about this. He thought she was going to restart, her last fill date appears to be 4/18 for a 30 day supply- which obviously would last her longer if she re-titrated!  [name] [name]

## 2022-09-16 NOTE — Telephone Encounter (Signed)
Contacted patient RE tolerability of her Ozempic (semaglutide).   She reports taking this medication currently at 0.5mg  weekly.  She restarted after she had concern for hair loss.  After seeing Dr. Barbaraann Faster she has restarted this medication.   She plans to see Dr. Barbaraann Faster again in the nxt 3 months (has not scheduled appointment at this time).   We discussed potential dose increase to higher dose Ozempic at future visit to help improve satiety and weight loss which has been minimal impact at this time.   Reports blood sugars are all in the 100s.   Appears adherent with therapy for Diabetes at this time.

## 2022-10-28 ENCOUNTER — Ambulatory Visit: Payer: Medicare Other | Admitting: Pharmacist

## 2022-10-28 ENCOUNTER — Encounter: Payer: Self-pay | Admitting: Pharmacist

## 2022-10-28 VITALS — BP 123/56 | HR 53 | Ht 62.0 in | Wt 175.6 lb

## 2022-10-28 DIAGNOSIS — E139 Other specified diabetes mellitus without complications: Secondary | ICD-10-CM

## 2022-10-28 MED ORDER — SEMAGLUTIDE (1 MG/DOSE) 4 MG/3ML ~~LOC~~ SOPN: 0.5000 mg | PEN_INJECTOR | SUBCUTANEOUS | Status: DC

## 2022-10-28 MED ORDER — PANTOPRAZOLE SODIUM 40 MG PO TBEC: 40.0000 mg | DELAYED_RELEASE_TABLET | Freq: Two times a day (BID) | ORAL | 3 refills | Status: DC

## 2022-10-28 NOTE — Progress Notes (Signed)
S:     Chief Complaint  Patient presents with   Medication Management    diabetes   72 y.o. female who presents for diabetes evaluation, education, and management. Patient arrives in  good spirits and presents without any assistance.   Patient was referred and last seen by Primary Care Provider, Dr. Barbaraann Faster, on 07/24/2022.   PMH is significant for GERD, Hypertension, Diabetes.  At last contact we discussed reinitiating Ozempic (semaglutide).  Patient has resumed and is taking 0.5mg  weekly.  She reports she is likely going to be unable to afford this as she has reached the "coverage gap".     Current diabetes medications include: Ozempic (semaglutide) 0.5 mg weekly  Current hypertension medications include: metoprolol succ 25 mg daily and valsartan 40mg  daily Current hyperlipidemia medications include: atorvastatin 80 mg, ezetimibe 10 mg  Patient reports adherence to taking all medications as prescribed.    Do you feel that your medications are working for you? yes Have you been experiencing any side effects to the medications prescribed? no Do you have any problems obtaining medications due to transportation or finances? Yes Insurance coverage: [employer] - coverage gap currently.   Patient denies hypoglycemic events.  Reported home fasting blood sugars: well controlled.     O:   Review of Systems  All other systems reviewed and are negative.   Physical Exam Vitals reviewed.  Constitutional:      Appearance: Normal appearance.  Pulmonary:     Effort: Pulmonary effort is normal.  Neurological:     Mental Status: She is alert.  Psychiatric:        Mood and Affect: Mood normal.        Behavior: Behavior normal.        Thought Content: Thought content normal.        Judgment: Judgment normal.     Lab Results  Component Value Date   HGBA1C 5.8 07/24/2022   Vitals:   10/28/22 1039  BP: (!) 123/56  Pulse: (!) 53    Lipid Panel     Component Value Date/Time    CHOL 128 04/21/2022 1649   TRIG 122 04/21/2022 1649   HDL 51 04/21/2022 1649   CHOLHDL 2.5 04/21/2022 1649   CHOLHDL 3.1 05/29/2015 1527   VLDL 17 05/29/2015 1527   LDLCALC 55 04/21/2022 1649   LDLDIRECT 129 (H) 01/01/2010 2219    Patient is participating in a Managed Medicaid Plan: No but asked to pursue application/inquiry - provided instructions for Medicaid - same day enrollment in [facility].  A/P: Diabetes longstanding currently in excellent control based on home glucose readings and most recent A1c. Patient desires more weight loss with use of Ozempic (semaglutide).  She inquires about alternatives and anticipates being unable to afford her treatments while she is in the coverage gap. Patient is  able to verbalize appropriate hypoglycemia management plan. Medication adherence appears good at this time. Future control may be suboptimal without medication assistance.  -Increased dose of GLP-1 Ozempic (semaglutide) following final two doses of 0.5mg  in her current pen. Provided patient with dose escalation schedule to increase dose while attempting to minimize side effects.    -Asked patient to pursue Medicaid, THEN Low-income-subsidy and finally work with Siri Cole, CPhT to assist with longer-term supply of Ozempic.  -Today, supplied medication samples to the patient. Drug name: Ozempic (semaglutide)  Strength: 1mg  pens        Qty: 2 pens  LOT: [id number]  Exp.Date: 12/27/2024 Dosing  instructions: Escalating dose as directed.  The patient has been instructed regarding the correct time, dose, and frequency of taking this medication, including desired effects and most common side effects.    Hypertension longstanding currently controlled. Blood pressure goal of <130  mmHg. Medication adherence good. -Continued valsartan 40mg  daily and metoprolol 25mg  daily.   Written patient instructions provided. Patient verbalized understanding of treatment plan.  Total time in face to face  counseling 22 minutes.    Follow-up:  Pharmacist PRN PCP clinic visit in ~ 1 month

## 2022-10-28 NOTE — Addendum Note (Signed)
Addended by: Kathrin Ruddy on: 10/28/2022 02:17 PM   Modules accepted: Orders

## 2022-10-28 NOTE — Assessment & Plan Note (Signed)
Diabetes longstanding currently in excellent control based on home glucose readings and most recent A1c. Patient desires more weight loss with use of Ozempic (semaglutide).  She inquires about alternatives and anticipates being unable to afford her treatments while she is in the coverage gap. Patient is  able to verbalize appropriate hypoglycemia management plan. Medication adherence appears good at this time. Future control may be suboptimal without medication assistance.  -Increased dose of GLP-1 Ozempic (semaglutide) following final two doses of 0.5mg  in her current pen. Provided patient with dose escalation schedule to increase dose while attempting to minimize side effects.    -Asked patient to pursue Medicaid, THEN Low-income-subsidy and finally work with Siri Cole, CPhT to assist with longer-term supply of Ozempic.

## 2022-10-28 NOTE — Patient Instructions (Signed)
It was nice to see you today!  Your goal blood sugar is 80-130 before eating and less than 180 after eating.  Medication Changes: Continue Ozempic 0.5mg  weekly until you finish current pen.   In ~ 3 weeks, Start Ozempic 1mg  pen at 45 clicks week #1 Then 50 clicks 55 clicks 60 clicks 65 clicks   Continue all other medication the same.   Keep up the good work with diet and exercise. Aim for a diet full of vegetables, fruit and lean meats (chicken, [place], fish). Try to limit salt intake by eating fresh or frozen vegetables (instead of canned), rinse canned vegetables prior to cooking and do not add any additional salt to meals.

## 2022-10-30 NOTE — Progress Notes (Signed)
Reviewed and agree with Dr Koval's plan.   

## 2022-11-06 ENCOUNTER — Other Ambulatory Visit (HOSPITAL_COMMUNITY): Payer: Self-pay

## 2022-11-06 ENCOUNTER — Telehealth: Payer: Self-pay

## 2022-11-06 NOTE — Telephone Encounter (Signed)
PAP: Application for Ozempic has been submitted to PAP Companies: [employer], Therapist, sports

## 2022-11-29 NOTE — Progress Notes (Deleted)
Cardiology Clinic Note   Patient Name: [name] [name] Date of Encounter: 11/29/2022  Primary Care Provider:  Bess Kinds, MD Primary Cardiologist:  Olga Millers, MD  Patient Profile    72 year old female with history of coronary artery disease status post drug-eluting stent to the ostial ramus in 2018 due to 80% ostial Ramus, hypertension, hyperlipidemia, orthostatic hypotension, with noncardiac history to include diabetes, GERD, anxiety and depression.  Past Medical History    Past Medical History:  Diagnosis Date   Arthritis    Blood transfusion without reported diagnosis    Cataract    Coronary artery disease    Depression    Hyperlipidemia    Hypertension    Microcytic anemia    Mitral valve prolapse    Past Surgical History:  Procedure Laterality Date   ABDOMINAL HYSTERECTOMY     CARDIAC CATHETERIZATION     CATARACT EXTRACTION, BILATERAL     COLONOSCOPY     greater than 10 years ago   CORONARY PRESSURE/FFR STUDY N/A 03/01/2017   Procedure: INTRAVASCULAR PRESSURE WIRE/FFR STUDY;  Surgeon: Kathleene Hazel, MD;  [facility]: [facility];  Service: Cardiovascular;  Laterality: N/A;   CORONARY STENT INTERVENTION N/A 03/01/2017   Procedure: CORONARY STENT INTERVENTION;  Surgeon: Kathleene Hazel, MD;  Location: [facility];  Service: Cardiovascular;  Laterality: N/A;   LEFT HEART CATH AND CORONARY ANGIOGRAPHY N/A 03/01/2017   Procedure: LEFT HEART CATH AND CORONARY ANGIOGRAPHY;  Surgeon: Kathleene Hazel, MD;  [facility]: [facility];  Service: Cardiovascular;  Laterality: N/A;   PARTIAL HYSTERECTOMY      Allergies  No Known Allergies  History of Present Illness    [name] [name] returns to the office today for ongoing assessment and management of coronary artery disease, hypertension, hyperlipidemia, palpitations, history of mitral valve prolapse with trivial MR.  Last seen in the office by Wallis Bamberg, DNP, on 05/20/2022.   She was stable from a cardiac standpoint but did continues to have some palpitations.  Home Medications    Current Outpatient Medications  Medication Sig Dispense Refill   ammonium lactate (LAC-HYDRIN) 12 % lotion APPLY 1 APPLICATION TOPICALLY AS NEEDED FOR DRY SKIN. 400 mL 1   aspirin 81 MG tablet Take 81 mg by mouth daily.     atorvastatin (LIPITOR) 80 MG tablet Take 1 tablet (80 mg total) by mouth daily. 90 tablet 3   conjugated estrogens (PREMARIN) vaginal cream Place 1 Applicatorful vaginally daily. 42.5 g 12   cycloSPORINE (RESTASIS) 0.05 % ophthalmic emulsion 1 drop 2 (two) times daily. Uses PRN     ezetimibe (ZETIA) 10 MG tablet Take 1 tablet (10 mg total) by mouth daily. 90 tablet 3   ferrous sulfate 325 (65 FE) MG tablet TAKE 1 TABLET BY MOUTH EVERY DAY WITH BREAKFAST 90 tablet 1   fluticasone (FLONASE) 50 MCG/ACT nasal spray Place 2 sprays into both nostrils daily. 16 g 4   gabapentin (NEURONTIN) 100 MG capsule Take 1 capsule (100 mg total) by mouth every morning AND 1 capsule (100 mg total) daily with lunch AND 2 capsules (200 mg total) at bedtime. 120 capsule 3   metoprolol succinate (TOPROL-XL) 25 MG 24 hr tablet Take 1 tablet (25 mg total) by mouth daily. 90 tablet 1   nitroGLYCERIN (NITROSTAT) 0.4 MG SL tablet DISSOLVE 1 TABLET UNDER THE TONGUE EVERY 5 MINUTES AS NEEDED FOR CHEST PAIN. DO NOT EXCEED A TOTAL OF 3 DOSES IN 15 MINUTES. (ORIG) 25 tablet 1  pantoprazole (PROTONIX) 40 MG tablet Take 1 tablet (40 mg total) by mouth 2 (two) times daily. 180 tablet 3   Polyethyl Glycol-Propyl Glycol (SYSTANE) 0.4-0.3 % GEL ophthalmic gel Place 1 Application into both eyes.     polyethylene glycol powder (GLYCOLAX/MIRALAX) 17 GM/SCOOP powder Take 17 g by mouth 2 (two) times daily as needed. 3350 g 1   Semaglutide, 1 MG/DOSE, 4 MG/3ML SOPN Inject 0.5-1 mg into the skin once a week.     sertraline (ZOLOFT) 100 MG tablet Take 1 tablet (100 mg total) by mouth daily. 90 tablet 1   terbinafine  (LAMISIL) 250 MG tablet Take 1 tablet (250 mg total) by mouth daily. 30 tablet 0   valsartan (DIOVAN) 40 MG tablet Take 1 tablet (40 mg total) by mouth daily. 90 tablet 3   Current Facility-Administered Medications  Medication Dose Route Frequency Provider Last Rate Last Admin   diclofenac Sodium (VOLTAREN) 1 % topical gel 2 g  2 g Topical PRN Bess Kinds, MD         Family History    Family History  Problem Relation Age of Onset   Heart disease Mother        CHF   Diabetes Mother    Hypertension Mother    Cancer Father    Hypertension Sister    Pancreatitis Sister    Dementia Sister    Esophageal cancer Sister    Esophageal cancer Brother    Colon cancer Neg Hx    Stomach cancer Neg Hx    Rectal cancer Neg Hx    She indicated that her mother is alive. She indicated that her father is deceased. She indicated that her sister is alive. She indicated that the status of her brother is unknown. She indicated that all of her three daughters are alive. She indicated that the status of her neg hx is unknown.  Social History    Social History   Socioeconomic History   Marital status: Married    Spouse name: Adam   Number of children: 3   Years of education: 12   Highest education level: High school graduate  Occupational History    Comment: Retired  Tobacco Use   Smoking status: Former    Current packs/day: 0.00    Average packs/day: 0.5 packs/day for 20.0 years (10.0 ttl pk-yrs)    Types: Cigarettes    Start date: 07/30/1984    Quit date: 07/30/2004    Years since quitting: 18.3   Smokeless tobacco: Never  Vaping Use   Vaping status: Never Used  Substance and Sexual Activity   Alcohol use: Yes    Alcohol/week: 0.0 standard drinks of alcohol    Comment: Occasional   Drug use: No   Sexual activity: Yes    Partners: Male    Birth control/protection: Post-menopausal  Other Topics Concern   Not on file  Social History Narrative   Patient lives in [place] with her  husband and niece.   Patient has 3 daughters she is close with. One lives right next door.    Patient enjoys sewing, cooking, reading, and outdoor activities.    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    Social Determinants of Health   Financial Resource Strain: Low Risk  (04/21/2019)   Overall Financial Resource Strain (CARDIA)    Difficulty of Paying Living Expenses: Not hard at all  Food Insecurity: No Food Insecurity (04/21/2019)   Hunger Vital Sign  Worried About Programme researcher, broadcasting/film/video in the Last Year: Never true    Ran Out of Food in the Last Year: Never true  Transportation Needs: No Transportation Needs (04/21/2019)   PRAPARE - Administrator, Civil Service (Medical): No    Lack of Transportation (Non-Medical): No  Physical Activity: Sufficiently Active (04/21/2019)   Exercise Vital Sign    Days of Exercise per Week: 3 days    Minutes of Exercise per Session: 60 min  Stress: No Stress Concern Present (04/21/2019)   [employer] of Occupational Health - Occupational Stress Questionnaire    Feeling of Stress : Only a little  Social Connections: Socially Integrated (04/21/2019)   Social Connection and Isolation Panel [NHANES]    Frequency of Communication with Friends and Family: More than three times a week    Frequency of Social Gatherings with Friends and Family: More than three times a week    Attends Religious Services: More than 4 times per year    Active Member of [employer] or Organizations: Yes    Attends Engineer, structural: More than 4 times per year    Marital Status: Married  Catering manager Violence: Not At Risk (04/21/2019)   Humiliation, Afraid, Rape, and Kick questionnaire    Fear of Current or Ex-Partner: No    Emotionally Abused: No    Physically Abused: No    Sexually Abused: No     Review of Systems    General:  No chills, fever, night sweats or weight changes.  Cardiovascular:  No chest pain, dyspnea  on exertion, edema, orthopnea, palpitations, paroxysmal nocturnal dyspnea. Dermatological: No rash, lesions/masses Respiratory: No cough, dyspnea Urologic: No hematuria, dysuria Abdominal:   No nausea, vomiting, diarrhea, bright red blood per rectum, melena, or hematemesis Neurologic:  No visual changes, wkns, changes in mental status. All other systems reviewed and are otherwise negative except as noted above.       Physical Exam    VS:  There were no vitals taken for this visit. , BMI There is no height or weight on file to calculate BMI.     GEN: Well nourished, well developed, in no acute distress. HEENT: normal. Neck: Supple, no JVD, carotid bruits, or masses. Cardiac: RRR, no murmurs, rubs, or gallops. No clubbing, cyanosis, edema.  Radials/DP/PT 2+ and equal bilaterally.  Respiratory:  Respirations regular and unlabored, clear to auscultation bilaterally. GI: Soft, nontender, nondistended, BS + x 4. MS: no deformity or atrophy. Skin: warm and dry, no rash. Neuro:  Strength and sensation are intact. Psych: Normal affect.      Lab Results  Component Value Date   WBC 6.9 07/28/2022   HGB 12.4 07/28/2022   HCT 40.4 07/28/2022   MCV 73 (L) 07/28/2022   PLT 376 07/28/2022   Lab Results  Component Value Date   CREATININE 0.98 07/09/2021   BUN 13 07/09/2021   NA 142 07/09/2021   K 4.1 07/09/2021   CL 106 07/09/2021   CO2 24 07/09/2021   Lab Results  Component Value Date   ALT 14 07/28/2022   AST 13 07/28/2022   ALKPHOS 69 07/28/2022   BILITOT 0.6 07/28/2022   Lab Results  Component Value Date   CHOL 128 04/21/2022   HDL 51 04/21/2022   LDLCALC 55 04/21/2022   LDLDIRECT 129 (H) 01/01/2010   TRIG 122 04/21/2022   CHOLHDL 2.5 04/21/2022    Lab Results  Component Value Date   HGBA1C 5.8 07/24/2022  Review of Prior Studies    04/29/21 Carotid US - mild, bilateral stenosis   11/07/20 echo complete - EF of 60 to 65%, no RWMA, trivial MR, mild aortic valve  sclerosis without evidence of stenosis.   11/11/20 monitor - Sinus bradycardia, normal sinus rhythm, sinus tachycardia, occasional PAC, brief PAT, rare PVC   03/01/2017 LHC -  Mid RCA lesion is 20% stenosed. Ost Ramus lesion is 80% stenosed. A drug-eluting stent was successfully placed using a STENT PROMUS PREM MR 3.0X12. Post intervention, there is a 0% residual stenosis. The left ventricular systolic function is normal. LV end diastolic pressure is normal. The left ventricular ejection fraction is 50-55% by visual estimate. There is no mitral valve regurgitation.   1. Unstable angina 2. Severe stenosis ostium of the Ramus Intermediate branch 3. Successful PTCA/DES x 1 ostium of the intermediate branch   Recommendations: DAPT with ASA and Plavix for one year.   Assessment & Plan   1.  ***     {Are you ordering a CV Procedure (e.g. stress test, cath, DCCV, TEE, etc)?   Press F2        :[id number]}   Signed, Bettey Mare. Liborio Nixon, ANP, AACC   11/29/2022 6:17 PM      Office [phone] Fax [phone]  Notice: This dictation was prepared with Dragon dictation along with smaller phrase technology. Any transcriptional errors that result from this process are unintentional and may not be corrected upon review.

## 2022-12-01 ENCOUNTER — Ambulatory Visit: Payer: Medicare Other | Attending: Cardiology | Admitting: Adult Health

## 2022-12-01 ENCOUNTER — Ambulatory Visit: Payer: Medicare Other | Admitting: Cardiology

## 2022-12-02 NOTE — Telephone Encounter (Addendum)
PAP: Patient assistance application for Ozempic has been approved by PAP Companies: [employer] from 12/02/2022 to 03/30/2023. Medication should be delivered to PAP Delivery: Provider's office For further shipping updates, please contact [employer] at [phone] Pt ID is: [id number]

## 2022-12-04 ENCOUNTER — Ambulatory Visit: Payer: Medicare Other | Admitting: Student

## 2022-12-04 NOTE — Progress Notes (Deleted)
  SUBJECTIVE:   CHIEF COMPLAINT / HPI:   DM Lab Results  Component Value Date   HGBA1C 5.8 07/24/2022  Meds: Ozempic 1 mg qwk  HTN BP Readings from Last 3 Encounters:  10/28/22 (!) 123/56  07/24/22 138/68  07/17/22 (!) 156/71  Meds:  metoprolol succ 25 mg daily and valsartan 40mg  daily   HLD Lab Results  Component Value Date   CHOL 128 04/21/2022   HDL 51 04/21/2022   LDLCALC 55 04/21/2022   LDLDIRECT 129 (H) 01/01/2010   TRIG 122 04/21/2022   CHOLHDL 2.5 04/21/2022  Meds:atorvastatin 80 mg, ezetimibe 10 mg   CAD Last seen ?  HM Colonocopy Rpt due 07/10/23  PERTINENT  PMH / PSH: ***  Past Medical History:  Diagnosis Date   Arthritis    Blood transfusion without reported diagnosis    Cataract    Coronary artery disease    Depression    Hyperlipidemia    Hypertension    Microcytic anemia    Mitral valve prolapse    OBJECTIVE:  There were no vitals taken for this visit. Physical Exam   ASSESSMENT/PLAN:  There are no diagnoses linked to this encounter. No follow-ups on file. Bess Kinds, MD 12/04/2022, 8:01 AM PGY-***, [facility] {    This will disappear when note is signed, click to select method of visit    :1}

## 2022-12-04 NOTE — Telephone Encounter (Signed)
This was approved for 0.5mg , faxing reorder form to [employer] now

## 2022-12-17 ENCOUNTER — Other Ambulatory Visit: Payer: Self-pay

## 2022-12-17 ENCOUNTER — Telehealth: Payer: Self-pay | Admitting: Pharmacist

## 2022-12-17 NOTE — Telephone Encounter (Signed)
Contacted patient RE [employer] supply of Ozempic (semaglutide) 1mg   After call to Medication assistance program - they shared that the application was received, was in progress and the anticipated dispense/delivery date was ~ 9/30 or 10/1  Contacted patient and shared above detail.  She thanked for the the update.

## 2022-12-22 ENCOUNTER — Telehealth: Payer: Self-pay

## 2022-12-22 NOTE — Telephone Encounter (Signed)
Informed patient her novo nordisk shipment is ready for pickup.  4 boxes of ozempic 1mg  dose pens are labeled and ready in med room fridge.

## 2022-12-24 ENCOUNTER — Other Ambulatory Visit: Payer: Self-pay

## 2022-12-30 ENCOUNTER — Other Ambulatory Visit: Payer: Self-pay

## 2022-12-31 ENCOUNTER — Other Ambulatory Visit: Payer: Self-pay

## 2022-12-31 DIAGNOSIS — G63 Polyneuropathy in diseases classified elsewhere: Secondary | ICD-10-CM

## 2023-01-01 MED ORDER — GABAPENTIN 100 MG PO CAPS: ORAL_CAPSULE | ORAL | 3 refills | Status: DC

## 2023-01-04 ENCOUNTER — Other Ambulatory Visit: Payer: Self-pay

## 2023-01-04 ENCOUNTER — Encounter: Payer: Self-pay | Admitting: Student

## 2023-01-04 ENCOUNTER — Ambulatory Visit: Payer: Medicare Other | Admitting: Student

## 2023-01-04 VITALS — BP 175/75 | HR 68 | Ht 62.0 in | Wt 175.0 lb

## 2023-01-04 DIAGNOSIS — I10 Essential (primary) hypertension: Secondary | ICD-10-CM | POA: Diagnosis not present

## 2023-01-04 DIAGNOSIS — E139 Other specified diabetes mellitus without complications: Secondary | ICD-10-CM

## 2023-01-04 DIAGNOSIS — R252 Cramp and spasm: Secondary | ICD-10-CM

## 2023-01-04 LAB — POCT GLYCOSYLATED HEMOGLOBIN (HGB A1C): HbA1c, POC (prediabetic range): 5.8 % (ref 5.7–6.4)

## 2023-01-04 NOTE — Progress Notes (Unsigned)
  SUBJECTIVE:   CHIEF COMPLAINT / HPI:   F/u DM Meds: Semagulatide 0.5 mg q wk? A1c: 5.8   HTN Meds: Valsartan 40 Elevated, but hasn't taken today, takes BP med in evening. Also not taking her meds on the regular because of all the social activities she's been doing to take care of her mom.    Tightness in legs Been an issue for last 1 month or so. Notes it's a tightness behind her legs, pulling against her thighs/down from everywhere. No pain or weakness.   Lab Results  Component Value Date   HGBA1C 5.8 07/24/2022     PERTINENT  PMH / PSH: ***  Past Medical History:  Diagnosis Date   Arthritis    Blood transfusion without reported diagnosis    Cataract    Coronary artery disease    Depression    Hyperlipidemia    Hypertension    Microcytic anemia    Mitral valve prolapse    OBJECTIVE:  There were no vitals taken for this visit. Physical Exam   ASSESSMENT/PLAN:  There are no diagnoses linked to this encounter. No follow-ups on file. Bess Kinds, MD 01/04/2023, 12:30 PM PGY-***, [facility] {    This will disappear when note is signed, click to select method of visit    :1}

## 2023-01-04 NOTE — Patient Instructions (Addendum)
It was great to see you! Thank you for allowing me to participate in your care!  I recommend that you always bring your medications to each appointment as this makes it easy to ensure we are on the correct medications and helps [place] not miss when refills are needed.  Our plans for today:  - Diabetes  Your diabetes is doing well! We will increase Ozempic to 1 mg every week  - High Blood Pressure Start taking your blood pressure medicine in the morning, set an alarm on your phone to remind you, everyday.   Make a follow up appointment in 2 weeks to recheck your Blood Pressure  - Leg tightness Look for exercises online for "hamstring tightness" to help relive this. Can also use Icy Hot/Ben Gay to help with symptoms. Can also using heating pads as needed.  Checking electrolytes today  * IF not getting better, or getting worse, make a follow up appointment and we will consider Physical Therapy  We are checking some labs today, I will call you if they are abnormal will send you a MyChart message or a letter if they are normal.  If you do not hear about your labs in the next 2 weeks please let us know.  Take care and seek immediate care sooner if you develop any concerns.   Dr. Bess Kinds, MD [facility]

## 2023-01-05 ENCOUNTER — Encounter: Payer: Self-pay | Admitting: Student

## 2023-01-05 LAB — BASIC METABOLIC PANEL
BUN/Creatinine Ratio: 11 — ABNORMAL LOW (ref 12–28)
BUN: 11 mg/dL (ref 8–27)
CO2: 22 mmol/L (ref 20–29)
Calcium: 9.5 mg/dL (ref 8.7–10.3)
Chloride: 104 mmol/L (ref 96–106)
Creatinine, Ser: 0.97 mg/dL (ref 0.57–1.00)
Glucose: 74 mg/dL (ref 70–99)
Potassium: 4.3 mmol/L (ref 3.5–5.2)
Sodium: 142 mmol/L (ref 134–144)
eGFR: 62 mL/min/{1.73_m2} (ref >59)

## 2023-01-06 MED ORDER — SEMAGLUTIDE (1 MG/DOSE) 4 MG/3ML ~~LOC~~ SOPN: 1.0000 mg | PEN_INJECTOR | SUBCUTANEOUS | Status: DC

## 2023-01-06 NOTE — Assessment & Plan Note (Signed)
Patient BP elevated to 170's/70's, but notes she has not taken her meds today. Patient takes her meds in evening. Discussed taking medication in morning, patient agreeable with change. Also discussed setting alarm, as patient forgets to take medicine sometimes.  -Valsartan 40 mg in am -BMP

## 2023-01-06 NOTE — Assessment & Plan Note (Signed)
Patient comes in for cramping/leg tightness she appreciates bilaterally for the last month. Patient denies any weakness or falls, but note's when she stands her thighs feel heavy and tight. She is otherwise in normal state of health. Patient MSK exam was benign w/ tightness in hamstrings w/ straight leg raise. Patient appears to have some muscle tightness in her hamstrings. Will recommend stretches and heating pad/ben gay to help with tightness. Will have patient CTM w/ plan for PT if tightness does not improve. Will also obtain BMP to look for electrolyte abnormalities.  - Hamstring exercises - BMP - Bengay/icyhot/heating pad prn -F/u prn, consider PT if not improving.

## 2023-01-06 NOTE — Assessment & Plan Note (Signed)
Patient comes in for f/u of her diabetes, with excellent control A1c 5.8 today. Patient's only medicine is Ozempic. Patient appreciating she has not appreciated much weight loss and would like to go up in dose. Will increase Ozempic to 1 mg q wk, as it's well tolerated.  -Ozempic 1 mg q wk -F/u 3 months

## 2023-01-11 NOTE — Telephone Encounter (Signed)
Spoke to patient regarding her pickup. Patient aware medication is at the providers office and she will be coming by to pickup.

## 2023-01-11 NOTE — Telephone Encounter (Signed)
Patient presents to clinic to pick up medication shipment.   Provided to patient per note from [place].   Veronda Prude, RN

## 2023-01-13 ENCOUNTER — Ambulatory Visit (INDEPENDENT_AMBULATORY_CARE_PROVIDER_SITE_OTHER): Payer: Medicare Other | Admitting: Family Medicine

## 2023-01-13 ENCOUNTER — Other Ambulatory Visit: Payer: Self-pay

## 2023-01-13 VITALS — BP 138/70 | Ht 62.0 in | Wt 170.0 lb

## 2023-01-13 DIAGNOSIS — M79645 Pain in left finger(s): Secondary | ICD-10-CM

## 2023-01-13 MED ORDER — METHYLPREDNISOLONE ACETATE 40 MG/ML IJ SUSP
20.0000 mg | Freq: Once | INTRAMUSCULAR | Status: AC
Start: 2023-01-13 — End: 2023-01-13
  Administered 2023-01-13: 20 mg via INTRA_ARTICULAR

## 2023-01-13 NOTE — Patient Instructions (Addendum)
Your pain is due to arthritis. These are the different medications you can take for this: Tylenol 500mg  1-2 tabs three times a day for pain. Voltaren gel, capsaicin, aspercreme, or biofreeze topically up to four times a day may also help with pain. Some supplements that may help for arthritis: Boswellia extract, curcumin, pycnogenol Cortisone injections are an option - you were given this today. Icing 15 minutes at a time 3-4 times a day. Bracing will help rest this also. Follow up with me as needed.

## 2023-01-13 NOTE — Progress Notes (Signed)
PCP: Bess Kinds, MD  Subjective:   HPI: Patient is a 72 y.o. female here for left thumb pain.  Patient denies known injury or trauma. Started to get pain a few weeks ago in base of left thumb area. No numbness/tingling. Worse with thumb motions. Looks a little more swollen in this area.  Past Medical History:  Diagnosis Date   Arthritis    Blood transfusion without reported diagnosis    Cataract    Coronary artery disease    Depression    Hyperlipidemia    Hypertension    Microcytic anemia    Mitral valve prolapse     Current Outpatient Medications on File Prior to Visit  Medication Sig Dispense Refill   ammonium lactate (LAC-HYDRIN) 12 % lotion APPLY 1 APPLICATION TOPICALLY AS NEEDED FOR DRY SKIN. 400 mL 1   aspirin 81 MG tablet Take 81 mg by mouth daily.     atorvastatin (LIPITOR) 80 MG tablet Take 1 tablet (80 mg total) by mouth daily. 90 tablet 3   conjugated estrogens (PREMARIN) vaginal cream Place 1 Applicatorful vaginally daily. 42.5 g 12   cycloSPORINE (RESTASIS) 0.05 % ophthalmic emulsion 1 drop 2 (two) times daily. Uses PRN     ezetimibe (ZETIA) 10 MG tablet Take 1 tablet (10 mg total) by mouth daily. 90 tablet 3   ferrous sulfate 325 (65 FE) MG tablet TAKE 1 TABLET BY MOUTH EVERY DAY WITH BREAKFAST 90 tablet 1   fluticasone (FLONASE) 50 MCG/ACT nasal spray Place 2 sprays into both nostrils daily. 16 g 4   gabapentin (NEURONTIN) 100 MG capsule Take 1 capsule (100 mg total) by mouth every morning AND 1 capsule (100 mg total) daily with lunch AND 2 capsules (200 mg total) at bedtime. 120 capsule 3   metoprolol succinate (TOPROL-XL) 25 MG 24 hr tablet Take 1 tablet (25 mg total) by mouth daily. 90 tablet 1   nitroGLYCERIN (NITROSTAT) 0.4 MG SL tablet DISSOLVE 1 TABLET UNDER THE TONGUE EVERY 5 MINUTES AS NEEDED FOR CHEST PAIN. DO NOT EXCEED A TOTAL OF 3 DOSES IN 15 MINUTES. (ORIG) 25 tablet 1   pantoprazole (PROTONIX) 40 MG tablet Take 1 tablet (40 mg total) by mouth  2 (two) times daily. 180 tablet 3   Polyethyl Glycol-Propyl Glycol (SYSTANE) 0.4-0.3 % GEL ophthalmic gel Place 1 Application into both eyes.     polyethylene glycol powder (GLYCOLAX/MIRALAX) 17 GM/SCOOP powder Take 17 g by mouth 2 (two) times daily as needed. 3350 g 1   Semaglutide, 1 MG/DOSE, 4 MG/3ML SOPN Inject 1 mg into the skin once a week.     sertraline (ZOLOFT) 100 MG tablet Take 1 tablet (100 mg total) by mouth daily. 90 tablet 1   terbinafine (LAMISIL) 250 MG tablet Take 1 tablet (250 mg total) by mouth daily. 30 tablet 0   valsartan (DIOVAN) 40 MG tablet Take 1 tablet (40 mg total) by mouth daily. 90 tablet 3   Current Facility-Administered Medications on File Prior to Visit  Medication Dose Route Frequency Provider Last Rate Last Admin   diclofenac Sodium (VOLTAREN) 1 % topical gel 2 g  2 g Topical PRN Bess Kinds, MD        Past Surgical History:  Procedure Laterality Date   ABDOMINAL HYSTERECTOMY     CARDIAC CATHETERIZATION     CATARACT EXTRACTION, BILATERAL     COLONOSCOPY     greater than 10 years ago   CORONARY PRESSURE/FFR STUDY N/A 03/01/2017   Procedure: INTRAVASCULAR PRESSURE  WIRE/FFR STUDY;  Surgeon: Kathleene Hazel, MD;  Location: [facility];  Service: Cardiovascular;  Laterality: N/A;   CORONARY STENT INTERVENTION N/A 03/01/2017   Procedure: CORONARY STENT INTERVENTION;  Surgeon: Kathleene Hazel, MD;  Location: [facility];  Service: Cardiovascular;  Laterality: N/A;   LEFT HEART CATH AND CORONARY ANGIOGRAPHY N/A 03/01/2017   Procedure: LEFT HEART CATH AND CORONARY ANGIOGRAPHY;  Surgeon: Kathleene Hazel, MD;  [facility]: [facility];  Service: Cardiovascular;  Laterality: N/A;   PARTIAL HYSTERECTOMY      No Known Allergies  BP 138/70   Ht 5\' 2"  (1.575 m)   Wt 170 lb (77.1 kg)   BMI 31.09 kg/m       No data to display              No data to display              Objective:  Physical Exam:  Gen:  NAD, comfortable in exam room  Left hand: Mild swelling of 1st CMC joint.  No other deformity, swelling, bruising. FROM with 5/5 strength. Tenderness to palpation over 1st CMC.  No 1st dorsal compartment, carpal tunnel, other tenderness. NVI distally.   Assessment & Plan:  1. Left thumb pain - 2/2 1st CMC arthritis.  Discussed options - icing, voltaren gel, bracing.  Opted for injection as well.  F/u prn.  After informed written consent timeout was performed.  Patient was seated on exam table.  Area overlying left 1st CMC joint prepped with alcohol swabs then utilizing ultrasound guidance was injected with 0.5:0.30mL lidocaine: depomedrol.  Patient tolerated procedure well without immediate complications.

## 2023-01-18 ENCOUNTER — Ambulatory Visit: Payer: Self-pay

## 2023-01-24 NOTE — Progress Notes (Unsigned)
Cardiology Office Note:  .   Date:  01/25/2023  ID:  SUMAYYA SMILOWITZ, DOB 1950-12-11, MRN [id number] PCP: Bess Kinds, MD  [facility] Providers Cardiologist:  Olga Millers. MD    }   History of Present Illness: [name] [name] is a 72 y.o. female  with a hx of hypertension, CAD (s/p DES to the ostial ramus in 2018), GERD, DM, HLD, palpitations, orthostatic hypotension, anxiety and depression.  Last seen in the office by Wallis Bamberg, DNP, on 05/20/2022 and was stable from a cardiac standpoint.  She comes today with main complaint of elevated heart rate when she walks any long distances with associated shortness of breath.  Goes away immediately with rest.  She also complains of some heaviness in her legs when she stands up along with numbness.  She states it starts in her lower back goes through her hips and into her feet.  She is able to walk but her legs feel very heavy.  Otherwise she denies any chest pain, dizziness, near-syncope, or profound fatigue.  ROS: As above otherwise negative.  Studies Reviewed: .   Echocardiogram 11/07/2020 1. Left ventricular ejection fraction, by estimation, is 60 to 65%. The  left ventricle has normal function. The left ventricle has no regional  wall motion abnormalities. Left ventricular diastolic parameters were  normal. The average left ventricular  global longitudinal strain is -24.5 %. The global longitudinal strain is  normal.   2. Right ventricular systolic function is normal. The right ventricular  size is normal.   3. The mitral valve is normal in structure. Trivial mitral valve  regurgitation. No evidence of mitral stenosis.   4. The aortic valve is tricuspid. Aortic valve regurgitation is not  visualized. Mild aortic valve sclerosis is present, with no evidence of  aortic valve stenosis.   5. The inferior vena cava is normal in size with greater than 50%  respiratory variability, suggesting right atrial pressure of  3 mmHg.   Carotid Ultrasound 04/29/2021 Summary:  Right Carotid: Velocities in the right ICA are consistent with a 1-39%  stenosis.   Left Carotid: Velocities in the left ICA are consistent with a 1-39%  stenosis.   Vertebrals: Bilateral vertebral arteries demonstrate antegrade flow.  Subclavians: Normal flow hemodynamics were seen in bilateral subclavian               arteries.   EKG Interpretation Date/Time:  Monday January 25 2023 15:44:29 EDT Ventricular Rate:  65 PR Interval:  150 QRS Duration:  78 QT Interval:  510 QTC Calculation: 530 R Axis:   60  Text Interpretation: Normal sinus rhythm Nonspecific T wave abnormality Prolonged QT When compared with ECG of 15-Dec-2020 14:16, PREVIOUS ECG IS PRESENT Confirmed by Joni Reining [phone]) on 01/25/2023 3:52:18 PM    Physical Exam:   VS:  BP 122/74 (BP Location: Left Arm, Patient Position: Sitting, Cuff Size: Large)   Pulse 65   Ht 5\' 2"  (1.575 m)   Wt 172 lb (78 kg)   SpO2 98%   BMI 31.46 kg/m    Wt Readings from Last 3 Encounters:  01/25/23 172 lb (78 kg)  01/13/23 170 lb (77.1 kg)  01/04/23 175 lb (79.4 kg)    GEN: Well nourished, well developed in no acute distress NECK: No JVD; No carotid bruits CARDIAC: RRR, no murmurs, rubs, gallops 2+ bilateral posterior tibial, dorsalis pedis. RESPIRATORY:  Clear to auscultation without rales, wheezing or rhonchi  ABDOMEN: Soft, non-tender,  non-distended EXTREMITIES:  No edema; No deformity   ASSESSMENT AND PLAN: .    Hypertension: Blood pressure is currently well-controlled on valsartan 40 mg daily and metoprolol 25 mg daily.  No changes in her medication regimen.  Labs are followed by PCP whom she sees regularly.  2.  Hypercholesterolemia: Remains on atorvastatin 80 mg daily.  Uncertain if lower extremity heaviness and numbness is related to statin therapy.  Does not appear to be myalgia symptoms.  Most recent labs on 04/21/2022 total cholesterol 128 LDL 55, HDL  51.  3.  Chronic leg heaviness and pain: Recommend follow-up with PCP may need to do lumbar spine MRI to evaluate for degenerative disc disease at their discretion.  She has very strong dorsalis pedis posterior tibial pulses without evidence of edema coolness or numbness to touch.         Signed, Bettey Mare. Liborio Nixon, ANP, AACC

## 2023-01-25 ENCOUNTER — Ambulatory Visit: Payer: Medicare Other | Attending: Cardiology | Admitting: Adult Health

## 2023-01-25 ENCOUNTER — Encounter: Payer: Self-pay | Admitting: Adult Health

## 2023-01-25 ENCOUNTER — Telehealth: Payer: Self-pay

## 2023-01-25 VITALS — BP 122/74 | HR 65 | Ht 62.0 in | Wt 172.0 lb

## 2023-01-25 DIAGNOSIS — E7800 Pure hypercholesterolemia, unspecified: Secondary | ICD-10-CM | POA: Diagnosis not present

## 2023-01-25 DIAGNOSIS — I2510 Atherosclerotic heart disease of native coronary artery without angina pectoris: Secondary | ICD-10-CM

## 2023-01-25 DIAGNOSIS — I10 Essential (primary) hypertension: Secondary | ICD-10-CM | POA: Diagnosis not present

## 2023-01-25 DIAGNOSIS — R252 Cramp and spasm: Secondary | ICD-10-CM | POA: Diagnosis not present

## 2023-01-25 NOTE — Patient Instructions (Signed)
Medication Instructions:  No changes *If you need a refill on your cardiac medications before your next appointment, please call your pharmacy*   Lab Work: No labs If you have labs (blood work) drawn today and your tests are completely normal, you will receive your results only by: MyChart Message (if you have MyChart) OR A paper copy in the mail If you have any lab test that is abnormal or we need to change your treatment, we will call you to review the results.   Testing/Procedures: No Testing   Follow-Up: At [facility], you and your health needs are our priority.  As part of our continuing mission to provide you with exceptional heart care, we have created designated Provider Care Teams.  These Care Teams include your primary Cardiologist (physician) and Advanced Practice Providers (APPs -  Physician Assistants and Nurse Practitioners) who all work together to provide you with the care you need, when you need it.  We recommend signing up for the patient portal called "MyChart".  Sign up information is provided on this After Visit Summary.  MyChart is used to connect with patients for Virtual Visits (Telemedicine).  Patients are able to view lab/test results, encounter notes, upcoming appointments, etc.  Non-urgent messages can be sent to your provider as well.   To learn more about what you can do with MyChart, go to [email or web address].    Your next appointment:   1 year(s)  Provider:   Olga Millers, MD

## 2023-01-25 NOTE — Telephone Encounter (Signed)
Patient calls nurse line for the following reasons.  Patient states that she discussed tizanidine refill with provider at visit on 01/04/23. She states that pharmacy has not received a new prescription. Per chart review, I am unable to see where this was discussed or sent in. Will forward to PCP for further advisement.  Patient also asking about appointment for BP check. This was a missed nurse visit last week. Rescheduled for 01/28/23.  Veronda Prude, RN

## 2023-01-26 MED ORDER — TIZANIDINE HCL 2 MG PO CAPS: 2.0000 mg | ORAL_CAPSULE | Freq: Three times a day (TID) | ORAL | 0 refills | Status: DC | PRN

## 2023-01-27 ENCOUNTER — Other Ambulatory Visit: Payer: Self-pay

## 2023-01-28 ENCOUNTER — Ambulatory Visit: Payer: Medicare Other

## 2023-01-28 DIAGNOSIS — Z0130 Encounter for examination of blood pressure without abnormal findings: Secondary | ICD-10-CM

## 2023-01-28 MED ORDER — SEMAGLUTIDE (1 MG/DOSE) 4 MG/3ML ~~LOC~~ SOPN: 1.0000 mg | PEN_INJECTOR | SUBCUTANEOUS | Status: DC

## 2023-01-28 NOTE — Progress Notes (Signed)
Patient here today for BP check.      Last BP was on 01/04/2023 and was 165/74.  Resting BP today is 134/70 with a pulse of 66. BP was checked in left arm with manual BP cuff.   Symptoms present: none.   Patient has been taking BP medications as prescribed.   Routed note to PCP.      Veronda Prude, RN

## 2023-01-29 ENCOUNTER — Other Ambulatory Visit: Payer: Self-pay | Admitting: *Deleted

## 2023-01-29 DIAGNOSIS — I10 Essential (primary) hypertension: Secondary | ICD-10-CM

## 2023-01-31 MED ORDER — VALSARTAN 40 MG PO TABS
40.0000 mg | ORAL_TABLET | Freq: Every day | ORAL | 3 refills | Status: DC
Start: 2023-01-31 — End: 2024-01-25

## 2023-02-13 ENCOUNTER — Other Ambulatory Visit: Payer: Self-pay | Admitting: Student

## 2023-02-13 DIAGNOSIS — I2510 Atherosclerotic heart disease of native coronary artery without angina pectoris: Secondary | ICD-10-CM

## 2023-03-08 ENCOUNTER — Other Ambulatory Visit: Payer: Self-pay | Admitting: Student

## 2023-03-08 DIAGNOSIS — R252 Cramp and spasm: Secondary | ICD-10-CM

## 2023-03-15 ENCOUNTER — Ambulatory Visit: Payer: Medicare Other

## 2023-03-15 DIAGNOSIS — Z111 Encounter for screening for respiratory tuberculosis: Secondary | ICD-10-CM

## 2023-03-15 NOTE — Progress Notes (Signed)
Patient presents to nurse clinic for PPD placement.  PPD placed in left ventral forearm.  Patient to return for PPD read on 12/19 ~10:30am.

## 2023-03-18 ENCOUNTER — Ambulatory Visit (INDEPENDENT_AMBULATORY_CARE_PROVIDER_SITE_OTHER): Payer: Medicare Other

## 2023-03-18 DIAGNOSIS — Z111 Encounter for screening for respiratory tuberculosis: Secondary | ICD-10-CM

## 2023-03-18 LAB — TB SKIN TEST
Induration: 0 mm
TB Skin Test: NEGATIVE

## 2023-03-18 NOTE — Progress Notes (Signed)
Patient is here for a PPD read.  It was placed on 03/15/2023 in the left forearm @ 2:50 pm.    PPD RESULTS:  Result: negative Induration: 0 mm  Letter created and given to patient for documentation purposes. Veronda Prude, RN

## 2023-03-26 ENCOUNTER — Other Ambulatory Visit: Payer: Self-pay | Admitting: Student

## 2023-04-08 ENCOUNTER — Encounter: Payer: Self-pay | Admitting: Student

## 2023-04-08 ENCOUNTER — Ambulatory Visit (INDEPENDENT_AMBULATORY_CARE_PROVIDER_SITE_OTHER): Payer: Medicare Other | Admitting: Student

## 2023-04-08 VITALS — BP 133/66 | HR 71 | Ht 62.0 in | Wt 164.0 lb

## 2023-04-08 DIAGNOSIS — Z23 Encounter for immunization: Secondary | ICD-10-CM | POA: Diagnosis not present

## 2023-04-08 DIAGNOSIS — L84 Corns and callosities: Secondary | ICD-10-CM | POA: Insufficient documentation

## 2023-04-08 NOTE — Patient Instructions (Addendum)
 It was great to see you! Thank you for allowing me to participate in your care!  I recommend that you always bring your medications to each appointment as this makes it easy to ensure we are on the correct medications and helps us  not miss when refills are needed.  Our plans for today:  - Callus on foot Can treat conservatively with callus cushions and and Vaseline to area multiple times a day, until it softens up       Will also place a referral to Podiatry if you would like to have it removed.   - Vaccines  Flu and Covid given today  - Care for Self  It is fantastic that you are taking such good care of your mother! Be sure to take care of yourself and do what you need to be good/happy! Taking care of loved ones can be hard and burdensome, even though we love them! There is nothing wrong with being sure to prioritize your own mental/physical health!  Take care and seek immediate care sooner if you develop any concerns.   Dr. Penne Rhein, MD [facility] Medicine

## 2023-04-08 NOTE — Progress Notes (Signed)
  SUBJECTIVE:   CHIEF COMPLAINT / HPI:   Callus on Rt foot Has been on her ~ 3 months, hurts sometimes towards the end of the day, when walking on it. Pain can be sharp at times, but is relieved when she get's off her foot.   Also wanting vaccines today Due for Covid, and Flu  PERTINENT  PMH / PSH:    OBJECTIVE:  BP 133/66   Pulse 71   Ht 5' 2 (1.575 m)   Wt 164 lb (74.4 kg)   SpO2 99%   BMI 30.00 kg/m  Physical Exam Musculoskeletal:       Feet:  Feet:     Right foot:     Skin integrity: Callus and dry skin present. No ulcer, blister, skin breakdown, erythema, warmth or fissure.     Toenail Condition: Right toenails are abnormally thick.     Comments: Small sub centimeter callus located on plantar surface of Rt foot, tender to deep palpation     ASSESSMENT/PLAN:   Assessment & Plan Callus of foot Patient comes in for callus on right foot, plantar surface, that is been present for roughly 3 months.  Patient appreciates tenderness/discomfort when walking/standing for prolonged periods, and having to switch shoes as needed for discomfort.  Patient has subcentimeter callus on plantar surface of right foot.  Will recommend conservative treatment, and offer referral to podiatry.  Patient wanting to try callus pads, and consider seeing podiatry. - Referral to podiatry - Callus pads and Vaseline to callus, multiple times a day No follow-ups on file. Penne Rhein, MD 04/08/2023, 2:10 PM PGY-3, [facility]

## 2023-04-08 NOTE — Assessment & Plan Note (Addendum)
 Patient comes in for callus on right foot, plantar surface, that is been present for roughly 3 months.  Patient appreciates tenderness/discomfort when walking/standing for prolonged periods, and having to switch shoes as needed for discomfort.  Patient has subcentimeter callus on plantar surface of right foot.  Will recommend conservative treatment, and offer referral to podiatry.  Patient wanting to try callus pads, and consider seeing podiatry. - Referral to podiatry - Callus pads and Vaseline to callus, multiple times a day

## 2023-05-10 ENCOUNTER — Ambulatory Visit (INDEPENDENT_AMBULATORY_CARE_PROVIDER_SITE_OTHER): Payer: Medicare Other | Admitting: Podiatry

## 2023-05-10 ENCOUNTER — Encounter: Payer: Self-pay | Admitting: Podiatry

## 2023-05-10 DIAGNOSIS — D2371 Other benign neoplasm of skin of right lower limb, including hip: Secondary | ICD-10-CM

## 2023-05-10 DIAGNOSIS — M79674 Pain in right toe(s): Secondary | ICD-10-CM | POA: Diagnosis not present

## 2023-05-10 DIAGNOSIS — M79675 Pain in left toe(s): Secondary | ICD-10-CM

## 2023-05-10 DIAGNOSIS — B351 Tinea unguium: Secondary | ICD-10-CM | POA: Diagnosis not present

## 2023-05-10 NOTE — Progress Notes (Signed)
Subjective: Chief Complaint  Patient presents with   Callouses    RM#13 Right foot callus/trim   73 year old female presents after the above concerns.  She has a callus on the right foot submetatarsal 5 which is causing discomfort she also needs her nails trimmed as are thickened elongated she has difficulty trimming herself.  She previously was on Lamisil for nail fungus. No open lesions. No other concerns today.   Currently on gabapentin for neuropathy  Objective: AAO x3, NAD DP/PT pulses palpable bilaterally, CRT less than 3 seconds Hyperkeratotic lesion noted right foot submetatarsal 5 without any underlying ulceration, drainage or any signs of infection. Nails are hypertrophic, dystrophic, brittle, discolored, elongated 10. No surrounding redness or drainage. Tenderness nails 1-5 bilaterally.  No pain with calf compression, swelling, warmth, erythema  Assessment: 73 year old female with symptomatic onychosis, hyperkeratotic lesion  Plan: -All treatment options discussed with the patient including all alternatives, risks, complications.  -Sharply debrided nails x 10 without any complications or bleeding -Debrided the hyperkeratotic lesion x 1 without any complications or bleeding.  I cleaned the area with alcohol.  Salicylic acid was applied followed by a bandage.  Postprocedure instructions discussed.  Monitor for any signs or symptoms of infection. -Patient encouraged to call the office with any questions, concerns, change in symptoms.   Return if symptoms worsen or fail to improve.  Vivi Barrack DPM

## 2023-05-10 NOTE — Patient Instructions (Signed)
 Keep the bandage on for 24 hours. At that time, remove and clean with soap and water. If it hurts or burns before 24 hours go ahead and remove the bandage and wash with soap and water. Keep the area clean. If there is any blistering cover with antibiotic ointment and a bandage. Monitor for any redness, drainage, or other signs of infection. Call the office if any are to occur. If you have any questions, please call the office at [phone].

## 2023-05-21 ENCOUNTER — Telehealth: Payer: Self-pay

## 2023-05-21 ENCOUNTER — Other Ambulatory Visit: Payer: Self-pay | Admitting: Student

## 2023-05-21 MED ORDER — HYDROXYZINE HCL 10 MG PO TABS: 10.0000 mg | ORAL_TABLET | Freq: Three times a day (TID) | ORAL | 1 refills | Status: DC | PRN

## 2023-05-21 NOTE — Progress Notes (Signed)
Pt having difficult time with nerves and sleeping. Will offer prn atarax to take.

## 2023-05-21 NOTE — Telephone Encounter (Signed)
Patient calls nurse line requesting anxiety medication.   She reports her mother passed away on 2023-06-16 and she is having a really hard time.   She reports she is barely eating and barely sleeping. She reports her nerves are shot and she feels very on edge.   Condolences given.   Advised will forward to PCP.

## 2023-05-21 NOTE — Telephone Encounter (Signed)
Called patient to discuss current status.  Patient reports her mother passed away on the Jun 02, 2023.  Patient reports she is having a hard time, feeling anxious, hard to slow down, sad, and getting poor sleep.  Patient reports she is still taking Zoloft, but is not helping.  This provider recommended Atarax as needed to help with anxiety and sleep, and schedule patient for follow-up appointment March 4.

## 2023-06-01 ENCOUNTER — Ambulatory Visit: Payer: Self-pay | Admitting: Student

## 2023-06-07 ENCOUNTER — Encounter: Payer: Self-pay | Admitting: Student

## 2023-06-07 ENCOUNTER — Ambulatory Visit (INDEPENDENT_AMBULATORY_CARE_PROVIDER_SITE_OTHER): Admitting: Student

## 2023-06-07 VITALS — BP 170/85 | HR 64 | Ht 62.0 in | Wt 161.0 lb

## 2023-06-07 DIAGNOSIS — F32A Depression, unspecified: Secondary | ICD-10-CM

## 2023-06-07 DIAGNOSIS — F419 Anxiety disorder, unspecified: Secondary | ICD-10-CM | POA: Diagnosis not present

## 2023-06-07 MED ORDER — SERTRALINE HCL 100 MG PO TABS: 100.0000 mg | ORAL_TABLET | Freq: Every day | ORAL | 1 refills | Status: DC

## 2023-06-07 MED ORDER — SERTRALINE HCL 50 MG PO TABS: 50.0000 mg | ORAL_TABLET | Freq: Every day | ORAL | 1 refills | Status: AC

## 2023-06-07 NOTE — Progress Notes (Signed)
  SUBJECTIVE:   CHIEF COMPLAINT / HPI:   Depression and Anxiety -Zoloft 100 mg daily, atarax 10 mg TID PRN  Today: Is having a hard time with her feeling/emotions. Feeling sad and easily bothered by people now. Is wanting to spend time by herself and feels like she has to adjust her mood for people around her. Has a lot of social stressors with her children. Denies any SI or plans to harm herself. Took atarax to sleep last night and felt like it helped.   PERTINENT  PMH / PSH:    OBJECTIVE:  BP (!) 170/85   Pulse 64   Ht 5\' 2"  (1.575 m)   Wt 161 lb (73 kg)   SpO2 100%   BMI 29.45 kg/m  Physical Exam Psychiatric:        Attention and Perception: Attention normal.        Mood and Affect: Mood is depressed. Affect is tearful.        Speech: Speech normal. Speech is not rapid and pressured, delayed, slurred or tangential.        Behavior: Behavior normal. Behavior is not agitated, slowed, aggressive, withdrawn, hyperactive or combative. Behavior is cooperative.        Thought Content: Thought content does not include suicidal ideation.      ASSESSMENT/PLAN:   Assessment & Plan Anxiety and depression Patient comes in for follow-up of her depression.  Patient reports her depression has been worse since her mother's passing a few weeks back.  Patient appreciates she is more irritable, and having a difficult time staying asleep, and prefers to be by herself if possible.  Patient denies any passive or active suicidal ideation, or intent to harm herself.  Patient appreciates that Atarax has helped her sleep when taking before bed, will recommend she continue this.  Will increase dose of Zoloft and have patient follow-up closely. Patient denies any SI. - Zoloft 150 mg daily - Atarax 10 mg at bedtime - Find Therapist (resources given) - F/u 2 weeks No follow-ups on file. Bess Kinds, MD 06/07/2023, 5:45 PM PGY-3, [facility]

## 2023-06-07 NOTE — Patient Instructions (Addendum)
 It was great to see you! Thank you for allowing me to participate in your care!  I recommend that you always bring your medications to each appointment as this makes it easy to ensure we are on the correct medications and helps [place] not miss when refills are needed.  Our plans for today:  - Depression   Will increase your Zoloft to 150 mg   100 mg pill plus 50 mg pill  Will continue Atarax 10 mg before bed  Find and start seeing a therapist (list provided below)  Follow up 2 weeks  Take care of yourself and give yourself as much grace and patience as you can, while dealing with people and stressors.   Take care and seek immediate care sooner if you develop any concerns.   Dr. Bess Kinds, MD [facility] Family Medicine    Therapy and Counseling Resources Most providers on this list will take Medicaid. Patients with commercial insurance or Medicare should contact their insurance company to get a list of in network providers.  [facility] [employer] (takes children) Location 1: [address] [place], [place] [address] Location 2: [address] [place], [place] [address] [phone]   [facility] (spanish speaking therapist available)(habla espanol)(take medicare and medicaid)  [address], [place], [place] [address], [place] [email or web address] [phone]  [facility]:Psychiatry and Counseling [address] [place], [place] [address] [phone]  [facility]   [address], [address] [place], [place] [address]      [phone]  [facility] (spanish available) [address]  [place], [place] [address] [phone]  [facility] (take [facility] and medicare) [address], [address]  [place], [place] [address]       [phone]     [facility] (virtual only) [phone]  [facility]ovita [facility]ussmaul [facility] [address], [place], [place] [phone]  [facility]:  [address] [place] [phone]  [facility]:  [address] [place] [place] [phone]  [facility] (under & uninsured) [address]   [place] [phone]    [email or web address]    [facility] [address]  [place]    [phone]  [facility] -[address]     Phone:  [phone]     [facility]-  [address]  [phone]   [facility] [address]      [place], [place] [phone]  [facility]: [address] [place], [place], [place]  [phone]   [facility] (Spanish therapist) [email or web address]  [address] [place]  [address]   [place] [address]    [phone]    [facility]   [address],  [place], [place]  [phone]   [facility]  [address] [place]  [phone]  [facility]  [address] [place], [place]        [phone]  Open Access/Walk In Clinic under & uninsured  [facility]  [address] [place], [place] [phone] Crisis [phone]  [facility] [address],  (Spanish)   [address], [place]: [phone]) 8:30 - 12; 1 - 2:30  [facility] [employer],  [address] [place], [place]    [phone]):8:30 - 12; 2 - 3PM  [facility] [employer],  [address],  [place]; [phone]):   Mon - Fri 8 AM - 5 PM  [facility] [address] [place]  MWF 12:30 to 3:00 or call to schedule an appointment  [phone]  Specific Provider options Psychology Today  [email or web address] click on find a therapist  enter your zip code left side and select or tailor a therapist for your specific need.   [facility] Provider Directory [email or web address]   (Medicaid)   Follow all drop down to find a provider  Social Support program [facility] [place] [phone] or PhotoSolver.pl [address], [place], [place] Recovery support and educational   24- Hour Availability:   [facility]  [address] [place], [place] [phone] Crisis [phone]  [facility] [phone]  [place] [facility]  [phone]   [facility]  [phone] (after hours)  Therapeutic Alternative/Mobile Crisis   [phone]  [place] National Suicide Hotline  [phone] Len Childs)  Call 911 or go to emergency room  [facility]  [phone]);  [place] and [employer]  [phone]); [place], [place], [place], [place], [place], [place], [place]

## 2023-06-07 NOTE — Assessment & Plan Note (Addendum)
 Patient comes in for follow-up of her depression.  Patient reports her depression has been worse since her mother's passing a few weeks back.  Patient appreciates she is more irritable, and having a difficult time staying asleep, and prefers to be by herself if possible.  Patient denies any passive or active suicidal ideation, or intent to harm herself.  Patient appreciates that Atarax has helped her sleep when taking before bed, will recommend she continue this.  Will increase dose of Zoloft and have patient follow-up closely. Patient denies any SI. - Zoloft 150 mg daily - Atarax 10 mg at bedtime - Find Therapist (resources given) - F/u 2 weeks

## 2023-06-23 NOTE — Patient Instructions (Signed)
 It was great to see you! Thank you for allowing me to participate in your care!  I recommend that you always bring your medications to each appointment as this makes it easy to ensure we are on the correct medications and helps [place] not miss when refills are needed.  Our plans for today:  - Depression / Anxiety You are doing great! I'm so happy to see you doing better! Keep up the [place] Work! Continue Zoloft 150 mg daily Continue Atarax 10 mg at bedtime Follow up as needed  Take care and seek immediate care sooner if you develop any concerns.   Dr. Bess Kinds, MD [facility]

## 2023-06-23 NOTE — Progress Notes (Signed)
  SUBJECTIVE:   CHIEF COMPLAINT / HPI:   Depression / Anxiety f/u -Meds: Zoloft 150 mg daily, Atarax 10 mg at bedtime -Pt to find therapist  PERTINENT  PMH / PSH: ***  Past Medical History:  Diagnosis Date   Arthritis    Blood transfusion without reported diagnosis    Cataract    Coronary artery disease    Depression    Hyperlipidemia    Hypertension    Microcytic anemia    Mitral valve prolapse    OBJECTIVE:  There were no vitals taken for this visit. ***  ASSESSMENT/PLAN:   Assessment & Plan  No follow-ups on file. Bess Kinds, MD 06/23/2023, 12:09 AM PGY-***, [facility] {    This will disappear when note is signed, click to select method of visit    :1}

## 2023-06-24 ENCOUNTER — Ambulatory Visit: Admitting: Student

## 2023-06-24 VITALS — BP 125/75 | HR 72 | Ht 62.0 in | Wt 157.6 lb

## 2023-06-24 DIAGNOSIS — F419 Anxiety disorder, unspecified: Secondary | ICD-10-CM | POA: Diagnosis not present

## 2023-06-24 DIAGNOSIS — F32A Depression, unspecified: Secondary | ICD-10-CM

## 2023-06-24 MED ORDER — HYDROXYZINE HCL 10 MG PO TABS: 10.0000 mg | ORAL_TABLET | Freq: Three times a day (TID) | ORAL | 1 refills | Status: DC | PRN

## 2023-06-24 NOTE — Assessment & Plan Note (Addendum)
 Patient comes in for follow-up of her depression.  Patient reports she is doing well.  Patient reports good compliance with medication.  Patient reports she is not needed to use the hydroxyzine, and her sleep is improved.  Patient appreciates that her mood is better, her irritation is less, and she is better able to handle stressful situations associated with the family.  Will plan to continue medication at current dose and follow-up as needed.  No SI or plans to harm self. - Continue Zoloft 150 mg daily - Continue Atarax 10 mg as needed - F/u as needed

## 2023-07-20 ENCOUNTER — Other Ambulatory Visit: Payer: Self-pay | Admitting: Student

## 2023-07-23 ENCOUNTER — Other Ambulatory Visit: Payer: Self-pay | Admitting: Student

## 2023-07-23 DIAGNOSIS — R252 Cramp and spasm: Secondary | ICD-10-CM

## 2023-07-23 MED ORDER — SEMAGLUTIDE (1 MG/DOSE) 4 MG/3ML ~~LOC~~ SOPN: 1.0000 mg | PEN_INJECTOR | SUBCUTANEOUS | Status: DC

## 2023-08-06 ENCOUNTER — Telehealth: Payer: Self-pay | Admitting: Cardiology

## 2023-08-06 DIAGNOSIS — I2510 Atherosclerotic heart disease of native coronary artery without angina pectoris: Secondary | ICD-10-CM

## 2023-08-06 MED ORDER — NITROGLYCERIN 0.4 MG SL SUBL: 0.4000 mg | SUBLINGUAL_TABLET | SUBLINGUAL | 3 refills | Status: AC | PRN

## 2023-08-06 NOTE — Telephone Encounter (Signed)
 Called patient back about her message. Patient stated last night she had chest pressure. Patient stated it felt like it was holding her down. Patient stated she took something for indigestion and an aspirin  and it helped. Patient stated this happen twice last night. Asked patient about her nitroglycerin , she stated it has expired. Informed patient that we would send her a refill to her pharmacy. Informed patient on how to use nitroglycerin , and when to go to the ED or call 911. Made patient an appointment to see DOD next Friday, since this is the next available. Encouraged patient to go to ED if her symptoms come back with no relief with medications or if it gets worse before her appointment.

## 2023-08-06 NOTE — Telephone Encounter (Signed)
 Pt c/o of Chest Pain: STAT if active (IN THIS MOMENT) CP, including tightness, pressure, jaw pain, shoulder/upper arm/back pain, SOB, nausea, and vomiting.  1. Are you having CP right now (tightness, pressure, or discomfort)? No.  2. Are you experiencing any other symptoms (ex. SOB, nausea, vomiting, sweating)? Felt like HR was racing (hr 73) , states "felt like something was weighing me down"  3. How long have you been experiencing CP? Two episodes last night when she laid down.   4. Is your CP continuous or coming and going? Coming and going   5. Have you taken Nitroglycerin ? No. She took heartburn/gas medication and then she took an aspirin , which made her feel better   6. If CP returns before callback, please consider calling 911. ?

## 2023-08-13 ENCOUNTER — Encounter: Payer: Self-pay | Admitting: Cardiology

## 2023-08-13 ENCOUNTER — Other Ambulatory Visit (HOSPITAL_COMMUNITY): Payer: Self-pay

## 2023-08-13 ENCOUNTER — Ambulatory Visit: Attending: Cardiology | Admitting: Cardiology

## 2023-08-13 VITALS — BP 144/76 | HR 67 | Ht 62.0 in | Wt 156.8 lb

## 2023-08-13 DIAGNOSIS — I10 Essential (primary) hypertension: Secondary | ICD-10-CM | POA: Diagnosis not present

## 2023-08-13 DIAGNOSIS — E7800 Pure hypercholesterolemia, unspecified: Secondary | ICD-10-CM

## 2023-08-13 DIAGNOSIS — R002 Palpitations: Secondary | ICD-10-CM | POA: Diagnosis not present

## 2023-08-13 DIAGNOSIS — I25118 Atherosclerotic heart disease of native coronary artery with other forms of angina pectoris: Secondary | ICD-10-CM

## 2023-08-13 DIAGNOSIS — Z01812 Encounter for preprocedural laboratory examination: Secondary | ICD-10-CM

## 2023-08-13 MED ORDER — ISOSORBIDE MONONITRATE ER 30 MG PO TB24
30.0000 mg | ORAL_TABLET | Freq: Every day | ORAL | 0 refills | Status: DC
  Filled 2023-08-13: qty 90, 90d supply, fill #0

## 2023-08-13 MED ORDER — METOPROLOL SUCCINATE ER 50 MG PO TB24
50.0000 mg | ORAL_TABLET | Freq: Every day | ORAL | 0 refills | Status: DC
  Filled 2023-08-13: qty 90, 90d supply, fill #0

## 2023-08-13 NOTE — Patient Instructions (Addendum)
 Medication Instructions:  Your physician has recommended you make the following change in your medication:   1) START isosorbide  (Imdur ) 30 mg daily 2) INCREASE metoprolol  succinate (Toprol  XL) to 50 mg daily  *If you need a refill on your cardiac medications before your next appointment, please call your pharmacy*  Lab Work: TODAY (go to 1st floor lab): CBC, BMET If you have labs (blood work) drawn today and your tests are completely normal, you will receive your results only by: MyChart Message (if you have MyChart) OR A paper copy in the mail If you have any lab test that is abnormal or we need to change your treatment, we will call you to review the results.  Testing/Procedures: Your physician has requested that you have a cardiac catheterization. Cardiac catheterization is used to diagnose and/or treat various heart conditions. Doctors may recommend this procedure for a number of different reasons. The most common reason is to evaluate chest pain. Chest pain can be a symptom of coronary artery disease (CAD), and cardiac catheterization can show whether plaque is narrowing or blocking your heart's arteries. This procedure is also used to evaluate the valves, as well as measure the blood flow and oxygen levels in different parts of your heart. For further information please visit [email or web address] Please follow instruction sheet, as given.   Follow-Up: At [facility], you and your health needs are our priority.  As part of our continuing mission to provide you with exceptional heart care, our providers are all part of one team.  This team includes your primary Cardiologist (physician) and Advanced Practice Providers or APPs (Physician Assistants and Nurse Practitioners) who all work together to provide you with the care you need, when you need it.  Your next appointment:   1 month(s)  The format for your next appointment:   In Person  Provider:   Alexandria Angel, MD{  We  recommend signing up for the patient portal called "MyChart".  Sign up information is provided on this After Visit Summary.  MyChart is used to connect with patients for Virtual Visits (Telemedicine).  Patients are able to view lab/test results, encounter notes, upcoming appointments, etc.  Non-urgent messages can be sent to your provider as well.   To learn more about what you can do with MyChart, go to [email or web address].   Other Instructions Cath Instructions below     [facility] A DEPT OF [facility] [address] A DEPT OF THE [place] [address] [place] [address] Dept: [phone] Loc: [phone]  CECIA DYMENT  08/13/2023  You are scheduled for a Cardiac Catheterization on Monday, May 19 with Dr. Berry Bristol.  1. Please arrive at the [facility] (Main Entrance A) at [facility]: [address] [place], [place] [address] at 5:30 AM (This time is 2 hour(s) before your procedure to ensure your preparation).   Free valet parking service is available. You will check in at ADMITTING. The support person will be asked to wait in the waiting room.  It is OK to have someone drop you off and come back when you are ready to be discharged.    Special note: Every effort is made to have your procedure done on time. Please understand that emergencies sometimes delay scheduled procedures.  2. Diet: Do not eat solid foods after midnight.  The patient may have clear liquids until 5am upon the day of the procedure.  3. Labs:  You will need to have blood drawn on Friday 5/16. You do not need to be fasting.  4. Medication instructions in preparation for your procedure:    Hold Semglutide do not take weekly dose tonight       On the morning of your procedure, take your Aspirin  81 mg and any morning medicines NOT listed above.  You may use sips of water.  5. Plan to go home the same day, you will only stay overnight if  medically necessary. 6. Bring a current list of your medications and current insurance cards. 7. You MUST have a responsible person to drive you home. 8. Someone MUST be with you the first 24 hours after you arrive home or your discharge will be delayed. 9. Please wear clothes that are easy to get on and off and wear slip-on shoes.  Thank you for allowing us  to care for you!   -- [facility]

## 2023-08-13 NOTE — H&P (View-Only) (Signed)
 Cardiology Office Note:  .   Date:  08/14/2023  ID:  [name] [name], DOB [birth year], MRN [id number] PCP: Wilhemena Harbour, MD  [facility] Providers Cardiologist:  Alexandria Angel, MD   History of Present Illness: .   [name] [name] is a 73 y.o. hypertension, CAD (s/p DES to the ostial ramus in 2018), GERD, DM, HLD, palpitations, orthostatic hypotension, anxiety and depression.  Seen on an urgent basis due to new onset of chest pain and palpitations that started about a week to 10 days ago.  Symptoms described as chest tightness with radiation to the left arm with exertional activity limiting her activity significantly and relieved with rest and also chest pain comes on with stress.  Palpitation symptoms also started a week ago but they are not concurrent.  Describes it as lasting a few minutes but mostly few seconds at any time with no precipitating factors.  Discussed the use of AI scribe software for clinical note transcription with the patient, who gave verbal consent to proceed.  History of Present Illness She normally follows up with Dr. Audery Blazing.  She has experienced chest discomfort and palpitations for the past week. Palpitations are frequent, with episodes of tachycardia and diaphoresis, often accompanied by lightheadedness. The initial episode occurred during light activity, lasting about a minute, with relief upon resting. Symptoms have improved with increased rest over the past two days.  Chest discomfort, described as heaviness radiating to the arm, began last week. Episodes occur a few times weekly, lasting a couple of minutes, sometimes triggered by positional changes or mild exertion, such as walking to the clinic today. Symptoms started 1 week ago.   She underwent stent placement in 2018 in the ramus intermediate branch and has experienced similar symptoms previously. Current medications include metoprolol  succinate, aspirin , Lipitor, and Zetia . She recently  started using nitroglycerin  as needed.  Blood pressure readings have fluctuated, with recent measurements of 150/unknown and 116/unknown. Emotional stress, such as arguments, triggers chest discomfort and palpitations. Diabetes is managed with Ozempic , and blood sugar levels are stable. She quit smoking years ago and engages in light physical activities with frequent breaks.  Labs   Lab Results  Component Value Date   CHOL 128 04/21/2022   HDL 51 04/21/2022   LDLCALC 55 04/21/2022   LDLDIRECT 129 (H) 01/01/2010   TRIG 122 04/21/2022   CHOLHDL 2.5 04/21/2022   Lab Results  Component Value Date   NA 141 08/13/2023   K 4.7 08/13/2023   CO2 19 (L) 08/13/2023   GLUCOSE 80 08/13/2023   BUN 13 08/13/2023   CREATININE 0.99 08/13/2023   CALCIUM  9.9 08/13/2023   EGFR 60 08/13/2023   GFRNONAA >60 12/15/2020      Latest Ref Rng & Units 08/13/2023    3:04 PM 01/04/2023    4:45 PM 07/09/2021   11:48 AM  BMP  Glucose 70 - 99 mg/dL 80  74  782   BUN 8 - 27 mg/dL 13  11  13    Creatinine 0.57 - 1.00 mg/dL 9.56  2.13  0.86   BUN/Creat Ratio 12 - 28 13  11  13    Sodium 134 - 144 mmol/L 141  142  142   Potassium 3.5 - 5.2 mmol/L 4.7  4.3  4.1   Chloride 96 - 106 mmol/L 105  104  106   CO2 20 - 29 mmol/L 19  22  24    Calcium  8.7 - 10.3 mg/dL 9.9  9.5  9.6  Latest Ref Rng & Units 08/13/2023    3:04 PM 07/28/2022   12:29 PM 04/20/2022    2:32 PM  CBC  WBC 3.4 - 10.8 x10E3/uL 7.7  6.9  7.4   Hemoglobin 11.1 - 15.9 g/dL 16.1  09.6  04.5   Hematocrit 34.0 - 46.6 % 41.1  40.4  39.9   Platelets 150 - 450 x10E3/uL 389  376  418    Lab Results  Component Value Date   HGBA1C 5.8 01/04/2023    Lab Results  Component Value Date   TSH 0.820 04/22/2020     ROS  Review of Systems  Cardiovascular:  Positive for chest pain and palpitations. Negative for dyspnea on exertion and leg swelling.    Physical Exam:   VS:  BP (!) 144/76 (BP Location: Right Arm, Patient Position: Sitting, Cuff  Size: Normal)   Pulse 67   Ht 5\' 2"  (1.575 m)   Wt 156 lb 12.8 oz (71.1 kg)   SpO2 99%   BMI 28.68 kg/m    Wt Readings from Last 3 Encounters:  08/13/23 156 lb 12.8 oz (71.1 kg)  06/24/23 157 lb 9.6 oz (71.5 kg)  06/07/23 161 lb (73 kg)    Physical Exam Neck:     Vascular: No carotid bruit or JVD.  Cardiovascular:     Rate and Rhythm: Normal rate and regular rhythm.     Pulses: Intact distal pulses.     Heart sounds: Normal heart sounds. No murmur heard.    No gallop.  Pulmonary:     Effort: Pulmonary effort is normal.     Breath sounds: Normal breath sounds.  Abdominal:     General: Bowel sounds are normal.     Palpations: Abdomen is soft.  Musculoskeletal:     Right lower leg: No edema.     Left lower leg: No edema.    Studies Reviewed: [name]    CARDIAC CATHETERIZATION 03/01/2017  Ost Ramus lesion is 80% stenosed.  A drug-eluting stent was successfully placed using a STENT PROMUS PREM MR 3.0X12.  ECHOCARDIOGRAM COMPLETE 11/07/2020  1. Left ventricular ejection fraction, by estimation, is 60 to 65%. The left ventricle has normal function. The left ventricle has no regional wall motion abnormalities. Left ventricular diastolic parameters were normal. The average left ventricular global longitudinal strain is -24.5 %. The global longitudinal strain is normal. 2. Right ventricular systolic function is normal. The right ventricular size is normal.  EKG:    EKG Interpretation Date/Time:  Friday Aug 13 2023 13:25:47 EDT Ventricular Rate:  67 PR Interval:  140 QRS Duration:  74 QT Interval:  428 QTC Calculation: 452 R Axis:   -2  Text Interpretation: EKG 08/13/2023: Normal sinus rhythm at the rate of 67 bpm, normal EKG.  Compared to 01/25/2023, no significant change. Confirmed by Kharee Lesesne, Jagadeesh ([id number]) on 08/13/2023 1:47:55 PM    Medications and allergies    No Known Allergies   Current Outpatient Medications:    ammonium lactate  (LAC-HYDRIN ) 12 % lotion, APPLY 1  APPLICATION TOPICALLY AS NEEDED FOR DRY SKIN., Disp: 400 mL, Rfl: 1   aspirin  81 MG tablet, Take 81 mg by mouth daily., Disp: , Rfl:    atorvastatin  (LIPITOR) 80 MG tablet, Take 1 tablet (80 mg total) by mouth daily., Disp: 90 tablet, Rfl: 3   cycloSPORINE (RESTASIS) 0.05 % ophthalmic emulsion, 1 drop 2 (two) times daily. Uses PRN, Disp: , Rfl:    ezetimibe  (ZETIA ) 10 MG tablet, Take 1 tablet (  10 mg total) by mouth daily., Disp: 90 tablet, Rfl: 3   ferrous sulfate  325 (65 FE) MG tablet, TAKE 1 TABLET BY MOUTH EVERY DAY WITH BREAKFAST, Disp: 90 tablet, Rfl: 1   fluticasone  (FLONASE ) 50 MCG/ACT nasal spray, Place 2 sprays into both nostrils daily., Disp: 16 g, Rfl: 4   gabapentin  (NEURONTIN ) 100 MG capsule, Take 1 capsule (100 mg total) by mouth every morning AND 1 capsule (100 mg total) daily with lunch AND 2 capsules (200 mg total) at bedtime., Disp: 120 capsule, Rfl: 3   hydrOXYzine  (ATARAX ) 10 MG tablet, Take 1 tablet (10 mg total) by mouth 3 (three) times daily as needed., Disp: 60 tablet, Rfl: 1   isosorbide  mononitrate (IMDUR ) 30 MG 24 hr tablet, Take 1 tablet (30 mg total) by mouth daily., Disp: 90 tablet, Rfl: 0   metoprolol  succinate (TOPROL  XL) 50 MG 24 hr tablet, Take 1 tablet (50 mg total) by mouth daily with or immediately following a meal., Disp: 90 tablet, Rfl: 0   nitroGLYCERIN  (NITROSTAT ) 0.4 MG SL tablet, Place 1 tablet (0.4 mg total) under the tongue every 5 (five) minutes as needed for chest pain. DISSOLVE 1 TABLET UNDER THE TONGUE EVERY 5 MINUTES AS NEEDED FOR CHEST PAIN. DO NOT EXCEED A TOTAL OF 3 DOSES IN 15 MINUTES. (ORIG), Disp: 25 tablet, Rfl: 3   pantoprazole  (PROTONIX ) 40 MG tablet, Take 1 tablet (40 mg total) by mouth 2 (two) times daily., Disp: 180 tablet, Rfl: 3   Polyethyl Glycol-Propyl Glycol (SYSTANE) 0.4-0.3 % GEL ophthalmic gel, Place 1 Application into both eyes., Disp: , Rfl:    polyethylene glycol powder (GLYCOLAX /MIRALAX ) 17 GM/SCOOP powder, Take 17 g by mouth 2  (two) times daily as needed., Disp: 3350 g, Rfl: 1   PREMARIN  vaginal cream, PLACE 1 APPLICATORFUL VAGINALLY DAILY., Disp: 30 g, Rfl: 12   Semaglutide , 1 MG/DOSE, 4 MG/3ML SOPN, Inject 1 mg into the skin once a week., Disp: , Rfl:    sertraline  (ZOLOFT ) 100 MG tablet, Take 1 tablet (100 mg total) by mouth daily., Disp: 90 tablet, Rfl: 1   sertraline  (ZOLOFT ) 50 MG tablet, Take 1 tablet (50 mg total) by mouth daily., Disp: 90 tablet, Rfl: 1   terbinafine  (LAMISIL ) 250 MG tablet, Take 1 tablet (250 mg total) by mouth daily., Disp: 30 tablet, Rfl: 0   tizanidine  (ZANAFLEX ) 2 MG capsule, TAKE 1-2 CAPSULES (2-4 MG TOTAL) BY MOUTH 3 (THREE) TIMES DAILY AS NEEDED FOR MUSCLE SPASMS., Disp: 40 capsule, Rfl: 0   valsartan  (DIOVAN ) 40 MG tablet, Take 1 tablet (40 mg total) by mouth daily., Disp: 90 tablet, Rfl: 3  Current Facility-Administered Medications:    diclofenac  Sodium (VOLTAREN ) 1 % topical gel 2 g, 2 g, Topical, PRN, Sowell, Brandon, MD   Meds ordered this encounter  Medications   isosorbide  mononitrate (IMDUR ) 30 MG 24 hr tablet    Sig: Take 1 tablet (30 mg total) by mouth daily.    Dispense:  90 tablet    Refill:  0   metoprolol  succinate (TOPROL  XL) 50 MG 24 hr tablet    Sig: Take 1 tablet (50 mg total) by mouth daily with or immediately following a meal.    Dispense:  90 tablet    Refill:  0    Dose change (increased on 08/13/23).     Medications Discontinued During This Encounter  Medication Reason   metoprolol  succinate (TOPROL -XL) 25 MG 24 hr tablet Dose change     ASSESSMENT AND PLAN: .  ICD-10-CM   1. Coronary artery disease of native artery of native heart with stable angina pectoris (HCC)  I25.118 EKG 12-Lead    2. Palpitations  R00.2     3. Essential hypertension  I10     4. Hypercholesterolemia  E78.00     5. Pre-procedure lab exam  [phone] Basic metabolic panel with GFR    CBC     Assessment & Plan Angina with coronary artery disease   She experiences  intermittent chest discomfort radiating to the left arm, triggered by exertion and emotional stress. A stent was placed in the ramus intermediate branch in 2018. Symptoms suggest possible recurrent coronary artery blockage. There is a risk of less than 1% for death, stroke, myocardial infarction, or urgent bypass surgery with heart catheterization. Symptoms warrant further investigation. Order heart catheterization on Monday to assess stent patency and coronary artery status. Increase metoprolol  succinate to 50 mg once daily. Continue aspirin  81 mg once daily. Prescribe isosorbide  mononitrate 30 mg once daily. Advise avoiding heavy exertion and emotional stress. Instruct to use nitroglycerin  as needed for chest pain and to go to the ED if chest pain persists despite nitroglycerin  use.  Palpitations   She has a recent onset of palpitations with episodes of tachycardia, diaphoresis, and lightheadedness, occurring with exertion and emotional stress. Increasing metoprolol  may help manage these symptoms. Increase metoprolol  succinate to 50 mg once daily.  Hypertension   Her blood pressure is elevated, likely exacerbated by emotional stress. Increasing metoprolol  may aid in blood pressure control. Increase metoprolol  succinate to 50 mg once daily.  Hypercholesterolemia   Cholesterol levels are well-controlled with the current regimen. The last LDL was 55, indicating effective management with Lipitor and Zetia . Continue Lipitor 80 mg once daily and Zetia  10 mg once daily.  Type 2 diabetes mellitus without complications   Diabetes is well-controlled with the current regimen. She is on Ozempic  and reports stable blood glucose levels. Schedule for cardiac catheterization, and possible angioplasty. We discussed regarding risks, benefits, alternatives to this including stress testing, CTA and continued medical therapy. Patient wants to proceed. Understands <1-2% risk of death, stroke, MI, urgent CABG, bleeding,  infection, renal failure but not limited to these.  Reviewed her BMP, CBC she has microcytic indicis but no anemia and renal function is normal and electrolytes are normal.  Signed,  Knox Perl, MD, [facility] 08/14/2023, 7:44 AM [facility] [address] [place], [place] [address] Phone: [phone]. Fax:  [phone]

## 2023-08-13 NOTE — Progress Notes (Signed)
 Cardiology Office Note:  .   Date:  08/14/2023  ID:  [name] [name], DOB [birth year], MRN [id number] PCP: Wilhemena Harbour, MD  [facility] Providers Cardiologist:  Alexandria Angel, MD   History of Present Illness: .   [name] [name] is a 73 y.o. hypertension, CAD (s/p DES to the ostial ramus in 2018), GERD, DM, HLD, palpitations, orthostatic hypotension, anxiety and depression.  Seen on an urgent basis due to new onset of chest pain and palpitations that started about a week to 10 days ago.  Symptoms described as chest tightness with radiation to the left arm with exertional activity limiting her activity significantly and relieved with rest and also chest pain comes on with stress.  Palpitation symptoms also started a week ago but they are not concurrent.  Describes it as lasting a few minutes but mostly few seconds at any time with no precipitating factors.  Discussed the use of AI scribe software for clinical note transcription with the patient, who gave verbal consent to proceed.  History of Present Illness She normally follows up with Dr. Audery Blazing.  She has experienced chest discomfort and palpitations for the past week. Palpitations are frequent, with episodes of tachycardia and diaphoresis, often accompanied by lightheadedness. The initial episode occurred during light activity, lasting about a minute, with relief upon resting. Symptoms have improved with increased rest over the past two days.  Chest discomfort, described as heaviness radiating to the arm, began last week. Episodes occur a few times weekly, lasting a couple of minutes, sometimes triggered by positional changes or mild exertion, such as walking to the clinic today. Symptoms started 1 week ago.   She underwent stent placement in 2018 in the ramus intermediate branch and has experienced similar symptoms previously. Current medications include metoprolol  succinate, aspirin , Lipitor, and Zetia . She recently  started using nitroglycerin  as needed.  Blood pressure readings have fluctuated, with recent measurements of 150/unknown and 116/unknown. Emotional stress, such as arguments, triggers chest discomfort and palpitations. Diabetes is managed with Ozempic , and blood sugar levels are stable. She quit smoking years ago and engages in light physical activities with frequent breaks.  Labs   Lab Results  Component Value Date   CHOL 128 04/21/2022   HDL 51 04/21/2022   LDLCALC 55 04/21/2022   LDLDIRECT 129 (H) 01/01/2010   TRIG 122 04/21/2022   CHOLHDL 2.5 04/21/2022   Lab Results  Component Value Date   NA 141 08/13/2023   K 4.7 08/13/2023   CO2 19 (L) 08/13/2023   GLUCOSE 80 08/13/2023   BUN 13 08/13/2023   CREATININE 0.99 08/13/2023   CALCIUM  9.9 08/13/2023   EGFR 60 08/13/2023   GFRNONAA >60 12/15/2020      Latest Ref Rng & Units 08/13/2023    3:04 PM 01/04/2023    4:45 PM 07/09/2021   11:48 AM  BMP  Glucose 70 - 99 mg/dL 80  74  782   BUN 8 - 27 mg/dL 13  11  13    Creatinine 0.57 - 1.00 mg/dL 9.56  2.13  0.86   BUN/Creat Ratio 12 - 28 13  11  13    Sodium 134 - 144 mmol/L 141  142  142   Potassium 3.5 - 5.2 mmol/L 4.7  4.3  4.1   Chloride 96 - 106 mmol/L 105  104  106   CO2 20 - 29 mmol/L 19  22  24    Calcium  8.7 - 10.3 mg/dL 9.9  9.5  9.6  Latest Ref Rng & Units 08/13/2023    3:04 PM 07/28/2022   12:29 PM 04/20/2022    2:32 PM  CBC  WBC 3.4 - 10.8 x10E3/uL 7.7  6.9  7.4   Hemoglobin 11.1 - 15.9 g/dL 16.1  09.6  04.5   Hematocrit 34.0 - 46.6 % 41.1  40.4  39.9   Platelets 150 - 450 x10E3/uL 389  376  418    Lab Results  Component Value Date   HGBA1C 5.8 01/04/2023    Lab Results  Component Value Date   TSH 0.820 04/22/2020     ROS  Review of Systems  Cardiovascular:  Positive for chest pain and palpitations. Negative for dyspnea on exertion and leg swelling.    Physical Exam:   VS:  BP (!) 144/76 (BP Location: Right Arm, Patient Position: Sitting, Cuff  Size: Normal)   Pulse 67   Ht 5\' 2"  (1.575 m)   Wt 156 lb 12.8 oz (71.1 kg)   SpO2 99%   BMI 28.68 kg/m    Wt Readings from Last 3 Encounters:  08/13/23 156 lb 12.8 oz (71.1 kg)  06/24/23 157 lb 9.6 oz (71.5 kg)  06/07/23 161 lb (73 kg)    Physical Exam Neck:     Vascular: No carotid bruit or JVD.  Cardiovascular:     Rate and Rhythm: Normal rate and regular rhythm.     Pulses: Intact distal pulses.     Heart sounds: Normal heart sounds. No murmur heard.    No gallop.  Pulmonary:     Effort: Pulmonary effort is normal.     Breath sounds: Normal breath sounds.  Abdominal:     General: Bowel sounds are normal.     Palpations: Abdomen is soft.  Musculoskeletal:     Right lower leg: No edema.     Left lower leg: No edema.    Studies Reviewed: [name]    CARDIAC CATHETERIZATION 03/01/2017  Ost Ramus lesion is 80% stenosed.  A drug-eluting stent was successfully placed using a STENT PROMUS PREM MR 3.0X12.  ECHOCARDIOGRAM COMPLETE 11/07/2020  1. Left ventricular ejection fraction, by estimation, is 60 to 65%. The left ventricle has normal function. The left ventricle has no regional wall motion abnormalities. Left ventricular diastolic parameters were normal. The average left ventricular global longitudinal strain is -24.5 %. The global longitudinal strain is normal. 2. Right ventricular systolic function is normal. The right ventricular size is normal.  EKG:    EKG Interpretation Date/Time:  Friday Aug 13 2023 13:25:47 EDT Ventricular Rate:  67 PR Interval:  140 QRS Duration:  74 QT Interval:  428 QTC Calculation: 452 R Axis:   -2  Text Interpretation: EKG 08/13/2023: Normal sinus rhythm at the rate of 67 bpm, normal EKG.  Compared to 01/25/2023, no significant change. Confirmed by Kharee Lesesne, Jagadeesh ([id number]) on 08/13/2023 1:47:55 PM    Medications and allergies    No Known Allergies   Current Outpatient Medications:    ammonium lactate  (LAC-HYDRIN ) 12 % lotion, APPLY 1  APPLICATION TOPICALLY AS NEEDED FOR DRY SKIN., Disp: 400 mL, Rfl: 1   aspirin  81 MG tablet, Take 81 mg by mouth daily., Disp: , Rfl:    atorvastatin  (LIPITOR) 80 MG tablet, Take 1 tablet (80 mg total) by mouth daily., Disp: 90 tablet, Rfl: 3   cycloSPORINE (RESTASIS) 0.05 % ophthalmic emulsion, 1 drop 2 (two) times daily. Uses PRN, Disp: , Rfl:    ezetimibe  (ZETIA ) 10 MG tablet, Take 1 tablet (  10 mg total) by mouth daily., Disp: 90 tablet, Rfl: 3   ferrous sulfate  325 (65 FE) MG tablet, TAKE 1 TABLET BY MOUTH EVERY DAY WITH BREAKFAST, Disp: 90 tablet, Rfl: 1   fluticasone  (FLONASE ) 50 MCG/ACT nasal spray, Place 2 sprays into both nostrils daily., Disp: 16 g, Rfl: 4   gabapentin  (NEURONTIN ) 100 MG capsule, Take 1 capsule (100 mg total) by mouth every morning AND 1 capsule (100 mg total) daily with lunch AND 2 capsules (200 mg total) at bedtime., Disp: 120 capsule, Rfl: 3   hydrOXYzine  (ATARAX ) 10 MG tablet, Take 1 tablet (10 mg total) by mouth 3 (three) times daily as needed., Disp: 60 tablet, Rfl: 1   isosorbide  mononitrate (IMDUR ) 30 MG 24 hr tablet, Take 1 tablet (30 mg total) by mouth daily., Disp: 90 tablet, Rfl: 0   metoprolol  succinate (TOPROL  XL) 50 MG 24 hr tablet, Take 1 tablet (50 mg total) by mouth daily with or immediately following a meal., Disp: 90 tablet, Rfl: 0   nitroGLYCERIN  (NITROSTAT ) 0.4 MG SL tablet, Place 1 tablet (0.4 mg total) under the tongue every 5 (five) minutes as needed for chest pain. DISSOLVE 1 TABLET UNDER THE TONGUE EVERY 5 MINUTES AS NEEDED FOR CHEST PAIN. DO NOT EXCEED A TOTAL OF 3 DOSES IN 15 MINUTES. (ORIG), Disp: 25 tablet, Rfl: 3   pantoprazole  (PROTONIX ) 40 MG tablet, Take 1 tablet (40 mg total) by mouth 2 (two) times daily., Disp: 180 tablet, Rfl: 3   Polyethyl Glycol-Propyl Glycol (SYSTANE) 0.4-0.3 % GEL ophthalmic gel, Place 1 Application into both eyes., Disp: , Rfl:    polyethylene glycol powder (GLYCOLAX /MIRALAX ) 17 GM/SCOOP powder, Take 17 g by mouth 2  (two) times daily as needed., Disp: 3350 g, Rfl: 1   PREMARIN  vaginal cream, PLACE 1 APPLICATORFUL VAGINALLY DAILY., Disp: 30 g, Rfl: 12   Semaglutide , 1 MG/DOSE, 4 MG/3ML SOPN, Inject 1 mg into the skin once a week., Disp: , Rfl:    sertraline  (ZOLOFT ) 100 MG tablet, Take 1 tablet (100 mg total) by mouth daily., Disp: 90 tablet, Rfl: 1   sertraline  (ZOLOFT ) 50 MG tablet, Take 1 tablet (50 mg total) by mouth daily., Disp: 90 tablet, Rfl: 1   terbinafine  (LAMISIL ) 250 MG tablet, Take 1 tablet (250 mg total) by mouth daily., Disp: 30 tablet, Rfl: 0   tizanidine  (ZANAFLEX ) 2 MG capsule, TAKE 1-2 CAPSULES (2-4 MG TOTAL) BY MOUTH 3 (THREE) TIMES DAILY AS NEEDED FOR MUSCLE SPASMS., Disp: 40 capsule, Rfl: 0   valsartan  (DIOVAN ) 40 MG tablet, Take 1 tablet (40 mg total) by mouth daily., Disp: 90 tablet, Rfl: 3  Current Facility-Administered Medications:    diclofenac  Sodium (VOLTAREN ) 1 % topical gel 2 g, 2 g, Topical, PRN, Sowell, Brandon, MD   Meds ordered this encounter  Medications   isosorbide  mononitrate (IMDUR ) 30 MG 24 hr tablet    Sig: Take 1 tablet (30 mg total) by mouth daily.    Dispense:  90 tablet    Refill:  0   metoprolol  succinate (TOPROL  XL) 50 MG 24 hr tablet    Sig: Take 1 tablet (50 mg total) by mouth daily with or immediately following a meal.    Dispense:  90 tablet    Refill:  0    Dose change (increased on 08/13/23).     Medications Discontinued During This Encounter  Medication Reason   metoprolol  succinate (TOPROL -XL) 25 MG 24 hr tablet Dose change     ASSESSMENT AND PLAN: .  ICD-10-CM   1. Coronary artery disease of native artery of native heart with stable angina pectoris (HCC)  I25.118 EKG 12-Lead    2. Palpitations  R00.2     3. Essential hypertension  I10     4. Hypercholesterolemia  E78.00     5. Pre-procedure lab exam  [phone] Basic metabolic panel with GFR    CBC     Assessment & Plan Angina with coronary artery disease   She experiences  intermittent chest discomfort radiating to the left arm, triggered by exertion and emotional stress. A stent was placed in the ramus intermediate branch in 2018. Symptoms suggest possible recurrent coronary artery blockage. There is a risk of less than 1% for death, stroke, myocardial infarction, or urgent bypass surgery with heart catheterization. Symptoms warrant further investigation. Order heart catheterization on Monday to assess stent patency and coronary artery status. Increase metoprolol  succinate to 50 mg once daily. Continue aspirin  81 mg once daily. Prescribe isosorbide  mononitrate 30 mg once daily. Advise avoiding heavy exertion and emotional stress. Instruct to use nitroglycerin  as needed for chest pain and to go to the ED if chest pain persists despite nitroglycerin  use.  Palpitations   She has a recent onset of palpitations with episodes of tachycardia, diaphoresis, and lightheadedness, occurring with exertion and emotional stress. Increasing metoprolol  may help manage these symptoms. Increase metoprolol  succinate to 50 mg once daily.  Hypertension   Her blood pressure is elevated, likely exacerbated by emotional stress. Increasing metoprolol  may aid in blood pressure control. Increase metoprolol  succinate to 50 mg once daily.  Hypercholesterolemia   Cholesterol levels are well-controlled with the current regimen. The last LDL was 55, indicating effective management with Lipitor and Zetia . Continue Lipitor 80 mg once daily and Zetia  10 mg once daily.  Type 2 diabetes mellitus without complications   Diabetes is well-controlled with the current regimen. She is on Ozempic  and reports stable blood glucose levels. Schedule for cardiac catheterization, and possible angioplasty. We discussed regarding risks, benefits, alternatives to this including stress testing, CTA and continued medical therapy. Patient wants to proceed. Understands <1-2% risk of death, stroke, MI, urgent CABG, bleeding,  infection, renal failure but not limited to these.  Reviewed her BMP, CBC she has microcytic indicis but no anemia and renal function is normal and electrolytes are normal.  Signed,  Knox Perl, MD, [facility] 08/14/2023, 7:44 AM [facility] [address] [place], [place] [address] Phone: [phone]. Fax:  [phone]

## 2023-08-14 ENCOUNTER — Ambulatory Visit: Payer: Self-pay | Admitting: Cardiology

## 2023-08-14 LAB — BASIC METABOLIC PANEL WITH GFR
BUN/Creatinine Ratio: 13 (ref 12–28)
BUN: 13 mg/dL (ref 8–27)
CO2: 19 mmol/L — ABNORMAL LOW (ref 20–29)
Calcium: 9.9 mg/dL (ref 8.7–10.3)
Chloride: 105 mmol/L (ref 96–106)
Creatinine, Ser: 0.99 mg/dL (ref 0.57–1.00)
Glucose: 80 mg/dL (ref 70–99)
Potassium: 4.7 mmol/L (ref 3.5–5.2)
Sodium: 141 mmol/L (ref 134–144)
eGFR: 60 mL/min/{1.73_m2} (ref >59)

## 2023-08-14 LAB — CBC
Hematocrit: 41.1 % (ref 34.0–46.6)
Hemoglobin: 12.3 g/dL (ref 11.1–15.9)
MCH: 22 pg — ABNORMAL LOW (ref 26.6–33.0)
MCHC: 29.9 g/dL — ABNORMAL LOW (ref 31.5–35.7)
MCV: 73 fL — ABNORMAL LOW (ref 79–97)
Platelets: 389 10*3/uL (ref 150–450)
RBC: 5.6 x10E6/uL — ABNORMAL HIGH (ref 3.77–5.28)
RDW: 15.8 % — ABNORMAL HIGH (ref 11.7–15.4)
WBC: 7.7 10*3/uL (ref 3.4–10.8)

## 2023-08-16 ENCOUNTER — Encounter (HOSPITAL_COMMUNITY)
Admission: RE | Disposition: A | Discharge status: Home / Self Care | Payer: Self-pay | Source: Home / Self Care | Attending: Cardiology

## 2023-08-16 ENCOUNTER — Other Ambulatory Visit: Payer: Self-pay

## 2023-08-16 ENCOUNTER — Encounter (HOSPITAL_COMMUNITY): Payer: Self-pay | Admitting: Cardiology

## 2023-08-16 ENCOUNTER — Ambulatory Visit (HOSPITAL_COMMUNITY)
Admission: RE | Admit: 2023-08-16 | Discharge: 2023-08-16 | Disposition: A | Discharge status: Home / Self Care | Attending: Cardiology | Admitting: Cardiology

## 2023-08-16 DIAGNOSIS — Z7985 Long-term (current) use of injectable non-insulin antidiabetic drugs: Secondary | ICD-10-CM | POA: Insufficient documentation

## 2023-08-16 DIAGNOSIS — Z87891 Personal history of nicotine dependence: Secondary | ICD-10-CM | POA: Diagnosis not present

## 2023-08-16 DIAGNOSIS — Z955 Presence of coronary angioplasty implant and graft: Secondary | ICD-10-CM | POA: Insufficient documentation

## 2023-08-16 DIAGNOSIS — F32A Depression, unspecified: Secondary | ICD-10-CM | POA: Diagnosis not present

## 2023-08-16 DIAGNOSIS — R002 Palpitations: Secondary | ICD-10-CM | POA: Diagnosis not present

## 2023-08-16 DIAGNOSIS — E7800 Pure hypercholesterolemia, unspecified: Secondary | ICD-10-CM | POA: Insufficient documentation

## 2023-08-16 DIAGNOSIS — F419 Anxiety disorder, unspecified: Secondary | ICD-10-CM | POA: Insufficient documentation

## 2023-08-16 DIAGNOSIS — Z79899 Other long term (current) drug therapy: Secondary | ICD-10-CM | POA: Diagnosis not present

## 2023-08-16 DIAGNOSIS — K219 Gastro-esophageal reflux disease without esophagitis: Secondary | ICD-10-CM | POA: Diagnosis not present

## 2023-08-16 DIAGNOSIS — I25118 Atherosclerotic heart disease of native coronary artery with other forms of angina pectoris: Secondary | ICD-10-CM | POA: Insufficient documentation

## 2023-08-16 DIAGNOSIS — E119 Type 2 diabetes mellitus without complications: Secondary | ICD-10-CM | POA: Diagnosis not present

## 2023-08-16 DIAGNOSIS — I209 Angina pectoris, unspecified: Secondary | ICD-10-CM

## 2023-08-16 DIAGNOSIS — I10 Essential (primary) hypertension: Secondary | ICD-10-CM | POA: Diagnosis not present

## 2023-08-16 DIAGNOSIS — R079 Chest pain, unspecified: Secondary | ICD-10-CM | POA: Diagnosis present

## 2023-08-16 DIAGNOSIS — Z7982 Long term (current) use of aspirin: Secondary | ICD-10-CM | POA: Diagnosis not present

## 2023-08-16 HISTORY — PX: LEFT HEART CATH AND CORONARY ANGIOGRAPHY: CATH118249

## 2023-08-16 SURGERY — LEFT HEART CATH AND CORONARY ANGIOGRAPHY
Anesthesia: LOCAL

## 2023-08-16 MED ORDER — MIDAZOLAM HCL 2 MG/2ML IJ SOLN
INTRAMUSCULAR | Status: DC | PRN
  Administered 2023-08-16: 1 mg via INTRAVENOUS
  Administered 2023-08-16: 2 mg via INTRAVENOUS

## 2023-08-16 MED ORDER — ASPIRIN 81 MG PO CHEW: 81.0000 mg | CHEWABLE_TABLET | ORAL | Status: DC

## 2023-08-16 MED ORDER — VERAPAMIL HCL 2.5 MG/ML IV SOLN
INTRAVENOUS | Status: AC
  Filled 2023-08-16: qty 2

## 2023-08-16 MED ORDER — VERAPAMIL HCL 2.5 MG/ML IV SOLN
INTRAVENOUS | Status: DC | PRN
  Administered 2023-08-16: 10 mL via INTRA_ARTERIAL

## 2023-08-16 MED ORDER — LIDOCAINE HCL (PF) 1 % IJ SOLN
INTRAMUSCULAR | Status: AC
  Filled 2023-08-16: qty 30

## 2023-08-16 MED ORDER — SODIUM CHLORIDE 0.9 % WEIGHT BASED INFUSION: 1.0000 mL/kg/h | INTRAVENOUS | Status: DC

## 2023-08-16 MED ORDER — HEPARIN (PORCINE) IN NACL 2000-0.9 UNIT/L-% IV SOLN
INTRAVENOUS | Status: DC | PRN
  Administered 2023-08-16: 1000 mL

## 2023-08-16 MED ORDER — IOHEXOL 350 MG/ML SOLN
INTRAVENOUS | Status: DC | PRN
  Administered 2023-08-16: 20 mL

## 2023-08-16 MED ORDER — MIDAZOLAM HCL 2 MG/2ML IJ SOLN
INTRAMUSCULAR | Status: AC
  Filled 2023-08-16: qty 2

## 2023-08-16 MED ORDER — FENTANYL CITRATE (PF) 100 MCG/2ML IJ SOLN
INTRAMUSCULAR | Status: AC
  Filled 2023-08-16: qty 2

## 2023-08-16 MED ORDER — LIDOCAINE HCL (PF) 1 % IJ SOLN
INTRAMUSCULAR | Status: DC | PRN
  Administered 2023-08-16: 2 mL

## 2023-08-16 MED ORDER — AMLODIPINE BESYLATE 5 MG PO TABS
5.0000 mg | ORAL_TABLET | Freq: Every day | ORAL | 2 refills | Status: DC
Start: 2023-08-16 — End: 2023-11-12

## 2023-08-16 MED ORDER — HEPARIN SODIUM (PORCINE) 1000 UNIT/ML IJ SOLN
INTRAMUSCULAR | Status: DC | PRN
  Administered 2023-08-16: 6000 [IU] via INTRAVENOUS

## 2023-08-16 MED ORDER — HEPARIN (PORCINE) IN NACL 1000-0.9 UT/500ML-% IV SOLN
INTRAVENOUS | Status: DC | PRN
Start: 2023-08-16 — End: 2023-08-16
  Administered 2023-08-16: 500 mL

## 2023-08-16 MED ORDER — FENTANYL CITRATE (PF) 100 MCG/2ML IJ SOLN
INTRAMUSCULAR | Status: DC | PRN
  Administered 2023-08-16: 25 ug via INTRAVENOUS

## 2023-08-16 MED ORDER — HEPARIN SODIUM (PORCINE) 1000 UNIT/ML IJ SOLN
INTRAMUSCULAR | Status: AC
  Filled 2023-08-16: qty 10

## 2023-08-16 MED ORDER — SODIUM CHLORIDE 0.9 % WEIGHT BASED INFUSION: 3.0000 mL/kg/h | INTRAVENOUS | Status: AC

## 2023-08-16 SURGICAL SUPPLY — 9 items
CATH INFINITI AMBI 5FR TG (CATHETERS) IMPLANT
DEVICE RAD COMP TR BAND LRG (VASCULAR PRODUCTS) IMPLANT
GLIDESHEATH SLEND SS 6F .021 (SHEATH) IMPLANT
GUIDEWIRE INQWIRE 1.5J.035X260 (WIRE) IMPLANT
KIT ENCORE 26 ADVANTAGE (KITS) IMPLANT
KIT HEMO VALVE WATCHDOG (MISCELLANEOUS) IMPLANT
PACK CARDIAC CATHETERIZATION (CUSTOM PROCEDURE TRAY) ×1 IMPLANT
SET ATX-X65L (MISCELLANEOUS) IMPLANT
TUBING CIL FLEX 10 FLL-RA (TUBING) IMPLANT

## 2023-08-16 NOTE — Interval H&P Note (Signed)
 History and Physical Interval Note:  08/16/2023 7:42 AM  [name] [name]  has presented today for surgery, with the diagnosis of chest pain.  The various methods of treatment have been discussed with the patient and family. After consideration of risks, benefits and other options for treatment, the patient has consented to  Procedure(s): LEFT HEART CATH AND CORONARY ANGIOGRAPHY (N/A) as a surgical intervention.  The patient's history has been reviewed, patient examined, no change in status, stable for surgery.  I have reviewed the patient's chart and labs.  Questions were answered to the patient's satisfaction.     Cath Lab Visit (complete for each Cath Lab visit)  Clinical Evaluation Leading to the Procedure:   ACS: No.  Non-ACS:    Anginal Classification: CCS III  Anti-ischemic medical therapy: Maximal Therapy (2 or more classes of medications)  Non-Invasive Test Results: No non-invasive testing performed  Prior CABG: No previous CABG   Knox Perl

## 2023-08-16 NOTE — Discharge Instructions (Signed)
 Radial Site Care The following information offers guidance on how to care for yourself after your procedure. Your health care provider may also give you more specific instructions. If you have problems or questions, contact your health care provider. What can I expect after the procedure? After the procedure, it is common to have bruising and tenderness in the incision area. Follow these instructions at home: Incision site care  Follow instructions from your health care provider about how to take care of your incision site. Make sure you: Wash your hands with soap and water for at least 20 seconds before and after you change your bandage (dressing). If soap and water are not available, use hand sanitizer. Remove your dressing in 24 hours. Leave stitches (sutures), skin glue, or adhesive strips in place. These skin closures may need to stay in place for 2 weeks or longer. If adhesive strip edges start to loosen and curl up, you may trim the loose edges. Do not remove adhesive strips completely unless your health care provider tells you to do that. Do not take baths, swim, or use a hot tub for at least 1 week. You may shower 24 hours after the procedure or as told by your health care provider. Remove the dressing and gently wash the incision area with plain soap and water. Pat the area dry with a clean towel. Do not rub the site. That could cause bleeding. Do not apply powder or lotion to the site. Check your incision site every day for signs of infection. Check for: Redness, swelling, or pain. Fluid or blood. Warmth. Pus or a bad smell. Activity For 24 hours after the procedure, or as directed by your health care provider: Do not flex or bend the affected arm. Do not push or pull heavy objects with the affected arm. Do not operate machinery or power tools. Do not drive. You should not drive yourself home from the hospital or clinic if you go home during that time period. You may drive 24  hours after the procedure unless your health care provider tells you not to. Do not lift anything that is heavier than 10 lb (4.5 kg), or the limit that you are told, until your health care provider says that it is safe. Return to your normal activities as told by your health care provider. Ask your health care provider what activities are safe for you and when you can return to work. If you were given a sedative during the procedure, it can affect you for several hours. Do not drive or operate machinery until your health care provider says that it is safe. General instructions Take over-the-counter and prescription medicines only as told by your health care provider. If you will be going home right after the procedure, plan to have a responsible adult care for you for the time you are told. This is important. Keep all follow-up visits. This is important. Contact a health care provider if: You have a fever or chills. You have any of these signs of infection at your incision site: Redness, swelling, or pain. Fluid or blood. Warmth. Pus or a bad smell. Get help right away if: The incision area swells very fast. The incision area is bleeding, and the bleeding does not stop when you hold steady pressure on the area. Your arm or hand becomes pale, cool, tingly, or numb. These symptoms may represent a serious problem that is an emergency. Do not wait to see if the symptoms will go away. Get medical  help right away. Call your local emergency services (911 in the [place] Do not drive yourself to the hospital. Summary After the procedure, it is common to have bruising and tenderness at the incision site. Follow instructions from your health care provider about how to take care of your radial site incision. Check the incision every day for signs of infection. Do not lift anything that is heavier than 10 lb (4.5 kg), or the limit that you are told, until your health care provider says that it is  safe. Get help right away if the incision area swells very fast, you have bleeding at the incision site that will not stop, or your arm or hand becomes pale, cool, or numb. This information is not intended to replace advice given to you by your health care provider. Make sure you discuss any questions you have with your health care provider. Document Revised: 05/05/2020 Document Reviewed: 05/05/2020 Elsevier Patient Education  2024 [employer].

## 2023-08-17 ENCOUNTER — Ambulatory Visit (INDEPENDENT_AMBULATORY_CARE_PROVIDER_SITE_OTHER): Admitting: Student

## 2023-08-17 ENCOUNTER — Encounter: Payer: Self-pay | Admitting: Student

## 2023-08-17 VITALS — BP 116/63 | HR 76 | Ht 62.0 in | Wt 154.4 lb

## 2023-08-17 DIAGNOSIS — Z79899 Other long term (current) drug therapy: Secondary | ICD-10-CM | POA: Insufficient documentation

## 2023-08-17 DIAGNOSIS — E7800 Pure hypercholesterolemia, unspecified: Secondary | ICD-10-CM | POA: Diagnosis not present

## 2023-08-17 DIAGNOSIS — I2510 Atherosclerotic heart disease of native coronary artery without angina pectoris: Secondary | ICD-10-CM

## 2023-08-17 DIAGNOSIS — I200 Unstable angina: Secondary | ICD-10-CM

## 2023-08-17 MED ORDER — ATORVASTATIN CALCIUM 80 MG PO TABS: 80.0000 mg | ORAL_TABLET | Freq: Every day | ORAL | 3 refills | Status: DC

## 2023-08-17 MED ORDER — EZETIMIBE 10 MG PO TABS: 10.0000 mg | ORAL_TABLET | Freq: Every day | ORAL | 3 refills | Status: AC

## 2023-08-17 NOTE — Progress Notes (Signed)
  SUBJECTIVE:   CHIEF COMPLAINT / HPI:   F/u cardiac cath -Pt had LHC yesterday for recurrent angina, w/ hx of stent placed in Rt Ost Ramus in 2018  Cardiac cath was negative per patient  Wanting to do Med Check  Hasn't taken lipitor and zetia  in 3-4 months  PERTINENT  PMH / PSH:   OBJECTIVE:  BP 116/63   Pulse 76   Ht 5\' 2"  (1.575 m)   Wt 154 lb 6 oz (70 kg)   SpO2 100%   BMI 28.24 kg/m  Physical Exam Constitutional:      General: She is not in acute distress.    Appearance: Normal appearance. She is not ill-appearing.  Cardiovascular:     Rate and Rhythm: Normal rate and regular rhythm.     Pulses: Normal pulses.     Heart sounds: Normal heart sounds. No murmur heard.    No friction rub. No gallop.  Pulmonary:     Effort: Pulmonary effort is normal. No respiratory distress.     Breath sounds: Normal breath sounds. No stridor. No wheezing, rhonchi or rales.  Abdominal:     General: There is no distension.     Palpations: Abdomen is soft. There is no mass.     Tenderness: There is no abdominal tenderness. There is no guarding or rebound.     Hernia: No hernia is present.  Neurological:     Mental Status: She is alert.  Psychiatric:        Mood and Affect: Mood normal.        Behavior: Behavior normal.      ASSESSMENT/PLAN:   Assessment & Plan Unstable angina [facility]) Patient comes in for follow-up after left heart cath, for angina.  Patient reports left heart cath was normal, no blocks were found.  Patient was told to take it easy by cardiologist, to avoid stress and strong activity.  Patient okay to take nitroglycerin  for symptoms.  Will recommend patient follow-up with cardiology if symptoms persist/reappear.  Cardiology may suspect symptoms are coming from muscular complaint/emotional complaint. - Follow-up with cardiology - Nitroglycerin  as needed - ED precautions for chest pain, shortness of breath, left-sided shoulder pain Medication management Patient  comes in for medication review, to go over meds and see what she needs to be on.  Patient taking most medications, but has not been taking Lipitor or Zetia  for 3 to 4 months.  Recommend restarting both meds, and will look through other meds to see if there is anything she can come off of.  Suspect she may not need to continue taking iron pills. Will call patient w/ updated med list. - Medication review per provider No follow-ups on file. Wilhemena Harbour, MD 08/17/2023, 2:26 PM PGY-3, [facility]

## 2023-08-17 NOTE — Patient Instructions (Signed)
 It was great to see you! Thank you for allowing me to participate in your care!  I recommend that you always bring your medications to each appointment as this makes it easy to ensure we are on the correct medications and helps us  not miss when refills are needed.  Our plans for today:  - Heart Cath  Am happy to hear that your heart cath went well.  Take it easy and try to avoid stressors either physical or emotional.  If you develop chest pain, shortness of breath, or arm/abdominal pain, proceed to emergency department.  -Medication review I will review your medications and let you know what you can stop taking/should continue taking.  Take care and seek immediate care sooner if you develop any concerns.   Dr. Wilhemena Harbour, MD [facility]

## 2023-08-17 NOTE — Assessment & Plan Note (Addendum)
 Patient comes in for follow-up after left heart cath, for angina.  Patient reports left heart cath was normal, no blocks were found.  Patient was told to take it easy by cardiologist, to avoid stress and strong activity.  Patient okay to take nitroglycerin  for symptoms.  Will recommend patient follow-up with cardiology if symptoms persist/reappear.  Cardiology may suspect symptoms are coming from muscular complaint/emotional complaint. - Follow-up with cardiology - Nitroglycerin  as needed - ED precautions for chest pain, shortness of breath, left-sided shoulder pain

## 2023-08-17 NOTE — Assessment & Plan Note (Addendum)
 Patient comes in for medication review, to go over meds and see what she needs to be on.  Patient taking most medications, but has not been taking Lipitor or Zetia  for 3 to 4 months.  Recommend restarting both meds, and will look through other meds to see if there is anything she can come off of.  Suspect she may not need to continue taking iron pills. Will call patient w/ updated med list. - Medication review per provider

## 2023-08-18 ENCOUNTER — Other Ambulatory Visit: Payer: Self-pay | Admitting: Student

## 2023-08-18 DIAGNOSIS — G63 Polyneuropathy in diseases classified elsewhere: Secondary | ICD-10-CM

## 2023-08-24 NOTE — Progress Notes (Signed)
 HPI: FU CAD. Also H/O MVP and orthostasis. Patient had PCI of the ramus intermedius December 2018.  Monitor August 2022 showed sinus rhythm with occasional PAC, brief PAT and rare PVC.  Echocardiogram August 2022 showed ejection fraction 60 to 65%.  Carotid Dopplers January 2023 showed 1 to 39% bilateral stenosis.  Cardiac catheterization May 2025 showed LVEDP 18, patent stent in the ramus intermedius and no other obstructive coronary disease.  Since last seen, she denies dyspnea, chest pain or syncope.  She has had episodes of her heart racing.  Current Outpatient Medications  Medication Sig Dispense Refill   amLODipine  (NORVASC ) 5 MG tablet Take 1 tablet (5 mg total) by mouth daily. 30 tablet 2   ammonium lactate  (LAC-HYDRIN ) 12 % lotion APPLY 1 APPLICATION TOPICALLY AS NEEDED FOR DRY SKIN. 400 mL 1   aspirin  81 MG tablet Take 81 mg by mouth daily.     atorvastatin  (LIPITOR) 80 MG tablet Take 1 tablet (80 mg total) by mouth daily. 90 tablet 3   cycloSPORINE (RESTASIS) 0.05 % ophthalmic emulsion 1 drop 2 (two) times daily. Uses PRN     ezetimibe  (ZETIA ) 10 MG tablet Take 1 tablet (10 mg total) by mouth daily. 90 tablet 3   fluticasone  (FLONASE ) 50 MCG/ACT nasal spray Place 2 sprays into both nostrils daily. 16 g 4   gabapentin  (NEURONTIN ) 100 MG capsule TAKE 1 CAPSULE BY MOUTH EVERY MORNING AND 1 CAPSULE DAILY WITH LUNCH AND 2 CAPSULES AT BEDTIME. 120 capsule 3   hydrOXYzine  (ATARAX ) 10 MG tablet Take 1 tablet (10 mg total) by mouth 3 (three) times daily as needed. 60 tablet 1   metoprolol  succinate (TOPROL  XL) 50 MG 24 hr tablet Take 1 tablet (50 mg total) by mouth daily with or immediately following a meal. 90 tablet 0   nitroGLYCERIN  (NITROSTAT ) 0.4 MG SL tablet Place 1 tablet (0.4 mg total) under the tongue every 5 (five) minutes as needed for chest pain. DISSOLVE 1 TABLET UNDER THE TONGUE EVERY 5 MINUTES AS NEEDED FOR CHEST PAIN. DO NOT EXCEED A TOTAL OF 3 DOSES IN 15 MINUTES. (ORIG) 25  tablet 3   pantoprazole  (PROTONIX ) 40 MG tablet Take 1 tablet (40 mg total) by mouth 2 (two) times daily. 180 tablet 3   Polyethyl Glycol-Propyl Glycol (SYSTANE) 0.4-0.3 % GEL ophthalmic gel Place 1 Application into both eyes.     polyethylene glycol powder (GLYCOLAX /MIRALAX ) 17 GM/SCOOP powder Take 17 g by mouth 2 (two) times daily as needed. 3350 g 1   PREMARIN  vaginal cream PLACE 1 APPLICATORFUL VAGINALLY DAILY. 30 g 12   Semaglutide , 1 MG/DOSE, 4 MG/3ML SOPN Inject 1 mg into the skin once a week.     sertraline  (ZOLOFT ) 100 MG tablet Take 1 tablet (100 mg total) by mouth daily. 90 tablet 1   sertraline  (ZOLOFT ) 50 MG tablet Take 1 tablet (50 mg total) by mouth daily. 90 tablet 1   terbinafine  (LAMISIL ) 250 MG tablet Take 1 tablet (250 mg total) by mouth daily. 30 tablet 0   tizanidine  (ZANAFLEX ) 2 MG capsule TAKE 1-2 CAPSULES (2-4 MG TOTAL) BY MOUTH 3 (THREE) TIMES DAILY AS NEEDED FOR MUSCLE SPASMS. 40 capsule 0   valsartan  (DIOVAN ) 40 MG tablet Take 1 tablet (40 mg total) by mouth daily. 90 tablet 3   No current facility-administered medications for this visit.     Past Medical History:  Diagnosis Date   Arthritis    Blood transfusion without reported diagnosis  Cataract    Coronary artery disease    Depression    Hyperlipidemia    Hypertension    Microcytic anemia    Mitral valve prolapse     Past Surgical History:  Procedure Laterality Date   ABDOMINAL HYSTERECTOMY     CARDIAC CATHETERIZATION     CATARACT EXTRACTION, BILATERAL     COLONOSCOPY     greater than 10 years ago   CORONARY PRESSURE/FFR STUDY N/A 03/01/2017   Procedure: INTRAVASCULAR PRESSURE WIRE/FFR STUDY;  Surgeon: Odie Benne, MD;  Location: [facility];  Service: Cardiovascular;  Laterality: N/A;   CORONARY STENT INTERVENTION N/A 03/01/2017   Procedure: CORONARY STENT INTERVENTION;  Surgeon: Odie Benne, MD;  Location: [facility];  Service: Cardiovascular;  Laterality:  N/A;   LEFT HEART CATH AND CORONARY ANGIOGRAPHY N/A 03/01/2017   Procedure: LEFT HEART CATH AND CORONARY ANGIOGRAPHY;  Surgeon: Odie Benne, MD;  Location: [facility];  Service: Cardiovascular;  Laterality: N/A;   LEFT HEART CATH AND CORONARY ANGIOGRAPHY N/A 08/16/2023   Procedure: LEFT HEART CATH AND CORONARY ANGIOGRAPHY;  Surgeon: Knox Perl, MD;  Location: [facility];  Service: Cardiovascular;  Laterality: N/A;   PARTIAL HYSTERECTOMY      Social History   Socioeconomic History   Marital status: Married    Spouse name: Adam   Number of children: 3   Years of education: 12   Highest education level: High school graduate  Occupational History    Comment: Retired  Tobacco Use   Smoking status: Former    Current packs/day: 0.00    Average packs/day: 0.5 packs/day for 20.0 years (10.0 ttl pk-yrs)    Types: Cigarettes    Start date: 07/30/1984    Quit date: 07/30/2004    Years since quitting: 19.1   Smokeless tobacco: Never  Vaping Use   Vaping status: Never Used  Substance and Sexual Activity   Alcohol use: Yes    Alcohol/week: 0.0 standard drinks of alcohol    Comment: Occasional   Drug use: No   Sexual activity: Yes    Partners: Male    Birth control/protection: Post-menopausal  Other Topics Concern   Not on file  Social History Narrative   Patient lives in [place] with her husband and niece.   Patient has 3 daughters she is close with. One lives right next door.    Patient enjoys sewing, cooking, reading, and outdoor activities.    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    Social Drivers of Corporate investment banker Strain: Low Risk  (04/21/2019)   Overall Financial Resource Strain (CARDIA)    Difficulty of Paying Living Expenses: Not hard at all  Food Insecurity: No Food Insecurity (04/21/2019)   Hunger Vital Sign    Worried About Running Out of Food in the Last Year: Never true    Ran Out of Food in the  Last Year: Never true  Transportation Needs: No Transportation Needs (04/21/2019)   PRAPARE - Administrator, Civil Service (Medical): No    Lack of Transportation (Non-Medical): No  Physical Activity: Sufficiently Active (04/21/2019)   Exercise Vital Sign    Days of Exercise per Week: 3 days    Minutes of Exercise per Session: 60 min  Stress: No Stress Concern Present (04/21/2019)   [employer] of Occupational Health - Occupational Stress Questionnaire    Feeling of Stress : Only  a little  Social Connections: Socially Integrated (04/21/2019)   Social Connection and Isolation Panel [NHANES]    Frequency of Communication with Friends and Family: More than three times a week    Frequency of Social Gatherings with Friends and Family: More than three times a week    Attends Religious Services: More than 4 times per year    Active Member of [employer] or Organizations: Yes    Attends Engineer, structural: More than 4 times per year    Marital Status: Married  Catering manager Violence: Not At Risk (04/21/2019)   Humiliation, Afraid, Rape, and Kick questionnaire    Fear of Current or Ex-Partner: No    Emotionally Abused: No    Physically Abused: No    Sexually Abused: No    Family History  Problem Relation Age of Onset   Heart disease Mother        CHF   Diabetes Mother    Hypertension Mother    Cancer Father    Hypertension Sister    Pancreatitis Sister    Dementia Sister    Esophageal cancer Sister    Esophageal cancer Brother    Colon cancer Neg Hx    Stomach cancer Neg Hx    Rectal cancer Neg Hx     ROS: no fevers or chills, productive cough, hemoptysis, dysphasia, odynophagia, melena, hematochezia, dysuria, hematuria, rash, seizure activity, orthopnea, PND, pedal edema, claudication. Remaining systems are negative.  Physical Exam: Well-developed well-nourished in no acute distress.  Skin is warm and dry.  HEENT is normal.  Neck is supple.  Chest is  clear to auscultation with normal expansion.  Cardiovascular exam is regular rate and rhythm.  Abdominal exam nontender or distended. No masses palpated. Extremities show no edema. neuro grossly intac  A/P  1 coronary artery disease-recent cardiac catheterization revealed no obstructive coronary disease and patent stent in the ramus intermedius.  Continue aspirin  and statin.  2 hyperlipidemia-continue statin.  Check lipids and liver.  3 hypertension-blood pressure controlled.  Continue present medical regimen.  4 history of orthostasis-we discussed the importance of maintaining oral hydration.  5 history of palpitations-patient is having recurrent palpitations.  Will place 2-week Zio patch.  Alexandria Angel, MD

## 2023-08-26 ENCOUNTER — Telehealth: Payer: Self-pay

## 2023-08-26 NOTE — Telephone Encounter (Signed)
 Patient calls nurse line requesting Ozempic  samples.   She reports she usually gets this medication "in bulk" from our office.   It appears she was enrolled in medication assistance, however this expired 02/2023.  Advised we would need to start the renewal process over.   Will forward to pharmacy team for assistance and possible samples in the meantime.   She reports she takes 1mg  every Friday.

## 2023-08-26 NOTE — Telephone Encounter (Signed)
 Patient contacted for follow-up of need for medication assistance with Ozempic .    Since last contact patient reports she has only two doses remaining.  She also reports she has NOT had completed any medication assistance application for this calendar year with the Ozempic  manufacturer.   There is NO current availability of any Ozempic  sample at the [facility].  Patient was informed that I would ask for application assistance from [facility], CPhT to help.  Patient is aware Aron Lard is out of the office until Monday 6/2.  I am NOT sure if this patient has attempted LIS application at this time.     I also asked her to contact our office again if her supply runs out to determine if we could help with samples at that time (2 weeks from now).    Total time with patient call and documentation of interaction: 13 minutes.

## 2023-08-27 ENCOUNTER — Telehealth: Payer: Self-pay | Admitting: Student

## 2023-08-27 ENCOUNTER — Telehealth: Payer: Self-pay | Admitting: Cardiology

## 2023-08-27 NOTE — Telephone Encounter (Signed)
 Called patient back about message. Patient complaining of palpitations that woke her up in the middle of the night, last night. Patient reported her BP at that time being low 96/53 and 88/51, HR 65. Patient stated she took a nitroglycerin  to help,a and her symptoms were better. Patient had recent Heart Cath (08/16/23), that seemed okay. Made patient take her BP and HR now, 120/76 and 64. Patient stated last night she felt lightheaded, slight chest pain, and had a hot flash. Asked patient if she had enough fluids through out her day yesterday. Patient stated she did not drink enough yesterday. Encouraged patient to drink some fluids and have a small salty snack, if her BP gets that low again. Informed patient that she could have been dehydrated. Patient also stated she took her medications later than usual and that might have caused her issue. Will forward to Dr. Audery Blazing for advisement.

## 2023-08-27 NOTE — Addendum Note (Signed)
 Addended by: Wilhemena Harbour T on: 08/27/2023 03:26 PM   Modules accepted: Orders

## 2023-08-27 NOTE — Telephone Encounter (Signed)
 Patient c/o Palpitations:  STAT if patient reporting lightheadedness, shortness of breath, or chest pain  How long have you had palpitations/irregular HR/ Afib? Are you having the symptoms now? palps  Are you currently experiencing lightheadedness, SOB or CP? Nothing currently  Do you have a history of afib (atrial fibrillation) or irregular heart rhythm? No  Have you checked your BP or HR? (document readings if available): 12am- 96/53 hr 65, 88/51 hr 65; has not checked it this morning  Are you experiencing any other symptoms? Pt took nitroglycerin  last night with relief. On/off-Lightheadedness, hot flash, slight cp

## 2023-08-27 NOTE — Telephone Encounter (Signed)
 Called to discuss medication change with patient.  Patient no longer needing to take ferrous sulfate  as hemoglobin is no longer anemic.  Told patient she can stop ferrous sulfate  however I would like her to continue her other medications.  Patient was in agreement.  Patient notes her blood pressure got very soft yesterday around 90s over 50s, after taking some of her blood pressure medicines, but she is not sure what medicine she took.  Patient called the nurse line for her heart doctor, who told her to watch her blood pressure, and retake it today.  Patient retook blood pressure today and noted was 120 over something.  Patient feeling well today and patient has taking her blood pressure medicine without issue.  Discussed that if patient has another issue would like this to make an appointment to be seen.  Also discussed if patient has issue with blood pressure being too low she should go to the emergency department to receive IV fluids.  Patient in agreement understanding.

## 2023-08-27 NOTE — Telephone Encounter (Signed)
 Reviewed and agree with Dr Macky Lower plan.

## 2023-08-30 ENCOUNTER — Other Ambulatory Visit (HOSPITAL_COMMUNITY): Payer: Self-pay

## 2023-08-30 ENCOUNTER — Telehealth: Payer: Self-pay

## 2023-08-30 NOTE — Progress Notes (Signed)
 Pharmacy Medication Assistance Program Note    09/07/2023  Patient ID: [name] [name], female   DOB: 1950-12-08, 73 y.o.   MRN: [id number]     08/30/2023  Outreach Medication One  Manufacturer Medication One Novo Nordisk  Nordisk Drugs Ozempic   Dose of Ozempic  1MG   Type of Radiographer, therapeutic Assistance  Date Application Sent to Prescriber 08/31/2023  Name of Prescriber Ena Harries McDiarmid  Date Application Received From Provider 09/07/2023  Date Application Submitted to Manufacturer 08/30/2023  Method Application Sent to Manufacturer Online     Renewal submitted online (patients portion).   Provider pages faxed 09/07/23.

## 2023-09-03 ENCOUNTER — Encounter: Payer: Self-pay | Admitting: Cardiology

## 2023-09-03 ENCOUNTER — Ambulatory Visit: Attending: Cardiology | Admitting: Cardiology

## 2023-09-03 VITALS — BP 108/54 | HR 68 | Ht 62.0 in | Wt 157.8 lb

## 2023-09-03 DIAGNOSIS — I10 Essential (primary) hypertension: Secondary | ICD-10-CM | POA: Diagnosis not present

## 2023-09-03 DIAGNOSIS — E7800 Pure hypercholesterolemia, unspecified: Secondary | ICD-10-CM

## 2023-09-03 DIAGNOSIS — R002 Palpitations: Secondary | ICD-10-CM

## 2023-09-03 DIAGNOSIS — I25118 Atherosclerotic heart disease of native coronary artery with other forms of angina pectoris: Secondary | ICD-10-CM | POA: Diagnosis not present

## 2023-09-03 NOTE — Patient Instructions (Signed)
 Lab Work:  Your physician recommends that you return for lab work FASTING  If you have labs (blood work) drawn today and your tests are completely normal, you will receive your results only by: MyChart Message (if you have MyChart) OR A paper copy in the mail If you have any lab test that is abnormal or we need to change your treatment, we will call you to review the results.  Testing/Procedures: [name] [name]- Long Term Monitor Instructions  Your physician has requested you wear a ZIO patch monitor for 14 days.  This is a single patch monitor. [employer] supplies one patch monitor per enrollment. Additional stickers are not available. Please do not apply patch if you will be having a Nuclear Stress Test,  Echocardiogram, Cardiac CT, MRI, or Chest Xray during the period you would be wearing the  monitor. The patch cannot be worn during these tests. You cannot remove and re-apply the  ZIO XT patch monitor.  Your ZIO patch monitor will be mailed 3 day USPS to your address on file. It may take 3-5 days  to receive your monitor after you have been enrolled.  Once you have received your monitor, please review the enclosed instructions. Your monitor  has already been registered assigning a specific monitor serial # to you.  Billing and Patient Assistance Program Information  We have supplied [employer] with any of your insurance information on file for billing purposes. [employer] offers a sliding scale Patient Assistance Program for patients that do not have  insurance, or whose insurance does not completely cover the cost of the ZIO monitor.  You must apply for the Patient Assistance Program to qualify for this discounted rate.  To apply, please call [employer] at [phone], select option 4, select option 2, ask to apply for  Patient Assistance Program. Sanna Crystal will ask your household income, and how many people  are in your household. They will quote your out-of-pocket cost based on that information.   [employer] will also be able to set up a 66-month, interest-free payment plan if needed.  Applying the monitor   Shave hair from upper left chest.  Hold abrader disc by orange tab. Rub abrader in 40 strokes over the upper left chest as  indicated in your monitor instructions.  Clean area with 4 enclosed alcohol pads. Let dry.  Apply patch as indicated in monitor instructions. Patch will be placed under collarbone on left  side of chest with arrow pointing upward.  Rub patch adhesive wings for 2 minutes. Remove white label marked "1". Remove the white  label marked "2". Rub patch adhesive wings for 2 additional minutes.  While looking in a mirror, press and release button in center of patch. A small green light will  flash 3-4 times. This will be your only indicator that the monitor has been turned on.  Do not shower for the first 24 hours. You may shower after the first 24 hours.  Press the button if you feel a symptom. You will hear a small click. Record Date, Time and  Symptom in the Patient Logbook.  When you are ready to remove the patch, follow instructions on the last 2 pages of Patient  Logbook. Stick patch monitor onto the last page of Patient Logbook.  Place Patient Logbook in the blue and white box. Use locking tab on box and tape box closed  securely. The blue and white box has prepaid postage on it. Please place it in the mailbox as  soon  as possible. Your physician should have your test results approximately 7 days after the  monitor has been mailed back to [facility].  Call [facility] Customer Care at [phone] if you have questions regarding  your ZIO XT patch monitor. Call them immediately if you see an orange light blinking on your  monitor.  If your monitor falls off in less than 4 days, contact our Monitor department at [phone].  If your monitor becomes loose or falls off after 4 days call [employer] at [phone] for  suggestions on securing your  monitor     Follow-Up: At [facility], you and your health needs are our priority.  As part of our continuing mission to provide you with exceptional heart care, our providers are all part of one team.  This team includes your primary Cardiologist (physician) and Advanced Practice Providers or APPs (Physician Assistants and Nurse Practitioners) who all work together to provide you with the care you need, when you need it.  Your next appointment:   3 month(s)  Provider:   ANY APP    Your physician wants you to follow-up in: 8-9 MONTHS WITH DR Audery Blazing You will receive a reminder letter in the mail two months in advance. If you don't receive a letter, please call our office to schedule the follow-up appointment.

## 2023-09-06 ENCOUNTER — Ambulatory Visit: Attending: Cardiology

## 2023-09-06 DIAGNOSIS — R002 Palpitations: Secondary | ICD-10-CM

## 2023-09-06 NOTE — Progress Notes (Unsigned)
 Enrolled for [employer] to mail a ZIO XT long term holter monitor to the patients address on file.

## 2023-09-07 ENCOUNTER — Encounter: Payer: Self-pay | Admitting: *Deleted

## 2023-09-23 NOTE — Telephone Encounter (Signed)
 Faxed requested part D card to novo nordisk.

## 2023-09-28 NOTE — Progress Notes (Signed)
 Pharmacy Medication Assistance Program Note    09/28/2023  Patient ID: [name] [name], female   DOB: [birth year], 73 y.o.   MRN: [id number]     08/30/2023  Outreach Medication One  Manufacturer Medication One Novo Nordisk  Nordisk Drugs Ozempic   Dose of Ozempic  1MG   Type of Radiographer, therapeutic Assistance  Date Application Sent to Prescriber 08/31/2023  Name of Prescriber Krystal McDiarmid  Date Application Received From Provider 09/07/2023  Date Application Submitted to Manufacturer 08/30/2023  Method Application Sent to Manufacturer Online  Patient Assistance Determination Approved  Approval Start Date 09/27/2023  Approval End Date 03/29/2024     RENEWAL APPROVED

## 2023-09-30 ENCOUNTER — Other Ambulatory Visit: Payer: Self-pay

## 2023-10-01 MED ORDER — SEMAGLUTIDE (1 MG/DOSE) 4 MG/3ML ~~LOC~~ SOPN: 1.0000 mg | PEN_INJECTOR | SUBCUTANEOUS | 2 refills | Status: AC

## 2023-10-04 ENCOUNTER — Other Ambulatory Visit (HOSPITAL_COMMUNITY): Payer: Self-pay

## 2023-10-04 NOTE — Telephone Encounter (Signed)
 Prior auth rec'd for patients Ozempic  medication.   Patient receives medication through Novo Nordisk patient assistance company. Medication in process of shipping to the office as of 09/27/23. Should arrive in 10-14 business days.

## 2023-10-05 NOTE — Telephone Encounter (Signed)
 Patient returns call to nurse line.   Informed of update. Patient appreciative.   Chiquita JAYSON English, RN

## 2023-10-06 ENCOUNTER — Ambulatory Visit: Payer: Self-pay | Admitting: Cardiology

## 2023-10-06 DIAGNOSIS — R002 Palpitations: Secondary | ICD-10-CM | POA: Diagnosis not present

## 2023-10-06 DIAGNOSIS — E7800 Pure hypercholesterolemia, unspecified: Secondary | ICD-10-CM

## 2023-10-07 MED ORDER — METOPROLOL SUCCINATE ER 50 MG PO TB24: 75.0000 mg | ORAL_TABLET | Freq: Every day | ORAL | 3 refills | Status: DC

## 2023-10-18 NOTE — Telephone Encounter (Signed)
 Patient calls nurse line requesting update on Ozempic  shipment. Please advise.   [name] JAYSON English, RN

## 2023-10-19 NOTE — Telephone Encounter (Signed)
 Patient returns call to nurse line requesting update on Ozempic  shipment.   Chiquita JAYSON English, RN

## 2023-10-20 NOTE — Telephone Encounter (Signed)
 Reached out to patient regarding enrollment and shipment. Informed patient that she will receive a phone call from the office once the med arrives. Also provided companies direct line for patient to speak with a rep regarding exact shipment details.

## 2023-10-26 ENCOUNTER — Telehealth: Payer: Self-pay

## 2023-10-26 NOTE — Telephone Encounter (Signed)
 Patient presents to [facility] to pick up shipment.

## 2023-10-26 NOTE — Telephone Encounter (Signed)
 Informed patient her novo nordisk shipment is ready for pickup  Ozempic  1mg  dose pens (4 boxes)

## 2023-10-28 ENCOUNTER — Other Ambulatory Visit: Payer: Self-pay | Admitting: Family Medicine

## 2023-10-29 NOTE — Telephone Encounter (Signed)
 Received refill for BID pantoprazole . Cannot find indication for BID dosing for this duration in charge.  Front team- please schedule with new PCP for medication. Ask her to bring all of her medications with her  Thanks! Suzann Daring, MD  Family Medicine Teaching Service

## 2023-11-05 ENCOUNTER — Ambulatory Visit

## 2023-11-05 VITALS — BP 136/68 | HR 74 | Ht 62.0 in | Wt 167.8 lb

## 2023-11-05 DIAGNOSIS — M545 Low back pain, unspecified: Secondary | ICD-10-CM

## 2023-11-05 DIAGNOSIS — G4733 Obstructive sleep apnea (adult) (pediatric): Secondary | ICD-10-CM | POA: Diagnosis not present

## 2023-11-05 DIAGNOSIS — G479 Sleep disorder, unspecified: Secondary | ICD-10-CM | POA: Diagnosis not present

## 2023-11-05 DIAGNOSIS — R252 Cramp and spasm: Secondary | ICD-10-CM

## 2023-11-05 DIAGNOSIS — Z720 Tobacco use: Secondary | ICD-10-CM | POA: Diagnosis not present

## 2023-11-05 MED ORDER — MELATONIN 3 MG PO TABS: 3.0000 mg | ORAL_TABLET | Freq: Every day | ORAL | 11 refills | Status: AC

## 2023-11-05 MED ORDER — TIZANIDINE HCL 2 MG PO CAPS
2.0000 mg | ORAL_CAPSULE | Freq: Three times a day (TID) | ORAL | 0 refills | Status: DC | PRN
Start: 2023-11-05 — End: 2024-01-20

## 2023-11-05 MED ORDER — DICLOFENAC SODIUM 1 % EX GEL
2.0000 g | Freq: Four times a day (QID) | CUTANEOUS | 1 refills | Status: DC | PRN
Start: 2023-11-05 — End: 2024-01-20

## 2023-11-05 NOTE — Progress Notes (Signed)
    SUBJECTIVE:   CHIEF COMPLAINT / HPI: Insomnia and back pain  [name] [name] present at the [facility] clinic today to follow up with her insomnia management and new onset of back pain   Insomnia Patient state has been having a very hard time fail as sleep and stay sleep. She has been taking Hydroxyzine  TID PRN to help as her sleep Aid with no improve  Back pain [name] [name] states her back pain started yesterday after exercise. Denies B/B incontinence, decrese strength or numbness of lower extremities  PERTINENT  PMH / PSH:  HTN Mitral Valve Disorder Obesity DM Difficulty sleeping  Smoker Stress incontinence     OBJECTIVE:   BP 136/68   Pulse 74   Ht 5' 2 (1.575 m)   Wt 167 lb 12.8 oz (76.1 kg)   SpO2 99%   BMI 30.69 kg/m   Physical Exam Constitutional:      Appearance: Normal appearance. She is obese.  Cardiovascular:     Rate and Rhythm: Normal rate.     Pulses: Normal pulses.  Pulmonary:     Effort: Pulmonary effort is normal.     Breath sounds: Normal breath sounds.  Abdominal:     Palpations: Abdomen is soft.  Musculoskeletal:        General: Tenderness (bilateral lower back in lumbar area. No midline tenderness) present.  Skin:    Capillary Refill: Capillary refill takes less than 2 seconds.  Neurological:     General: No focal deficit present.     Mental Status: She is alert and oriented to person, place, and time.     Cranial Nerves: No cranial nerve deficit.  Psychiatric:        Mood and Affect: Mood normal.        Behavior: Behavior normal.      ASSESSMENT/PLAN:   [name] [name] present at the [facility] clinic today to follow up with her insomnia management and new onset of back pain. In addition patient also states that she would like to lose weight and quite smoking.  Assessment & Plan Sleep difficulties Stop taking Hydroxyzine  Please take Melatonin 3 mg nightly if not improve call the clinic for Trazodone  order Sleep study order placed to rule out  OSA Acute bilateral low back pain without sciatica - Diclofenac  topical - apply to effect area  for back pain - Tizenadine 2-4 mg 3 time daily as needed for back pain Smoking trying to quit - Ordered Nicotin patch  - F/u with Dr. Koval for smoking cessation on 11/15/23   Houston Samuels, DO [facility], [facility]

## 2023-11-05 NOTE — Assessment & Plan Note (Signed)
-   Diclofenac  topical - apply to effect area  for back pain - Tizenadine 2-4 mg 3 time daily as needed for back pain

## 2023-11-05 NOTE — Progress Notes (Signed)
 [name] [name] is alone Sources of clinical information for visit is/are patient. Nursing assessment for this office visit was reviewed with the patient for accuracy and revision.   Previous Report(s) Reviewed: none     11/05/2023    3:14 PM  Depression screen PHQ 2/9  Decreased Interest 1  Down, Depressed, Hopeless 1  PHQ - 2 Score 2  Altered sleeping 3  Tired, decreased energy 2  Change in appetite 2  Feeling bad or failure about yourself  0  Trouble concentrating 1  Moving slowly or fidgety/restless 1  Suicidal thoughts 0  PHQ-9 Score 11   Flowsheet Row Office Visit from 11/05/2023 in [facility] - A Dept Of [place]. [facility] Office Visit from 08/17/2023 in [facility] - A Dept Of [place]. [facility] Office Visit from 06/07/2023 in [facility] - A Dept Of [place]. [facility]  Thoughts that you would be better off dead, or of hurting yourself in some way Not at all Not at all Not at all  PHQ-9 Total Score 11 7 19        08/17/2023    2:21 PM 06/07/2023    2:42 PM 01/04/2023    1:46 PM 07/24/2022    2:48 PM 07/17/2022   11:05 AM  Fall Risk   Falls in the past year? 0 0 0 0 0  Number falls in past yr: 0 0 0    Injury with Fall? 0 0 0    Risk for fall due to :  No Fall Risks     Follow up  Falls evaluation completed          11/05/2023    3:14 PM 08/17/2023    2:21 PM 06/07/2023    2:42 PM  PHQ9 SCORE ONLY  PHQ-9 Total Score 11 7 19     There are no preventive care reminders to display for this patient.  Health Maintenance Due  Topic Date Due   Medicare Annual Wellness (AWV)  04/20/2020   FOOT EXAM  05/14/2021   Diabetic kidney evaluation - Urine ACR  07/10/2022   OPHTHALMOLOGY EXAM  04/28/2023   HEMOGLOBIN A1C  07/05/2023   Colonoscopy  07/10/2023   MAMMOGRAM  07/31/2023   COVID-19 Vaccine (6 - Pfizer risk 2024-25 season) 10/06/2023   INFLUENZA VACCINE  10/29/2023      History/P.E.  limitations: none  There are no preventive care reminders to display for this patient.  Diabetes Health Maintenance Due  Topic Date Due   FOOT EXAM  05/14/2021   OPHTHALMOLOGY EXAM  04/28/2023   HEMOGLOBIN A1C  07/05/2023    Health Maintenance Due  Topic Date Due   Medicare Annual Wellness (AWV)  04/20/2020   FOOT EXAM  05/14/2021   Diabetic kidney evaluation - Urine ACR  07/10/2022   OPHTHALMOLOGY EXAM  04/28/2023   HEMOGLOBIN A1C  07/05/2023   Colonoscopy  07/10/2023   MAMMOGRAM  07/31/2023   COVID-19 Vaccine (6 - Pfizer risk 2024-25 season) 10/06/2023   INFLUENZA VACCINE  10/29/2023     Chief Complaint  Patient presents with   Follow-up   Back Pain   Insomnia     Discussed the use of AI scribe software for clinical note transcription with the patient, who gave verbal consent to proceed.  History of Present Illness      SDOH Screenings   Food Insecurity: No Food Insecurity (04/21/2019)  Housing: Low  Risk  (04/21/2019)  Transportation Needs: No Transportation Needs (04/21/2019)  Alcohol Screen: Low Risk  (04/21/2019)  Depression (PHQ2-9): High Risk (11/05/2023)  Financial Resource Strain: Low Risk  (04/21/2019)  Physical Activity: Sufficiently Active (04/21/2019)  Social Connections: Socially Integrated (04/21/2019)  Stress: No Stress Concern Present (04/21/2019)  Tobacco Use: Medium Risk (11/05/2023)   --------------------------------------------------------------------------------------------------------------------------------------------- Visit Problem List with Assessment and Plan   Assessment and Plan Assessment & Plan      No problem-specific Assessment & Plan notes found for this encounter.

## 2023-11-05 NOTE — Patient Instructions (Addendum)
   It was great to see you!  Our plans for today:  - Please take Melatonin 3 mg nightly if not improve call the clinic for Trazodone  order - Stop taking Hydroxyzine  - Placed Sleep study order - Diclofenac  topical - apply to effect area  for back pain - Tizenadine 2-4 mg 3 time daily as needed for back pain  Take care and seek immediate care sooner if you develop any concerns.       Houston Coralee HAS PGY 1 Family Medicine Resident [facility]  [address] [place], [place] [address] Fax [phone] Phone [phone] 11/05/2023, 3:57 PM

## 2023-11-11 ENCOUNTER — Other Ambulatory Visit: Payer: Self-pay | Admitting: Cardiology

## 2023-11-26 ENCOUNTER — Telehealth: Payer: Self-pay

## 2023-11-26 ENCOUNTER — Emergency Department (HOSPITAL_COMMUNITY)
Admission: EM | Admit: 2023-11-26 | Discharge: 2023-11-27 | Disposition: A | Discharge status: Home / Self Care | Attending: Emergency Medicine | Admitting: Emergency Medicine

## 2023-11-26 ENCOUNTER — Other Ambulatory Visit: Payer: Self-pay

## 2023-11-26 ENCOUNTER — Encounter (HOSPITAL_COMMUNITY): Payer: Self-pay | Admitting: Emergency Medicine

## 2023-11-26 ENCOUNTER — Emergency Department (HOSPITAL_COMMUNITY)

## 2023-11-26 DIAGNOSIS — Z7982 Long term (current) use of aspirin: Secondary | ICD-10-CM | POA: Insufficient documentation

## 2023-11-26 DIAGNOSIS — U071 COVID-19: Secondary | ICD-10-CM | POA: Diagnosis not present

## 2023-11-26 DIAGNOSIS — R519 Headache, unspecified: Secondary | ICD-10-CM

## 2023-11-26 LAB — BASIC METABOLIC PANEL WITH GFR
Anion gap: 15 (ref 5–15)
BUN: 11 mg/dL (ref 8–23)
CO2: 19 mmol/L — ABNORMAL LOW (ref 22–32)
Calcium: 9.5 mg/dL (ref 8.9–10.3)
Chloride: 103 mmol/L (ref 98–111)
Creatinine, Ser: 0.92 mg/dL (ref 0.44–1.00)
GFR, Estimated: >60 mL/min (ref >60)
Glucose, Bld: 104 mg/dL — ABNORMAL HIGH (ref 70–99)
Potassium: 4 mmol/L (ref 3.5–5.1)
Sodium: 138 mmol/L (ref 135–145)

## 2023-11-26 LAB — CBC WITH DIFFERENTIAL/PLATELET
Abs Immature Granulocytes: 0.02 K/uL (ref 0.00–0.07)
Basophils Absolute: 0 K/uL (ref 0.0–0.1)
Basophils Relative: 1 %
Eosinophils Absolute: 0 K/uL (ref 0.0–0.5)
Eosinophils Relative: 1 %
HCT: 41.6 % (ref 36.0–46.0)
Hemoglobin: 12.9 g/dL (ref 12.0–15.0)
Immature Granulocytes: 0 %
Lymphocytes Relative: 20 %
Lymphs Abs: 1.1 K/uL (ref 0.7–4.0)
MCH: 22.5 pg — ABNORMAL LOW (ref 26.0–34.0)
MCHC: 31 g/dL (ref 30.0–36.0)
MCV: 72.6 fL — ABNORMAL LOW (ref 80.0–100.0)
Monocytes Absolute: 0.4 K/uL (ref 0.1–1.0)
Monocytes Relative: 7 %
Neutro Abs: 3.9 K/uL (ref 1.7–7.7)
Neutrophils Relative %: 71 %
Platelets: 385 K/uL (ref 150–400)
RBC: 5.73 MIL/uL — ABNORMAL HIGH (ref 3.87–5.11)
RDW: 15.9 % — ABNORMAL HIGH (ref 11.5–15.5)
WBC: 5.5 K/uL (ref 4.0–10.5)
nRBC: 0 % (ref 0.0–0.2)

## 2023-11-26 LAB — RESP PANEL BY RT-PCR (RSV, FLU A&B, COVID)  RVPGX2
Influenza A by PCR: NEGATIVE
Influenza B by PCR: NEGATIVE
Resp Syncytial Virus by PCR: NEGATIVE
SARS Coronavirus 2 by RT PCR: POSITIVE — AB

## 2023-11-26 MED ORDER — IOHEXOL 350 MG/ML SOLN
75.0000 mL | Freq: Once | INTRAVENOUS | Status: AC | PRN
  Administered 2023-11-26: 75 mL via INTRAVENOUS

## 2023-11-26 MED ORDER — HYDRALAZINE HCL 20 MG/ML IJ SOLN
5.0000 mg | Freq: Once | INTRAMUSCULAR | Status: AC
  Administered 2023-11-27: 5 mg via INTRAVENOUS
  Filled 2023-11-26: qty 1

## 2023-11-26 MED ORDER — MORPHINE SULFATE (PF) 4 MG/ML IV SOLN
4.0000 mg | Freq: Once | INTRAVENOUS | Status: AC
  Administered 2023-11-26: 4 mg via INTRAVENOUS
  Filled 2023-11-26: qty 1

## 2023-11-26 MED ORDER — ONDANSETRON HCL 4 MG/2ML IJ SOLN
4.0000 mg | Freq: Once | INTRAMUSCULAR | Status: AC
  Administered 2023-11-26: 4 mg via INTRAVENOUS
  Filled 2023-11-26: qty 2

## 2023-11-26 MED ORDER — SODIUM CHLORIDE 0.9 % IV BOLUS
500.0000 mL | Freq: Once | INTRAVENOUS | Status: AC
  Administered 2023-11-26: 500 mL via INTRAVENOUS

## 2023-11-26 MED ORDER — KETOROLAC TROMETHAMINE 15 MG/ML IJ SOLN
15.0000 mg | Freq: Once | INTRAMUSCULAR | Status: AC
  Administered 2023-11-27: 15 mg via INTRAVENOUS
  Filled 2023-11-26: qty 1

## 2023-11-26 NOTE — Discharge Instructions (Addendum)
 You have been seen and discharged from the emergency department.  You are found to be positive for COVID-19, this is most likely the source of your symptoms.  You have been prescribed nausea medicine to take as needed.  Take Tylenol /ibuprofen  and over-the-counter medications for symptom control.  Stay well-hydrated.  Your head imaging was normal.  But there was an incidental finding on your head imaging involving a possible aneurysm.  Please follow-up with neurology and neurology IR for continued monitoring of this.  Follow-up with your primary provider for further evaluation and further care. Take home medications as prescribed. If you have any worsening symptoms or further concerns for your health please return to an emergency department for further evaluation.

## 2023-11-26 NOTE — ED Provider Notes (Incomplete)
 [name] EMERGENCY DEPARTMENT AT [facility] Provider Note   CSN: [id number] Arrival date & time: 11/26/23  1202     Patient presents with: Headache and Dizziness   [name] [name] is a 73 y.o. female.  {Add pertinent medical, surgical, social history, OB history to HPI:3294} HPI   73 year old female presents emergency department with headache.  Patient states yesterday morning when she woke up her whole body felt sore, primarily from her left shoulder down to her lower hips.  Following this she developed a gradual headache at the top of her head.  She had some mild associated neck pain but this resolved.  The headaches wax and wane in severity but never resolved.  Specifically was present overnight, preventing her from getting restful sleep.  Feels like the head is slightly worse when laying flat.  Denies any acute neurosymptoms including vision loss, facial droop, speech changes, focal numbness or weakness.  She at times feels lightheaded but no significant dizziness.  No noted fever.  Denies significant history of migraine headaches.  Prior to Admission medications   Medication Sig Start Date End Date Taking? Authorizing Provider  amLODipine  (NORVASC ) 5 MG tablet TAKE 1 TABLET (5 MG TOTAL) BY MOUTH DAILY. 11/12/23 11/11/24  Ladona Heinz, MD  ammonium lactate  (LAC-HYDRIN ) 12 % lotion APPLY 1 APPLICATION TOPICALLY AS NEEDED FOR DRY SKIN. 04/17/22   Gershon Donnice SAUNDERS, DPM  aspirin  81 MG tablet Take 81 mg by mouth daily.    [provider]  atorvastatin  (LIPITOR) 80 MG tablet Take 1 tablet (80 mg total) by mouth daily. 08/17/23   Sowell, Brandon, MD  cycloSPORINE (RESTASIS) 0.05 % ophthalmic emulsion 1 drop 2 (two) times daily. Uses PRN    [provider]  diclofenac  Sodium (VOLTAREN ) 1 % GEL Apply 2 g topically 4 (four) times daily as needed (pain). 11/05/23   Suknaim, Houston B, DO  ezetimibe  (ZETIA ) 10 MG tablet Take 1 tablet (10 mg total) by mouth daily.  08/17/23   Jennelle Riis, MD  fluticasone  (FLONASE ) 50 MCG/ACT nasal spray Place 2 sprays into both nostrils daily. 12/23/21   Jennelle Riis, MD  gabapentin  (NEURONTIN ) 100 MG capsule TAKE 1 CAPSULE BY MOUTH EVERY MORNING AND 1 CAPSULE DAILY WITH LUNCH AND 2 CAPSULES AT BEDTIME. 08/19/23   Jennelle Riis, MD  hydrOXYzine  (ATARAX ) 10 MG tablet Take 1 tablet (10 mg total) by mouth 3 (three) times daily as needed. 06/24/23   Sowell, Brandon, MD  melatonin 3 MG TABS tablet Take 1 tablet (3 mg total) by mouth at bedtime. 11/05/23   Suknaim, Kulkaew B, DO  metoprolol  succinate (TOPROL  XL) 50 MG 24 hr tablet Take 1.5 tablets (75 mg total) by mouth daily. 10/07/23   Pietro Redell RAMAN, MD  nitroGLYCERIN  (NITROSTAT ) 0.4 MG SL tablet Place 1 tablet (0.4 mg total) under the tongue every 5 (five) minutes as needed for chest pain. DISSOLVE 1 TABLET UNDER THE TONGUE EVERY 5 MINUTES AS NEEDED FOR CHEST PAIN. DO NOT EXCEED A TOTAL OF 3 DOSES IN 15 MINUTES. (ORIG) 08/06/23   Pietro Redell RAMAN, MD  pantoprazole  (PROTONIX ) 40 MG tablet TAKE 1 TABLET BY MOUTH TWICE A DAY 10/29/23   Delores Suzann HERO, MD  Polyethyl Glycol-Propyl Glycol (SYSTANE) 0.4-0.3 % GEL ophthalmic gel Place 1 Application into both eyes.    [provider]  polyethylene glycol powder (GLYCOLAX /MIRALAX ) 17 GM/SCOOP powder Take 17 g by mouth 2 (two) times daily as needed. 07/09/21   Simmons-Robinson, Rockie, MD  PREMARIN   vaginal cream PLACE 1 APPLICATORFUL VAGINALLY DAILY. 07/23/23   Sowell, Brandon, MD  Semaglutide , 1 MG/DOSE, 4 MG/3ML SOPN Inject 1 mg into the skin once a week. 10/01/23   Sowell, Brandon, MD  sertraline  (ZOLOFT ) 100 MG tablet Take 1 tablet (100 mg total) by mouth daily. 06/07/23   Sowell, Brandon, MD  sertraline  (ZOLOFT ) 50 MG tablet Take 1 tablet (50 mg total) by mouth daily. 06/07/23 06/06/24  Jennelle Riis, MD  terbinafine  (LAMISIL ) 250 MG tablet Take 1 tablet (250 mg total) by mouth daily. 07/29/22   Gershon Donnice SAUNDERS, DPM  tizanidine   (ZANAFLEX ) 2 MG capsule Take 1-2 capsules (2-4 mg total) by mouth 3 (three) times daily as needed for muscle spasms. 11/05/23   Suknaim, Houston B, DO  valsartan  (DIOVAN ) 40 MG tablet Take 1 tablet (40 mg total) by mouth daily. 01/31/23   Sowell, Brandon, MD    Allergies: Patient has no known allergies.    Review of Systems  Constitutional:  Negative for fever.  Respiratory:  Negative for shortness of breath.   Cardiovascular:  Negative for chest pain.  Gastrointestinal:  Negative for abdominal pain, diarrhea and vomiting.  Musculoskeletal:  Positive for neck pain.  Skin:  Negative for rash.  Neurological:  Positive for light-headedness and headaches. Negative for dizziness, tremors, seizures, syncope, facial asymmetry, speech difficulty and weakness.    Updated Vital Signs BP (!) 179/85 (BP Location: Right Arm)   Pulse 72   Temp 98.3 F (36.8 C) (Oral)   Resp 16   SpO2 98%   Physical Exam Vitals and nursing note reviewed.  Constitutional:      General: She is not in acute distress.    Appearance: Normal appearance.  HENT:     Head: Normocephalic.     Mouth/Throat:     Mouth: Mucous membranes are moist.  Eyes:     General: No visual field deficit.    Extraocular Movements: Extraocular movements intact.     Pupils: Pupils are equal, round, and reactive to light.  Cardiovascular:     Rate and Rhythm: Normal rate.  Pulmonary:     Effort: Pulmonary effort is normal. No respiratory distress.  Abdominal:     Palpations: Abdomen is soft.     Tenderness: There is no abdominal tenderness.  Musculoskeletal:     Cervical back: Neck supple. No rigidity.  Skin:    General: Skin is warm.  Neurological:     Mental Status: She is alert and oriented to person, place, and time. Mental status is at baseline.     Cranial Nerves: No cranial nerve deficit, dysarthria or facial asymmetry.  Psychiatric:        Mood and Affect: Mood normal.     (all labs ordered are listed, but only  abnormal results are displayed) Labs Reviewed  CBC WITH DIFFERENTIAL/PLATELET - Abnormal; Notable for the following components:      Result Value   RBC 5.73 (*)    MCV 72.6 (*)    MCH 22.5 (*)    RDW 15.9 (*)    All other components within normal limits  BASIC METABOLIC PANEL WITH GFR - Abnormal; Notable for the following components:   CO2 19 (*)    Glucose, Bld 104 (*)    All other components within normal limits  RESP PANEL BY RT-PCR (RSV, FLU A&B, COVID)  RVPGX2    EKG: None  Radiology: No results found.  {Document cardiac monitor, telemetry assessment procedure when appropriate:32947} Procedures   Medications  Ordered in the ED  sodium chloride  0.9 % bolus 500 mL (has no administration in time range)  morphine  (PF) 4 MG/ML injection 4 mg (has no administration in time range)  ondansetron  (ZOFRAN ) injection 4 mg (has no administration in time range)      {Click here for ABCD2, HEART and other calculators REFRESH Note before signing:1}                              Medical Decision Making Amount and/or Complexity of Data Reviewed Labs: ordered. Radiology: ordered.  Risk Prescription drug management.   ***  {Document critical care time when appropriate  Document review of labs and clinical decision tools ie CHADS2VASC2, etc  Document your independent review of radiology images and any outside records  Document your discussion with family members, caretakers and with consultants  Document social determinants of health affecting pt's care  Document your decision making why or why not admission, treatments were needed:32947:::1}   Final diagnoses:  None    ED Discharge Orders     None

## 2023-11-26 NOTE — ED Triage Notes (Signed)
 Patient report headache and dizziness started last night. Patient report taking OTC pain medication without relief. Patient report nausea denies vomiting. Patient denies taking blood thinners.

## 2023-11-26 NOTE — Telephone Encounter (Signed)
 Patient calls nurse line regarding severe headache that started yesterday afternoon. She states that she does not have history of migraine headaches.  History of HTN, CAD.   Reports that she had elevated BP of 180's/80's yesterday evening. Today BP is 124/70.  Along with severe headache, patient is reporting lightheadedness and blurry vision. Advised ED evaluation. Patient reports that she is going to have her husband drive her. Declines EMS transport.   Chiquita JAYSON English, RN

## 2023-11-26 NOTE — ED Provider Notes (Signed)
 [place] EMERGENCY DEPARTMENT AT [facility] Provider Note   CSN: [id number] Arrival date & time: 11/26/23  1202     Patient presents with: Headache and Dizziness   [name] [name] is a 73 y.o. female.   HPI   73 year old female presents emergency department with headache.  Patient states yesterday morning when she woke up her whole body felt sore, primarily from her left shoulder down to her lower hips.  Following this she developed a gradual headache at the top of her head.  She had some mild associated neck pain but this resolved.  The headaches wax and wane in severity but never resolved.  Specifically was present overnight, preventing her from getting restful sleep.  Feels like the head is slightly worse when laying flat.  Denies any acute neurosymptoms including vision loss, facial droop, speech changes, focal numbness or weakness.  She at times feels lightheaded but no significant dizziness.  No noted fever.  Denies significant history of migraine headaches.  Prior to Admission medications   Medication Sig Start Date End Date Taking? Authorizing Provider  amLODipine  (NORVASC ) 5 MG tablet TAKE 1 TABLET (5 MG TOTAL) BY MOUTH DAILY. 11/12/23 11/11/24  Ladona Heinz, MD  ammonium lactate  (LAC-HYDRIN ) 12 % lotion APPLY 1 APPLICATION TOPICALLY AS NEEDED FOR DRY SKIN. 04/17/22   Gershon Donnice SAUNDERS, DPM  aspirin  81 MG tablet Take 81 mg by mouth daily.    [provider]  atorvastatin  (LIPITOR) 80 MG tablet Take 1 tablet (80 mg total) by mouth daily. 08/17/23   Sowell, Brandon, MD  cycloSPORINE (RESTASIS) 0.05 % ophthalmic emulsion 1 drop 2 (two) times daily. Uses PRN    [provider]  diclofenac  Sodium (VOLTAREN ) 1 % GEL Apply 2 g topically 4 (four) times daily as needed (pain). 11/05/23   Suknaim, Houston B, DO  ezetimibe  (ZETIA ) 10 MG tablet Take 1 tablet (10 mg total) by mouth daily. 08/17/23   Jennelle Riis, MD  fluticasone  (FLONASE ) 50 MCG/ACT nasal spray  Place 2 sprays into both nostrils daily. 12/23/21   Jennelle Riis, MD  gabapentin  (NEURONTIN ) 100 MG capsule TAKE 1 CAPSULE BY MOUTH EVERY MORNING AND 1 CAPSULE DAILY WITH LUNCH AND 2 CAPSULES AT BEDTIME. 08/19/23   Jennelle Riis, MD  hydrOXYzine  (ATARAX ) 10 MG tablet Take 1 tablet (10 mg total) by mouth 3 (three) times daily as needed. 06/24/23   Sowell, Brandon, MD  melatonin 3 MG TABS tablet Take 1 tablet (3 mg total) by mouth at bedtime. 11/05/23   Suknaim, Kulkaew B, DO  metoprolol  succinate (TOPROL  XL) 50 MG 24 hr tablet Take 1.5 tablets (75 mg total) by mouth daily. 10/07/23   Pietro Redell RAMAN, MD  nitroGLYCERIN  (NITROSTAT ) 0.4 MG SL tablet Place 1 tablet (0.4 mg total) under the tongue every 5 (five) minutes as needed for chest pain. DISSOLVE 1 TABLET UNDER THE TONGUE EVERY 5 MINUTES AS NEEDED FOR CHEST PAIN. DO NOT EXCEED A TOTAL OF 3 DOSES IN 15 MINUTES. (ORIG) 08/06/23   Pietro Redell RAMAN, MD  pantoprazole  (PROTONIX ) 40 MG tablet TAKE 1 TABLET BY MOUTH TWICE A DAY 10/29/23   Delores Suzann HERO, MD  Polyethyl Glycol-Propyl Glycol (SYSTANE) 0.4-0.3 % GEL ophthalmic gel Place 1 Application into both eyes.    [provider]  polyethylene glycol powder (GLYCOLAX /MIRALAX ) 17 GM/SCOOP powder Take 17 g by mouth 2 (two) times daily as needed. 07/09/21   Simmons-Robinson, Rockie, MD  PREMARIN  vaginal cream PLACE 1 APPLICATORFUL VAGINALLY DAILY. 07/23/23  Jennelle Riis, MD  Semaglutide , 1 MG/DOSE, 4 MG/3ML SOPN Inject 1 mg into the skin once a week. 10/01/23   Sowell, Brandon, MD  sertraline  (ZOLOFT ) 100 MG tablet Take 1 tablet (100 mg total) by mouth daily. 06/07/23   Sowell, Brandon, MD  sertraline  (ZOLOFT ) 50 MG tablet Take 1 tablet (50 mg total) by mouth daily. 06/07/23 06/06/24  Jennelle Riis, MD  terbinafine  (LAMISIL ) 250 MG tablet Take 1 tablet (250 mg total) by mouth daily. 07/29/22   Gershon Donnice SAUNDERS, DPM  tizanidine  (ZANAFLEX ) 2 MG capsule Take 1-2 capsules (2-4 mg total) by mouth 3 (three)  times daily as needed for muscle spasms. 11/05/23   Suknaim, Houston B, DO  valsartan  (DIOVAN ) 40 MG tablet Take 1 tablet (40 mg total) by mouth daily. 01/31/23   Sowell, Brandon, MD    Allergies: Patient has no known allergies.    Review of Systems  Constitutional:  Negative for fever.  Respiratory:  Negative for shortness of breath.   Cardiovascular:  Negative for chest pain.  Gastrointestinal:  Negative for abdominal pain, diarrhea and vomiting.  Musculoskeletal:  Positive for neck pain.  Skin:  Negative for rash.  Neurological:  Positive for light-headedness and headaches. Negative for dizziness, tremors, seizures, syncope, facial asymmetry, speech difficulty and weakness.    Updated Vital Signs BP (!) 179/85 (BP Location: Right Arm)   Pulse 72   Temp 98.3 F (36.8 C) (Oral)   Resp 16   SpO2 98%   Physical Exam Vitals and nursing note reviewed.  Constitutional:      General: She is not in acute distress.    Appearance: Normal appearance.  HENT:     Head: Normocephalic.     Mouth/Throat:     Mouth: Mucous membranes are moist.  Eyes:     General: No visual field deficit.    Extraocular Movements: Extraocular movements intact.     Pupils: Pupils are equal, round, and reactive to light.  Cardiovascular:     Rate and Rhythm: Normal rate.  Pulmonary:     Effort: Pulmonary effort is normal. No respiratory distress.  Abdominal:     Palpations: Abdomen is soft.     Tenderness: There is no abdominal tenderness.  Musculoskeletal:     Cervical back: Neck supple. No rigidity.  Skin:    General: Skin is warm.  Neurological:     Mental Status: She is alert and oriented to person, place, and time. Mental status is at baseline.     Cranial Nerves: No cranial nerve deficit, dysarthria or facial asymmetry.  Psychiatric:        Mood and Affect: Mood normal.     (all labs ordered are listed, but only abnormal results are displayed) Labs Reviewed  CBC WITH DIFFERENTIAL/PLATELET -  Abnormal; Notable for the following components:      Result Value   RBC 5.73 (*)    MCV 72.6 (*)    MCH 22.5 (*)    RDW 15.9 (*)    All other components within normal limits  BASIC METABOLIC PANEL WITH GFR - Abnormal; Notable for the following components:   CO2 19 (*)    Glucose, Bld 104 (*)    All other components within normal limits  RESP PANEL BY RT-PCR (RSV, FLU A&B, COVID)  RVPGX2    EKG: None  Radiology: No results found.   Procedures   Medications Ordered in the ED  sodium chloride  0.9 % bolus 500 mL (has no administration in time  range)  morphine  (PF) 4 MG/ML injection 4 mg (has no administration in time range)  ondansetron  (ZOFRAN ) injection 4 mg (has no administration in time range)                                    Medical Decision Making Amount and/or Complexity of Data Reviewed Labs: ordered. Radiology: ordered.  Risk Prescription drug management.   73 year old female presents emergency department with body pain as well as new headache.  States that the headache is worse when she lies flat, difficult to sleep last night.  Denies any associated neurosymptoms.  Did not take her hypertensive medications today because she has been here waiting to be seen.  Vitals are stable on arrival.  Blood work is reassuring without any acute abnormalities, respiratory panel is pending.  Given the positional aspect of the headache, worse at night CT imaging was pursued.  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  Otherwise a CT venogram is negative.  Consulted with neurologist, Dr. Vanessa who does not believe that this is the source of her symptoms.  He recommends outpatient follow-up with neurology and neuro IR.  We discussed the potential of starting Topamax daily however her COVID swab came back as positive and I believe that this is most likely the source of all of her symptoms.  The patient and I discussed the  idea of Paxlovid , but we will defer and plan to treat symptomatically.  Neurology outpatient follow-up has been included.  Patient otherwise feels improved and has no other acute complaints.  Patient at this time appears safe and stable for discharge and close outpatient follow up. Discharge plan and strict return to ED precautions discussed, patient verbalizes understanding and agreement.     Final diagnoses:  None    ED Discharge Orders     None          Bari Roxie HERO, DO 11/27/23 0009

## 2023-11-26 NOTE — ED Notes (Signed)
 Patient transported to CT

## 2023-11-26 NOTE — Telephone Encounter (Signed)
 Patient calls nurse line in regards to sleep medicine.   She reports she has tried Melatonin for several weeks, however it is not working.   She reports she was told to call back if no success.   Advised will forward to PCP.

## 2023-11-27 DIAGNOSIS — U071 COVID-19: Secondary | ICD-10-CM | POA: Diagnosis not present

## 2023-11-27 MED ORDER — ONDANSETRON HCL 4 MG PO TABS: 4.0000 mg | ORAL_TABLET | Freq: Three times a day (TID) | ORAL | 0 refills | Status: AC | PRN

## 2023-11-27 MED ORDER — ONDANSETRON HCL 4 MG PO TABS: 4.0000 mg | ORAL_TABLET | Freq: Three times a day (TID) | ORAL | Status: DC | PRN

## 2023-11-27 MED ORDER — PAXLOVID (300/100) 20 X 150 MG & 10 X 100MG PO TBPK: 3.0000 | ORAL_TABLET | Freq: Two times a day (BID) | ORAL | 0 refills | Status: DC

## 2023-11-29 MED ORDER — TRAZODONE HCL 50 MG PO TABS: 25.0000 mg | ORAL_TABLET | Freq: Every evening | ORAL | 3 refills | Status: DC | PRN

## 2023-11-30 ENCOUNTER — Other Ambulatory Visit: Payer: Self-pay

## 2023-11-30 NOTE — Telephone Encounter (Signed)
 Patient has been updated.

## 2023-12-02 ENCOUNTER — Encounter: Payer: Self-pay | Admitting: *Deleted

## 2023-12-03 ENCOUNTER — Other Ambulatory Visit: Payer: Self-pay

## 2023-12-05 MED ORDER — PANTOPRAZOLE SODIUM 40 MG PO TBEC: 40.0000 mg | DELAYED_RELEASE_TABLET | Freq: Two times a day (BID) | ORAL | 3 refills | Status: AC

## 2023-12-06 ENCOUNTER — Ambulatory Visit: Admitting: Physician Assistant

## 2023-12-06 NOTE — Progress Notes (Deleted)
  Cardiology Office Note:  .   Date:  12/06/2023  ID:  [name] [name], DOB [birth year], MRN [id number] PCP: Suzen Houston NOVAK DO  [facility] Providers Cardiologist:  Redell Shallow, MD {  History of Present Illness: .   [name] [name] is a 73 y.o. female with history of CAD with DES to RI 02/2017     CAD DES to RI 02/2017 Left heart catheterization 07/2023 no progression of CAD.  Patent stent.  Palpitations Heart monitor 09/2023 with 17 beats VT, 14 episodes of NSVT, longest episode 14 seconds.  PVCs/PACs less than 1%.  Symptoms correlated with sinus rhythm and NSVT.  Other EF normal 10/2020 Mild carotid artery disease 1-39% stenosis bilaterally 03/2021.  Social history      Patient with history of DES to RI but with reassuring left heart catheterization 07/2023.  Last appointment 08/2023 had been reporting palpitations. heart monitor demonstrated occasional PACs, short runs of SVT, 17 beats VT.  Toprol -XL increased to 75 mg.  She was seen in the ED for headaches incidentally found saccular aneurysm within the left MCA, CT venogram negative.  Neurology recommended outpatient follow-up.  Also COVID-positive.  Palpitations Heart monitor 09/2023 with 17 beats VT, 14 episodes of NSVT, longest episode 14 seconds.  PVCs/PACs less than 1%.  Symptoms correlated with sinus rhythm and NSVT.  CAD with DES to RI 02/2017 - LHC 07/2023, patent stent. Stable disease.  ROS: Denies: Chest pain, shortness of breath, orthopnea, peripheral edema, palpitations, decreased exercise intolerance, fatigue, lightheadedness.   Studies Reviewed: .         Risk Assessment/Calculations:   {Does this patient have ATRIAL FIBRILLATION?:[id number]} No BP recorded.  {Refresh Note OR Click here to enter BP  :1}***       Physical Exam:   VS:  There were no vitals taken for this visit.   Wt Readings from Last 3 Encounters:  11/05/23 167 lb 12.8 oz (76.1 kg)  09/03/23 157 lb 12.8 oz (71.6 kg)   08/17/23 154 lb 6 oz (70 kg)    GEN: Well nourished, well developed in no acute distress NECK: No JVD; No carotid bruits CARDIAC: ***RRR, no murmurs, rubs, gallops RESPIRATORY:  Clear to auscultation without rales, wheezing or rhonchi  ABDOMEN: Soft, non-tender, non-distended EXTREMITIES:  No edema; No deformity   ASSESSMENT AND PLAN: .     OSA Chronic complaints of insomnia.  PCP ordered sleep study.     {Are you ordering a CV Procedure (e.g. stress test, cath, DCCV, TEE, etc)?   Press F2        :[id number]}  Dispo: ***  Signed, Thom LITTIE Sluder, PA-C

## 2023-12-13 LAB — HM MAMMOGRAPHY

## 2023-12-16 ENCOUNTER — Ambulatory Visit (INDEPENDENT_AMBULATORY_CARE_PROVIDER_SITE_OTHER)

## 2023-12-16 VITALS — BP 141/61 | HR 61 | Ht 62.0 in | Wt 165.4 lb

## 2023-12-16 DIAGNOSIS — M25512 Pain in left shoulder: Secondary | ICD-10-CM

## 2023-12-16 DIAGNOSIS — R252 Cramp and spasm: Secondary | ICD-10-CM

## 2023-12-16 DIAGNOSIS — G479 Sleep disorder, unspecified: Secondary | ICD-10-CM | POA: Diagnosis not present

## 2023-12-16 DIAGNOSIS — I671 Cerebral aneurysm, nonruptured: Secondary | ICD-10-CM | POA: Diagnosis not present

## 2023-12-16 NOTE — Patient Instructions (Signed)
   It was great to see you!  Our plans for today:  - Sport Medicine referral for right shoulder pain - Neurology referral - Follow up sleep medicine - Follow up w/ cardiology - follow up in 1 month  - Try the following to help you sleep better:  - limit naps during the day  - no screens (TV, phone, tablet, computer) at least 1-2 hours before bedtime.  - have a quiet and dark sleeping environment.  - no large meals or drinks about 1 hour before bed.  - Avoid taking diuretics (hydrochlorothiazide , furosemide) in the evenings.  - Avoid caffeine after 3pm.  - Exercise or move your body regularly every day.  - You can also try melatonin 5 mg over the counter. Take this 1-2 hours before bed. - If you are lying in bed for 30 mins-1 hour and aren't falling asleep, get out of bed and do something relaxing like reading (NO TV!) until you are tired.  Take care and seek immediate care sooner if you develop any concerns.       Houston Coralee HAS PGY 1 Family Medicine Resident [facility]  [address] [place], [place] [address] Fax [phone] Phone [phone] 12/16/2023, 3:18 PM

## 2023-12-17 NOTE — Progress Notes (Signed)
    SUBJECTIVE:   CHIEF COMPLAINT / HPI: follow up after ED visit  [name] [name] present at the [facility] clinic today to follow up after her ED visit due to HA. During her ED visit CT angio of the head and neck shows an incidental finding of a saccular aneurysm in the left MCA w/ negative CT venogram.   Since then her head ache has been improving but still w/ left shoulder pain and limited movement she also c/o insomnia which has not been improved w/ Melatonin or Trazodone .   Her BP has also been elevated but she has appointment with cardiology later this month She denies vision change, SOB, Palpitation, or s/s of infection    PERTINENT  PMH / PSH:  HTN Mitral Valve Disorder Obesity DM Difficulty sleeping  Smoker Stress incontinence  OBJECTIVE:   BP (!) 141/61   Pulse 61   Ht 5' 2 (1.575 m)   Wt 165 lb 6.4 oz (75 kg)   SpO2 99%   BMI 30.25 kg/m    Physical Exam Constitutional:      Appearance: Normal appearance. She is obese.  Cardiovascular:     Rate and Rhythm: Normal rate.     Pulses: Normal pulses.  Pulmonary:     Effort: Pulmonary effort is normal.     Breath sounds: Normal breath sounds.  Abdominal:     Palpations: Abdomen is soft.  Musculoskeletal:        General: Tenderness left shoulder present.  Skin:    Capillary Refill: Capillary refill takes less than 2 seconds.  Neurological:     General: No focal deficit present.     Mental Status: She is alert and oriented to person, place, and time.     Cranial Nerves: No cranial nerve deficit.  Psychiatric:        Mood and Affect: Mood normal.        Behavior: Behavior normal.  ASSESSMENT/PLAN:   Assessment & Plan Left shoulder pain, unspecified chronicity - Sport medicine referral for possible left shoulder US  and intervention Saccular aneurysm CTA head and neck on 11/26/23 show 3 mm saccular aneurysm arising from the left MCA bifurcation. Neurology was consult during ED visit and plan to f/u outpatient -  Neurology referral  Sleeping difficulties - continue Trazodone  25 mg at bedtime  - Sleep study schedule 01/16/24 - f/u in 1 month    Houston Samuels, DO PGY 1  [facility]

## 2023-12-20 ENCOUNTER — Other Ambulatory Visit: Payer: Self-pay

## 2023-12-20 ENCOUNTER — Ambulatory Visit (INDEPENDENT_AMBULATORY_CARE_PROVIDER_SITE_OTHER): Admitting: Family Medicine

## 2023-12-20 ENCOUNTER — Encounter: Payer: Self-pay | Admitting: Family Medicine

## 2023-12-20 ENCOUNTER — Encounter: Payer: Self-pay | Admitting: *Deleted

## 2023-12-20 VITALS — BP 122/68 | Ht 62.0 in | Wt 165.0 lb

## 2023-12-20 DIAGNOSIS — G8929 Other chronic pain: Secondary | ICD-10-CM

## 2023-12-20 DIAGNOSIS — M25512 Pain in left shoulder: Secondary | ICD-10-CM | POA: Diagnosis not present

## 2023-12-20 MED ORDER — METHYLPREDNISOLONE ACETATE 40 MG/ML IJ SUSP
40.0000 mg | Freq: Once | INTRAMUSCULAR | Status: AC
  Administered 2023-12-20: 40 mg via INTRA_ARTICULAR

## 2023-12-20 NOTE — Progress Notes (Cosign Needed)
 PCP: Suzen Houston NOVAK, DO  Subjective:   HPI: Patient is a 73 y.o. female here for L shoulder pain over the last few weeks. Pt was seen by PCP 12/16/23 and reported limited movement of the shoulder so she was referred here.  Patient states that the pain came out of nowhere, she did not injure her shoulder.  It feels similar to the pain she has experienced in the past and her right shoulder that improved with steroid injections.  No history of injury to her left shoulder.  Reports that the pain is worse in the morning and she has had difficulty putting her shirt on.  Past Medical History:  Diagnosis Date   Arthritis    Blood transfusion without reported diagnosis    Cataract    Coronary artery disease    Depression    Hyperlipidemia    Hypertension    Microcytic anemia    Mitral valve prolapse     Current Outpatient Medications on File Prior to Visit  Medication Sig Dispense Refill   amLODipine  (NORVASC ) 5 MG tablet TAKE 1 TABLET (5 MG TOTAL) BY MOUTH DAILY. 90 tablet 3   ammonium lactate  (LAC-HYDRIN ) 12 % lotion APPLY 1 APPLICATION TOPICALLY AS NEEDED FOR DRY SKIN. 400 mL 1   aspirin  81 MG tablet Take 81 mg by mouth daily.     atorvastatin  (LIPITOR) 80 MG tablet Take 1 tablet (80 mg total) by mouth daily. 90 tablet 3   cycloSPORINE (RESTASIS) 0.05 % ophthalmic emulsion 1 drop 2 (two) times daily. Uses PRN     diclofenac  Sodium (VOLTAREN ) 1 % GEL Apply 2 g topically 4 (four) times daily as needed (pain). 150 g 1   ezetimibe  (ZETIA ) 10 MG tablet Take 1 tablet (10 mg total) by mouth daily. 90 tablet 3   fluticasone  (FLONASE ) 50 MCG/ACT nasal spray Place 2 sprays into both nostrils daily. 16 g 4   gabapentin  (NEURONTIN ) 100 MG capsule TAKE 1 CAPSULE BY MOUTH EVERY MORNING AND 1 CAPSULE DAILY WITH LUNCH AND 2 CAPSULES AT BEDTIME. 120 capsule 3   hydrOXYzine  (ATARAX ) 10 MG tablet Take 1 tablet (10 mg total) by mouth 3 (three) times daily as needed. 60 tablet 1   melatonin 3 MG TABS tablet  Take 1 tablet (3 mg total) by mouth at bedtime. 30 tablet 11   metoprolol  succinate (TOPROL  XL) 50 MG 24 hr tablet Take 1.5 tablets (75 mg total) by mouth daily. 135 tablet 3   nitroGLYCERIN  (NITROSTAT ) 0.4 MG SL tablet Place 1 tablet (0.4 mg total) under the tongue every 5 (five) minutes as needed for chest pain. DISSOLVE 1 TABLET UNDER THE TONGUE EVERY 5 MINUTES AS NEEDED FOR CHEST PAIN. DO NOT EXCEED A TOTAL OF 3 DOSES IN 15 MINUTES. (ORIG) 25 tablet 3   ondansetron  (ZOFRAN ) 4 MG tablet Take 1 tablet (4 mg total) by mouth every 8 (eight) hours as needed for nausea or vomiting. 4 tablet 0   pantoprazole  (PROTONIX ) 40 MG tablet Take 1 tablet (40 mg total) by mouth 2 (two) times daily. 180 tablet 3   Polyethyl Glycol-Propyl Glycol (SYSTANE) 0.4-0.3 % GEL ophthalmic gel Place 1 Application into both eyes.     polyethylene glycol powder (GLYCOLAX /MIRALAX ) 17 GM/SCOOP powder Take 17 g by mouth 2 (two) times daily as needed. 3350 g 1   PREMARIN  vaginal cream PLACE 1 APPLICATORFUL VAGINALLY DAILY. 30 g 12   Semaglutide , 1 MG/DOSE, 4 MG/3ML SOPN Inject 1 mg into the skin once a week.  3 mL 2   sertraline  (ZOLOFT ) 100 MG tablet Take 1 tablet (100 mg total) by mouth daily. 90 tablet 1   sertraline  (ZOLOFT ) 50 MG tablet Take 1 tablet (50 mg total) by mouth daily. 90 tablet 1   terbinafine  (LAMISIL ) 250 MG tablet Take 1 tablet (250 mg total) by mouth daily. (Patient not taking: Reported on 12/16/2023) 30 tablet 0   tizanidine  (ZANAFLEX ) 2 MG capsule Take 1-2 capsules (2-4 mg total) by mouth 3 (three) times daily as needed for muscle spasms. 40 capsule 0   traZODone  (DESYREL ) 50 MG tablet Take 0.5 tablets (25 mg total) by mouth at bedtime as needed for sleep. 30 tablet 3   valsartan  (DIOVAN ) 40 MG tablet Take 1 tablet (40 mg total) by mouth daily. 90 tablet 3   No current facility-administered medications on file prior to visit.    Past Surgical History:  Procedure Laterality Date   ABDOMINAL HYSTERECTOMY      CARDIAC CATHETERIZATION     CATARACT EXTRACTION, BILATERAL     COLONOSCOPY     greater than 10 years ago   CORONARY PRESSURE/FFR STUDY N/A 03/01/2017   Procedure: INTRAVASCULAR PRESSURE WIRE/FFR STUDY;  Surgeon: Verlin Lonni BIRCH, MD;  Location: [facility] INVASIVE CV LAB;  Service: Cardiovascular;  Laterality: N/A;   CORONARY STENT INTERVENTION N/A 03/01/2017   Procedure: CORONARY STENT INTERVENTION;  Surgeon: Verlin Lonni BIRCH, MD;  Location: [facility] INVASIVE CV LAB;  Service: Cardiovascular;  Laterality: N/A;   LEFT HEART CATH AND CORONARY ANGIOGRAPHY N/A 03/01/2017   Procedure: LEFT HEART CATH AND CORONARY ANGIOGRAPHY;  Surgeon: Verlin Lonni BIRCH, MD;  Location: [facility] INVASIVE CV LAB;  Service: Cardiovascular;  Laterality: N/A;   LEFT HEART CATH AND CORONARY ANGIOGRAPHY N/A 08/16/2023   Procedure: LEFT HEART CATH AND CORONARY ANGIOGRAPHY;  Surgeon: Ladona Heinz, MD;  Location: [facility] INVASIVE CV LAB;  Service: Cardiovascular;  Laterality: N/A;   PARTIAL HYSTERECTOMY      No Known Allergies  BP 122/68   Ht 5' 2 (1.575 m)   Wt 165 lb (74.8 kg)   BMI 30.18 kg/m       No data to display              No data to display              Objective:  Physical Exam:  Gen: NAD, comfortable in exam room L Shoulder: No obvious deformity, swelling, or ecchymoses. No TTP. Full passive ROM. Limited active flexion, adduction, and abduction secondary to pain. Positive Hawkins, Neers. 5/5 strength with empty can and resisted internal/external rotation. Normal sensation.   Ultrasound limited left shoulder: Small tear of supraspinatus. Subacromial bursitis.    Assessment & Plan:  1. L shoulder pain, rotator cuff impingement Subacromial corticosteroid injection given today. No indication for MRI at this time.  - continue tylenol , ibuprofen  PRN - avoid painful activities  - home exercise program - Consider additional imaging, PT if not improving - F/u 1 month

## 2023-12-20 NOTE — Patient Instructions (Signed)
 You have rotator cuff impingement Try to avoid painful activities (overhead activities, lifting with extended arm) as much as possible. Tylenol  as needed for pain Subacromial injection may be beneficial to help with pain and to decrease inflammation - you were given this today Consider physical therapy with transition to home exercise program. Do home exercise program with theraband and scapular stabilization exercises daily 3 sets of 10 once a day - wait at least 3-5 days before starting this. If not improving at follow-up we will consider further imaging, physical therapy, and/or nitro patches. Follow up with me in 1 month but call me sooner if you're struggling.

## 2023-12-21 ENCOUNTER — Ambulatory Visit: Attending: Physician Assistant | Admitting: Physician Assistant

## 2023-12-21 ENCOUNTER — Encounter: Payer: Self-pay | Admitting: Physician Assistant

## 2023-12-21 VITALS — BP 134/84 | HR 67 | Ht 62.0 in | Wt 167.2 lb

## 2023-12-21 DIAGNOSIS — I10 Essential (primary) hypertension: Secondary | ICD-10-CM | POA: Diagnosis not present

## 2023-12-21 DIAGNOSIS — I4719 Other supraventricular tachycardia: Secondary | ICD-10-CM | POA: Diagnosis not present

## 2023-12-21 DIAGNOSIS — I2510 Atherosclerotic heart disease of native coronary artery without angina pectoris: Secondary | ICD-10-CM | POA: Diagnosis not present

## 2023-12-21 DIAGNOSIS — E7800 Pure hypercholesterolemia, unspecified: Secondary | ICD-10-CM

## 2023-12-21 DIAGNOSIS — I4729 Other ventricular tachycardia: Secondary | ICD-10-CM | POA: Insufficient documentation

## 2023-12-21 DIAGNOSIS — I739 Peripheral vascular disease, unspecified: Secondary | ICD-10-CM

## 2023-12-21 MED ORDER — METOPROLOL SUCCINATE ER 100 MG PO TB24: 100.0000 mg | ORAL_TABLET | Freq: Every day | ORAL | 3 refills | Status: AC

## 2023-12-21 NOTE — Assessment & Plan Note (Signed)
 History of DES to the RI in December 2018.  Cardiac catheterization May 2025 with patent RI stent.  She has occasional anginal symptoms which are overall stable. -Continue aspirin  81 mg daily, Lipitor 80 mg daily, Zetia  10 mg daily, nitroglycerin  as needed,  Amlodipine  5 mg daily -Increase metoprolol  succinate as noted to 100 mg daily -Follow-up 6 months as planned

## 2023-12-21 NOTE — Assessment & Plan Note (Signed)
 Blood pressure borderline.  Blood pressures were elevated at primary care recently. -Increase metoprolol  succinate to 100 mg daily -Continue amlodipine  5 mg daily, valsartan  40 mg daily -Consider increasing valsartan  if blood pressure remains above goal

## 2023-12-21 NOTE — Assessment & Plan Note (Signed)
 Recent monitor with 1 episode of nonsustained ventricular tachycardia and several episodes of SVT/atrial tachycardia.  Symptoms of palpitations have improved somewhat since increasing her metoprolol  succinate.  She has had blood pressures running above goal recently as well.  I have recommended that we increase her metoprolol  further to 100 mg daily.  She had a recent potassium that was normal.  Given NSVT, I will arrange echocardiogram to ensure her LV function remains normal. -Increase metoprolol  succinate to 100 mg daily -Obtain echocardiogram -Follow-up 6 months as planned

## 2023-12-21 NOTE — Patient Instructions (Addendum)
 Medication Instructions:  Increase Metoprolol  succinate to 100 mg once daily (you can take 2 of the 50 mg tablets to equal 100 mg)  *If you need a refill on your cardiac medications before your next appointment, please call your pharmacy*  Lab Work: Go to the lab fasting sometime in the next 2 weeks. Do not eat after midnight. You can drink only black coffee or water before the labs. Make sure you bring the papers Dr. Pietro gave you to get the labs.  If you have labs (blood work) drawn today and your tests are completely normal, you will receive your results only by: MyChart Message (if you have MyChart) OR A paper copy in the mail If you have any lab test that is abnormal or we need to change your treatment, we will call you to review the results.  Testing/Procedures: Echocardiogram -   Your physician has requested that you have an echocardiogram. Echocardiography is a painless test that uses sound waves to create images of your heart. It provides your doctor with information about the size and shape of your heart and how well your heart's chambers and valves are working. This procedure takes approximately one hour. There are no restrictions for this procedure. Please do NOT wear cologne, perfume, aftershave, or lotions (deodorant is allowed). Please arrive 15 minutes prior to your appointment time.  Please note: We ask at that you not bring children with you during ultrasound (echo/ vascular) testing. Due to room size and safety concerns, children are not allowed in the ultrasound rooms during exams. Our front office staff cannot provide observation of children in our lobby area while testing is being conducted. An adult accompanying a patient to their appointment will only be allowed in the ultrasound room at the discretion of the ultrasound technician under special circumstances. We apologize for any inconvenience.    Your physician has requested that you have a lower extremity arterial  exercise duplex WITH ABI'S. SABRA During this test, exercise and ultrasound are used to evaluate arterial blood flow in the legs. Allow one hour for this exam. There are no restrictions or special instructions.  Follow-Up: At [facility], you and your health needs are our priority.  As part of our continuing mission to provide you with exceptional heart care, our providers are all part of one team.  This team includes your primary Cardiologist (physician) and Advanced Practice Providers or APPs (Physician Assistants and Nurse Practitioners) who all work together to provide you with the care you need, when you need it.  Your next appointment:   6 month(s)  Provider:   Redell Pietro, MD    We recommend signing up for the patient portal called MyChart.  Sign up information is provided on this After Visit Summary.  MyChart is used to connect with patients for Virtual Visits (Telemedicine).  Patients are able to view lab/test results, encounter notes, upcoming appointments, etc.  Non-urgent messages can be sent to your provider as well.   To learn more about what you can do with MyChart, go to [email or web address].   Other Instructions Call if your blood pressure is consistently above 130/80

## 2023-12-21 NOTE — Progress Notes (Signed)
 OFFICE NOTE:    Date:  12/21/2023  ID:  [name] [name], DOB 1951-03-15, MRN [id number] PCP: Suzen Houston NOVAK, DO  [facility] Providers Cardiologist:  Redell Shallow, MD        Coronary artery disease  S/p 3 x 12 mm DES to RI in 02/2017 TTE 11/07/2020: EF 60-65, no RWMA, normal RVSF, trivial MR, mild AV sclerosis LHC 08/16/2023: RCA mid 20, LM calcified, LAD proximal mild calcification, RI stent patent Mitral valve prolapse  Paroxysmal atrial tachycardia  Monitor 08/2023: NSVT x 17 beats; SVT x 14 (longest 14 seconds), some episodes of SVT may be atrial tachycardia with variable block; PACs/PVCs <1%; symptoms associated with NSR + NSVT Hypertension Hyperlipidemia Carotid US  04/2021: Bilateral ICA 1-39       Discussed the use of AI scribe software for clinical note transcription with the patient, who gave verbal consent to proceed. History of Present Illness [name] [name] is a 73 y.o. female  Last seen by Dr. Shallow in 08/2023. Pt had palpitations. Monitor was placed and demonstrated sinus rhythm.  There were occasional PACs and short runs of SVT.  There was one 17 beat run of nonsustained ventricular tachycardia and rare PVCs.  Metoprolol  was increased to 75 mg daily.  She reports the increased dose of metoprolol  succinate has helped some, but she still experiences palpitations occasionally. Her blood pressure has been running high, with recent readings around 141/61. She is currently on amlodipine , metoprolol , and valsartan  for hypertension. She is concerned about the number of medications she is taking and prefers not to add a fourth blood pressure medication. She occasionally experiences chest discomfort, described as a feeling that comes over her but does not require nitroglycerin . She also reports pain in the groin area of both her legs, particularly the right leg, which feels like burning after climbing stairs. She does not engage in regular exercise but tries to  incorporate physical activity by taking stairs when possible. No significant shortness of breath, swelling in her legs, or passing out.     ROS-See HPI    Studies Reviewed:              Physical Exam:  VS:  BP 134/84   Pulse 67   Ht 5' 2 (1.575 m)   Wt 167 lb 3.2 oz (75.8 kg)   SpO2 98%   BMI 30.58 kg/m        Wt Readings from Last 3 Encounters:  12/21/23 167 lb 3.2 oz (75.8 kg)  12/20/23 165 lb (74.8 kg)  12/16/23 165 lb 6.4 oz (75 kg)    Constitutional:      Appearance: Healthy appearance. Not in distress.  Neck:     Vascular: JVD normal.  Pulmonary:     Breath sounds: Normal breath sounds. No wheezing. No rales.  Cardiovascular:     Normal rate. Regular rhythm.     Murmurs: There is no murmur.     Comments: DP/PT 1+ bilat; no FA bruits bilat Edema:    Peripheral edema present.    Pretibial: bilateral trace edema of the pretibial area. Abdominal:     Palpations: Abdomen is soft.       Assessment and Plan:    Assessment & Plan Paroxysmal atrial tachycardia Nonsustained ventricular tachycardia (HCC) Recent monitor with 1 episode of nonsustained ventricular tachycardia and several episodes of SVT/atrial tachycardia.  Symptoms of palpitations have improved somewhat since increasing her metoprolol  succinate.  She has had blood pressures running above  goal recently as well.  I have recommended that we increase her metoprolol  further to 100 mg daily.  She had a recent potassium that was normal.  Given NSVT, I will arrange echocardiogram to ensure her LV function remains normal. -Increase metoprolol  succinate to 100 mg daily -Obtain echocardiogram -Follow-up 6 months as planned Coronary artery disease involving native coronary artery of native heart without angina pectoris History of DES to the RI in December 2018.  Cardiac catheterization May 2025 with patent RI stent.  She has occasional anginal symptoms which are overall stable. -Continue aspirin  81 mg daily,  Lipitor 80 mg daily, Zetia  10 mg daily, nitroglycerin  as needed,  Amlodipine  5 mg daily -Increase metoprolol  succinate as noted to 100 mg daily -Follow-up 6 months as planned Essential hypertension Blood pressure borderline.  Blood pressures were elevated at primary care recently. -Increase metoprolol  succinate to 100 mg daily -Continue amlodipine  5 mg daily, valsartan  40 mg daily -Consider increasing valsartan  if blood pressure remains above goal Hypercholesterolemia Goal LDL at least less than 70, ideally less than 55.   -Continue Lipitor 80 mg daily, Zetia  10 mg daily.   -She has labs pending for follow-up lipids and LFTs.  I have encouraged her to get these. Claudication She notes burning in her upper thighs with ambulation.  She has decent pulses peripherally and no femoral artery bruits.  However, with her risk factors, she is certainly at risk for peripheral arterial disease .-Obtain ABI/arterial Dopplers         Dispo:  Return in about 6 months (around 06/19/2024) for Routine Follow Up w/ Dr. Pietro.  Signed, Glendia Ferrier, PA-C

## 2023-12-22 ENCOUNTER — Ambulatory Visit: Payer: Self-pay

## 2023-12-28 ENCOUNTER — Other Ambulatory Visit: Payer: Self-pay

## 2023-12-28 DIAGNOSIS — G63 Polyneuropathy in diseases classified elsewhere: Secondary | ICD-10-CM

## 2023-12-29 ENCOUNTER — Other Ambulatory Visit: Payer: Self-pay

## 2023-12-30 ENCOUNTER — Other Ambulatory Visit: Payer: Self-pay

## 2023-12-30 DIAGNOSIS — G63 Polyneuropathy in diseases classified elsewhere: Secondary | ICD-10-CM

## 2023-12-30 MED ORDER — GABAPENTIN 100 MG PO CAPS: 100.0000 mg | ORAL_CAPSULE | Freq: Two times a day (BID) | ORAL | 3 refills | Status: DC

## 2023-12-31 ENCOUNTER — Telehealth: Payer: Self-pay

## 2024-01-06 ENCOUNTER — Other Ambulatory Visit (HOSPITAL_COMMUNITY): Payer: Self-pay

## 2024-01-10 ENCOUNTER — Ambulatory Visit: Admitting: Family Medicine

## 2024-01-10 ENCOUNTER — Encounter: Payer: Self-pay | Admitting: Family Medicine

## 2024-01-10 VITALS — BP 138/63 | Ht 62.0 in | Wt 160.0 lb

## 2024-01-10 DIAGNOSIS — M25512 Pain in left shoulder: Secondary | ICD-10-CM

## 2024-01-10 DIAGNOSIS — G8929 Other chronic pain: Secondary | ICD-10-CM | POA: Diagnosis not present

## 2024-01-10 MED ORDER — HYDROCODONE-ACETAMINOPHEN 5-325 MG PO TABS: 1.0000 | ORAL_TABLET | Freq: Four times a day (QID) | ORAL | 0 refills | Status: AC | PRN

## 2024-01-10 MED ORDER — PREDNISONE 10 MG PO TABS: ORAL_TABLET | ORAL | 0 refills | Status: AC

## 2024-01-10 NOTE — Patient Instructions (Signed)
  VISIT SUMMARY: You came in today because of severe and persistent shoulder pain that radiates to your neck and chest. The pain has been affecting your daily activities and has not been relieved by previous treatments.  YOUR PLAN: -ADHESIVE CAPSULITIS OF LEFT SHOULDER: Adhesive capsulitis, also known as frozen shoulder, is a condition where the shoulder becomes stiff and painful due to scar tissue formation. We will order an MRI to check for any rotator cuff injury. You will start a 6-day course of prednisone to reduce inflammation and take hydrocodone  for severe pain. Please avoid using Tylenol  while taking hydrocodone . Physical therapy is not recommended at this stage as it may worsen your pain.   INSTRUCTIONS: Please schedule an MRI for your left shoulder as soon as possible. Avoid using Tylenol  while taking hydrocodone . We will see you again for a follow-up appointment to review your MRI results and discuss further management.

## 2024-01-10 NOTE — Progress Notes (Signed)
 PCP: Suzen Houston NOVAK, DO  Subjective:   Discussed the use of AI scribe software for clinical note transcription with the patient, who gave verbal consent to proceed.  History of Present Illness [name] [name] is a 73 year old female who presents with persistent left shoulder pain.  Shoulder pain - Severe, persistent pain in the shoulder radiating to the neck and chest - Pain is constant and worsened by movement or pressure - Unable to perform movements such as reaching across her body or lifting light objects - Pain significantly impacts daily activities, including home maintenance - No associated redness, swelling, warmth, fever, sweats, or shortness of breath  Prior treatments and medication intolerance - Received a cortisone injection three weeks ago with partial relief - Topical treatments and heat application provide limited relief - Unable to take NSAIDs - Uses tizanidine  without effect  Past Medical History:  Diagnosis Date   Arthritis    Blood transfusion without reported diagnosis    Cataract    Coronary artery disease    Depression    Hyperlipidemia    Hypertension    Microcytic anemia    Mitral valve prolapse     Current Outpatient Medications on File Prior to Visit  Medication Sig Dispense Refill   amLODipine  (NORVASC ) 5 MG tablet TAKE 1 TABLET (5 MG TOTAL) BY MOUTH DAILY. 90 tablet 3   ammonium lactate  (LAC-HYDRIN ) 12 % lotion APPLY 1 APPLICATION TOPICALLY AS NEEDED FOR DRY SKIN. 400 mL 1   aspirin  81 MG tablet Take 81 mg by mouth daily.     atorvastatin  (LIPITOR) 80 MG tablet Take 1 tablet (80 mg total) by mouth daily. 90 tablet 3   cycloSPORINE (RESTASIS) 0.05 % ophthalmic emulsion 1 drop 2 (two) times daily. Uses PRN     diclofenac  Sodium (VOLTAREN ) 1 % GEL Apply 2 g topically 4 (four) times daily as needed (pain). 150 g 1   ezetimibe  (ZETIA ) 10 MG tablet Take 1 tablet (10 mg total) by mouth daily. 90 tablet 3   fluticasone  (FLONASE ) 50 MCG/ACT nasal  spray Place 2 sprays into both nostrils daily. 16 g 4   gabapentin  (NEURONTIN ) 100 MG capsule PLEASE SEE ATTACHED FOR DETAILED DIRECTIONS 180 capsule 3   hydrOXYzine  (ATARAX ) 10 MG tablet Take 1 tablet (10 mg total) by mouth 3 (three) times daily as needed. (Patient not taking: Reported on 12/21/2023) 60 tablet 1   melatonin 3 MG TABS tablet Take 1 tablet (3 mg total) by mouth at bedtime. 30 tablet 11   metoprolol  succinate (TOPROL -XL) 100 MG 24 hr tablet Take 1 tablet (100 mg total) by mouth daily. Take with or immediately following a meal. 90 tablet 3   nitroGLYCERIN  (NITROSTAT ) 0.4 MG SL tablet Place 1 tablet (0.4 mg total) under the tongue every 5 (five) minutes as needed for chest pain. DISSOLVE 1 TABLET UNDER THE TONGUE EVERY 5 MINUTES AS NEEDED FOR CHEST PAIN. DO NOT EXCEED A TOTAL OF 3 DOSES IN 15 MINUTES. (ORIG) 25 tablet 3   ondansetron  (ZOFRAN ) 4 MG tablet Take 1 tablet (4 mg total) by mouth every 8 (eight) hours as needed for nausea or vomiting. 4 tablet 0   pantoprazole  (PROTONIX ) 40 MG tablet Take 1 tablet (40 mg total) by mouth 2 (two) times daily. 180 tablet 3   Polyethyl Glycol-Propyl Glycol (SYSTANE) 0.4-0.3 % GEL ophthalmic gel Place 1 Application into both eyes.     polyethylene glycol powder (GLYCOLAX /MIRALAX ) 17 GM/SCOOP powder Take 17 g by mouth 2 (  two) times daily as needed. 3350 g 1   PREMARIN  vaginal cream PLACE 1 APPLICATORFUL VAGINALLY DAILY. 30 g 12   Semaglutide , 1 MG/DOSE, 4 MG/3ML SOPN Inject 1 mg into the skin once a week. 3 mL 2   sertraline  (ZOLOFT ) 100 MG tablet Take 1 tablet (100 mg total) by mouth daily. 90 tablet 1   sertraline  (ZOLOFT ) 50 MG tablet Take 1 tablet (50 mg total) by mouth daily. 90 tablet 1   terbinafine  (LAMISIL ) 250 MG tablet Take 1 tablet (250 mg total) by mouth daily. 30 tablet 0   tizanidine  (ZANAFLEX ) 2 MG capsule Take 1-2 capsules (2-4 mg total) by mouth 3 (three) times daily as needed for muscle spasms. 40 capsule 0   traZODone  (DESYREL ) 50  MG tablet Take 0.5 tablets (25 mg total) by mouth at bedtime as needed for sleep. 30 tablet 3   valsartan  (DIOVAN ) 40 MG tablet Take 1 tablet (40 mg total) by mouth daily. 90 tablet 3   No current facility-administered medications on file prior to visit.    Past Surgical History:  Procedure Laterality Date   ABDOMINAL HYSTERECTOMY     CARDIAC CATHETERIZATION     CATARACT EXTRACTION, BILATERAL     COLONOSCOPY     greater than 10 years ago   CORONARY PRESSURE/FFR STUDY N/A 03/01/2017   Procedure: INTRAVASCULAR PRESSURE WIRE/FFR STUDY;  Surgeon: Verlin Lonni BIRCH, MD;  Location: [facility];  Service: Cardiovascular;  Laterality: N/A;   CORONARY STENT INTERVENTION N/A 03/01/2017   Procedure: CORONARY STENT INTERVENTION;  Surgeon: Verlin Lonni BIRCH, MD;  Location: [facility];  Service: Cardiovascular;  Laterality: N/A;   LEFT HEART CATH AND CORONARY ANGIOGRAPHY N/A 03/01/2017   Procedure: LEFT HEART CATH AND CORONARY ANGIOGRAPHY;  Surgeon: Verlin Lonni BIRCH, MD;  Location: [facility];  Service: Cardiovascular;  Laterality: N/A;   LEFT HEART CATH AND CORONARY ANGIOGRAPHY N/A 08/16/2023   Procedure: LEFT HEART CATH AND CORONARY ANGIOGRAPHY;  Surgeon: Ladona Heinz, MD;  Location: [facility];  Service: Cardiovascular;  Laterality: N/A;   PARTIAL HYSTERECTOMY      No Known Allergies  BP 138/63   Ht 5' 2 (1.575 m)   Wt 160 lb (72.6 kg)   BMI 29.26 kg/m       No data to display              No data to display              Objective:  Physical Exam:  Gen: NAD, comfortable in exam room  Left shoulder: No swelling, ecchymoses.  No gross deformity, warmth, or redness. Diffuse tenderness about shoulder. Guarding both passive and active motion - able to externally rotate to 50 degrees passively. Strength 5/5 with resisted internal/external rotation.  Pain with all motions.  Unable to position for empty can due to pain. NV intact  distally. Assessment & Plan Adhesive capsulitis of left shoulder Persistent severe pain and limited range of motion suggest adhesive capsulitis. Previous corticosteroid injection minimally effective into subacromial bursa. Likely in initial painful phase with scar tissue formation of adhesive capsulitis. MRI needed to rule out rotator cuff injury. NSAIDs intolerable due to her CAD, muscle relaxants ineffective. Physical therapy not recommended due to pain exacerbation risk. - Order MRI of the left shoulder to evaluate for rotator cuff injury. - Prescribe a 6-day course of prednisone for anti-inflammatory effect. - Prescribe hydrocodone  for severe pain management, instruct to avoid concurrent use of  Tylenol . - Avoid physical therapy at this stage as it may exacerbate pain. - Schedule follow-up appointment to review MRI results and discuss further management.

## 2024-01-11 ENCOUNTER — Telehealth: Payer: Self-pay

## 2024-01-11 NOTE — Telephone Encounter (Signed)
 In process of completing Novo Nordisk refills for patients OZEMPIC  1MG  medication.  Application placed in DR MCDIARMID'S box for signature.

## 2024-01-16 ENCOUNTER — Ambulatory Visit (HOSPITAL_BASED_OUTPATIENT_CLINIC_OR_DEPARTMENT_OTHER): Attending: Family Medicine | Admitting: Internal Medicine

## 2024-01-16 DIAGNOSIS — G4733 Obstructive sleep apnea (adult) (pediatric): Secondary | ICD-10-CM | POA: Insufficient documentation

## 2024-01-19 ENCOUNTER — Ambulatory Visit: Admitting: Family Medicine

## 2024-01-20 ENCOUNTER — Other Ambulatory Visit: Payer: Self-pay

## 2024-01-20 ENCOUNTER — Ambulatory Visit

## 2024-01-20 VITALS — BP 132/62 | HR 62 | Ht 62.0 in | Wt 162.6 lb

## 2024-01-20 DIAGNOSIS — G479 Sleep disorder, unspecified: Secondary | ICD-10-CM

## 2024-01-20 DIAGNOSIS — I10 Essential (primary) hypertension: Secondary | ICD-10-CM | POA: Diagnosis not present

## 2024-01-20 DIAGNOSIS — R252 Cramp and spasm: Secondary | ICD-10-CM

## 2024-01-20 DIAGNOSIS — Z0000 Encounter for general adult medical examination without abnormal findings: Secondary | ICD-10-CM

## 2024-01-20 DIAGNOSIS — Z23 Encounter for immunization: Secondary | ICD-10-CM | POA: Diagnosis not present

## 2024-01-20 LAB — POCT GLYCOSYLATED HEMOGLOBIN (HGB A1C): HbA1c, POC (controlled diabetic range): 5.6 % (ref 0.0–7.0)

## 2024-01-20 MED ORDER — TIZANIDINE HCL 2 MG PO CAPS: 2.0000 mg | ORAL_CAPSULE | Freq: Three times a day (TID) | ORAL | 3 refills | Status: DC | PRN

## 2024-01-20 MED ORDER — DICLOFENAC SODIUM 1 % EX GEL: 2.0000 g | Freq: Four times a day (QID) | CUTANEOUS | 2 refills | Status: AC | PRN

## 2024-01-20 MED ORDER — TRAZODONE HCL 50 MG PO TABS: 50.0000 mg | ORAL_TABLET | Freq: Every evening | ORAL | 3 refills | Status: DC | PRN

## 2024-01-20 NOTE — Progress Notes (Signed)
    SUBJECTIVE:   CHIEF COMPLAINT / HPI: follow up for left shoulder pain and sleep difficulty   Ms. Saltsman present at the [facility] today to follow up after last visit for her HA, left shoulder pain, and difficulty sleeping.   Since last visit she has been seeing sport medicine for her left shoulder pain. Since then she received a cortisone injection three weeks ago with partial relief and was placed on prednisone for 6 days and recommended MRI which is pending  She has been sleeping better since start taking Trazodone  25 mg nightly  BP was elevated during the visit but improved w/ recheck  She denies vision change, SOB, Palpitation, or s/s of infection      PERTINENT  PMH / PSH:  Chronic left shoulder pain Difficulty sleeping  HTN Mitral Valve Disorder Obesity DM Smoker Stress incontinence  OBJECTIVE:   There were no vitals taken for this visit.  Physical Exam Constitutional:      Appearance: Normal appearance.  Cardiovascular:     Rate and Rhythm: Normal rate.     Pulses: Normal pulses.  Pulmonary:     Effort: Pulmonary effort is normal.  Abdominal:     Palpations: Abdomen is soft.  Neurological:     General: No focal deficit present.     Mental Status: She is alert and oriented to person, place, and time.  Psychiatric:        Mood and Affect: Mood normal.        Behavior: Behavior normal.      ASSESSMENT/PLAN:   Assessment & Plan Health care maintenance - Received flu and COVID injection today - POCT A1C today - Foot exam w/ no abnormal  Primary hypertension - Elevated BP during visit improved w/ rechecked - UA for microalbumin today - Continue BP home monitor - Consider adjusting HTN medication if BP not at goal during next visit Difficulty sleeping - Pending Sleep study result, will call pt after received the result - Refill Trazodone    F/u in 3 months on 03/27/24  Houston Samuels, DO PGY1 Family Medicine resident [facility]  [facility] Center

## 2024-01-20 NOTE — Assessment & Plan Note (Signed)
-   Pending Sleep study result, will call pt after received the result - Refill Trazodone

## 2024-01-20 NOTE — Patient Instructions (Addendum)
   It was great to see you!  Our plans for today:  - Refilled Trazone, Voltaren  gel, and tizanidine   - GI consult for Colonoscopy - UA for microalbumin d/t HTN - Flu and COVID shots today - Sleep study result pending - f/u in 3 months on 04/28/23   We are checking some labs today, we will release these results to your MyChart.  Take care and seek immediate care sooner if you develop any concerns.     Houston Coralee HAS PGY 1 Family Medicine Resident [facility]  [address] [place], [place] [address] Fax [phone] Phone [phone] 01/20/2024, 3:24 PM

## 2024-01-21 ENCOUNTER — Ambulatory Visit: Payer: Self-pay

## 2024-01-21 LAB — MICROALBUMIN / CREATININE URINE RATIO
Creatinine, Urine: 174.3 mg/dL
Microalb/Creat Ratio: 3 mg/g{creat} (ref 0–29)
Microalbumin, Urine: 5.1 ug/mL

## 2024-01-24 NOTE — Procedures (Signed)
 [facility] [address] [place], [place] [address] Tel: [phone]   Fax: [phone]  Polysomnography Interpretation  Patient Name:  [name], [name] Study Date:  01/16/2024 Referring Physician:  TODD MCDIARMID ([id number]) %%startinterp%% Indications for Polysomnography The patient is a 73 year old Female who is 5' 2 and weighs 158.0 lbs. Her BMI equals 29.1.  A full night polysomnogram was performed to evaluate for -.OSA  Medication was taken at 9:00 pm.    Desyrel    Polysomnogram Data A full night polysomnogram recorded the standard physiologic parameters including EEG, EOG, EMG, EKG, nasal and oral airflow.  Respiratory parameters of chest and abdominal movements were recorded with Respiratory Inductance Plethysmography belts.  Oxygen saturation was recorded by pulse oximetry.   Sleep Architecture The total recording time of the polysomnogram was 372.3 minutes.  The total sleep time was 286.0 minutes.  The patient spent 3.5% of total sleep time in Stage N1, 75.5% in Stage N2, 21.0% in Stages N3, and 0.0% in REM.  Sleep latency was 69.6 minutes.  REM latency was - minutes.  Sleep Efficiency was 76.8%.  Wake after Sleep Onset time was 17.0 minutes.  Respiratory Events The polysomnogram revealed a presence of 10 obstructive, - central, and - mixed apneas resulting in an Apnea index of 2.1 events per hour.  There were 4 hypopneas (>=3% desaturation and/or arousal) resulting in an Apnea\Hypopnea Index (AHI >=3% desaturation and/or arousal) of 2.9 events per hour.  There were - hypopneas (>=4% desaturation) resulting in an Apnea\Hypopnea Index (AHI >=4% desaturation) of 2.1 events per hour.  There were 16 Respiratory Effort Related Arousals resulting in a RERA index of 3.4 events per hour. The Respiratory Disturbance Index is 6.3 events per hour.  The snore index was - events per hour.  Mean oxygen saturation was 92.6%.  The lowest oxygen saturation  during sleep was 89.0%.  Time spent <=88% oxygen saturation was - minutes (-).  Limb Activity There were 19 total limb movements recorded, of this total, 13 were classified as PLMs.  PLM index was 2.7 per hour and PLM associated with Arousals index was 1.0 per hour.  Cardiac Summary The average pulse rate was 70.0 bpm.  The minimum pulse rate was 63.0 bpm while the maximum pulse rate was 90.0 bpm.  Cardiac rhythm was normal/abnormal.  Comments: Occasional apneas and hypopneas, within normal limits, AHI (4%) 2.1/hr. Snoring, at times loud, with Oxygen desaturation to a nadir of 89%, mean 92.6%.  Diagnosis: Primary snoring  Recommendations: Manage for snoring and symptoms based on clinical judgment.   This study was personally reviewed and electronically signed by: Neysa Rama, MD Accredited Board Certified in Sleep Medicine Date/Time: 01/24/24   1:55    %%endinterp%%   Diagnostic PSG Report  Patient Name: [name] [name], [name] [name] Study Date: 01/16/2024  Date of Birth: June 15, 1950 Study Type: Diagnostic  Age: 16 year MRN #: [id number]  Sex: Female Interpreting Physician: NEYSA RAMA, [id number]  Height: 5' 2 Referring Physician: TODD MCDIARMID ([id number])  Weight: 158.0 lbs Recording Tech: Hargis Abu RPSGT RST  BMI: 29.1 Scoring Tech: Hargis Abu RPSGT RST  ESS: 7 Neck Size: 14   Study Overview  Lights Off: 10:26:39 PM  Count Index  Lights On: 04:38:54 AM Awakenings: 8 1.7  Time in Bed: 372.3 min. Arousals: 101 21.2  Total Sleep Time: 286.0 min. AHI (>=3% Desat and/or Ar.): 14 2.9   Sleep Efficiency: 76.8% AHI (>=4% Desat): 10 2.1   Sleep Latency: 69.6 min. Limb Movements:  19 4.0  Wake After Sleep Onset: 17.0 min. Snore: - -  REM Latency from Sleep Onset: - min. Desaturations: 7 1.5     Minimum SpO2 TST: 89.0%    Sleep Architecture  % of Time in Bed Stages Time (mins) % Sleep Time  Wake 87.0   Stage N1 10.0 3.5%  Stage N2 216.0 75.5%  Stage N3 60.0 21.0%  REM 0.0 0.0%    Arousal Summary   NREM REM Sleep Index  Respiratory Arousals 18 - 18 3.8  PLM Arousals 5 - 5 1.0  Isolated Limb Movement Arousals 4 - 4 0.8  Snore Arousals - - - -  Spontaneous Arousals 75 - 75 15.7  Total 101 - 101 21.2   Limb Movement Summary   Count Index  Isolated Limb Movements 6 1.3  Periodic Limb Movements (PLMs) 13 2.7  Total Limb Movements 19 4.0    Respiratory Summary   By Sleep Stage By Body Position Total   NREM REM Supine Non-Supine   Time (min) 286.0 0.0 39.5 246.5 286.0         Obstructive Apnea 10 - 3 7 10   Mixed Apnea - - - - -  Central Apnea - - - - -  Total Apneas 10 - 3 7 10   Total Apnea Index 2.1 - 4.6 1.7 2.1         Hypopneas (>=3% Desat and/or Ar.) 4 - 4 - 4  AHI (>=3% Desat and/or Ar.) 2.9 - 10.6 1.7 2.9         Hypopneas (>=4% Desat) - - - - -  AHI (>=4% Desat) 2.1 - 4.6 1.7 2.1          RERAs 16 - - 16 16  RERA Index 3.4 - - 3.9 3.4         RDI 6.3 - 10.6 5.6 6.3    Respiratory Event Type Index  Central Apneas -  Obstructive Apneas 2.1  Mixed Apneas -  Central Hypopneas -  Obstructive Hypopneas -  Central Apnea + Hypopnea (CAHI) -  Obstructive Apnea + Hypopnea (OAHI) 3.1   Respiratory Event Durations   Apnea Hypopnea   NREM REM NREM REM  Average (seconds) 18.5 - 26.7 -  Maximum (seconds) 29.9 - 39.2 -    Oxygen Saturation Summary   Wake NREM REM TST TIB  Average SpO2 (%) 93.1% 92.4% - 92.4% 92.6%  Minimum SpO2 (%) 89.0% 89.0% - 89.0% 89.0%  Maximum SpO2 (%) 97.0% 95.0% - 95.0% 97.0%   Oxygen Saturation Distribution  Range (%) Time in range (min) Time in range (%)  90.0 - 100.0 368.9 99.0%  80.0 - 90.0 3.6 1.0%  70.0 - 80.0 - -  60.0 - 70.0 - -  50.0 - 60.0 - -  0.0 - 50.0 - -  Time Spent <=88% SpO2  Range (%) Time in range (min) Time in range (%)  0.0 - 88.0 - -      Count Index  Desaturations 7 1.5    Cardiac Summary   Wake NREM REM Sleep Total  Average Pulse Rate (BPM) 70.3 69.9 - 69.9 70.0   Minimum Pulse Rate (BPM) 63.0 64.0 - 64.0 63.0  Maximum Pulse Rate (BPM) 90.0 88.0 - 88.0 90.0   Pulse Rate Distribution:  Range (bpm) Time in range (min) Time in range (%)  0.0 - 40.0 - -  40.0 - 60.0 - -  60.0 - 80.0 370.9 99.4%  80.0 - 100.0 2.1 0.6%  100.0 - 120.0 - -  120.0 - 140.0 - -  140.0 - 200.0 - -      Hypnograms                      Technologist Comments  Patient was at Sleep Lab for OSA. A PSG Study was ordered. Respiratory events were noted.  Snoring range from mild to severe.  Periodic Limb Movement was noted with/without arousals.  EKG showed NSR. Patient took her medication at 9 pm. No oxygen was applied.                            Reggy Salt Diplomate, Biomedical Engineer of Sleep Medicine  ELECTRONICALLY SIGNED ON:  01/24/2024, 1:52 PM [facility] PH: [phone]   FX: [phone] ACCREDITED BY THE AMERICAN ACADEMY OF SLEEP MEDICINE

## 2024-01-25 ENCOUNTER — Other Ambulatory Visit: Payer: Self-pay

## 2024-01-25 DIAGNOSIS — I10 Essential (primary) hypertension: Secondary | ICD-10-CM

## 2024-01-25 MED ORDER — VALSARTAN 40 MG PO TABS: 40.0000 mg | ORAL_TABLET | Freq: Every day | ORAL | 3 refills | Status: DC

## 2024-01-25 NOTE — Telephone Encounter (Signed)
Faxed refills to novo nordisk.

## 2024-01-26 ENCOUNTER — Ambulatory Visit
Admission: RE | Admit: 2024-01-26 | Discharge: 2024-01-26 | Disposition: A | Source: Ambulatory Visit | Attending: Family Medicine | Admitting: Family Medicine

## 2024-01-26 DIAGNOSIS — G8929 Other chronic pain: Secondary | ICD-10-CM

## 2024-01-27 ENCOUNTER — Telehealth: Payer: Self-pay

## 2024-01-27 NOTE — Telephone Encounter (Signed)
 Called patient today to discuss her sleep study result which no indication for OSA.   During call patient c/o elevated BP in the last 2 days with mild chest pain. But improved w/ rechecked. Denies HA, SOB, Palpitation. Advice patient to visit ED if she has chest pain. Advised patient to recheck BP at the same time by the same BP machine daily  She may need her hypertensive medication dosage to be increase if remain w/ elevated BP  I plan on reach out to check on her again tomorrow.

## 2024-01-28 ENCOUNTER — Telehealth: Payer: Self-pay

## 2024-01-28 DIAGNOSIS — I10 Essential (primary) hypertension: Secondary | ICD-10-CM

## 2024-01-28 MED ORDER — VALSARTAN 40 MG PO TABS: 80.0000 mg | ORAL_TABLET | Freq: Every day | ORAL | 0 refills | Status: AC

## 2024-01-28 NOTE — Telephone Encounter (Signed)
 NO 2026 RENEAL FOR OZEMPIC 

## 2024-01-28 NOTE — Telephone Encounter (Signed)
 Called patient this morning to follow up with her elevated BP from the last few days. Patient states her BP this morning was 184/85. Instructed patient to increase Losartan dosage from 40 mg daily to 80 mg daily. Patient was instructed to visit ED if she develop HA, SOB, chest pain, or become unstable. Patient will call clinic on Monday to report her BP. I have sent in the new Losartan prescription to her pharmacy.  Dr. McDiarmid attending physician agreed with plan.   Marshall Kampf, DO  PGY 1 Family Medicine Resident

## 2024-01-31 NOTE — Telephone Encounter (Signed)
 Called patient.   Reports that last reading, this AM was 128/70.   Yesterday readings were: 168/72, 159/84.   Saturday readings: Left arm: 141/72, right arm: 128/74.   She does report slight swelling in ankles later in the evening, otherwise asymptomatic.   Will forward to PCP.   Chiquita JAYSON English, RN

## 2024-02-07 ENCOUNTER — Ambulatory Visit (INDEPENDENT_AMBULATORY_CARE_PROVIDER_SITE_OTHER): Admitting: Family Medicine

## 2024-02-07 VITALS — BP 166/70 | Ht 62.0 in | Wt 160.0 lb

## 2024-02-07 DIAGNOSIS — G8929 Other chronic pain: Secondary | ICD-10-CM

## 2024-02-07 DIAGNOSIS — M25512 Pain in left shoulder: Secondary | ICD-10-CM

## 2024-02-07 NOTE — Progress Notes (Signed)
 MRI reviewed and discussed with patient.  She has subscapularis tendinopathy, subcoracoid bursitis, a labral tear with cyst, other mild findings.  No clear early evidence of adhesive capsulitis but also no evidence rotator cuff tear beyond possible small partial-thickness tearing of subscapularis.    She has improved though is being slow and deliberate with her motions.  External rotation now equal to left shoulder.  Has trouble reaching across her body.  Strength 5/5 rotator cuff except 5-/5 empty can with some pain.  Positive hawkins and neers today.  More consistent with an impingement picture.  She had completed prednisone which helped.  Discussed options - she will do home exercise program with yellow (light) theraband.  Topical voltaren  gel, tylenol .  Subacromial injection didn't provide much benefit 2 months ago - can consider repeating this vs glenohumeral injection if she's not improving.  Will hold off on formal physical therapy for now.  Follow up in 1 month to 6 weeks.

## 2024-02-08 NOTE — Telephone Encounter (Signed)
 Rec'd 3 boxes of Ozempic  1mg  dose pens.   2026 Novo Nordisk changes paperwork placed in patients bag.  Medicare patients will no longer be eligible for Ozempic  in 2026.

## 2024-02-09 NOTE — Telephone Encounter (Signed)
 Patient informed her shipment is ready for pickup. Also aware of 2026 changes.

## 2024-02-12 ENCOUNTER — Other Ambulatory Visit: Payer: Self-pay

## 2024-02-15 ENCOUNTER — Ambulatory Visit (HOSPITAL_BASED_OUTPATIENT_CLINIC_OR_DEPARTMENT_OTHER)
Admission: RE | Admit: 2024-02-15 | Discharge: 2024-02-15 | Disposition: A | Discharge status: Home / Self Care | Source: Ambulatory Visit | Attending: Physician Assistant | Admitting: Physician Assistant

## 2024-02-15 ENCOUNTER — Ambulatory Visit (HOSPITAL_COMMUNITY)
Admission: RE | Admit: 2024-02-15 | Discharge: 2024-02-15 | Disposition: A | Discharge status: Home / Self Care | Source: Ambulatory Visit | Attending: Physician Assistant | Admitting: Physician Assistant

## 2024-02-15 ENCOUNTER — Encounter (HOSPITAL_COMMUNITY): Payer: Self-pay

## 2024-02-15 DIAGNOSIS — I4719 Other supraventricular tachycardia: Secondary | ICD-10-CM | POA: Insufficient documentation

## 2024-02-15 DIAGNOSIS — I4729 Other ventricular tachycardia: Secondary | ICD-10-CM | POA: Insufficient documentation

## 2024-02-15 DIAGNOSIS — I2510 Atherosclerotic heart disease of native coronary artery without angina pectoris: Secondary | ICD-10-CM | POA: Diagnosis present

## 2024-02-15 DIAGNOSIS — I739 Peripheral vascular disease, unspecified: Secondary | ICD-10-CM | POA: Insufficient documentation

## 2024-02-15 DIAGNOSIS — I10 Essential (primary) hypertension: Secondary | ICD-10-CM | POA: Diagnosis present

## 2024-02-15 LAB — ECHOCARDIOGRAM COMPLETE
AR max vel: 1.4 cm2
AV Area VTI: 1.37 cm2
AV Area mean vel: 1.32 cm2
AV Mean grad: 4 mmHg
AV Peak grad: 8.2 mmHg
Ao pk vel: 1.43 m/s
Area-P 1/2: 3.42 cm2
S' Lateral: 2.85 cm

## 2024-02-15 NOTE — Telephone Encounter (Signed)
Shipment given to patient.

## 2024-02-16 ENCOUNTER — Ambulatory Visit: Payer: Self-pay | Admitting: Physician Assistant

## 2024-02-16 ENCOUNTER — Encounter: Payer: Self-pay | Admitting: Physician Assistant

## 2024-02-16 DIAGNOSIS — I739 Peripheral vascular disease, unspecified: Secondary | ICD-10-CM | POA: Insufficient documentation

## 2024-02-16 DIAGNOSIS — I4729 Other ventricular tachycardia: Secondary | ICD-10-CM

## 2024-02-18 LAB — LIPID PANEL
Chol/HDL Ratio: 3.7 ratio (ref 0.0–4.4)
Cholesterol, Total: 228 mg/dL — ABNORMAL HIGH (ref 100–199)
HDL: 61 mg/dL (ref >39)
LDL Chol Calc (NIH): 148 mg/dL — ABNORMAL HIGH (ref 0–99)
Triglycerides: 108 mg/dL (ref 0–149)
VLDL Cholesterol Cal: 19 mg/dL (ref 5–40)

## 2024-02-18 LAB — HEPATIC FUNCTION PANEL
ALT: 10 IU/L (ref 0–32)
AST: 12 IU/L (ref 0–40)
Albumin: 4.2 g/dL (ref 3.8–4.8)
Alkaline Phosphatase: 67 IU/L (ref 49–135)
Bilirubin Total: 0.8 mg/dL (ref 0.0–1.2)
Bilirubin, Direct: 0.21 mg/dL (ref 0.00–0.40)
Total Protein: 6.6 g/dL (ref 6.0–8.5)

## 2024-02-18 NOTE — Addendum Note (Signed)
 Addended by: LORING ANDRIETTE HERO on: 02/18/2024 09:39 AM   Modules accepted: Orders

## 2024-02-21 ENCOUNTER — Encounter: Payer: Self-pay | Admitting: *Deleted

## 2024-02-21 NOTE — Progress Notes (Signed)
Patient has been notified directly; all questions, if any, were answered. Patient voiced understanding.   

## 2024-02-23 ENCOUNTER — Ambulatory Visit: Attending: Internal Medicine | Admitting: Pharmacist Clinician (PhC)/ Clinical Pharmacy Specialist

## 2024-02-23 ENCOUNTER — Encounter: Payer: Self-pay | Admitting: Pharmacist Clinician (PhC)/ Clinical Pharmacy Specialist

## 2024-02-23 ENCOUNTER — Telehealth: Payer: Self-pay | Admitting: Pharmacist Clinician (PhC)/ Clinical Pharmacy Specialist

## 2024-02-23 DIAGNOSIS — E7800 Pure hypercholesterolemia, unspecified: Secondary | ICD-10-CM | POA: Diagnosis not present

## 2024-02-23 MED ORDER — AMLODIPINE BESYLATE 5 MG PO TABS: 5.0000 mg | ORAL_TABLET | Freq: Every day | ORAL | 3 refills | Status: AC

## 2024-02-23 NOTE — Telephone Encounter (Signed)
 Please do PA for Repatha

## 2024-02-23 NOTE — Assessment & Plan Note (Signed)
 Assessment: Patient with ASCVD not at LDL goal of < 55 Most recent LDL 148 on 02/17/2024 Has been compliant with ezetimibe  10 mg daily Not able to tolerate statins secondary to myalgias Reviewed options for lowering LDL cholesterol, including ezetimibe , PCSK-9 inhibitors, bempedoic acid and inclisiran.  Discussed mechanisms of action, dosing, side effects, potential decreases in LDL cholesterol and costs.  Also reviewed potential options for patient assistance.  Plan: Patient agreeable to starting Repatha Repeat labs after:  3 months Lipid Liver function

## 2024-02-23 NOTE — Patient Instructions (Addendum)
 Your Results:             Your most recent labs Goal  Total Cholesterol 228 < 200  Triglycerides 108 < 150  HDL (happy/good cholesterol) 61 > 40  LDL (lousy/bad cholesterol 148 < 55   Medication changes:  We will start the process to get Repatha covered by your insurance.  Once the prior authorization is complete, I will call/send a MyChart message to let you know and confirm pharmacy information.   You will take one injection every 14 days  Lab orders:  We want to repeat labs after 2-3 months.  We will send you a lab order to remind you once we get closer to that time.     Thank you for choosing [facility]

## 2024-02-23 NOTE — Progress Notes (Signed)
 Office Visit    Patient Name: [name] [name] Date of Encounter: 02/23/2024  Primary Care Provider:  Suzen Houston NOVAK, DO Primary Cardiologist:  Redell Shallow, MD  Chief Complaint    Hyperlipidemia   Significant Past Medical History   CAD 2018 DES to ostial ramus; carotid 1-39% bilateral  HTN/OH Controlled with amlodipine , metoprolol , valsartan   DM2 On Ozempic  1 mg     No Known Allergies  History of Present Illness    [name] [name] is a 73 y.o. female patient of Dr Shallow, in the office today to discuss options for cholesterol management.  She was most recently seen by Glendia Ferrier PA, in September.  Lookking at her lab history, LDL has been as high as 181, but appeared to come down to 60-80 range on medication.  She has not been able to tolerate the atorvastatin  and recently stopped taking it altogether  Insurance Carrier: [facility] Medicare/Medicaid  Pharmacy: [facility] [address]      LDL Cholesterol goal:  LDL <55  Current Medications:   atorvastatin  80 mg daily, ezetimibe  10 mg daily  Previously tried: simvastatin, atorvastatin    Family Hx:   mother had chf, died at 48; father died 25-30 years ago, had htn; siblings deceased but no heart disease; 3 kids, 1 with hypertension, 1 with high cholesterol  Social Hx: Tobacco: no Alcohol: moderate, few times per week  Diet:    more out than home cooked lately, including fast foods; when at home variety of protein, lots of vegetables, fresh mostly, occasional canned; some snacking; no breakfast, usually just lunch and dinner, some days only 1 meal  Exercise: no   Accessory Clinical Findings   Lab Results  Component Value Date   CHOL 228 (H) 02/17/2024   HDL 61 02/17/2024   LDLCALC 148 (H) 02/17/2024   LDLDIRECT 129 (H) 01/01/2010   TRIG 108 02/17/2024   CHOLHDL 3.7 02/17/2024    No results found for: LIPOA  Lab Results  Component Value Date   ALT 10 02/17/2024   AST 12 02/17/2024   ALKPHOS 67  02/17/2024   BILITOT 0.8 02/17/2024   Lab Results  Component Value Date   CREATININE 0.92 11/26/2023   BUN 11 11/26/2023   NA 138 11/26/2023   K 4.0 11/26/2023   CL 103 11/26/2023   CO2 19 (L) 11/26/2023   Lab Results  Component Value Date   HGBA1C 5.6 01/20/2024    Home Medications    Current Outpatient Medications  Medication Sig Dispense Refill   amLODipine  (NORVASC ) 5 MG tablet Take 1 tablet (5 mg total) by mouth daily. 90 tablet 3   ammonium lactate  (LAC-HYDRIN ) 12 % lotion APPLY 1 APPLICATION TOPICALLY AS NEEDED FOR DRY SKIN. 400 mL 1   aspirin  81 MG tablet Take 81 mg by mouth daily.     atorvastatin  (LIPITOR) 80 MG tablet Take 1 tablet (80 mg total) by mouth daily. 90 tablet 3   cycloSPORINE (RESTASIS) 0.05 % ophthalmic emulsion 1 drop 2 (two) times daily. Uses PRN     diclofenac  Sodium (VOLTAREN ) 1 % GEL Apply 2 g topically 4 (four) times daily as needed (pain). 350 g 2   ezetimibe  (ZETIA ) 10 MG tablet Take 1 tablet (10 mg total) by mouth daily. 90 tablet 3   fluticasone  (FLONASE ) 50 MCG/ACT nasal spray Place 2 sprays into both nostrils daily. 16 g 4   gabapentin  (NEURONTIN ) 100 MG capsule PLEASE SEE ATTACHED FOR DETAILED DIRECTIONS (Patient not taking: Reported  on 01/20/2024) 180 capsule 3   HYDROcodone -acetaminophen  (NORCO/VICODIN) 5-325 MG tablet Take 1 tablet by mouth every 6 (six) hours as needed for moderate pain (pain score 4-6). 20 tablet 0   hydrOXYzine  (ATARAX ) 10 MG tablet Take 1 tablet (10 mg total) by mouth 3 (three) times daily as needed. (Patient not taking: Reported on 12/21/2023) 60 tablet 1   melatonin 3 MG TABS tablet Take 1 tablet (3 mg total) by mouth at bedtime. (Patient not taking: Reported on 01/20/2024) 30 tablet 11   metoprolol  succinate (TOPROL -XL) 100 MG 24 hr tablet Take 1 tablet (100 mg total) by mouth daily. Take with or immediately following a meal. 90 tablet 3   nitroGLYCERIN  (NITROSTAT ) 0.4 MG SL tablet Place 1 tablet (0.4 mg total) under the  tongue every 5 (five) minutes as needed for chest pain. DISSOLVE 1 TABLET UNDER THE TONGUE EVERY 5 MINUTES AS NEEDED FOR CHEST PAIN. DO NOT EXCEED A TOTAL OF 3 DOSES IN 15 MINUTES. (ORIG) 25 tablet 3   ondansetron  (ZOFRAN ) 4 MG tablet Take 1 tablet (4 mg total) by mouth every 8 (eight) hours as needed for nausea or vomiting. 4 tablet 0   pantoprazole  (PROTONIX ) 40 MG tablet Take 1 tablet (40 mg total) by mouth 2 (two) times daily. 180 tablet 3   Polyethyl Glycol-Propyl Glycol (SYSTANE) 0.4-0.3 % GEL ophthalmic gel Place 1 Application into both eyes.     polyethylene glycol powder (GLYCOLAX /MIRALAX ) 17 GM/SCOOP powder Take 17 g by mouth 2 (two) times daily as needed. 3350 g 1   predniSONE  (DELTASONE ) 10 MG tablet 6 tabs po day 1, 5 tabs po day 2, 4 tabs po day 3, 3 tabs po day 4, 2 tabs po day 5, 1 tab po day 6 21 tablet 0   PREMARIN  vaginal cream PLACE 1 APPLICATORFUL VAGINALLY DAILY. 30 g 12   Semaglutide , 1 MG/DOSE, 4 MG/3ML SOPN Inject 1 mg into the skin once a week. 3 mL 2   sertraline  (ZOLOFT ) 100 MG tablet Take 1 tablet (100 mg total) by mouth daily. 90 tablet 1   sertraline  (ZOLOFT ) 50 MG tablet Take 1 tablet (50 mg total) by mouth daily. 90 tablet 1   terbinafine  (LAMISIL ) 250 MG tablet Take 1 tablet (250 mg total) by mouth daily. 30 tablet 0   tiZANidine  (ZANAFLEX ) 4 MG tablet Take 1 tablet (4 mg total) by mouth every 6 (six) hours as needed for muscle spasms. 30 tablet 0   traZODone  (DESYREL ) 50 MG tablet Take 1 tablet (50 mg total) by mouth at bedtime as needed. for sleep 90 tablet 6   valsartan  (DIOVAN ) 40 MG tablet Take 2 tablets (80 mg total) by mouth daily. 60 tablet 0   No current facility-administered medications for this visit.     Assessment & Plan    HYPERCHOLESTEROLEMIA Assessment: Patient with ASCVD not at LDL goal of < 55 Most recent LDL 148 on 02/17/2024 Has been compliant with ezetimibe  10 mg daily Not able to tolerate statins secondary to myalgias Reviewed options  for lowering LDL cholesterol, including ezetimibe , PCSK-9 inhibitors, bempedoic acid and inclisiran.  Discussed mechanisms of action, dosing, side effects, potential decreases in LDL cholesterol and costs.  Also reviewed potential options for patient assistance.  Plan: Patient agreeable to starting Repatha Repeat labs after:  3 months Lipid Liver function    Allean Mink, PharmD CPP [facility] [address]   [place], [place] [address] [phone]  02/23/2024, 2:30 PM

## 2024-02-25 ENCOUNTER — Other Ambulatory Visit (HOSPITAL_COMMUNITY): Payer: Self-pay

## 2024-02-25 ENCOUNTER — Telehealth: Payer: Self-pay | Admitting: Pharmacy Technician

## 2024-02-25 NOTE — Telephone Encounter (Signed)
 Pharmacy Patient Advocate Encounter  Received notification from [facility] that Prior Authorization for repatha has been APPROVED from 02/25/24 to 08/24/24. Ran test claim, Copay is $0.00. This test claim was processed through [facility]- copay amounts may vary at other pharmacies due to pharmacy/plan contracts, or as the patient moves through the different stages of their insurance plan.   PA #/Case ID/Reference #: [id number]

## 2024-02-25 NOTE — Telephone Encounter (Signed)
   Pharmacy Patient Advocate Encounter   Received notification from Pt Calls Messages that prior authorization for REPATHA is required/requested.   Insurance verification completed.   The patient is insured through [employer].   Per test claim: PA required; PA submitted to above mentioned insurance via Latent Key/confirmation #/EOC [id number] Status is pending

## 2024-02-27 ENCOUNTER — Encounter: Payer: Self-pay | Admitting: Pharmacist Clinician (PhC)/ Clinical Pharmacy Specialist

## 2024-02-27 DIAGNOSIS — E7800 Pure hypercholesterolemia, unspecified: Secondary | ICD-10-CM

## 2024-02-27 MED ORDER — REPATHA SURECLICK 140 MG/ML ~~LOC~~ SOAJ: 140.0000 mg | SUBCUTANEOUS | 3 refills | Status: AC

## 2024-02-27 NOTE — Progress Notes (Unsigned)
 [facility]  PATIENT: [name] [name] DOB: [birth year]  REFERRING DOCTOR OR PCP:  Donald Lai, DO SOURCE: patient,   _________________________________   HISTORICAL  CHIEF COMPLAINT:  Chief Complaint  Patient presents with   New Patient (Initial Visit)    Rm10, niece present, internal referral for incidental saccular aneurysm found on CTA    HISTORY OF PRESENT ILLNESS:  I had the pleasure of seeing your patient, [name] [name], at [facility] for neurologic consultation regarding her cerebral aneurysm.  She is a 73 year old woman with hypertension, non-insulin-dependent type 2 diabetes mellitus, CAD (status post DES 2018), hyperlipidemia who presented to the emergency room on 11/26/2023 with a headache that developed a day earlier.  Initially she had mild associated neck pain but that resolved.  The headache was waxing and waning in severity but never resolved prompting the presentation at the emergency room.  She felt a little lightheaded at times but no significant vertigo or actual presyncope.  There was no numbness or weakness or other neurologic symptoms.  She did have a little tingling in her hip/leg on the left.    No change in gait.   She did not a history of migraine headaches.  Of note, she had Covid-19 at the time but had no fever.     She has vascular risks including HTN with variable.   She was a smoker quitting 18 years ago - only smoked 1/2 pack/day x 20 years.    CT scan of the head 11/26/2023 was normal for age.    CT angiogram 11/26/2023 shows a small 2 to 3 mm aneurysm at the bifurcation of the right middle cerebral artery.  It does not appear to be present on an MRI angiogram from 05/03/2008  REVIEW OF SYSTEMS: Constitutional: No fevers, chills, sweats, or change in appetite Eyes: No visual changes, double vision, eye pain Ear, nose and throat: No hearing loss, ear pain, nasal congestion, sore throat Cardiovascular: No  chest pain, palpitations Respiratory:  No shortness of breath at rest or with exertion.   No wheezes GastrointestinaI: No nausea, vomiting, diarrhea, abdominal pain, fecal incontinence Genitourinary:  No dysuria, urinary retention or frequency.  No nocturia. Musculoskeletal:  No neck pain, back pain Integumentary: No rash, pruritus, skin lesions Neurological: as above Psychiatric: No depression at this time.  No anxiety Endocrine: No palpitations, diaphoresis, change in appetite, change in weigh or increased thirst Hematologic/Lymphatic:  No anemia, purpura, petechiae. Allergic/Immunologic: No itchy/runny eyes, nasal congestion, recent allergic reactions, rashes  ALLERGIES: No Known Allergies  HOME MEDICATIONS:  Current Outpatient Medications:    amLODipine  (NORVASC ) 5 MG tablet, Take 1 tablet (5 mg total) by mouth daily., Disp: 90 tablet, Rfl: 3   ammonium lactate  (LAC-HYDRIN ) 12 % lotion, APPLY 1 APPLICATION TOPICALLY AS NEEDED FOR DRY SKIN., Disp: 400 mL, Rfl: 1   aspirin  81 MG tablet, Take 81 mg by mouth daily., Disp: , Rfl:    cycloSPORINE (RESTASIS) 0.05 % ophthalmic emulsion, 1 drop 2 (two) times daily. Uses PRN, Disp: , Rfl:    diclofenac  Sodium (VOLTAREN ) 1 % GEL, Apply 2 g topically 4 (four) times daily as needed (pain)., Disp: 350 g, Rfl: 2   Evolocumab (REPATHA SURECLICK) 140 MG/ML SOAJ, Inject 140 mg into the skin every 14 (fourteen) days., Disp: 6 mL, Rfl: 3   ezetimibe  (ZETIA ) 10 MG tablet, Take 1 tablet (10 mg total) by mouth daily., Disp: 90 tablet, Rfl: 3   fluticasone  (FLONASE ) 50 MCG/ACT nasal spray, Place  2 sprays into both nostrils daily., Disp: 16 g, Rfl: 4   gabapentin  (NEURONTIN ) 100 MG capsule, PLEASE SEE ATTACHED FOR DETAILED DIRECTIONS, Disp: 180 capsule, Rfl: 3   HYDROcodone -acetaminophen  (NORCO/VICODIN) 5-325 MG tablet, Take 1 tablet by mouth every 6 (six) hours as needed for moderate pain (pain score 4-6)., Disp: 20 tablet, Rfl: 0   melatonin 3 MG TABS  tablet, Take 1 tablet (3 mg total) by mouth at bedtime., Disp: 30 tablet, Rfl: 11   metoprolol  succinate (TOPROL -XL) 100 MG 24 hr tablet, Take 1 tablet (100 mg total) by mouth daily. Take with or immediately following a meal., Disp: 90 tablet, Rfl: 3   nitroGLYCERIN  (NITROSTAT ) 0.4 MG SL tablet, Place 1 tablet (0.4 mg total) under the tongue every 5 (five) minutes as needed for chest pain. DISSOLVE 1 TABLET UNDER THE TONGUE EVERY 5 MINUTES AS NEEDED FOR CHEST PAIN. DO NOT EXCEED A TOTAL OF 3 DOSES IN 15 MINUTES. (ORIG), Disp: 25 tablet, Rfl: 3   ondansetron  (ZOFRAN ) 4 MG tablet, Take 1 tablet (4 mg total) by mouth every 8 (eight) hours as needed for nausea or vomiting., Disp: 4 tablet, Rfl: 0   pantoprazole  (PROTONIX ) 40 MG tablet, Take 1 tablet (40 mg total) by mouth 2 (two) times daily., Disp: 180 tablet, Rfl: 3   Polyethyl Glycol-Propyl Glycol (SYSTANE) 0.4-0.3 % GEL ophthalmic gel, Place 1 Application into both eyes., Disp: , Rfl:    polyethylene glycol powder (GLYCOLAX /MIRALAX ) 17 GM/SCOOP powder, Take 17 g by mouth 2 (two) times daily as needed., Disp: 3350 g, Rfl: 1   predniSONE  (DELTASONE ) 10 MG tablet, 6 tabs po day 1, 5 tabs po day 2, 4 tabs po day 3, 3 tabs po day 4, 2 tabs po day 5, 1 tab po day 6, Disp: 21 tablet, Rfl: 0   PREMARIN  vaginal cream, PLACE 1 APPLICATORFUL VAGINALLY DAILY., Disp: 30 g, Rfl: 12   Semaglutide , 1 MG/DOSE, 4 MG/3ML SOPN, Inject 1 mg into the skin once a week., Disp: 3 mL, Rfl: 2   sertraline  (ZOLOFT ) 100 MG tablet, Take 1 tablet (100 mg total) by mouth daily., Disp: 90 tablet, Rfl: 1   sertraline  (ZOLOFT ) 50 MG tablet, Take 1 tablet (50 mg total) by mouth daily., Disp: 90 tablet, Rfl: 1   terbinafine  (LAMISIL ) 250 MG tablet, Take 1 tablet (250 mg total) by mouth daily., Disp: 30 tablet, Rfl: 0   tiZANidine  (ZANAFLEX ) 4 MG tablet, Take 1 tablet (4 mg total) by mouth every 6 (six) hours as needed for muscle spasms., Disp: 30 tablet, Rfl: 0   traZODone  (DESYREL ) 50 MG  tablet, Take 1 tablet (50 mg total) by mouth at bedtime as needed. for sleep, Disp: 90 tablet, Rfl: 6   valsartan  (DIOVAN ) 40 MG tablet, Take 2 tablets (80 mg total) by mouth daily., Disp: 60 tablet, Rfl: 0  PAST MEDICAL HISTORY: Past Medical History:  Diagnosis Date   Arthritis    Blood transfusion without reported diagnosis    Cataract    Claudication 02/16/2024   ABIs 02/15/2024: Normal bilaterally     Coronary artery disease    Depression    Hyperlipidemia    Hypertension    Microcytic anemia    Mitral valve prolapse     PAST SURGICAL HISTORY: Past Surgical History:  Procedure Laterality Date   ABDOMINAL HYSTERECTOMY     CARDIAC CATHETERIZATION     CATARACT EXTRACTION, BILATERAL     COLONOSCOPY     greater than 10 years ago  CORONARY PRESSURE/FFR STUDY N/A 03/01/2017   Procedure: INTRAVASCULAR PRESSURE WIRE/FFR STUDY;  Surgeon: Verlin Lonni BIRCH, MD;  Location: [facility];  Service: Cardiovascular;  Laterality: N/A;   CORONARY STENT INTERVENTION N/A 03/01/2017   Procedure: CORONARY STENT INTERVENTION;  Surgeon: Verlin Lonni BIRCH, MD;  Location: [facility];  Service: Cardiovascular;  Laterality: N/A;   LEFT HEART CATH AND CORONARY ANGIOGRAPHY N/A 03/01/2017   Procedure: LEFT HEART CATH AND CORONARY ANGIOGRAPHY;  Surgeon: Verlin Lonni BIRCH, MD;  Location: [facility];  Service: Cardiovascular;  Laterality: N/A;   LEFT HEART CATH AND CORONARY ANGIOGRAPHY N/A 08/16/2023   Procedure: LEFT HEART CATH AND CORONARY ANGIOGRAPHY;  Surgeon: Ladona Heinz, MD;  Location: [facility];  Service: Cardiovascular;  Laterality: N/A;   PARTIAL HYSTERECTOMY      FAMILY HISTORY: Family History  Problem Relation Age of Onset   Heart disease Mother        CHF   Diabetes Mother    Hypertension Mother    Cancer Father    Hypertension Sister    Pancreatitis Sister    Dementia Sister    Esophageal cancer Sister    Esophageal cancer Brother     Colon cancer Neg Hx    Stomach cancer Neg Hx    Rectal cancer Neg Hx     SOCIAL HISTORY: Social History   Socioeconomic History   Marital status: Married    Spouse name: Adam   Number of children: 3   Years of education: 12   Highest education level: High school graduate  Occupational History    Comment: Retired  Tobacco Use   Smoking status: Former    Current packs/day: 0.00    Average packs/day: 0.5 packs/day for 20.0 years (10.0 ttl pk-yrs)    Types: Cigarettes    Start date: 07/30/1984    Quit date: 07/30/2004    Years since quitting: 19.6   Smokeless tobacco: Never  Vaping Use   Vaping status: Never Used  Substance and Sexual Activity   Alcohol use: Yes    Alcohol/week: 0.0 standard drinks of alcohol    Comment: Occasional   Drug use: No   Sexual activity: Yes    Partners: Male    Birth control/protection: Post-menopausal  Other Topics Concern   Not on file  Social History Narrative   Patient lives in [place] with her husband and niece.   Patient has 3 daughters she is close with. One lives right next door.    Patient enjoys sewing, cooking, reading, and outdoor activities.    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    Social Drivers of Corporate Investment Banker Strain: Low Risk  (04/21/2019)   Overall Financial Resource Strain (CARDIA)    Difficulty of Paying Living Expenses: Not hard at all  Food Insecurity: No Food Insecurity (04/21/2019)   Hunger Vital Sign    Worried About Running Out of Food in the Last Year: Never true    Ran Out of Food in the Last Year: Never true  Transportation Needs: No Transportation Needs (04/21/2019)   PRAPARE - Administrator, Civil Service (Medical): No    Lack of Transportation (Non-Medical): No  Physical Activity: Sufficiently Active (04/21/2019)   Exercise Vital Sign    Days of Exercise per Week: 3 days    Minutes of Exercise per Session: 60 min  Stress: No Stress Concern  Present (04/21/2019)  [employer] of Occupational Health - Occupational Stress Questionnaire    Feeling of Stress : Only a little  Social Connections: Socially Integrated (04/21/2019)   Social Connection and Isolation Panel    Frequency of Communication with Friends and Family: More than three times a week    Frequency of Social Gatherings with Friends and Family: More than three times a week    Attends Religious Services: More than 4 times per year    Active Member of [employer] or Organizations: Yes    Attends Banker Meetings: More than 4 times per year    Marital Status: Married  Catering Manager Violence: Not At Risk (04/21/2019)   Humiliation, Afraid, Rape, and Kick questionnaire    Fear of Current or Ex-Partner: No    Emotionally Abused: No    Physically Abused: No    Sexually Abused: No       PHYSICAL EXAM  Vitals:   03/01/24 1403 03/01/24 1409  BP: (!) 155/76 (!) 156/75  Pulse: 73   Resp: 15   SpO2: (!) 66%   Height: 5' 2 (1.575 m)     Body mass index is 29.26 kg/m.   General: The patient is well-developed and well-nourished and in no acute distress  HEENT:  Head is [place]/AT.  Sclera are anicteric.    Neck: No carotid bruits are noted.  The neck is nontender.  Cardiovascular: The heart has a regular rate and rhythm with a normal S1 and S2. There were no murmurs, gallops or rubs.    Skin: Extremities are without rash or  edema.  Musculoskeletal:  Back is nontender  Neurologic Exam  Mental status: The patient is alert and oriented x 3 at the time of the examination. The patient has apparent normal recent and remote memory, with an apparently normal attention span and concentration ability.   Speech is normal.  Cranial nerves: Extraocular movements are full. Pupils are equal, round, and reactive to light and accomodation.  Visual fields are full.  Facial symmetry is present. There is good facial sensation to soft touch bilaterally.Facial strength  is normal.  Trapezius and sternocleidomastoid strength is normal. No dysarthria is noted.   No obvious hearing deficits are noted.  Motor:  Muscle bulk is normal.   Tone is normal. Strength is  5 / 5 in all 4 extremities.   Sensory: Sensory testing is intact to pinprick, soft touch and vibration sensation in all 4 extremities.  Coordination: Cerebellar testing reveals good finger-nose-finger and heel-to-shin bilaterally.  Gait and station: Station is normal.   Gait is normal. Tandem gait is normal. Romberg is negative.   Reflexes: Deep tendon reflexes are symmetric and normal bilaterally.       DIAGNOSTIC DATA (LABS, IMAGING, TESTING) - I reviewed patient records, labs, notes, testing and imaging myself where available.  Lab Results  Component Value Date   WBC 5.5 11/26/2023   HGB 12.9 11/26/2023   HCT 41.6 11/26/2023   MCV 72.6 (L) 11/26/2023   PLT 385 11/26/2023      Component Value Date/Time   NA 138 11/26/2023 1240   NA 141 08/13/2023 1504   K 4.0 11/26/2023 1240   CL 103 11/26/2023 1240   CO2 19 (L) 11/26/2023 1240   GLUCOSE 104 (H) 11/26/2023 1240   BUN 11 11/26/2023 1240   BUN 13 08/13/2023 1504   CREATININE 0.92 11/26/2023 1240   CREATININE 0.83 11/22/2015 1543   CALCIUM  9.5 11/26/2023 1240   PROT 6.6 02/17/2024 1238  ALBUMIN 4.2 02/17/2024 1238   AST 12 02/17/2024 1238   ALT 10 02/17/2024 1238   ALKPHOS 67 02/17/2024 1238   BILITOT 0.8 02/17/2024 1238   GFRNONAA >60 11/26/2023 1240   GFRNONAA 74 11/22/2015 1543   GFRAA 69 04/22/2020 1530   GFRAA 85 11/22/2015 1543   Lab Results  Component Value Date   CHOL 228 (H) 02/17/2024   HDL 61 02/17/2024   LDLCALC 148 (H) 02/17/2024   LDLDIRECT 129 (H) 01/01/2010   TRIG 108 02/17/2024   CHOLHDL 3.7 02/17/2024   Lab Results  Component Value Date   HGBA1C 5.6 01/20/2024   No results found for: VITAMINB12 Lab Results  Component Value Date   TSH 0.820 04/22/2020       ASSESSMENT AND  PLAN  Cerebral aneurysm  Essential hypertension  Coronary artery disease involving native coronary artery of native heart without angina pectoris  History of tobacco use  In summary, [name] [name] is a 73 year old woman who had imaging studies due to headaches.  The headaches were likely associated with a COVID-19 infection.  However, the CT angiogram showed an unruptured aneurysm at the bifurcation of the left middle cerebral artery.  It is saccular and 2 to 3 mm in diameter.  We discussed the importance of hypertension control.  She has no plans to resume smoking, having quit 18 years ago We will recheck a CT angiogram in about 6 months. She will have a follow-up to see me in about 6 months   [name] [name] A. Vear, MD, [facility] 03/01/2024, 5:31 PM Certified in Neurology, Clinical Neurophysiology, Sleep Medicine and Neuroimaging  [facility] [address], [address] [place], [place] [address] [phone]

## 2024-02-29 ENCOUNTER — Other Ambulatory Visit: Payer: Self-pay

## 2024-03-01 ENCOUNTER — Other Ambulatory Visit: Payer: Self-pay

## 2024-03-01 ENCOUNTER — Encounter: Payer: Self-pay | Admitting: Neurology

## 2024-03-01 ENCOUNTER — Ambulatory Visit: Admitting: Neurology

## 2024-03-01 VITALS — BP 156/75 | HR 73 | Resp 15 | Ht 62.0 in

## 2024-03-01 DIAGNOSIS — I10 Essential (primary) hypertension: Secondary | ICD-10-CM

## 2024-03-01 DIAGNOSIS — I671 Cerebral aneurysm, nonruptured: Secondary | ICD-10-CM | POA: Insufficient documentation

## 2024-03-01 DIAGNOSIS — I2510 Atherosclerotic heart disease of native coronary artery without angina pectoris: Secondary | ICD-10-CM | POA: Diagnosis not present

## 2024-03-01 DIAGNOSIS — Z87891 Personal history of nicotine dependence: Secondary | ICD-10-CM

## 2024-03-02 MED ORDER — SERTRALINE HCL 100 MG PO TABS: 100.0000 mg | ORAL_TABLET | Freq: Every day | ORAL | 3 refills | Status: AC

## 2024-03-16 ENCOUNTER — Ambulatory Visit (INDEPENDENT_AMBULATORY_CARE_PROVIDER_SITE_OTHER)

## 2024-03-16 VITALS — Ht 62.0 in | Wt 160.0 lb

## 2024-03-16 DIAGNOSIS — Z0000 Encounter for general adult medical examination without abnormal findings: Secondary | ICD-10-CM | POA: Diagnosis not present

## 2024-03-16 NOTE — Progress Notes (Signed)
 Chief Complaint  Patient presents with   Medicare Wellness    SUBSEQUENT     Subjective:   [name] [name] is a 73 y.o. female who presents for a Medicare Annual Wellness Visit.  Visit info / Clinical Intake: Medicare Wellness Visit Type:: Subsequent Annual Wellness Visit Persons participating in visit and providing information:: patient Medicare Wellness Visit Mode:: Telephone If telephone:: video declined Since this visit was completed virtually, some vitals may be partially provided or unavailable. Missing vitals are due to the limitations of the virtual format.: Documented vitals are patient reported If Telephone or Video please confirm:: I connected with patient using audio/video enable telemedicine. I verified patient identity with two identifiers, discussed telehealth limitations, and patient agreed to proceed. Patient Location:: HOME Provider Location:: OFFICE Interpreter Needed?: No Pre-visit prep was completed: yes AWV questionnaire completed by patient prior to visit?: no Living arrangements:: lives with spouse/significant other Patient's Overall Health Status Rating: good Typical amount of pain: none Does pain affect daily life?: no Are you currently prescribed opioids?: (!) yes  Dietary Habits and Nutritional Risks How many meals a day?: 2 Eats fruit and vegetables daily?: yes Most meals are obtained by: preparing own meals In the last 2 weeks, have you had any of the following?: none Diabetic:: no (PREDIABETIC)  Functional Status Activities of Daily Living (to include ambulation/medication): Independent Ambulation: Independent with device- listed below Home Assistive Devices/Equipment: Eyeglasses Medication Administration: Independent Home Management (perform basic housework or laundry): Independent Concerns about vision?: no *vision screening is required for WTM* Concerns about hearing?: no  Fall Screening Falls in the past year?: 0 Number of falls  in past year: 0 Was there an injury with Fall?: 0 Fall Risk Category Calculator: 0 Patient Fall Risk Level: Low Fall Risk  Fall Risk Patient at Risk for Falls Due to: No Fall Risks Fall risk Follow up: Falls evaluation completed; Education provided  Home and Transportation Safety: All rugs have non-skid backing?: N/A, no rugs All stairs or steps have railings?: N/A, no stairs Grab bars in the bathtub or shower?: (!) no Have non-skid surface in bathtub or shower?: (!) no Good home lighting?: yes Regular seat belt use?: yes Hospital stays in the last year:: no  Cognitive Assessment Difficulty concentrating, remembering, or making decisions? : no Will 6CIT or Mini Cog be Completed: yes 6CIT or Mini Cog Declined: patient alert, oriented, able to answer questions appropriately and recall recent events What year is it?: 0 points What month is it?: 0 points Give patient an address phrase to remember (5 components): [place]ie [place] [address] About what time is it?: 0 points Count backwards from 20 to 1: 0 points Say the months of the year in reverse: 0 points Repeat the address phrase from earlier: 0 points 6 CIT Score: 0 points  Advance Directives (For Healthcare) Does Patient Have a Medical Advance Directive?: No Would patient like information on creating a medical advance directive?: No - Patient declined (PENDING)  Reviewed/Updated  Reviewed/Updated: Reviewed All (Medical, Surgical, Family, Medications, Allergies, Care Teams, Patient Goals)    Allergies (verified) Patient has no known allergies.   Current Medications (verified) Outpatient Encounter Medications as of 03/16/2024  Medication Sig   amLODipine  (NORVASC ) 5 MG tablet Take 1 tablet (5 mg total) by mouth daily.   ammonium lactate  (LAC-HYDRIN ) 12 % lotion APPLY 1 APPLICATION TOPICALLY AS NEEDED FOR DRY SKIN.   aspirin  81 MG tablet Take 81 mg by mouth daily.   cycloSPORINE (RESTASIS) 0.05 %  ophthalmic emulsion 1  drop 2 (two) times daily. Uses PRN   diclofenac  Sodium (VOLTAREN ) 1 % GEL Apply 2 g topically 4 (four) times daily as needed (pain).   Evolocumab  (REPATHA  SURECLICK) 140 MG/ML SOAJ Inject 140 mg into the skin every 14 (fourteen) days.   ezetimibe  (ZETIA ) 10 MG tablet Take 1 tablet (10 mg total) by mouth daily.   fluticasone  (FLONASE ) 50 MCG/ACT nasal spray Place 2 sprays into both nostrils daily.   gabapentin  (NEURONTIN ) 100 MG capsule PLEASE SEE ATTACHED FOR DETAILED DIRECTIONS   HYDROcodone -acetaminophen  (NORCO/VICODIN) 5-325 MG tablet Take 1 tablet by mouth every 6 (six) hours as needed for moderate pain (pain score 4-6).   melatonin 3 MG TABS tablet Take 1 tablet (3 mg total) by mouth at bedtime.   metoprolol  succinate (TOPROL -XL) 100 MG 24 hr tablet Take 1 tablet (100 mg total) by mouth daily. Take with or immediately following a meal.   nitroGLYCERIN  (NITROSTAT ) 0.4 MG SL tablet Place 1 tablet (0.4 mg total) under the tongue every 5 (five) minutes as needed for chest pain. DISSOLVE 1 TABLET UNDER THE TONGUE EVERY 5 MINUTES AS NEEDED FOR CHEST PAIN. DO NOT EXCEED A TOTAL OF 3 DOSES IN 15 MINUTES. (ORIG)   ondansetron  (ZOFRAN ) 4 MG tablet Take 1 tablet (4 mg total) by mouth every 8 (eight) hours as needed for nausea or vomiting.   pantoprazole  (PROTONIX ) 40 MG tablet Take 1 tablet (40 mg total) by mouth 2 (two) times daily.   Polyethyl Glycol-Propyl Glycol (SYSTANE) 0.4-0.3 % GEL ophthalmic gel Place 1 Application into both eyes.   polyethylene glycol powder (GLYCOLAX /MIRALAX ) 17 GM/SCOOP powder Take 17 g by mouth 2 (two) times daily as needed.   predniSONE  (DELTASONE ) 10 MG tablet 6 tabs po day 1, 5 tabs po day 2, 4 tabs po day 3, 3 tabs po day 4, 2 tabs po day 5, 1 tab po day 6   PREMARIN  vaginal cream PLACE 1 APPLICATORFUL VAGINALLY DAILY.   Semaglutide , 1 MG/DOSE, 4 MG/3ML SOPN Inject 1 mg into the skin once a week.   sertraline  (ZOLOFT ) 100 MG tablet Take 1 tablet (100 mg total) by mouth  daily.   sertraline  (ZOLOFT ) 50 MG tablet Take 1 tablet (50 mg total) by mouth daily.   terbinafine  (LAMISIL ) 250 MG tablet Take 1 tablet (250 mg total) by mouth daily.   tiZANidine  (ZANAFLEX ) 4 MG tablet Take 1 tablet (4 mg total) by mouth every 6 (six) hours as needed for muscle spasms.   traZODone  (DESYREL ) 50 MG tablet Take 1 tablet (50 mg total) by mouth at bedtime as needed. for sleep   valsartan  (DIOVAN ) 40 MG tablet Take 2 tablets (80 mg total) by mouth daily.   No facility-administered encounter medications on file as of 03/16/2024.    History: Past Medical History:  Diagnosis Date   Arthritis    Blood transfusion without reported diagnosis    Cataract    Claudication 02/16/2024   ABIs 02/15/2024: Normal bilaterally     Coronary artery disease    Depression    Hyperlipidemia    Hypertension    Microcytic anemia    Mitral valve prolapse    Past Surgical History:  Procedure Laterality Date   ABDOMINAL HYSTERECTOMY     CARDIAC CATHETERIZATION     CATARACT EXTRACTION, BILATERAL     COLONOSCOPY     greater than 10 years ago   CORONARY PRESSURE/FFR STUDY N/A 03/01/2017   Procedure: INTRAVASCULAR PRESSURE WIRE/FFR STUDY;  Surgeon:  Verlin Lonni BIRCH, MD;  Location: [facility];  Service: Cardiovascular;  Laterality: N/A;   CORONARY STENT INTERVENTION N/A 03/01/2017   Procedure: CORONARY STENT INTERVENTION;  Surgeon: Verlin Lonni BIRCH, MD;  Location: [facility];  Service: Cardiovascular;  Laterality: N/A;   LEFT HEART CATH AND CORONARY ANGIOGRAPHY N/A 03/01/2017   Procedure: LEFT HEART CATH AND CORONARY ANGIOGRAPHY;  Surgeon: Verlin Lonni BIRCH, MD;  Location: [facility];  Service: Cardiovascular;  Laterality: N/A;   LEFT HEART CATH AND CORONARY ANGIOGRAPHY N/A 08/16/2023   Procedure: LEFT HEART CATH AND CORONARY ANGIOGRAPHY;  Surgeon: Ladona Heinz, MD;  Location: [facility];  Service: Cardiovascular;  Laterality: N/A;   PARTIAL  HYSTERECTOMY     Family History  Problem Relation Age of Onset   Heart disease Mother        CHF   Diabetes Mother    Hypertension Mother    Cancer Father    Hypertension Sister    Pancreatitis Sister    Dementia Sister    Esophageal cancer Sister    Esophageal cancer Brother    Colon cancer Neg Hx    Stomach cancer Neg Hx    Rectal cancer Neg Hx    Social History   Occupational History    Comment: Retired  Tobacco Use   Smoking status: Former    Current packs/day: 0.00    Average packs/day: 0.5 packs/day for 20.0 years (10.0 ttl pk-yrs)    Types: Cigarettes    Start date: 07/30/1984    Quit date: 07/30/2004    Years since quitting: 19.6   Smokeless tobacco: Never  Vaping Use   Vaping status: Never Used  Substance and Sexual Activity   Alcohol use: Yes    Alcohol/week: 0.0 standard drinks of alcohol    Comment: Occasional   Drug use: No   Sexual activity: Yes    Partners: Male    Birth control/protection: Post-menopausal   Tobacco Counseling Counseling given: Not Answered  SDOH Screenings   Food Insecurity: No Food Insecurity (03/16/2024)  Housing: Low Risk (03/16/2024)  Transportation Needs: No Transportation Needs (03/16/2024)  Utilities: Not At Risk (03/16/2024)  Alcohol Screen: Low Risk (03/16/2024)  Depression (PHQ2-9): Low Risk (03/16/2024)  Recent Concern: Depression (PHQ2-9) - Medium Risk (01/20/2024)  Financial Resource Strain: Low Risk (03/16/2024)  Physical Activity: Inactive (03/16/2024)  Social Connections: Socially Integrated (03/16/2024)  Stress: No Stress Concern Present (03/16/2024)  Tobacco Use: Medium Risk (03/16/2024)  Health Literacy: Adequate Health Literacy (03/16/2024)   See flowsheets for full screening details  Depression Screen PHQ 2 & 9 Depression Scale- Over the past 2 weeks, how often have you been bothered by any of the following problems? Little interest or pleasure in doing things: 1 Feeling down, depressed, or hopeless  (PHQ Adolescent also includes...irritable): 1 PHQ-2 Total Score: 2 Trouble falling or staying asleep, or sleeping too much: 1 Feeling tired or having little energy: 1 Poor appetite or overeating (PHQ Adolescent also includes...weight loss): 0 Feeling bad about yourself - or that you are a failure or have let yourself or your family down: 0 Trouble concentrating on things, such as reading the newspaper or watching television (PHQ Adolescent also includes...like school work): 0 Moving or speaking so slowly that other people could have noticed. Or the opposite - being so fidgety or restless that you have been moving around a lot more than usual: 0 Thoughts that you would be better off dead, or of hurting yourself in  some way: 0 PHQ-9 Total Score: 4 If you checked off any problems, how difficult have these problems made it for you to do your work, take care of things at home, or get along with other people?: Very difficult  Depression Treatment Depression Interventions/Treatment : EYV7-0 Score <4 Follow-up Not Indicated     Goals Addressed             This Visit's Progress    03/16/2024: To lose a few pounds and eat healthier.               Objective:    Today's Vitals   03/16/24 1154  Weight: 160 lb (72.6 kg)  Height: 5' 2 (1.575 m)  PainSc: 0-No pain   Body mass index is 29.26 kg/m.  Hearing/Vision screen Hearing Screening - Comments:: No hearing aids. Vision Screening - Comments:: Wears eyeglasses. Eye exam done by [facility] Immunizations and Health Maintenance Health Maintenance  Topic Date Due   Colonoscopy  03/21/2024 (Originally 07/10/2023)   OPHTHALMOLOGY EXAM  05/11/2024 (Originally 08/13/2023)   HEMOGLOBIN A1C  07/20/2024   COVID-19 Vaccine (7 - Pfizer risk 2025-26 season) 07/20/2024   Diabetic kidney evaluation - eGFR measurement  11/25/2024   Diabetic kidney evaluation - Urine ACR  01/19/2025   FOOT EXAM  01/19/2025   DTaP/Tdap/Td (3 - Td or Tdap)  01/27/2025   Medicare Annual Wellness (AWV)  03/16/2025   Mammogram  12/12/2025   Pneumococcal Vaccine: 50+ Years  Completed   Influenza Vaccine  Completed   Bone Density Scan  Completed   Hepatitis C Screening  Completed   Zoster Vaccines- Shingrix   Completed   Meningococcal B Vaccine  Aged Out        Assessment/Plan:  This is a routine wellness examination for [place].  Patient Care Team: Suzen Houston NOVAK, DO as PCP - General (Family Medicine) Pietro Redell RAMAN, MD as PCP - Cardiology (Cardiology) Rachell Zachary Lot, OD (Optometry) Pietro Redell RAMAN, MD as Consulting Physician (Cardiology)  I have personally reviewed and noted the following in the patients chart:   Medical and social history Use of alcohol, tobacco or illicit drugs  Current medications and supplements including opioid prescriptions. Functional ability and status Nutritional status Physical activity Advanced directives List of other physicians Hospitalizations, surgeries, and ER visits in previous 12 months Vitals Screenings to include cognitive, depression, and falls Referrals and appointments  No orders of the defined types were placed in this encounter.  In addition, I have reviewed and discussed with patient certain preventive protocols, quality metrics, and best practice recommendations. A written personalized care plan for preventive services as well as general preventive health recommendations were provided to patient.   Roz LOISE Fuller, LPN   [id number]   Return in 1 year (on 03/16/2025).  After Visit Summary: (MyChart) Due to this being a telephonic visit, the after visit summary with patients personalized plan was offered to patient via MyChart   Nurse Notes: Patient is aware of current care gaps.  Patient is due for Colonoscopy and her eye exam was completed by [facility].

## 2024-03-16 NOTE — Patient Instructions (Signed)
 [name] [name],  Thank you for taking the time for your Medicare Wellness Visit. I appreciate your continued commitment to your health goals. Please review the care plan we discussed, and feel free to reach out if I can assist you further.  Please note that Annual Wellness Visits do not include a physical exam. Some assessments may be limited, especially if the visit was conducted virtually. If needed, we may recommend an in-person follow-up with your provider.  Ongoing Care Seeing your primary care provider every 3 to 6 months helps us  monitor your health and provide consistent, personalized care.   Referrals If a referral was made during today's visit and you haven't received any updates within two weeks, please contact the referred provider directly to check on the status.  Recommended Screenings:  Health Maintenance  Topic Date Due   Medicare Annual Wellness Visit  04/20/2020   Colon Cancer Screening  03/21/2024*   Eye exam for diabetics  05/11/2024*   Hemoglobin A1C  07/20/2024   COVID-19 Vaccine (7 - Pfizer risk 2025-26 season) 07/20/2024   Yearly kidney function blood test for diabetes  11/25/2024   Yearly kidney health urinalysis for diabetes  01/19/2025   Complete foot exam   01/19/2025   DTaP/Tdap/Td vaccine (3 - Td or Tdap) 01/27/2025   Breast Cancer Screening  12/12/2025   Pneumococcal Vaccine for age over 56  Completed   Flu Shot  Completed   Osteoporosis screening with Bone Density Scan  Completed   Hepatitis C Screening  Completed   Zoster (Shingles) Vaccine  Completed   Meningitis B Vaccine  Aged Out  *Topic was postponed. The date shown is not the original due date.       03/16/2024   11:55 AM  Advanced Directives  Does Patient Have a Medical Advance Directive? No  Would patient like information on creating a medical advance directive? No - Patient declined    Vision: Annual vision screenings are recommended for early detection of glaucoma, cataracts, and  diabetic retinopathy. These exams can also reveal signs of chronic conditions such as diabetes and high blood pressure.  Dental: Annual dental screenings help detect early signs of oral cancer, gum disease, and other conditions linked to overall health, including heart disease and diabetes.  Please see the attached documents for additional preventive care recommendations.

## 2024-03-20 ENCOUNTER — Ambulatory Visit: Admitting: Family Medicine

## 2024-04-19 ENCOUNTER — Encounter: Payer: Self-pay | Admitting: Internal Medicine

## 2024-04-25 ENCOUNTER — Emergency Department (HOSPITAL_BASED_OUTPATIENT_CLINIC_OR_DEPARTMENT_OTHER)

## 2024-04-25 ENCOUNTER — Other Ambulatory Visit: Payer: Self-pay

## 2024-04-25 ENCOUNTER — Telehealth: Payer: Self-pay | Admitting: Cardiology

## 2024-04-25 ENCOUNTER — Emergency Department (HOSPITAL_BASED_OUTPATIENT_CLINIC_OR_DEPARTMENT_OTHER)
Admission: EM | Admit: 2024-04-25 | Discharge: 2024-04-25 | Disposition: A | Discharge status: Home / Self Care | Attending: Emergency Medicine | Admitting: Emergency Medicine

## 2024-04-25 ENCOUNTER — Encounter (HOSPITAL_BASED_OUTPATIENT_CLINIC_OR_DEPARTMENT_OTHER): Payer: Self-pay

## 2024-04-25 DIAGNOSIS — R600 Localized edema: Secondary | ICD-10-CM | POA: Insufficient documentation

## 2024-04-25 DIAGNOSIS — R072 Precordial pain: Secondary | ICD-10-CM | POA: Diagnosis not present

## 2024-04-25 DIAGNOSIS — R911 Solitary pulmonary nodule: Secondary | ICD-10-CM | POA: Insufficient documentation

## 2024-04-25 DIAGNOSIS — J90 Pleural effusion, not elsewhere classified: Secondary | ICD-10-CM | POA: Diagnosis not present

## 2024-04-25 DIAGNOSIS — R079 Chest pain, unspecified: Secondary | ICD-10-CM | POA: Diagnosis present

## 2024-04-25 DIAGNOSIS — D72829 Elevated white blood cell count, unspecified: Secondary | ICD-10-CM | POA: Diagnosis not present

## 2024-04-25 DIAGNOSIS — Z79899 Other long term (current) drug therapy: Secondary | ICD-10-CM | POA: Diagnosis not present

## 2024-04-25 DIAGNOSIS — I2510 Atherosclerotic heart disease of native coronary artery without angina pectoris: Secondary | ICD-10-CM | POA: Diagnosis not present

## 2024-04-25 DIAGNOSIS — Z7982 Long term (current) use of aspirin: Secondary | ICD-10-CM | POA: Diagnosis not present

## 2024-04-25 DIAGNOSIS — I10 Essential (primary) hypertension: Secondary | ICD-10-CM | POA: Diagnosis not present

## 2024-04-25 LAB — CBC
HCT: 40.2 % (ref 36.0–46.0)
Hemoglobin: 12.8 g/dL (ref 12.0–15.0)
MCH: 22.4 pg — ABNORMAL LOW (ref 26.0–34.0)
MCHC: 31.8 g/dL (ref 30.0–36.0)
MCV: 70.3 fL — ABNORMAL LOW (ref 80.0–100.0)
Platelets: 409 10*3/uL — ABNORMAL HIGH (ref 150–400)
RBC: 5.72 MIL/uL — ABNORMAL HIGH (ref 3.87–5.11)
RDW: 15.7 % — ABNORMAL HIGH (ref 11.5–15.5)
WBC: 10.7 10*3/uL — ABNORMAL HIGH (ref 4.0–10.5)
nRBC: 0 % (ref 0.0–0.2)

## 2024-04-25 LAB — TROPONIN T, HIGH SENSITIVITY: Troponin T High Sensitivity: <6 ng/L (ref 0–19)

## 2024-04-25 LAB — BASIC METABOLIC PANEL WITH GFR
Anion gap: 14 (ref 5–15)
BUN: 10 mg/dL (ref 8–23)
CO2: 22 mmol/L (ref 22–32)
Calcium: 9.4 mg/dL (ref 8.9–10.3)
Chloride: 104 mmol/L (ref 98–111)
Creatinine, Ser: 0.96 mg/dL (ref 0.44–1.00)
GFR, Estimated: >60 mL/min
Glucose, Bld: 111 mg/dL — ABNORMAL HIGH (ref 70–99)
Potassium: 4 mmol/L (ref 3.5–5.1)
Sodium: 139 mmol/L (ref 135–145)

## 2024-04-25 MED ORDER — ALUM & MAG HYDROXIDE-SIMETH 200-200-20 MG/5ML PO SUSP
30.0000 mL | Freq: Once | ORAL | Status: AC
  Administered 2024-04-25: 30 mL via ORAL
  Filled 2024-04-25: qty 30

## 2024-04-25 MED ORDER — SUCRALFATE 1 G PO TABS: 1.0000 g | ORAL_TABLET | Freq: Three times a day (TID) | ORAL | 0 refills | Status: AC

## 2024-04-25 MED ORDER — IOHEXOL 300 MG/ML  SOLN
75.0000 mL | Freq: Once | INTRAMUSCULAR | Status: AC | PRN
  Administered 2024-04-25: 75 mL via INTRAVENOUS

## 2024-04-25 NOTE — ED Notes (Signed)
 Patient transported to X-ray

## 2024-04-25 NOTE — ED Triage Notes (Signed)
 C/o chest pressure since last night bilaterally radiating to . Pain worse with coughing & deep breathing, states some shortness of breath. Swelling to right neck. Right eyelid pain.

## 2024-04-25 NOTE — ED Notes (Signed)
 ED Provider at bedside.

## 2024-04-25 NOTE — Discharge Instructions (Addendum)
 Please read and follow all provided instructions.  Your diagnoses today include:  1. Precordial pain   2. Edema of chest wall   3. Pulmonary nodule   4. Pleural effusion     Tests performed today include: An EKG of your heart: Showed normal heart rhythm without signs of stress on the heart A chest x-ray: No signs of pneumonia or other issues Cardiac enzymes - a blood test for heart muscle damage, was normal, no sign of heart attack Blood counts and electrolytes: No concerning findings CT scan of the chest with contrast shows nonspecific swelling of the soft tissue in the right upper chest and into the muscle.  There are no signs of a mass or tumor, fluid collection, or other concerning finding at this time.  Please have this monitored by your doctor.  Additionally you have a few small pulmonary nodules (spots in the lungs).  The radiologist recommends optional reimaging in approximately 12 months.  You can discuss this with your doctor.  You also have a small amount of extra fluid in the base of the right lung called a pleural effusion.  If you have worsening shortness of breath or cough, please follow-up with your doctor.  This is not likely related to the burning pain in your chest today. Vital signs. See below for your results today.   Medications prescribed:  Carafate  - for stomach upset and to protect your stomach  Take any prescribed medications only as directed.  Follow-up instructions: Please follow-up with your primary care provider in 1 week for reassessment of your symptoms.  Home instructions: You may use ice or heat on the areas of the chest wall that are swollen.  Please continue over-the-counter medication as needed for chest burning including Tums or Maalox.  Please continue home pantoprazole .  Try to adhere to a bland diet for several days and avoid eating anything particularly spicy or greasy to allow the lining of the esophagus and stomach to heal.  You may try prescribed  Carafate  to see if this helps as well.  Return instructions:  SEEK IMMEDIATE MEDICAL ATTENTION IF: You have severe chest pain, especially if the pain is crushing or pressure-like and spreads to the arms, back, neck, or jaw, or if you have sweating, nausea or vomiting, or trouble with breathing. THIS IS AN EMERGENCY. Do not wait to see if the pain will go away. Get medical help at once. Call 911. DO NOT drive yourself to the hospital.  Your chest pain gets worse and does not go away after a few minutes of rest.  You have an attack of chest pain lasting longer than what you usually experience.  You have significant dizziness, if you pass out, or have trouble walking.  You have chest pain not typical of your usual pain for which you originally saw your caregiver.  You have any other emergent concerns regarding your health.  Additional Information: Chest pain comes from many different causes. Your caregiver has diagnosed you as having chest pain that is not specific for one problem, but does not require admission.  You are at low risk for an acute heart condition or other serious illness.   Your vital signs today were: BP (!) 143/70   Pulse (!) 56   Temp 99.1 F (37.3 C) (Oral)   Resp 11   Ht 5' 2 (1.575 m)   Wt 74.8 kg   SpO2 94%   BMI 30.18 kg/m  If your blood pressure (BP) was elevated  above 135/85 this visit, please have this repeated by your doctor within one month. --------------

## 2024-04-25 NOTE — ED Notes (Signed)
 Patient transported to CT

## 2024-04-25 NOTE — Telephone Encounter (Signed)
 Outpatient service line:  Patient calling new onset of chest discomfort and acid like reflux symptoms that started since last night.  She reported burping and symptoms worsen when laying flat.  Reported burning sensation.  All symptoms seem consistent with acid reflux.  She had taken some Tums with mild relief.  Chronically on pantoprazole  but continues to have persistent symptoms even till this morning.  Of note she has had prior cardiac catheterization 07/2023 that demonstrated no progression of CAD, 2018 stent was widely patent on that cardiac catheterization.  Nonetheless, given persistent symptoms and inability to assess patient, she was recommended to seek evaluation at the ED to rule out acute MI.

## 2024-04-26 ENCOUNTER — Ambulatory Visit

## 2024-04-26 ENCOUNTER — Telehealth: Payer: Self-pay

## 2024-04-26 VITALS — Ht 62.0 in | Wt 165.0 lb

## 2024-04-26 DIAGNOSIS — Z860100 Personal history of colon polyps, unspecified: Secondary | ICD-10-CM

## 2024-04-26 MED ORDER — NA SULFATE-K SULFATE-MG SULF 17.5-3.13-1.6 GM/177ML PO SOLN: 1.0000 | Freq: Once | ORAL | 0 refills | Status: AC

## 2024-04-26 NOTE — Telephone Encounter (Signed)
 Hello Dr. Federico and Norleen,  I just completed a PV for this patient who is scheduled for a colonoscopy at the [facility] on 2/11, cleared by anesthesia.  However, she told me she went to the ED yesterday for chest pain and SOB.  Please review ED report and advise if she is still cleared for [facility] colonoscopy.  Imaging showed a small, right pleural effusion, she did make an appt w/ her PCP for tomorrow, 1/29.  She denies any chest pain, SOB today. Thank you, Erina Hamme

## 2024-04-26 NOTE — Progress Notes (Signed)
 No egg or soy allergy known to patient  No issues known to pt with past sedation with any surgeries or procedures Patient denies ever being told they had issues or difficulty with intubation  No FH of Malignant Hyperthermia  Pt is not on diet pills ( Not currently on Ozempic  and states she will not restart this until after colonoscopy)  Pt is not on  home 02  Pt is not on blood thinners  Pt denies issues with constipation  No A fib or A flutter Have any cardiac testing pending--No Pt can ambulate  Pt denies use of chewing tobacco Discussed diabetic I weight loss medication holds Discussed NSAID holds Checked BMI Pt instructed to use Singlecare.com or GoodRx for a price reduction on prep  Patient's chart reviewed by Norleen Schillings CNRA prior to previsit and patient appropriate for the [facility].  Pre visit completed and red dot placed by patient's name on their procedure day (on provider's schedule).

## 2024-04-27 ENCOUNTER — Ambulatory Visit: Payer: Self-pay

## 2024-04-27 VITALS — BP 139/59 | HR 63 | Ht 62.0 in | Wt 169.8 lb

## 2024-04-27 DIAGNOSIS — R600 Localized edema: Secondary | ICD-10-CM | POA: Diagnosis not present

## 2024-04-27 DIAGNOSIS — R9389 Abnormal findings on diagnostic imaging of other specified body structures: Secondary | ICD-10-CM | POA: Diagnosis not present

## 2024-04-27 NOTE — Addendum Note (Signed)
 Addended by: ORIE MILDA CROME on: 04/27/2024 02:50 PM   Modules accepted: Orders

## 2024-04-27 NOTE — Patient Instructions (Signed)
" ° °  It was great to see you!  Our plans for today:  - If recurrent chest pain and SOB pls visit ED or call 911 - If your noticed worsening fluid,SOB, increase Work of breathing, legs become swollen please call to make an early appointment with your cardiology - CT Chest in 1 years to follow up with nodules   Take care and seek immediate care sooner if you develop any concerns.       Houston Coralee HAS PGY 1 Family Medicine Resident [facility]  [address] [place], [place] [address] Fax [phone] Phone [phone] 04/27/2024, 2:02 PM  "

## 2024-04-27 NOTE — Progress Notes (Signed)
" ° ° °  SUBJECTIVE:   CHIEF COMPLAINT / HPI:   [name] [name] 74 y.o. female present at [facility] clinic today with the following concern   Abnormal CT Chest Pt states experiencing Chest pain a few days ago which prompt her to visit Emergency Room w/ concern for having a heart attack. While the MI work up was negative and her chest pain was most likely d/t her GERD. CT chest w/ contrast in ED shows scattered subpleural pulmonary nodules which pt is very concerned for lung ca. Pt denies SOB, Chest pain, unexplained weight lose, or cough up blood.   Edema Pt also experiencing some bump on the base of her right neck. She is also c/o trace right pleural effusion on CXR in ED. She is not currently on diuresis. Denies SOB or inc WOB. Denies s/s of infection, no fever at home  PERTINENT  PMH / PSH: Per chart   OBJECTIVE:   BP (!) 139/59   Pulse 63   Ht 5' 2 (1.575 m)   Wt 169 lb 12.8 oz (77 kg)   SpO2 96%   BMI 31.06 kg/m   Physical Exam Constitutional:      Appearance: Normal appearance.  Neck:     Vascular: JVD present.  Cardiovascular:     Rate and Rhythm: Normal rate.     Pulses: Normal pulses.  Pulmonary:     Effort: Pulmonary effort is normal.     Comments: Diminished at base bilaterally  Musculoskeletal:     Cervical back: Edema present.     Right lower leg: 1+ Pitting Edema present.     Left lower leg: 1+ Pitting Edema present.  Neurological:     Mental Status: She is alert.      ASSESSMENT/PLAN:   Assessment & Plan Localized edema Mild edema on posterior right neck base and lower extremities. Pt w/o distress on RA. Last Echo on 9/25 w/ no significant finding - Advise pt to contact her cardiology if her edema become worsen for possible repeat Echo Abnormal CT of the chest Discuss CT Chest finding with the patient. Likely benign finding as she is not a smoker, no unintended weight lose, denies SOB - Repeat Chest CT in 1 year   Follow up as needed  Houston Samuels,  DO PGY 1 Family medicine resident  [facility] "

## 2024-05-10 ENCOUNTER — Encounter: Admitting: Internal Medicine

## 2024-10-11 ENCOUNTER — Ambulatory Visit: Admitting: Neurology
